# Patient Record
Sex: Female | Born: 1965 | Race: White | Hispanic: No | Marital: Married | Smoking: Current some day smoker
Health system: Southern US, Community
[De-identification: ages and names within clinical notes are randomized; demographics above are authoritative.]

## PROBLEM LIST (undated history)

## (undated) DIAGNOSIS — G43909 Migraine, unspecified, not intractable, without status migrainosus: Secondary | ICD-10-CM

## (undated) DIAGNOSIS — I499 Cardiac arrhythmia, unspecified: Secondary | ICD-10-CM

## (undated) DIAGNOSIS — K222 Esophageal obstruction: Secondary | ICD-10-CM

## (undated) DIAGNOSIS — R112 Nausea with vomiting, unspecified: Secondary | ICD-10-CM

## (undated) DIAGNOSIS — R011 Cardiac murmur, unspecified: Secondary | ICD-10-CM

## (undated) DIAGNOSIS — F419 Anxiety disorder, unspecified: Secondary | ICD-10-CM

## (undated) DIAGNOSIS — M1990 Unspecified osteoarthritis, unspecified site: Secondary | ICD-10-CM

## (undated) DIAGNOSIS — T7840XA Allergy, unspecified, initial encounter: Secondary | ICD-10-CM

## (undated) DIAGNOSIS — J189 Pneumonia, unspecified organism: Secondary | ICD-10-CM

## (undated) DIAGNOSIS — Z9889 Other specified postprocedural states: Secondary | ICD-10-CM

## (undated) DIAGNOSIS — J45909 Unspecified asthma, uncomplicated: Secondary | ICD-10-CM

## (undated) DIAGNOSIS — I341 Nonrheumatic mitral (valve) prolapse: Secondary | ICD-10-CM

## (undated) DIAGNOSIS — K589 Irritable bowel syndrome without diarrhea: Secondary | ICD-10-CM

## (undated) DIAGNOSIS — K279 Peptic ulcer, site unspecified, unspecified as acute or chronic, without hemorrhage or perforation: Secondary | ICD-10-CM

## (undated) DIAGNOSIS — D649 Anemia, unspecified: Secondary | ICD-10-CM

## (undated) DIAGNOSIS — F909 Attention-deficit hyperactivity disorder, unspecified type: Secondary | ICD-10-CM

## (undated) DIAGNOSIS — K219 Gastro-esophageal reflux disease without esophagitis: Secondary | ICD-10-CM

## (undated) DIAGNOSIS — T8859XA Other complications of anesthesia, initial encounter: Secondary | ICD-10-CM

## (undated) DIAGNOSIS — Z8719 Personal history of other diseases of the digestive system: Secondary | ICD-10-CM

## (undated) DIAGNOSIS — C4490 Unspecified malignant neoplasm of skin, unspecified: Secondary | ICD-10-CM

## (undated) DIAGNOSIS — Z923 Personal history of irradiation: Secondary | ICD-10-CM

## (undated) DIAGNOSIS — F32A Depression, unspecified: Secondary | ICD-10-CM

## (undated) DIAGNOSIS — C50919 Malignant neoplasm of unspecified site of unspecified female breast: Secondary | ICD-10-CM

## (undated) DIAGNOSIS — F329 Major depressive disorder, single episode, unspecified: Secondary | ICD-10-CM

## (undated) DIAGNOSIS — T4145XA Adverse effect of unspecified anesthetic, initial encounter: Secondary | ICD-10-CM

## (undated) HISTORY — PX: SHOULDER SURGERY: SHX246

## (undated) HISTORY — PX: CERVIX LESION DESTRUCTION: SHX591

## (undated) HISTORY — PX: CYSTECTOMY: SUR359

## (undated) HISTORY — DX: Cardiac murmur, unspecified: R01.1

## (undated) HISTORY — DX: Gastro-esophageal reflux disease without esophagitis: K21.9

## (undated) HISTORY — PX: HAND SURGERY: SHX662

## (undated) HISTORY — DX: Migraine, unspecified, not intractable, without status migrainosus: G43.909

## (undated) HISTORY — PX: TONSILLECTOMY: SUR1361

## (undated) HISTORY — PX: DILATION AND CURETTAGE OF UTERUS: SHX78

## (undated) HISTORY — PX: NOVASURE ABLATION: SHX5394

## (undated) HISTORY — PX: COLPOSCOPY: SHX161

## (undated) HISTORY — DX: Unspecified osteoarthritis, unspecified site: M19.90

## (undated) HISTORY — DX: Allergy, unspecified, initial encounter: T78.40XA

## (undated) HISTORY — PX: APPENDECTOMY: SHX54

## (undated) HISTORY — PX: BREAST LUMPECTOMY: SHX2

---

## 1998-02-23 ENCOUNTER — Other Ambulatory Visit: Admission: RE | Admit: 1998-02-23 | Discharge: 1998-02-23 | Payer: Self-pay | Admitting: Gynecology

## 1998-10-24 ENCOUNTER — Other Ambulatory Visit: Admission: RE | Admit: 1998-10-24 | Discharge: 1998-10-24 | Payer: Self-pay | Admitting: Gynecology

## 1998-11-27 ENCOUNTER — Emergency Department (HOSPITAL_COMMUNITY): Admission: EM | Admit: 1998-11-27 | Discharge: 1998-11-27 | Payer: Self-pay | Admitting: Emergency Medicine

## 1999-05-18 ENCOUNTER — Ambulatory Visit (HOSPITAL_COMMUNITY): Admission: RE | Admit: 1999-05-18 | Discharge: 1999-05-18 | Payer: Self-pay | Admitting: Obstetrics and Gynecology

## 1999-05-18 ENCOUNTER — Encounter: Payer: Self-pay | Admitting: Obstetrics and Gynecology

## 1999-05-19 ENCOUNTER — Ambulatory Visit (HOSPITAL_COMMUNITY): Admission: AD | Admit: 1999-05-19 | Discharge: 1999-05-19 | Payer: Self-pay | Admitting: Obstetrics and Gynecology

## 1999-05-19 ENCOUNTER — Encounter (INDEPENDENT_AMBULATORY_CARE_PROVIDER_SITE_OTHER): Payer: Self-pay

## 1999-05-22 ENCOUNTER — Emergency Department (HOSPITAL_COMMUNITY): Admission: EM | Admit: 1999-05-22 | Discharge: 1999-05-22 | Payer: Self-pay | Admitting: Emergency Medicine

## 1999-06-20 ENCOUNTER — Emergency Department (HOSPITAL_COMMUNITY): Admission: EM | Admit: 1999-06-20 | Discharge: 1999-06-21 | Payer: Self-pay | Admitting: *Deleted

## 1999-06-28 ENCOUNTER — Ambulatory Visit (HOSPITAL_COMMUNITY): Admission: RE | Admit: 1999-06-28 | Discharge: 1999-06-28 | Payer: Self-pay | Admitting: Internal Medicine

## 1999-10-25 ENCOUNTER — Other Ambulatory Visit: Admission: RE | Admit: 1999-10-25 | Discharge: 1999-10-26 | Payer: Self-pay | Admitting: Addiction Psychiatry

## 1999-11-06 ENCOUNTER — Other Ambulatory Visit: Admission: RE | Admit: 1999-11-06 | Discharge: 1999-11-06 | Payer: Self-pay | Admitting: Obstetrics and Gynecology

## 1999-11-21 ENCOUNTER — Emergency Department (HOSPITAL_COMMUNITY): Admission: EM | Admit: 1999-11-21 | Discharge: 1999-11-22 | Payer: Self-pay | Admitting: Emergency Medicine

## 1999-11-22 ENCOUNTER — Emergency Department (HOSPITAL_COMMUNITY): Admission: EM | Admit: 1999-11-22 | Discharge: 1999-11-22 | Payer: Self-pay | Admitting: Emergency Medicine

## 1999-11-22 ENCOUNTER — Encounter: Payer: Self-pay | Admitting: Emergency Medicine

## 1999-11-29 ENCOUNTER — Encounter: Payer: Self-pay | Admitting: Internal Medicine

## 1999-11-29 ENCOUNTER — Ambulatory Visit (HOSPITAL_COMMUNITY): Admission: RE | Admit: 1999-11-29 | Discharge: 1999-11-29 | Payer: Self-pay | Admitting: Internal Medicine

## 1999-12-06 ENCOUNTER — Encounter (INDEPENDENT_AMBULATORY_CARE_PROVIDER_SITE_OTHER): Payer: Self-pay | Admitting: Specialist

## 1999-12-06 ENCOUNTER — Other Ambulatory Visit: Admission: RE | Admit: 1999-12-06 | Discharge: 1999-12-06 | Payer: Self-pay | Admitting: Internal Medicine

## 2000-02-07 ENCOUNTER — Emergency Department (HOSPITAL_COMMUNITY): Admission: EM | Admit: 2000-02-07 | Discharge: 2000-02-07 | Payer: Self-pay | Admitting: Emergency Medicine

## 2000-02-12 ENCOUNTER — Emergency Department (HOSPITAL_COMMUNITY): Admission: EM | Admit: 2000-02-12 | Discharge: 2000-02-12 | Payer: Self-pay | Admitting: Emergency Medicine

## 2000-02-13 ENCOUNTER — Observation Stay (HOSPITAL_COMMUNITY): Admission: EM | Admit: 2000-02-13 | Discharge: 2000-02-14 | Payer: Self-pay | Admitting: Emergency Medicine

## 2000-09-04 ENCOUNTER — Encounter: Payer: Self-pay | Admitting: Obstetrics and Gynecology

## 2000-09-04 ENCOUNTER — Inpatient Hospital Stay (HOSPITAL_COMMUNITY): Admission: AD | Admit: 2000-09-04 | Discharge: 2000-09-04 | Payer: Self-pay | Admitting: Obstetrics and Gynecology

## 2000-09-09 ENCOUNTER — Inpatient Hospital Stay (HOSPITAL_COMMUNITY): Admission: AD | Admit: 2000-09-09 | Discharge: 2000-09-09 | Payer: Self-pay | Admitting: Obstetrics and Gynecology

## 2000-11-25 ENCOUNTER — Ambulatory Visit (HOSPITAL_COMMUNITY): Admission: RE | Admit: 2000-11-25 | Discharge: 2000-11-25 | Payer: Self-pay | Admitting: Obstetrics and Gynecology

## 2000-11-25 ENCOUNTER — Encounter: Payer: Self-pay | Admitting: Obstetrics and Gynecology

## 2001-01-04 ENCOUNTER — Inpatient Hospital Stay (HOSPITAL_COMMUNITY): Admission: AD | Admit: 2001-01-04 | Discharge: 2001-01-04 | Payer: Self-pay | Admitting: Obstetrics and Gynecology

## 2001-01-16 ENCOUNTER — Encounter (INDEPENDENT_AMBULATORY_CARE_PROVIDER_SITE_OTHER): Payer: Self-pay | Admitting: *Deleted

## 2001-01-16 ENCOUNTER — Ambulatory Visit (HOSPITAL_COMMUNITY): Admission: RE | Admit: 2001-01-16 | Discharge: 2001-01-16 | Payer: Self-pay | Admitting: Obstetrics and Gynecology

## 2001-01-16 ENCOUNTER — Encounter: Payer: Self-pay | Admitting: Obstetrics and Gynecology

## 2001-04-20 ENCOUNTER — Inpatient Hospital Stay (HOSPITAL_COMMUNITY): Admission: AD | Admit: 2001-04-20 | Discharge: 2001-04-23 | Payer: Self-pay | Admitting: Obstetrics and Gynecology

## 2001-04-24 ENCOUNTER — Encounter: Admission: RE | Admit: 2001-04-24 | Discharge: 2001-05-06 | Payer: Self-pay | Admitting: Obstetrics and Gynecology

## 2001-05-02 ENCOUNTER — Inpatient Hospital Stay (HOSPITAL_COMMUNITY): Admission: AD | Admit: 2001-05-02 | Discharge: 2001-05-02 | Payer: Self-pay | Admitting: Obstetrics and Gynecology

## 2001-05-18 ENCOUNTER — Other Ambulatory Visit: Admission: RE | Admit: 2001-05-18 | Discharge: 2001-05-18 | Payer: Self-pay | Admitting: Obstetrics and Gynecology

## 2001-05-25 ENCOUNTER — Encounter: Admission: RE | Admit: 2001-05-25 | Discharge: 2001-06-24 | Payer: Self-pay | Admitting: Obstetrics and Gynecology

## 2001-06-25 ENCOUNTER — Encounter: Admission: RE | Admit: 2001-06-25 | Discharge: 2001-07-25 | Payer: Self-pay | Admitting: Obstetrics and Gynecology

## 2002-04-22 ENCOUNTER — Other Ambulatory Visit: Admission: RE | Admit: 2002-04-22 | Discharge: 2002-04-22 | Payer: Self-pay | Admitting: Obstetrics and Gynecology

## 2002-05-09 ENCOUNTER — Emergency Department (HOSPITAL_COMMUNITY): Admission: EM | Admit: 2002-05-09 | Discharge: 2002-05-09 | Payer: Self-pay | Admitting: Emergency Medicine

## 2002-05-09 ENCOUNTER — Encounter: Payer: Self-pay | Admitting: Emergency Medicine

## 2002-08-04 ENCOUNTER — Emergency Department (HOSPITAL_COMMUNITY): Admission: EM | Admit: 2002-08-04 | Discharge: 2002-08-04 | Payer: Self-pay | Admitting: Emergency Medicine

## 2002-10-24 ENCOUNTER — Emergency Department (HOSPITAL_COMMUNITY): Admission: EM | Admit: 2002-10-24 | Discharge: 2002-10-24 | Payer: Self-pay | Admitting: Emergency Medicine

## 2002-12-19 ENCOUNTER — Encounter: Payer: Self-pay | Admitting: Emergency Medicine

## 2002-12-19 ENCOUNTER — Emergency Department (HOSPITAL_COMMUNITY): Admission: EM | Admit: 2002-12-19 | Discharge: 2002-12-19 | Payer: Self-pay | Admitting: Emergency Medicine

## 2002-12-22 ENCOUNTER — Emergency Department (HOSPITAL_COMMUNITY): Admission: EM | Admit: 2002-12-22 | Discharge: 2002-12-22 | Payer: Self-pay | Admitting: Emergency Medicine

## 2003-01-04 ENCOUNTER — Encounter: Payer: Self-pay | Admitting: Emergency Medicine

## 2003-01-04 ENCOUNTER — Emergency Department (HOSPITAL_COMMUNITY): Admission: EM | Admit: 2003-01-04 | Discharge: 2003-01-04 | Payer: Self-pay | Admitting: Emergency Medicine

## 2003-05-03 ENCOUNTER — Other Ambulatory Visit: Admission: RE | Admit: 2003-05-03 | Discharge: 2003-05-03 | Payer: Self-pay | Admitting: Obstetrics and Gynecology

## 2003-07-12 ENCOUNTER — Ambulatory Visit (HOSPITAL_COMMUNITY): Admission: RE | Admit: 2003-07-12 | Discharge: 2003-07-12 | Payer: Self-pay | Admitting: Internal Medicine

## 2003-11-25 ENCOUNTER — Ambulatory Visit (HOSPITAL_COMMUNITY): Admission: RE | Admit: 2003-11-25 | Discharge: 2003-11-25 | Payer: Self-pay | Admitting: Internal Medicine

## 2003-11-25 ENCOUNTER — Encounter (INDEPENDENT_AMBULATORY_CARE_PROVIDER_SITE_OTHER): Payer: Self-pay | Admitting: *Deleted

## 2003-11-30 ENCOUNTER — Ambulatory Visit (HOSPITAL_COMMUNITY): Admission: RE | Admit: 2003-11-30 | Discharge: 2003-11-30 | Payer: Self-pay | Admitting: Internal Medicine

## 2004-05-17 ENCOUNTER — Other Ambulatory Visit: Admission: RE | Admit: 2004-05-17 | Discharge: 2004-05-17 | Payer: Self-pay | Admitting: Obstetrics and Gynecology

## 2005-05-27 ENCOUNTER — Other Ambulatory Visit: Admission: RE | Admit: 2005-05-27 | Discharge: 2005-05-27 | Payer: Self-pay | Admitting: Obstetrics and Gynecology

## 2005-07-03 ENCOUNTER — Ambulatory Visit: Payer: Self-pay | Admitting: Internal Medicine

## 2005-07-19 ENCOUNTER — Ambulatory Visit: Payer: Self-pay | Admitting: Internal Medicine

## 2005-07-23 ENCOUNTER — Ambulatory Visit: Payer: Self-pay | Admitting: Internal Medicine

## 2005-07-29 ENCOUNTER — Ambulatory Visit: Payer: Self-pay | Admitting: Internal Medicine

## 2005-08-14 ENCOUNTER — Ambulatory Visit: Payer: Self-pay | Admitting: Internal Medicine

## 2005-09-18 ENCOUNTER — Ambulatory Visit: Payer: Self-pay | Admitting: Internal Medicine

## 2005-11-19 ENCOUNTER — Ambulatory Visit: Payer: Self-pay | Admitting: Internal Medicine

## 2005-12-12 ENCOUNTER — Ambulatory Visit: Payer: Self-pay | Admitting: Internal Medicine

## 2006-04-22 ENCOUNTER — Ambulatory Visit: Payer: Self-pay | Admitting: Internal Medicine

## 2006-06-27 ENCOUNTER — Other Ambulatory Visit: Payer: Self-pay

## 2006-06-27 ENCOUNTER — Emergency Department: Payer: Self-pay | Admitting: Emergency Medicine

## 2006-06-30 ENCOUNTER — Ambulatory Visit: Payer: Self-pay | Admitting: Internal Medicine

## 2006-06-30 ENCOUNTER — Ambulatory Visit: Payer: Self-pay | Admitting: Emergency Medicine

## 2006-07-07 ENCOUNTER — Ambulatory Visit: Payer: Self-pay | Admitting: Internal Medicine

## 2006-10-02 ENCOUNTER — Ambulatory Visit: Payer: Self-pay | Admitting: Internal Medicine

## 2006-12-22 ENCOUNTER — Ambulatory Visit: Payer: Self-pay | Admitting: Internal Medicine

## 2007-01-21 ENCOUNTER — Ambulatory Visit: Payer: Self-pay | Admitting: Internal Medicine

## 2007-01-30 ENCOUNTER — Ambulatory Visit: Payer: Self-pay | Admitting: Internal Medicine

## 2007-05-01 DIAGNOSIS — F411 Generalized anxiety disorder: Secondary | ICD-10-CM

## 2007-05-01 DIAGNOSIS — I4949 Other premature depolarization: Secondary | ICD-10-CM | POA: Insufficient documentation

## 2007-05-01 DIAGNOSIS — F419 Anxiety disorder, unspecified: Secondary | ICD-10-CM | POA: Insufficient documentation

## 2007-05-01 DIAGNOSIS — F32A Depression, unspecified: Secondary | ICD-10-CM | POA: Insufficient documentation

## 2007-05-01 DIAGNOSIS — F329 Major depressive disorder, single episode, unspecified: Secondary | ICD-10-CM

## 2007-05-01 DIAGNOSIS — E669 Obesity, unspecified: Secondary | ICD-10-CM | POA: Insufficient documentation

## 2007-05-01 DIAGNOSIS — F410 Panic disorder [episodic paroxysmal anxiety] without agoraphobia: Secondary | ICD-10-CM | POA: Insufficient documentation

## 2007-05-01 DIAGNOSIS — J309 Allergic rhinitis, unspecified: Secondary | ICD-10-CM

## 2007-05-01 DIAGNOSIS — Z8679 Personal history of other diseases of the circulatory system: Secondary | ICD-10-CM | POA: Insufficient documentation

## 2007-05-01 DIAGNOSIS — Z8711 Personal history of peptic ulcer disease: Secondary | ICD-10-CM

## 2007-07-02 ENCOUNTER — Encounter: Payer: Self-pay | Admitting: Internal Medicine

## 2007-07-02 ENCOUNTER — Ambulatory Visit: Payer: Self-pay | Admitting: Internal Medicine

## 2007-07-06 ENCOUNTER — Other Ambulatory Visit (HOSPITAL_COMMUNITY): Admission: RE | Admit: 2007-07-06 | Discharge: 2007-10-04 | Payer: Self-pay | Admitting: Psychiatry

## 2007-07-07 ENCOUNTER — Ambulatory Visit: Payer: Self-pay | Admitting: Psychiatry

## 2007-08-22 ENCOUNTER — Emergency Department (HOSPITAL_COMMUNITY): Admission: EM | Admit: 2007-08-22 | Discharge: 2007-08-22 | Payer: Self-pay | Admitting: Family Medicine

## 2007-08-27 ENCOUNTER — Telehealth: Payer: Self-pay | Admitting: Internal Medicine

## 2007-08-27 ENCOUNTER — Emergency Department (HOSPITAL_COMMUNITY): Admission: EM | Admit: 2007-08-27 | Discharge: 2007-08-27 | Payer: Self-pay | Admitting: Emergency Medicine

## 2007-08-27 ENCOUNTER — Encounter (INDEPENDENT_AMBULATORY_CARE_PROVIDER_SITE_OTHER): Payer: Self-pay | Admitting: *Deleted

## 2007-09-15 ENCOUNTER — Ambulatory Visit: Payer: Self-pay | Admitting: Internal Medicine

## 2007-09-15 ENCOUNTER — Encounter: Payer: Self-pay | Admitting: Internal Medicine

## 2007-09-18 ENCOUNTER — Encounter: Payer: Self-pay | Admitting: Internal Medicine

## 2007-09-21 ENCOUNTER — Ambulatory Visit: Payer: Self-pay | Admitting: Internal Medicine

## 2007-10-20 ENCOUNTER — Ambulatory Visit: Payer: Self-pay | Admitting: Internal Medicine

## 2007-10-20 ENCOUNTER — Encounter: Payer: Self-pay | Admitting: Internal Medicine

## 2007-11-05 ENCOUNTER — Ambulatory Visit: Payer: Self-pay | Admitting: Internal Medicine

## 2007-11-05 LAB — CONVERTED CEMR LAB
Bilirubin Urine: NEGATIVE
Urine Glucose: NEGATIVE mg/dL

## 2007-11-10 ENCOUNTER — Ambulatory Visit (HOSPITAL_COMMUNITY): Admission: RE | Admit: 2007-11-10 | Discharge: 2007-11-10 | Payer: Self-pay | Admitting: Internal Medicine

## 2007-12-12 ENCOUNTER — Emergency Department (HOSPITAL_COMMUNITY): Admission: EM | Admit: 2007-12-12 | Discharge: 2007-12-12 | Payer: Self-pay | Admitting: Emergency Medicine

## 2008-02-08 ENCOUNTER — Telehealth: Payer: Self-pay | Admitting: Internal Medicine

## 2008-02-09 ENCOUNTER — Ambulatory Visit: Payer: Self-pay | Admitting: Internal Medicine

## 2008-02-09 LAB — CONVERTED CEMR LAB
Hemoglobin, Urine: NEGATIVE
Ketones, ur: NEGATIVE mg/dL
Leukocytes, UA: NEGATIVE
Nitrite: NEGATIVE
Urine Glucose: NEGATIVE mg/dL

## 2008-02-10 ENCOUNTER — Telehealth: Payer: Self-pay | Admitting: Internal Medicine

## 2008-03-11 ENCOUNTER — Ambulatory Visit: Payer: Self-pay | Admitting: Internal Medicine

## 2008-03-11 DIAGNOSIS — K279 Peptic ulcer, site unspecified, unspecified as acute or chronic, without hemorrhage or perforation: Secondary | ICD-10-CM | POA: Insufficient documentation

## 2008-03-11 DIAGNOSIS — M2550 Pain in unspecified joint: Secondary | ICD-10-CM

## 2008-03-11 DIAGNOSIS — K222 Esophageal obstruction: Secondary | ICD-10-CM

## 2008-05-13 ENCOUNTER — Ambulatory Visit: Payer: Self-pay | Admitting: Internal Medicine

## 2008-05-13 ENCOUNTER — Telehealth: Payer: Self-pay | Admitting: Internal Medicine

## 2008-05-17 ENCOUNTER — Telehealth: Payer: Self-pay | Admitting: Internal Medicine

## 2008-05-18 ENCOUNTER — Ambulatory Visit: Payer: Self-pay | Admitting: Internal Medicine

## 2008-05-18 ENCOUNTER — Telehealth: Payer: Self-pay | Admitting: Internal Medicine

## 2008-05-19 ENCOUNTER — Telehealth: Payer: Self-pay | Admitting: Internal Medicine

## 2008-05-27 ENCOUNTER — Ambulatory Visit: Payer: Self-pay | Admitting: Internal Medicine

## 2008-05-27 ENCOUNTER — Telehealth: Payer: Self-pay | Admitting: Internal Medicine

## 2008-06-14 ENCOUNTER — Telehealth: Payer: Self-pay | Admitting: Internal Medicine

## 2008-06-21 ENCOUNTER — Encounter: Payer: Self-pay | Admitting: Internal Medicine

## 2008-08-19 ENCOUNTER — Ambulatory Visit: Payer: Self-pay | Admitting: Internal Medicine

## 2008-08-23 ENCOUNTER — Ambulatory Visit: Payer: Self-pay | Admitting: Internal Medicine

## 2008-08-23 DIAGNOSIS — G43909 Migraine, unspecified, not intractable, without status migrainosus: Secondary | ICD-10-CM | POA: Insufficient documentation

## 2008-08-23 LAB — CONVERTED CEMR LAB
ALT: 13 units/L (ref 0–35)
AST: 19 units/L (ref 0–37)
Bilirubin, Direct: 0.1 mg/dL (ref 0.0–0.3)
Eosinophils Relative: 1.2 % (ref 0.0–5.0)
HCT: 39.9 % (ref 36.0–46.0)
HDL: 52.7 mg/dL (ref >39.0)
Hemoglobin: 13.7 g/dL (ref 12.0–15.0)
LDL Cholesterol: 116 mg/dL — ABNORMAL HIGH (ref 0–99)
Lymphocytes Relative: 28.9 % (ref 12.0–46.0)
Monocytes Relative: 6 % (ref 3.0–12.0)
Platelets: 217 10*3/uL (ref 150–400)
RDW: 12.1 % (ref 11.5–14.6)
Total Bilirubin: 0.7 mg/dL (ref 0.3–1.2)
Total CHOL/HDL Ratio: 3.4
Total Protein: 6.9 g/dL (ref 6.0–8.3)
Triglycerides: 47 mg/dL (ref 0–149)
WBC: 5.3 10*3/uL (ref 4.5–10.5)

## 2008-08-25 ENCOUNTER — Encounter: Payer: Self-pay | Admitting: Internal Medicine

## 2008-08-25 ENCOUNTER — Ambulatory Visit: Payer: Self-pay

## 2008-09-21 ENCOUNTER — Telehealth: Payer: Self-pay | Admitting: Internal Medicine

## 2008-09-27 ENCOUNTER — Ambulatory Visit: Payer: Self-pay | Admitting: Internal Medicine

## 2008-09-27 ENCOUNTER — Telehealth: Payer: Self-pay | Admitting: Internal Medicine

## 2008-09-27 DIAGNOSIS — J069 Acute upper respiratory infection, unspecified: Secondary | ICD-10-CM | POA: Insufficient documentation

## 2008-12-21 ENCOUNTER — Telehealth (INDEPENDENT_AMBULATORY_CARE_PROVIDER_SITE_OTHER): Payer: Self-pay | Admitting: *Deleted

## 2008-12-23 ENCOUNTER — Ambulatory Visit: Payer: Self-pay | Admitting: Internal Medicine

## 2008-12-23 DIAGNOSIS — G245 Blepharospasm: Secondary | ICD-10-CM | POA: Insufficient documentation

## 2008-12-27 ENCOUNTER — Telehealth (INDEPENDENT_AMBULATORY_CARE_PROVIDER_SITE_OTHER): Payer: Self-pay | Admitting: *Deleted

## 2009-05-18 ENCOUNTER — Encounter: Payer: Self-pay | Admitting: Internal Medicine

## 2009-09-11 ENCOUNTER — Telehealth: Payer: Self-pay | Admitting: Internal Medicine

## 2009-11-02 ENCOUNTER — Ambulatory Visit: Payer: Self-pay | Admitting: Internal Medicine

## 2009-11-03 ENCOUNTER — Telehealth: Payer: Self-pay | Admitting: Internal Medicine

## 2009-11-06 ENCOUNTER — Encounter: Payer: Self-pay | Admitting: Internal Medicine

## 2009-12-19 ENCOUNTER — Ambulatory Visit: Payer: Self-pay | Admitting: Gastroenterology

## 2009-12-19 ENCOUNTER — Telehealth: Payer: Self-pay | Admitting: Internal Medicine

## 2009-12-19 ENCOUNTER — Encounter: Payer: Self-pay | Admitting: Nurse Practitioner

## 2009-12-19 DIAGNOSIS — K589 Irritable bowel syndrome without diarrhea: Secondary | ICD-10-CM | POA: Insufficient documentation

## 2009-12-19 DIAGNOSIS — K219 Gastro-esophageal reflux disease without esophagitis: Secondary | ICD-10-CM

## 2009-12-21 ENCOUNTER — Telehealth: Payer: Self-pay | Admitting: Nurse Practitioner

## 2010-01-16 ENCOUNTER — Ambulatory Visit (HOSPITAL_COMMUNITY): Admission: RE | Admit: 2010-01-16 | Discharge: 2010-01-16 | Payer: Self-pay | Admitting: Obstetrics and Gynecology

## 2010-01-17 ENCOUNTER — Encounter (INDEPENDENT_AMBULATORY_CARE_PROVIDER_SITE_OTHER): Payer: Self-pay | Admitting: *Deleted

## 2010-01-24 DIAGNOSIS — K449 Diaphragmatic hernia without obstruction or gangrene: Secondary | ICD-10-CM | POA: Insufficient documentation

## 2010-01-24 DIAGNOSIS — K644 Residual hemorrhoidal skin tags: Secondary | ICD-10-CM | POA: Insufficient documentation

## 2010-04-26 ENCOUNTER — Ambulatory Visit: Payer: Self-pay | Admitting: Internal Medicine

## 2010-04-26 DIAGNOSIS — R609 Edema, unspecified: Secondary | ICD-10-CM | POA: Insufficient documentation

## 2010-04-26 LAB — CONVERTED CEMR LAB
BUN: 13 mg/dL (ref 6–23)
Bilirubin Urine: NEGATIVE
CO2: 31 meq/L (ref 19–32)
Calcium: 9.3 mg/dL (ref 8.4–10.5)
Creatinine, Ser: 0.5 mg/dL (ref 0.4–1.2)
GFR calc non Af Amer: 147.89 mL/min (ref >60)
Glucose, Bld: 108 mg/dL — ABNORMAL HIGH (ref 70–99)
Hemoglobin, Urine: NEGATIVE
Ketones, ur: NEGATIVE mg/dL
Specific Gravity, Urine: 1.015 (ref 1.000–1.030)
Urine Glucose: NEGATIVE mg/dL
Urobilinogen, UA: 0.2 (ref 0.0–1.0)

## 2010-04-28 ENCOUNTER — Telehealth: Payer: Self-pay | Admitting: Internal Medicine

## 2010-06-07 ENCOUNTER — Ambulatory Visit: Payer: Self-pay | Admitting: Internal Medicine

## 2010-09-07 ENCOUNTER — Ambulatory Visit: Payer: Self-pay | Admitting: Endocrinology

## 2010-09-07 DIAGNOSIS — M25559 Pain in unspecified hip: Secondary | ICD-10-CM | POA: Insufficient documentation

## 2010-10-10 ENCOUNTER — Ambulatory Visit
Admission: RE | Admit: 2010-10-10 | Discharge: 2010-10-10 | Payer: Self-pay | Source: Home / Self Care | Attending: Internal Medicine | Admitting: Internal Medicine

## 2010-10-28 ENCOUNTER — Encounter: Payer: Self-pay | Admitting: Obstetrics and Gynecology

## 2010-10-28 ENCOUNTER — Encounter: Payer: Self-pay | Admitting: Gastroenterology

## 2010-11-06 NOTE — Progress Notes (Signed)
  Phone Note Outgoing Call   Reason for Call: Discuss lab or test results Summary of Call: please call patient: U/A weakly positive looking more like a poor catch (not a clean catch). Is she symptomatic?  Thanks Initial call taken by: Jacques Navy MD,  April 28, 2010 12:51 PM  Follow-up for Phone Call        pt states she is having a little bit of urinary freq. She is not having burning but states she does feel like she has the urge to go quite a bit. Follow-up by: Ami Bullins CMA,  April 30, 2010 9:33 AM  Additional Follow-up for Phone Call Additional follow up Details #1::        macrobid 100mg  two times a day x 5, #10, no refills Additional Follow-up by: Jacques Navy MD,  April 30, 2010 10:41 AM    New/Updated Medications: MACROBID 100 MG CAPS (NITROFURANTOIN MONOHYD MACRO) 1 cap two times a day x 10 days Prescriptions: MACROBID 100 MG CAPS (NITROFURANTOIN MONOHYD MACRO) 1 cap two times a day x 10 days  #10 x 0   Entered by:   Ami Bullins CMA   Authorized by:   Jacques Navy MD   Signed by:   Bill Salinas CMA on 04/30/2010   Method used:   [facility]  [address] [phone]       [address]       [place], [place]  [id number]       Ph: [phone] or [phone]       Fax: [phone]   RxID:   [id number]

## 2010-11-06 NOTE — Letter (Signed)
Summary: [facility]   Imported By: Sherian Rein 11/29/2009 15:12:33  _____________________________________________________________________  External Attachment:    Type:   Image     Comment:   External Document

## 2010-11-06 NOTE — Procedures (Signed)
Summary: COLON   Colonoscopy  Procedure date:  01/30/2007  Findings:      [facility]:   [facility]    Procedures Next Due Date:    Colonoscopy: 02/2014 Patient Name: Tamara Cross, Tamara l. MRN: [id number] Procedure Procedures: Colonoscopy CPT: [phone].  Personnel: Endoscopist: Felice Hope L. Juanda Chance, MD.  Exam Location: Exam performed in Outpatient Clinic. Outpatient  Patient Consent: Procedure, Alternatives, Risks and Benefits discussed, consent obtained, from patient. Consent was obtained by the RN.  Indications  Surveillance of: 2001.  Increased Risk Screening: For family history of colorectal neoplasia, in  grandparent 2  maternal uncles 91 maternal aunts and uncles had colon polyps Family History of Polyps.  History  Current Medications: Patient is not currently taking Coumadin.  Pre-Exam Physical: Performed Jan 30, 2007. Cardio-pulmonary exam, HEENT exam , Abdominal exam, Extremity exam, Neurological exam, Mental status exam WNL.  Comments: Pt. history reviewed/updated, physical exam performed prior to initiation of sedation? Exam Exam: Extent of exam reached: Cecum, extent intended: Cecum.  The cecum was identified by appendiceal orifice and IC valve. Colon retroflexion performed. Images taken. ASA Classification: I. Tolerance: good.  Monitoring: Pulse and BP monitoring, Oximetry used. Supplemental O2 given.  Colon Prep Used Miralax for colon prep. Prep results: good.  Sedation Meds: Patient assessed and found to be appropriate for moderate (conscious) sedation. Fentanyl 100 mcg. given IV. Versed 9 mg. given IV.  Findings - NORMAL EXAM: to Rectum.  - HEMORRHOIDS: External. Size: Grade I. ICD9: Hemorrhoids, External: 455.3.   Assessment Normal examination.  Diagnoses: 455.3: Hemorrhoids, External.   Comments: no polyps Events  Unplanned Interventions: No intervention was required.  Unplanned Events: There were no complications. Plans Medication  Plan: Analpram cream 2.5 %: prn, starting Jan 30, 2007   Patient Education: Patient given standard instructions for: Patient instructed to get routine colonoscopy every 7 years.  Disposition: After procedure patient sent to recovery. After recovery patient sent home.   This report was created from the original endoscopy report, which was reviewed and signed by the above listed endoscopist.

## 2010-11-06 NOTE — Assessment & Plan Note (Signed)
Summary: pain left hip/groin area pain,lump/men/cd   Vital Signs:  Patient profile:   46 year old female Height:      65 inches (165.10 cm) Weight:      203 pounds (92.27 kg) BMI:     33.90 O2 Sat:      96 % on Room air Temp:     98.4 degrees F (36.89 degrees C) oral Pulse rate:   77 / minute Pulse rhythm:   regular BP sitting:   104 / 68  (left arm) Cuff size:   regular  Vitals Entered By: Brenton Grills CMA Duncan Dull) (September 07, 2010 3:16 PM)  O2 Flow:  Room air CC: Left hip pain, groin pain on left side x 2 weeks, Bump in groin area/OTC meds not helping/aj Is Patient Diabetic? No Comments pt is no longer taking Marlan Palau, or Amitiza   Primary Provider:  Illene Regulus, MD  CC:  Left hip pain, groin pain on left side x 2 weeks, and Bump in groin area/OTC meds not helping/aj.  History of Present Illness: 2 weeks of intermittent moderate pain at the left anterior thigh, and 2 assoc "bumps,"  there.  no injury.  pain is exacerbated by crossing left leg, or by putting weight on the left lower extremity.  no help with tylenol, advil, aleve, or motrin.    Current Medications (verified): 1)  Toprol Xl 50 Mg  Tb24 (Metoprolol Succinate) .... Once Daily 2)  Astelin 137 Mcg/spray  Soln (Azelastine Hcl) .... Once Daily As Needed 3)  Nasonex 50 Mcg/act  Susp (Mometasone Furoate) .... 2 Sprays Once Daily As Needed 4)  Miralax   Powd (Polyethylene Glycol 3350) .... Once Daily As Needed 5)  Pristiq 50 Mg Xr24h-Tab (Desvenlafaxine Succinate) .... Take 1 Tablet By Mouth Every Morning 6)  Lamictal 25 Mg Tabs (Lamotrigine) .... Take 2 Tablet By Mouth Once A Day 7)  Seroquel 25 Mg Tabs (Quetiapine Fumarate) .... Take 1 Tab By Mouth At Bedtime 8)  Clonazepam 0.5 Mg Tabs (Clonazepam) .... Take 1 Tablet By Mouth Once A Day 9)  Zantac 300 Mg Tabs (Ranitidine Hcl) .... Take 1 Tablet By Mouth Once A Day 10)  Analpram-Hc 1-2.5 % Crea (Hydrocortisone Ace-Pramoxine) .... Apply To Rectum 2-3 Times  Per Day As Needed....if Further Refill Needed, Please Call Office! 11)  Lysteda 650 Mg Tabs (Tranexamic Acid) .... 2 Tablets By Mouth Three Times A Day 12)  Iron 90 (18 Fe) Mg Tabs (Ferrous Sulfate) .[name].. 1 Tablet By Mouth Once Daily 13)  Bentyl 10 Mg Caps (Dicyclomine Hcl) .... Take 1 Tab Twice Daily As Needed For Cramping and Spasms 14)  Amitiza 8 Mcg Caps (Lubiprostone) .... Take 1 Tab Twice Daily  Allergies (verified): 1)  ! Sulfa 2)  ! 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         denies numbness and rash  Physical Exam  General:  normal appearance.   Msk:  left thigh:  there are 2 soft-tissue masses.  1 is at the proximal thigh, and 1 distal.  both are anterior.  the distal is 4 cm, and the promimal is 1-2 cm.  both are nontender.   full rom of the left hip, without pain.     Impression & Recommendations:  Problem # 1:  HIP PAIN (ICD-719.45) and pain at adjacent thigh uncertain etiology  Other Orders: Orthopedic Surgeon Referral (Ortho Surgeon) T-Hip Comp Left Min 2-views (73510TC) Est. Patient Level III (84132)  Patient Instructions: 1)  check x-ray.  please call [phone] to hear your test results. 2)  refer dr Simonne Come.  you will be called with a day and time for an appointment 3)  here is a sample of "voltaren" gel.  apply to the painful area three times a day as needed for pain.   Orders Added: 1)  Orthopedic Surgeon Referral [Ortho Surgeon] 2)  T-Hip Comp Left  Min 2-views [73510TC] 3)  Est. Patient Level III [25366]

## 2010-11-06 NOTE — Progress Notes (Signed)
Summary: TRIAGE-SEVERE PAIN   Phone Note Call from Patient Call back at [phone]   Caller: Patient Call For: Magaret Justo Reason for Call: Talk to Nurse Summary of Call: Patient states that she has a lot of pain in her rectum, states that when she passes gas it doubles her over with pain. Patient went to obgyn and was told by them that she needed to see Dr Juanda Chance asap. Initial call taken by: Tawni Levy,  December 19, 2009 9:10 AM  Follow-up for Phone Call         She was seen by her OBGYN. yesterday for heavy vaginal bleeding. She woke up with severe abd/rectal pain this morning. Pt. has had pain since Friday, it is getting worse. Called her OBGYN, was told to see GI MD ASAP.  Pt. will see Willette Cluster NP today at 3pm. Follow-up by: Laureen Ochs LPN,  December 19, 2009 10:04 AM

## 2010-11-06 NOTE — Op Note (Signed)
Summary: D & C    Tamara Cross, Tamara Cross NO.:  [id number]      MEDICAL RECORD NO.:  [id number]          PATIENT TYPE:  AMB      LOCATION:  SDC                           FACILITY:  [facility]      PHYSICIAN:  Malva Limes, M.D.    DATE OF BIRTH:  02/05/1966      DATE OF PROCEDURE:   DATE OF DISCHARGE:  01/16/2010                                  OPERATIVE REPORT      PREOPERATIVE DIAGNOSIS:  Menorrhagia.      POSTOPERATIVE DIAGNOSIS:  Menorrhagia.      POST PROCEDURE:  Dilation and curettage.      SURGEON:  Malva Limes, M.D.      ANESTHESIA:  MAC with paracervical block.      DRAINS:  Red rubber catheter to bladder.      SPECIMENS:  Endometrial curettings sent to pathology.      ANTIBIOTIC:  Ancef 1 g.      COMPLICATIONS:  None.      ESTIMATED BLOOD LOSS:  30 mL.      PROCEDURE:  The patient was taken to the operating room, where she was   placed in dorsal supine position.  The patient was given a MAC   anesthetic.  She was then placed in dorsal lithotomy position.  She was   prepped and draped in the usual fashion for this procedure.  Her bladder   was drained with a red rubber catheter and exam under anesthesia   revealed a normal-sized uterus and normal shaped uterus.  At this point,   80 mL of 1% lidocaine was used for paracervical block.  Single-tooth   tenaculum was applied to the anterior cervical lip.  The uterus was   sounded to 8 cm.  The cervix was serially dilated to a 31-French.  Sharp   curettage was then performed with tissue being sent to pathology.   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  The patient had not been preoped for hysteroscopy.  This was   not performed.  After the curettage was performed, the patient was   awakened and taken to recovery room in stable condition.  Instrument and   lap counts were correct x1.  The patient  was discharged to home.  She   will follow up in the office in 4 weeks.                  ______________________________   Malva Limes, M.D.            MA/MEDQ  D:  01/16/2010  T:  01/17/2010  Job:  [id number]      Electronically Signed by Malva Limes M.D. on 01/20/2010 08:16:06 AM

## 2010-11-06 NOTE — Progress Notes (Signed)
Summary: CALL A NURSE  Phone Note From Other Clinic   Summary of Call: CALL A NURSE:  Pt was seen in office but rx's were not at pharm. On call Dr signed rx's thru EMR.  Initial call taken by: Lamar Sprinkles, CMA,  November 03, 2009 5:44 PM

## 2010-11-06 NOTE — Assessment & Plan Note (Signed)
Summary: BACK PAIN FEEL LIKE POPPING---STC   Vital Signs:  Patient profile:   45 year old female Height:      65 inches Weight:      197 pounds BMI:     32.90 O2 Sat:      97 % on Room air Temp:     98.7 degrees F oral Pulse rate:   67 / minute BP sitting:   104 / 62  (left arm) Cuff size:   regular  Vitals Entered By: Brenton Grills  (November 02, 2009 4:21 PM)  O2 Flow:  Room air CC: Pt complains of lower back pain while walking. Pt feels popping sensation when walking forward with left foot./aj   Primary Care Provider:  Deandrae Wajda  CC:  Pt complains of lower back pain while walking. Pt feels popping sensation when walking forward with left foot./aj.  History of Present Illness: Had the on-set of severe low back pain this AM. She has had progressive pain through the day to the point where she cannot stand up straight without pain. she has had no relief with ibuprofen 800mg . No paresthesia, no incontinence bowel or bladder, no focal weakness. No history of back surgery or major back problems. No recent history of lifting or strain.   Current Medications (verified): 1)  Toprol Xl 50 Mg  Tb24 (Metoprolol Succinate) .... Once Daily 2)  Astelin 137 Mcg/spray  Soln (Azelastine Hcl) .... Once Daily As Needed 3)  Nasonex 50 Mcg/act  Susp (Mometasone Furoate) .... 2 Sprays Once Daily As Needed 4)  Miralax   Powd (Polyethylene Glycol 3350) .... Once Daily As Needed 5)  Pristiq 50 Mg Xr24h-Tab (Desvenlafaxine Succinate) .... Take 1 Tablet By Mouth Every Morning 6)  Lamictal 25 Mg Tabs (Lamotrigine) .... Take 2 Tablet By Mouth Once A Day 7)  Seroquel 25 Mg Tabs (Quetiapine Fumarate) .... Take 1 Tab By Mouth At Bedtime 8)  Clonazepam 0.5 Mg Tabs (Clonazepam) .... Take 1 Tablet By Mouth Once A Day 9)  Zantac 300 Mg Tabs (Ranitidine Hcl) .... Take 1 Tablet By Mouth Once A Day 10)  Analpram-Hc 1-2.5 % Crea (Hydrocortisone Ace-Pramoxine) .... Apply To Rectum 2-3 Times Per Day As Needed....if  Further Refill Needed, Please Call Office!  Allergies (verified): 1)  ! Sulfa 2)  ! 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 Married '99 1 son - '02 Work - unemployed (lost job '08) Marriage in good health No history  of physical or sexual abuse.  Risk Factors: Caffeine Use: 0 (08/23/2008) Exercise: yes (08/19/2008)  Risk Factors: Smoking Status: quit (03/11/2008) Passive Smoke Exposure: no (08/19/2008) PSH reviewed for relevance, FH reviewed for relevance  Review of Systems       The patient complains of difficulty walking.  The patient denies anorexia, fever, weight loss, weight gain, decreased hearing, hoarseness, syncope,  peripheral edema, prolonged cough, headaches, abdominal pain, hematochezia, muscle weakness, and unusual weight change.    Physical Exam  General:  Well-developed,well-nourished,in no acute distress; alert,appropriate and cooperative throughout examination Lungs:  normal respiratory effort and normal breath sounds.   Heart:  normal rate and regular rhythm.   Msk:  back exam:  nl stand, flex, gait, pain with toe walk, normal heel walk, normal step up, nl DTR patellar tendon, nl SLR sitting, no CVAT tenderness   Detailed Back/Spine Exam  General:    Well-developed, well-nourished, in no acute distress; alert and oriented x 3.    Gait:    Normal heel-toe gait pattern bilaterally.    Inspection:    plantigrade foot with normal alignment of leg, ankle, hindfoot, and foot.    Impression & Recommendations:  Problem # 1:  BACK PAIN, LUMBAR (ICD-724.2) No radiculopathy on exam. Suspect that this is a low back strain with muscle spasm.  Plan cyclobenzaprine 5 mg 1-2 three times a day for muscle spasm.        Meloxicam 54m once a day.  The following medications were removed from the medication list:    Flexeril 5 Mg Tabs (Cyclobenzaprine hcl) .[name]... 1-2  by mouth three times a day as needed Her updated medication list for this problem includes:    Meloxicam 15 Mg Tabs (Meloxicam) .[name]... 1 by mouth once daily    Cyclobenzaprine Hcl 5 Mg Tabs (Cyclobenzaprine hcl) .[name]... 1 or 2 tabs three times a day for back spasm  Complete Medication List: 1)  Toprol Xl 50 Mg Tb24 (Metoprolol succinate) .... Once daily 2)  Astelin 137 Mcg/spray Soln (Azelastine hcl) .... Once daily as needed 3)  Nasonex 50 Mcg/act Susp (Mometasone furoate) .... 2 sprays once daily as needed 4)  Miralax Powd (Polyethylene glycol 3350) .... Once daily as needed 5)  Pristiq 50 Mg Xr24h-tab (Desvenlafaxine succinate) .... Take 1 tablet by mouth every morning 6)  Lamictal 25 Mg Tabs (Lamotrigine) .... Take 2 tablet by mouth once a day 7)  Seroquel 25 Mg Tabs (Quetiapine fumarate) .... Take 1 tab by mouth at bedtime 8)  Clonazepam 0.5 Mg Tabs (Clonazepam) .... Take 1 tablet by mouth once a day 9)  Zantac 300 Mg Tabs (Ranitidine hcl) .... Take 1 tablet by mouth once a day 10)  Analpram-hc 1-2.5 % Crea (Hydrocortisone ace-pramoxine) .... Apply to rectum 2-3 times per day as needed....if further refill  needed, please call office! 11)  Meloxicam 15 Mg Tabs (Meloxicam) .[name].. 1 by mouth once daily 12)  Cyclobenzaprine Hcl 5 Mg Tabs (Cyclobenzaprine hcl) .[name].. 1 or 2 tabs three times a day for back spasm Prescriptions: CYCLOBENZAPRINE HCL 5 MG TABS (CYCLOBENZAPRINE HCL) 1 or 2 tabs three times a day for back spasm  #60 x 0   Entered and Authorized by:   Jacques Navy MD   Signed by:   Kerby Nora MD on 11/02/2009   Method used:   [facility]  [address] [phone][facility]       [address]       [place], [place]  [id number]       Ph: [phone] or [phone]       Fax: [phone]   RxID:   [id number] MELOXICAM 15 MG TABS (MELOXICAM) 1 by mouth once daily  #15 x 1   Entered and Authorized by:   Rosalyn Gess  Yaphet Smethurst MD   Signed by:   Kerby Nora MD on 11/02/2009   Method used:   [facility]        [facility]  [address] [phone]* (retail)       [address]       [place], [place]  [address]       Ph: [phone] or [phone]       Fax: [phone]   RxID:   [id number]

## 2010-11-06 NOTE — Progress Notes (Signed)
Summary: Cancel CT Scan please   Phone Note Call from Patient Call back at Work Phone [phone]   Call For: Willette Cluster, NP Summary of Call: Was told that if the medication worked she can cancel CT Scan tomorrow. Is working and would like her xray cancelled please. Initial call taken by: Leanor Kail [facility],  December 21, 2009 9:01 AM  Follow-up for Phone Call        Called [place] and cancelled the CT Scan . Pt is feeling much better and the medication has helped.  I spoke to the pt and she is doing much better , the medication is helping. Follow-up by: Joselyn Glassman,  December 21, 2009 11:58 AM

## 2010-11-06 NOTE — Assessment & Plan Note (Signed)
Summary: HEADACHES/NWS   Vital Signs:  Patient profile:   45 year old female Height:      65 inches Weight:      199 pounds BMI:     33.24 O2 Sat:      99 % on Room air Temp:     98.1 degrees F oral Pulse rate:   81 / minute BP sitting:   108 / 72  (left arm) Cuff size:   regular  Vitals Entered By: Bill Salinas CMA (June 07, 2010 4:05 PM)  O2 Flow:  Room air CC: pt c/o migraine x 4 days with sensitivity to light and sound , pt also c/o nausea/ ab   Primary Care Provider:  Illene Regulus, MD  CC:  pt c/o migraine x 4 days with sensitivity to light and sound  and pt also c/o nausea/ ab.  History of Present Illness: Patient presents with a headache that has lasted 3 days. She is able to sleep but awaens with headach which continues through the day. She describes a pain in the periorbital area and across her forhead. She has mild photophobia, mild nause without emesis, no facial paresthesia. She reports this is simialr to rpevious headaches diagnosed as migraines. She has seen Dr. Clarisse Gouge in the past   Current Medications (verified): 1)  Toprol Xl 50 Mg  Tb24 (Metoprolol Succinate) .... Once Daily 2)  Astelin 137 Mcg/spray  Soln (Azelastine Hcl) .... Once Daily As Needed 3)  Nasonex 50 Mcg/act  Susp (Mometasone Furoate) .... 2 Sprays Once Daily As Needed 4)  Miralax   Powd (Polyethylene Glycol 3350) .... Once Daily As Needed 5)  Pristiq 50 Mg Xr24h-Tab (Desvenlafaxine Succinate) .... Take 1 Tablet By Mouth Every Morning 6)  Lamictal 25 Mg Tabs (Lamotrigine) .... Take 2 Tablet By Mouth Once A Day 7)  Seroquel 25 Mg Tabs (Quetiapine Fumarate) .... Take 1 Tab By Mouth At Bedtime 8)  Clonazepam 0.5 Mg Tabs (Clonazepam) .... Take 1 Tablet By Mouth Once A Day 9)  Zantac 300 Mg Tabs (Ranitidine Hcl) .... Take 1 Tablet By Mouth Once A Day 10)  Analpram-Hc 1-2.5 % Crea (Hydrocortisone Ace-Pramoxine) .... Apply To Rectum 2-3 Times Per Day As Needed....if Further Refill Needed, Please Call  Office! 11)  Lysteda 650 Mg Tabs (Tranexamic Acid) .... 2 Tablets By Mouth Three Times A Day 12)  Iron 90 (18 Fe) Mg Tabs (Ferrous Sulfate) .[name].. 1 Tablet By Mouth Once Daily 13)  Bentyl 10 Mg Caps (Dicyclomine Hcl) .... Take 1 Tab Twice Daily As Needed For Cramping and Spasms 14)  Amitiza 8 Mcg Caps (Lubiprostone) .... Take 1 Tab Twice Daily  Allergies (verified): 1)  ! Sulfa 2)  ! 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) ESOPHAGEAL STRICTURE (ICD-530.3) PEPTIC ULCER DISEASE (ICD-533.90) DEPRESSION (ICD-311) ANXIETY (ICD-300.00) ALLERGIC RHINITIS (ICD-477.9) PREMATURE VENTRICULAR CONTRACTIONS (ICD-427.69) MITRAL VALVE PROLAPSE, HX OF (ICD-V12.50) IRRITABLE BOWEL SYNDROME, HX OF (ICD-V12.79) PUD, HX OF (ICD-V12.71) CRAMP IN LIMB (ICD-729.82) OBESITY, MODERATE (ICD-278.00)  Past Surgical History: Last updated: 01/24/2010 Appendectomy Tonsillectomy s/p cervix colposcopy c-section x 1 Left breast cystectomy PSH reviewed for relevance, FH reviewed for relevance  Review of Systems       The patient complains of headaches.  The patient denies anorexia, fever, weight loss, weight gain, chest pain, syncope, dyspnea on exertion, abdominal pain, severe indigestion/heartburn, incontinence, transient blindness, and enlarged lymph nodes.    Physical Exam  General:  WNWD white  female in no acute distress. Head:  normocephalic and atraumatic.   Eyes:  vision grossly intact, pupils equal, pupils round, pupils reactive to light, and corneas and lenses clear.   Neck:  supple.   Lungs:  normal respiratory effort and normal breath sounds.   Heart:  normal rate and regular rhythm.   Abdomen:  soft and non-tender.   Pulses:  2+ radial pulse Extremities:  No clubbing, cyanosis, edema, or deformity noted with normal full range of motion of all joints.   Neurologic:  alert & oriented X3, cranial  nerves II-XII intact, strength normal in all extremities, and gait normal.   Skin:  turgor normal and color normal.   Cervical Nodes:  no anterior cervical adenopathy and no posterior cervical adenopathy.   Psych:  Oriented X3, memory intact for recent and remote, and normally interactive.     Impression & Recommendations:  Problem # 1:  MIGRAINE HEADACHE (ICD-346.90) Patinet presenting with a headache which she states is her typical migrain.  Plan - sumatriptan 100mg  admiinistered with needles system to left thigh.  Patient had marked pain with injection. She developed pressure in her chest and tachycardia, facial pressure, discomfort in the left arm and nausea. She was closely monitored for 45 minutes. She did have transient elevation in Blood pressure which returned to baseline. By 45 minutes she was feeling bettter without chest pressure. She had a stuffy feeling in her sinus. she felt washed out and was going to take to her bed when she got home. Her headache pain was resolved.  Her updated medication list for this problem includes:    Toprol Xl 50 Mg Tb24 (Metoprolol succinate) ..... Once daily  Complete Medication List: 1)  Toprol Xl 50 Mg Tb24 (Metoprolol succinate) .... Once daily 2)  Astelin 137 Mcg/spray Soln (Azelastine hcl) .... Once daily as needed 3)  Nasonex 50 Mcg/act Susp (Mometasone furoate) .... 2 sprays once daily as needed 4)  Miralax Powd (Polyethylene glycol 3350) .... Once daily as needed 5)  Pristiq 50 Mg Xr24h-tab (Desvenlafaxine succinate) .... Take 1 tablet by mouth every morning 6)  Lamictal 25 Mg Tabs (Lamotrigine) .... Take 2 tablet by mouth once a day 7)  Seroquel 25 Mg Tabs (Quetiapine fumarate) .... Take 1 tab by mouth at bedtime 8)  Clonazepam 0.5 Mg Tabs (Clonazepam) .... Take 1 tablet by mouth once a day 9)  Zantac 300 Mg Tabs (Ranitidine hcl) .... Take 1 tablet by mouth once a day 10)  Analpram-hc 1-2.5 % Crea (Hydrocortisone ace-pramoxine) .... Apply  to rectum 2-3 times per day as needed....if further refill needed, please call office! 11)  Lysteda 650 Mg Tabs (Tranexamic acid) .... 2 tablets by mouth three times a day 12)  Iron 90 (18 Fe) Mg Tabs (Ferrous sulfate) .[name].. 1 tablet by mouth once daily 13)  Bentyl 10 Mg Caps (Dicyclomine hcl) .... Take 1 tab twice daily as needed for cramping and spasms 14)  Amitiza 8 Mcg Caps (Lubiprostone) .... Take 1 tab twice daily

## 2010-11-06 NOTE — Letter (Signed)
Summary: Call A Nurse  Call A Nurse   Imported By: Sherian Rein 11/07/2009 11:14:54  _____________________________________________________________________  External Attachment:    Type:   Image     Comment:   External Document

## 2010-11-06 NOTE — Assessment & Plan Note (Signed)
Summary: swollen foot   Vital Signs:  Patient profile:   45 year old female Height:      65 inches Weight:      200 pounds BMI:     33.40 O2 Sat:      97 % on Room air Temp:     97.9 degrees F oral Pulse rate:   66 / minute BP sitting:   118 / 72  (left arm) Cuff size:   regular  Vitals Entered By: Bill Salinas CMA (April 26, 2010 8:40 AM)  O2 Flow:  Room air CC: pt here with c/o swollen right ankle. She states she notices swelling mainly in the afternoon/ ab   Primary Care Provider:  Illene Regulus, MD  CC:  pt here with c/o swollen right ankle. She states she notices swelling mainly in the afternoon/ ab.  History of Present Illness: Patient has had pedal edema on the right more than the left. She iced the ankle and had it elevated. The swelling will develope during the day. She had some discomfort and a sense of tightness in the foot.   Current Medications (verified): 1)  Toprol Xl 50 Mg  Tb24 (Metoprolol Succinate) .... Once Daily 2)  Astelin 137 Mcg/spray  Soln (Azelastine Hcl) .... Once Daily As Needed 3)  Nasonex 50 Mcg/act  Susp (Mometasone Furoate) .... 2 Sprays Once Daily As Needed 4)  Miralax   Powd (Polyethylene Glycol 3350) .... Once Daily As Needed 5)  Pristiq 50 Mg Xr24h-Tab (Desvenlafaxine Succinate) .... Take 1 Tablet By Mouth Every Morning 6)  Lamictal 25 Mg Tabs (Lamotrigine) .... Take 2 Tablet By Mouth Once A Day 7)  Seroquel 25 Mg Tabs (Quetiapine Fumarate) .... Take 1 Tab By Mouth At Bedtime 8)  Clonazepam 0.5 Mg Tabs (Clonazepam) .... Take 1 Tablet By Mouth Once A Day 9)  Zantac 300 Mg Tabs (Ranitidine Hcl) .... Take 1 Tablet By Mouth Once A Day 10)  Analpram-Hc 1-2.5 % Crea (Hydrocortisone Ace-Pramoxine) .... Apply To Rectum 2-3 Times Per Day As Needed....if Further Refill Needed, Please Call Office! 11)  Lysteda 650 Mg Tabs (Tranexamic Acid) .... 2 Tablets By Mouth Three Times A Day 12)  Iron 90 (18 Fe) Mg Tabs (Ferrous Sulfate) .[name].. 1 Tablet By Mouth  Once Daily 13)  Bentyl 10 Mg Caps (Dicyclomine Hcl) .... Take 1 Tab Twice Daily As Needed For Cramping and Spasms 14)  Amitiza 8 Mcg Caps (Lubiprostone) .... Take 1 Tab Twice Daily  Allergies (verified): 1)  ! Sulfa 2)  ! Augmentin PMH-FH-SH reviewed-no changes except otherwise noted  Review of Systems       The patient complains of incontinence.  The patient denies anorexia, fever, weight loss, weight gain, decreased hearing, chest pain, dyspnea on exertion, abdominal pain, severe indigestion/heartburn, muscle weakness, enlarged lymph nodes, and angioedema.    Physical Exam  General:  Well-developed,well-nourished,in no acute distress; alert,appropriate and cooperative throughout examination Head:  Normocephalic and atraumatic without obvious abnormalities. No apparent alopecia or balding. Lungs:  normal respiratory effort.   Heart:  normal rate and regular rhythm.   Msk:  normal withojut deformity Extremities:  no edema on exam Neurologic:  alert & oriented X3, cranial nerves II-XII intact, and gait normal.   Skin:  turgor normal, color normal, no rashes, no suspicious lesions, and no petechiae.   Psych:  Oriented X3.     Impression & Recommendations:  Problem # 1:  PEDAL EDEMA (ICD-782.3) Most likely venous insufficiency.  Plan - Metabolic  panel to r/o renal origin          elevate legs, use support stockings  Orders: TLB-BMP (Basic Metabolic Panel-BMET) (80048-METABOL)  Problem # 2:  FREQUENCY, URINARY (ICD-788.41) R/O UTI Kegel  Orders: TLB-Udip w/ Micro (81001-URINE)  Complete Medication List: 1)  Toprol Xl 50 Mg Tb24 (Metoprolol succinate) .... Once daily 2)  Astelin 137 Mcg/spray Soln (Azelastine hcl) .... Once daily as needed 3)  Nasonex 50 Mcg/act Susp (Mometasone furoate) .... 2 sprays once daily as needed 4)  Miralax Powd (Polyethylene glycol 3350) .... Once daily as needed 5)  Pristiq 50 Mg Xr24h-tab (Desvenlafaxine succinate) .... Take 1 tablet by mouth  every morning 6)  Lamictal 25 Mg Tabs (Lamotrigine) .... Take 2 tablet by mouth once a day 7)  Seroquel 25 Mg Tabs (Quetiapine fumarate) .... Take 1 tab by mouth at bedtime 8)  Clonazepam 0.5 Mg Tabs (Clonazepam) .... Take 1 tablet by mouth once a day 9)  Zantac 300 Mg Tabs (Ranitidine hcl) .... Take 1 tablet by mouth once a day 10)  Analpram-hc 1-2.5 % Crea (Hydrocortisone ace-pramoxine) .... Apply to rectum 2-3 times per day as needed....if further refill needed, please call office! 11)  Lysteda 650 Mg Tabs (Tranexamic acid) .... 2 tablets by mouth three times a day 12)  Iron 90 (18 Fe) Mg Tabs (Ferrous sulfate) .[name].. 1 tablet by mouth once daily 13)  Bentyl 10 Mg Caps (Dicyclomine hcl) .... Take 1 tab twice daily as needed for cramping and spasms 14)  Amitiza 8 Mcg Caps (Lubiprostone) .... Take 1 tab twice daily

## 2010-11-06 NOTE — Assessment & Plan Note (Signed)
Summary: SEVERE ABD/RECTALPAIN, GETTING WORSE   (DR.BRODIE PT.) Tamara Cross    History of Present Illness Visit Type: Follow-up Visit Primary GI MD: Lina Sar MD Primary Provider: Illene Regulus, MD Chief Complaint: abdominal pain and rectal pain which is getting worse.   History of Present Illness:   Folllowed by Dr. Juanda Chance for IBS, remote PUD. Here for four week history of abdominal cramps, bloating. She has never has lower abdominal cramps this bad. Cramps are not related to meals or defecation. No fevers. She can feel intestinal gas and stool moving down her intestines. Has very loud bowel sounds. Uses Miralax 2-3 a week which helps constipation. Feels she could take Miralax everyday but then it would cause loose stool.  She is able to evacuate her bowels but after defecation feels like something is stuck in her intestines. Decreased appetitie, she is very tired lately.    Her acid reflux is back. She takes Prevacid as needed and lately taking more frequent.    GI Review of Systems    Reports abdominal pain, bloating, and  heartburn.      Denies acid reflux, belching, chest pain, dysphagia with liquids, dysphagia with solids, loss of appetite, nausea, vomiting, vomiting blood, weight loss, and  weight gain.      Reports hemorrhoids and  rectal pain.     Denies anal fissure, black tarry stools, change in bowel habit, constipation, diarrhea, diverticulosis, fecal incontinence, heme positive stool, irritable bowel syndrome, jaundice, light color stool, liver problems, and  rectal bleeding.    Current Medications (verified): 1)  Toprol Xl 50 Mg  Tb24 (Metoprolol Succinate) .... Once Daily 2)  Astelin 137 Mcg/spray  Soln (Azelastine Hcl) .... Once Daily As Needed 3)  Nasonex 50 Mcg/act  Susp (Mometasone Furoate) .... 2 Sprays Once Daily As Needed 4)  Miralax   Powd (Polyethylene Glycol 3350) .... Once Daily As Needed 5)  Pristiq 50 Mg Xr24h-Tab (Desvenlafaxine Succinate) .... Take 1 Tablet  By Mouth Every Morning 6)  Lamictal 25 Mg Tabs (Lamotrigine) .... Take 2 Tablet By Mouth Once A Day 7)  Seroquel 25 Mg Tabs (Quetiapine Fumarate) .... Take 1 Tab By Mouth At Bedtime 8)  Clonazepam 0.5 Mg Tabs (Clonazepam) .... Take 1 Tablet By Mouth Once A Day 9)  Zantac 300 Mg Tabs (Ranitidine Hcl) .... Take 1 Tablet By Mouth Once A Day 10)  Analpram-Hc 1-2.5 % Crea (Hydrocortisone Ace-Pramoxine) .... Apply To Rectum 2-3 Times Per Day As Needed....if Further Refill Needed, Please Call Office! 11)  Lysteda 650 Mg Tabs (Tranexamic Acid) .... 2 Tablets By Mouth Three Times A Day 12)  Iron 90 (18 Fe) Mg Tabs (Ferrous Sulfate) .[name].. 1 Tablet By Mouth Once Daily  Allergies (verified): 1)  ! Sulfa 2)  ! Augmentin  Past History:  Past Medical History: Reviewed history from 08/23/2008 and no changes required. MIGRAINE HEADACHE (ICD-346.90) POLYARTHRALGIA (ICD-719.49) ESOPHAGEAL STRICTURE (ICD-530.3) PEPTIC ULCER DISEASE (ICD-533.90) DEPRESSION (ICD-311) ANXIETY (ICD-300.00) ALLERGIC RHINITIS (ICD-477.9) PREMATURE VENTRICULAR CONTRACTIONS (ICD-427.69) MITRAL VALVE PROLAPSE, HX OF (ICD-V12.50) IRRITABLE BOWEL SYNDROME, HX OF (ICD-V12.79) PUD, HX OF (ICD-V12.71) CRAMP IN LIMB (ICD-729.82) OBESITY, MODERATE (ICD-278.00)  Past Surgical History: Reviewed history from 03/11/2008 and no changes required. Appendectomy Tonsillectomy s/p cervix colposcopy c-section x 1  Family History: Reviewed history from 08/23/2008 and no changes required. Father - biologic father unknown mother- '48:  with subarachnoid hemorrhage '05 brother- '69 with depression, anxiety MGM - colon cancer Neg- breast cancer,  MGF - DM, CAD parents with anxiety, ETOH  Social History: Reviewed history from 08/23/2008 and no changes required. HSG, some college courses. Married '99 1 son - '02 Work - unemployed (lost job '08) Marriage in good health No history  of physical or sexual abuse.  Review of Systems        The patient complains of fatigue and menstrual pain.  The patient denies allergy/sinus, anemia, anxiety-new, arthritis/joint pain, back pain, blood in urine, breast changes/lumps, change in vision, confusion, cough, coughing up blood, depression-new, fainting, fever, headaches-new, hearing problems, heart murmur, heart rhythm changes, itching, muscle pains/cramps, night sweats, nosebleeds, pregnancy symptoms, shortness of breath, skin rash, sleeping problems, sore throat, swelling of feet/legs, swollen lymph glands, thirst - excessive , urination - excessive , urination changes/pain, urine leakage, vision changes, and voice change.    Vital Signs:  Patient profile:   45 year old female Height:      65 inches Weight:      197 pounds BMI:     32.90 Pulse rate:   68 / minute Pulse rhythm:   regular BP sitting:   102 / 62  (left arm)  Vitals Entered By: Milford Cage NCMA (December 19, 2009 3:06 PM)  Physical Exam  General:  Well developed, well nourished, no acute distress. Head:  Normocephalic and atraumatic. Eyes:  Conjunctiva pink, no icterus.  Neck:  no obvious masses  Lungs:  Clear throughout to auscultation. Heart:  Regular rate and rhythm; no murmurs, rubs,  or bruits. Abdomen:  Abdomen soft, nondistended. There is moderate tenderness to LLQ. No obvious masses or hepatomegaly.Normal bowel sounds.  Rectal:  No external or internal lesions appreciated. Extremities:  No palmar erythema, no edema.  Neurologic:  Alert and  oriented x4;  grossly normal neurologically. Skin:  Intact without significant lesions or rashes. Cervical Nodes:  No significant cervical adenopathy. Psych:  Alert and cooperative. Normal mood and affect.   Impression & Recommendations:  Problem # 1:  ABDOMINAL PAIN-LLQ (ICD-789.04) Assessment Deteriorated  Four weeks history of cramping, new for her. She saw GYN yesterday, having dysfunctional bleeding for a week but pain predates that. GYN recommended GI  appt. Her LLQ is more tender than one would expect with IBS. No history of diverticular disease but her pain has been ongoing for a month. She has IBS-C. I have recommended trial of Amitiza which may help constipation and global IBS symptoms. Will try antispasmotics as well. I think she needs CTscan to rule out diverticulitis and will get that scheduled. In the meantime patient will call if she significantly improves with antispasmotics and CTscan will be put on hold. She will follow up with Dr. Juanda Chance.   Orders: CT Abdomen/Pelvis with Contrast (CT Abd/Pelvis w/con)  Problem # 2:  IRRITABLE BOWEL SYNDROME (ICD-564.1) Assessment: Comment Only See above  Problem # 3:  GERD (ICD-530.81) Assessment: Deteriorated Start daily PPI  Problem # 4:  ANXIETY (ICD-300.00) Assessment: Comment Only History of depression as well. She is on medication  Patient Instructions: 1)  We sent perscriptions to your pharmacy. 2)  Bentyl 10 MG. 3)  Amitiza, use the samples first. 4)  We scheduled the CT at [facility] CT [address] 5)  Contrast and directions provided. 6)  Take prevacid daily before breakfast. 7)  Copy sent to : Illene Regulus, MD 8)  The medication list was reviewed and reconciled.  All changed / newly prescribed medications were explained.  A complete medication list was provided to the patient / caregiver. Prescriptions: AMITIZA 8 MCG CAPS (  LUBIPROSTONE) Take 1 tab twice daily  #60 x 0   Entered by:   Lowry Ram NCMA   Authorized by:   Willette Cluster NP   Signed by:   Lowry Ram NCMA on 12/19/2009   Method used:   Electronically to        [facility]  [address] [phone] (retail)       [address]       [place], [place]  [id number]       Ph: [phone] or [phone]       Fax: [phone]   RxID:   [id number] BENTYL 10 MG CAPS (DICYCLOMINE HCL) Take 1 tab twice daily As needed for cramping and spasms  #60 x 1   Entered by:   Lowry Ram NCMA   Authorized by:   Willette Cluster NP   Signed by:   Lowry Ram NCMA on 12/19/2009   Method used:   [facility] to        [facility]  [address] [phone]* (retail)       [address]       [place], [place]  [id number]       Ph: [phone] or [phone]       Fax: [phone]   RxID:   [id number]

## 2010-11-08 NOTE — Assessment & Plan Note (Signed)
Summary: RASH BEHIND EAR AND INTO EYE/NWS   Vital Signs:  Patient profile:   45 year old female Height:      65 inches Weight:      200 pounds BMI:     33.40 O2 Sat:      98 % on Room air Temp:     98.6 degrees F oral Pulse rate:   68 / minute BP sitting:   110 / 70  (left arm) Cuff size:   regular  Vitals Entered By: Bill Salinas CMA (October 10, 2010 10:59 AM)  O2 Flow:  Room air CC: pt here with c/o rash behind ears going onto her face pt states she noticed symptoms about oct/ ab   Primary Care Provider:  Illene Regulus, MD  CC:  pt here with c/o rash behind ears going onto her face pt states she noticed symptoms about oct/ ab.  History of Present Illness: Patient presents due to a recurrent rash behind the ears and a rash on the left upper eyelid medially. She has tried a variety of otc meds/creams,low dose cortisone without resolution. There is no pruritis, purulent drainage or pain associated with the rash. It has not been spreading. She reports that she only wears gold  or allogenic sterling silve ear jewelry. She has not changed soaps or detergents or cosmetics.   Current Medications (verified): 1)  Toprol Xl 50 Mg  Tb24 (Metoprolol Succinate) .... Once Daily 2)  Astelin 137 Mcg/spray  Soln (Azelastine Hcl) .... Once Daily As Needed 3)  Nasonex 50 Mcg/act  Susp (Mometasone Furoate) .... 2 Sprays Once Daily As Needed 4)  Miralax   Powd (Polyethylene Glycol 3350) .... Once Daily As Needed 5)  Pristiq 50 Mg Xr24h-Tab (Desvenlafaxine Succinate) .... Take 1 Tablet By Mouth Every Morning 6)  Lamictal 25 Mg Tabs (Lamotrigine) .... Take 2 Tablet By Mouth Once A Day 7)  Seroquel 25 Mg Tabs (Quetiapine Fumarate) .... Take 1 Tab By Mouth At Bedtime 8)  Clonazepam 0.5 Mg Tabs (Clonazepam) .... Take 1 Tablet By Mouth Once A Day 9)  Zantac 300 Mg Tabs (Ranitidine Hcl) .... Take 1 Tablet By Mouth Once A Day 10)  Analpram-Hc 1-2.5 % Crea (Hydrocortisone Ace-Pramoxine) .... Apply To  Rectum 2-3 Times Per Day As Needed....if Further Refill Needed, Please Call Office! 11)  Iron 90 (18 Fe) Mg Tabs (Ferrous Sulfate) .[name].. 1 Tablet By Mouth Once Daily  Allergies (verified): 1)  ! Sulfa 2)  ! 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) ESOPHAGEAL STRICTURE (ICD-530.3) PEPTIC ULCER DISEASE (ICD-533.90) DEPRESSION (ICD-311) ANXIETY (ICD-300.00) ALLERGIC RHINITIS (ICD-477.9) PREMATURE VENTRICULAR CONTRACTIONS (ICD-427.69) MITRAL VALVE PROLAPSE, HX OF (ICD-V12.50) IRRITABLE BOWEL SYNDROME, HX OF (ICD-V12.79) PUD, HX OF (ICD-V12.71) CRAMP IN LIMB (ICD-729.82) OBESITY, MODERATE (ICD-278.00)  Past Surgical History: Last updated: 01/24/2010 Appendectomy Tonsillectomy s/p cervix colposcopy c-section x 1 Left breast cystectomy FH reviewed for relevance, SH/Risk Factors reviewed for relevance  Review of Systems  The patient denies anorexia, fever, weight loss, weight gain, chest pain, headaches, abdominal pain, muscle weakness, difficulty walking, depression, abnormal bleeding, and enlarged lymph nodes.    Physical Exam  General:  Well-developed,well-nourished,in no acute distress; alert,appropriate and cooperative throughout examination Head:  normocephalic and atraumatic.   Eyes:  pupils equal and pupils round.  C&S clear Skin:  erythematous macular rash behind each ear without exoriations, drainage or significant scale. Hard to see rash on the medial left upper eyelid - macular,  erythematous   Impression & Recommendations:  Problem # 1:  SKIN RASH (ICD-782.1) Rash as described that does not look fungal in nature but more excezmatous.  Plan - betamethasone 0.05% two times a day to rash behind the ears          Triamcinolone 0.025% to eyelid two times a day for max of 5 days.          If rash doesnt' clear will refer to Derm. She has seen Dr. Londell Moh in the past.   Her  updated medication list for this problem includes:    Betamethasone Dipropionate 0.05 % Crea (Betamethasone dipropionate) .[name]... Apply light coat two times a day to rash behind ears    Triamcinolone Acetonide 0.025 % Crea (Triamcinolone acetonide) .[name]... Apply very lightly to eye lid two times a day for no more than 5 days.  Complete Medication List: 1)  Toprol Xl 50 Mg Tb24 (Metoprolol succinate) .... Once daily 2)  Astelin 137 Mcg/spray Soln (Azelastine hcl) .... Once daily as needed 3)  Nasonex 50 Mcg/act Susp (Mometasone furoate) .... 2 sprays once daily as needed 4)  Miralax Powd (Polyethylene glycol 3350) .... Once daily as needed 5)  Pristiq 50 Mg Xr24h-tab (Desvenlafaxine succinate) .... Take 1 tablet by mouth every morning 6)  Lamictal 25 Mg Tabs (Lamotrigine) .... Take 2 tablet by mouth once a day 7)  Seroquel 25 Mg Tabs (Quetiapine fumarate) .... Take 1 tab by mouth at bedtime 8)  Clonazepam 0.5 Mg Tabs (Clonazepam) .... Take 1 tablet by mouth once a day 9)  Zantac 300 Mg Tabs (Ranitidine hcl) .... Take 1 tablet by mouth once a day 10)  Analpram-hc 1-2.5 % Crea (Hydrocortisone ace-pramoxine) .... Apply to rectum 2-3 times per day as needed....if further refill needed, please call office! 11)  Iron 90 (18 Fe) Mg Tabs (Ferrous sulfate) .[name].. 1 tablet by mouth once daily 12)  Betamethasone Dipropionate 0.05 % Crea (Betamethasone dipropionate) .... Apply light coat two times a day to rash behind ears 13)  Triamcinolone Acetonide 0.025 % Crea (Triamcinolone acetonide) .... Apply very lightly to eye lid two times a day for no more than 5 days. Prescriptions: TRIAMCINOLONE ACETONIDE 0.025 % CREA (TRIAMCINOLONE ACETONIDE) apply very lightly to eye lid two times a day for no more than 5 days.  #10g x 1   Entered and Authorized by:   Jacques Navy MD   Signed by:   Jacques Navy MD on 10/10/2010   Method used:   [facility]        [facility]  [address] [phone]* (retail)       [address]       [place], [place]  [id number]       Ph: [phone] or [phone]       Fax: [phone]   RxID:   [id number] BETAMETHASONE DIPROPIONATE 0.05 % CREA (BETAMETHASONE DIPROPIONATE) apply light coat two times a day to rash behind ears  #15g x 1   Entered and Authorized by:   Jacques Navy MD   Signed by:   Jacques Navy MD on 10/10/2010   Method used:   [facility]        [facility]  [address] [phone]* (retail)       [address]       [place], [place]  [id number]  Ph: [phone] or [phone]       Fax: [phone]   RxID:   [id number]    Orders Added: 1)  Est. Patient Level III [33295]

## 2010-12-26 LAB — CBC
HCT: 39.4 % (ref 36.0–46.0)
Hemoglobin: 13.3 g/dL (ref 12.0–15.0)
MCHC: 33.7 g/dL (ref 30.0–36.0)
RBC: 4.33 MIL/uL (ref 3.87–5.11)
RDW: 12.9 % (ref 11.5–15.5)

## 2011-02-19 NOTE — Assessment & Plan Note (Signed)
[facility]                         GASTROENTEROLOGY OFFICE NOTE   BRIZA, BARK                       MRN:          [id number]  DATE:11/05/2007                            DOB:          06-06-1966    Ms. Cordy is a 45 year old white female with irritable bowel  syndrome/constipation.  She also has a family history of colon cancer,  which has been fully evaluated with at least 2 prior colonoscopies, the  last one in April 2008.  She has had a little dyspepsia, flatulence, and  bloating. Upper abdominal ultrasound in 2001, and again in November  2008, was negative, but she has a very strong family history of  gallbladder disease in several close relatives.  We have seen her in  2001 for pyloric channel ulcer.  She has had symptomatic external  hemorrhoids confirmed on colonoscopy in April 2008, which have become  rather aggravated last week when she started taking MiraLax for her  constipation and bloating.  She specifically complains today of foul  smelling gas noted by her husband, who told her to go to the doctor to  fix it.   MEDICATIONS:  1. Toprol XL 50 mg p.o. daily.  2. Wellbutrin XL 300 mg p.o. daily.  3. Xanax 1 mg 1-1/2 tablets nightly.  4. Astelin 1 daily.  5. Nasonex daily.  6. Anusol HC suppositories nightly.  7. Cymbalta 60 mg daily.  8. Protonix 40 mg daily.  9. Seroquel 300 mg daily.   PHYSICAL EXAMINATION:  Blood pressure 100/60.  Pulse 88.  Weight 194  pounds, which is a 3-pound weight gain since last visit in December  2008.  She was in no distress.  LUNGS:  Clear to auscultation.  COR:  Normal S1 and normal S2.  ABDOMEN:  Protuberant, obese, soft, tender over suprapubic area toward  the right lower quadrant.  There was an appendectomy scar in the right  lower quadrant.  There was also tenderness in the left upper quadrant  along the splenic flexure, but no palpable mass.  Liver edge was at  costal margin.   __________ area did not appear to be tender, and her  liver edge was at costal margin.  RECTAL:  Exam showed external hemorrhoidal tags.  These did not seem to  be inflamed.  Rectal tone was normal.  Stool was Hemoccult negative.[name]   IMPRESSION:  1. A 45 year old white female with external hemorrhoids.  No evidence      of thrombosis.  2. Irritable bowel syndrome/constipation.  3. Dyspepsia.  Bloating.  Rule out biliary dysfunction despite of      normal upper abdominal ultrasound.  Rule out bacteria overgrowth.   PLAN:  1. Low-fat diet with modifications to decrease the gas.  We have a      booklet on gas with patient review.  2. Robinul forte 2 mg 1/2 to 1 tablet p.r.n. abdominal pain.  3. Analpram cream 2.5% [facility].  4. HIDA scan with [facility].  5. Continue Activia yogurt.  6. Urinalysis to assess suprapubic discomfort to rule out cystitis.  Hedwig Morton. Juanda Chance, MD  Electronically Signed    DMB/MedQ  DD: 11/05/2007  DT: 11/05/2007  Job #: [id number]   cc:   Rosalyn Gess. Norins, MD

## 2011-02-19 NOTE — Assessment & Plan Note (Signed)
[facility]                         GASTROENTEROLOGY OFFICE NOTE   Tamara Cross, Tamara Cross                       MRN:          [id number]  DATE:09/21/2007                            DOB:          30-Jan-1966    Tamara Cross is a 45 year old white female who had exacerbation of  abdominal pain. Her episode started with nausea lasting about six weeks  and then developed and progressed into epigastric pain radiating into  intrascapular area. She also had some neck pain and right shoulder pain  and chest pain of the right side. Upper abdominal ultrasound by Dr.  Debby Bud showed normal gallbladder and common bile duct. She had a prior  abdominal ultrasound in the past. There is a family history of  gallbladder disease in one cousin. Tamara Cross has a history of  gastroesophageal reflux. Upper endoscopy in March 2001, showed small  pyloric ulcer. She had a repeat endoscopy in February 2005 that showed  hiatal hernia and mild esophageal stricture which was dilated with the  [facility] dilator. She has currently been having some difficulty with  solid food dysphagia on intermittent basis. After starting on Nexium 40  mg a day, given to her by Dr. Audria Nine, her symptoms have improved and  it is much better now. She has actually increased the Nexium to two a  day. She would prefer to be taking Protonix, however, because it is  generic and her insurance prefers it.   The patient also has a family history of colon cancer. Her last  colonoscopy in April 2008 was normal.   PHYSICAL EXAMINATION:  Blood pressure 98/60, pulse 78, weight 191  pounds. The patient was mildly overweight in no distress.  LUNGS: Clear to auscultation.  COR: Normal S1, normal S2.  ABDOMEN: Epigastrium was tender in subxiphoid area in the midline  without radiation to left or right upper quadrant. There was no  costovertebral angle tenderness. Bowel sounds were normoactive.   IMPRESSION:  A  45 year old white female with epigastric and chest pain  radiating to right shoulder blade with normal upper abdominal ultrasound  and history of gastroesophageal reflux and esophageal stricture. She is  having symptoms suggestive of recurrent stricture. The pain has markedly  improved on PPI.   PLAN:  1. Upper endoscopy with possible esophageal dilatation in January      2009.  2. Switch to pantoprazole 40 mg a day with p.r.n. b.i.d. use for      exacerbation of her symptoms.  3. Anti-reflux measures including weight loss.  4. Avoid nonsteroidal anti-inflammatories.   If the symptoms continue despite PPIs, we will suggest HIDA scan with  [facility].     Tamara Morton. Juanda Chance, MD  Electronically Signed    DMB/MedQ  DD: 09/21/2007  DT: 09/21/2007  Job #: [id number]   cc:   Rosalyn Gess. Norins, MD

## 2011-02-19 NOTE — Assessment & Plan Note (Signed)
[facility]                                 ON-CALL NOTE   PARTHENA, FERGESON                       MRN:          [id number]  DATE:01/16/2008                            DOB:          Jan 19, 1966    Phone number is [phone]   PRIMARY CARE PHYSICIAN:  Rosalyn Gess. Norins, MD   SUBJECTIVE:  Ms. Timko mentions that she noticed one of her ankles that  is swollen laterally.  She has not had any fall.  She did injure her  other leg several weeks back with a fracture and has been walking in a  different way since.  She denies any redness or real pain in the leg.  It is just this focal lateral swelling.   ASSESSMENT AND PLAN:  Recommended continued nonsteroidal anti-  inflammatory drugs as well as adding ice and elevation.  If she still  continues to have problems, will follow with her regular doctor.  If it  becomes severe she will be seen this weekend.     Kerby Nora, MD  Electronically Signed    AB/MedQ  DD: 01/16/2008  DT: 01/16/2008  Job #: [id number]

## 2011-02-19 NOTE — Assessment & Plan Note (Signed)
[facility]                                 ON-CALL NOTE   Tamara Cross, Tamara Cross                          MRN:          [id number]  DATE:08/22/2007                            DOB:          03/01/1956    PRIMARY CARE PHYSICIAN:  Rosalyn Gess. Norins, M.D.   Called from [phone].  She called at 7:17 p.m. on August 22, 2007,  complaining of chest pain.  She states she was having some heartburn and  gas and took some medication for that and symptoms did improve some.  She does have a history of ulcer disease but this seemed a little bit  different.  She was having some pain between the breasts and pressure.  No shortness of breath.  I did recommend that she go to urgent care to  be evaluated and the patient agreed.     Lelon Perla, DO  Electronically Signed    Shawnie Dapper  DD: 08/22/2007  DT: 08/23/2007  Job #: [id number]   cc:   Rosalyn Gess. Norins, MD

## 2011-02-22 NOTE — Letter (Signed)
December 22, 2006    Rene Kocher, M.D.  [address]  [place], [place] [address]   RE:  [name], Tamara Cross  MRN:  [id number]  /  DOB:  09/25/1966   Dear Woody Seller:   I saw Ms. [name] in the office today, the 17th.  She had had a bad  bout of viral gastroenteritis with diarrhea that awoke her from sleep.  She had explosive diarrhea, nausea, vomiting, and abdominal discomfort.  Fortunately, her symptoms have markedly improved on her own, and she is  given Phenergan for nausea today.   Patient has a history of migraine headaches in the past.  I last saw the  patient for headaches several months ago.  At that time, thought her  symptoms with frontal occipital discomfort, crushing-like in nature,  with photophobia and persistent duration, was more like muscle tension  headache.  At that time she was treated non-steroidal anti-inflammatory  medications with a modicum of results.   In the office today, the patient is again complaining of a headache with  a frontal occipital distribution, photophobia, and has been persistent  for several days, no doubt exacerbated by her viral gastroenteritis.  In  the office she is given an injection of ketorolac 30 mg IM, and after 15  minutes has marked improvement in her headache.  She was given a  prescription for ketorolac 10 mg to take t.i.d. on a p.r.n. basis with a  limit of 15 tablets.   The patient reports that she sees you on regular basis for migraine  headaches, and I thought this would be helpful information in regards to  her ongoing management.   If you have any questions, please do not hesitate to contact me.  As  always I appreciate your care for my patients with their migraine  headaches and other neurologic problems.    Sincerely,      Rosalyn Gess. Norins, MD  Electronically Signed    MEN/MedQ  DD: 12/23/2006  DT: 12/23/2006  Job #: [id number]

## 2011-02-22 NOTE — Assessment & Plan Note (Signed)
[facility]                             PRIMARY CARE OFFICE NOTE   CHERYAL, SALAS                       MRN:          [id number]  DATE:06/30/2006                            DOB:          [birth year]    Tamara Cross is a 45 year old Caucasian woman, who I follow for medical care  and problems. She was last seen in the office April 22, 2006 for neck pain  and discomfort, with decreased range of motion.   The patient reports that she was at work. She had a laughing episode where  she admits that she laughed so hard, she could not breathe. Following this,  she had a nearly syncopal episode. Because of this, the patient was  transported by EMS to [facility] Emergency Department. Based  on a review of the records available to me, she had a nonfocal neurologic  exam. She had a CT scan of the brain without contrast, that was negative for  any signs of abnormality, specifically no mass lesion, no hemorrhage. The  patient also came to have a CT angiogram of the neck vessels to rule out a  question of carotid dissection and this was a normal study, including  carotids, vertebrals, and basal artery. The patient, although already on  alprazolam, was given a prescription for lorazepam. She was scheduled for an  MRI of the brain for unspecified reasons. Additionally, she had a normal  urinalysis at the hospital. Her CBC was normal. Her basic metabolic panel  was unremarkable for ____________ 1 .2 and a glucose of 115. The patient now  presents for follow-up.   The patient reports that she has been feeling bad. She has a frontal  occipital headache which has been persistent for several days and she is  concerned that her blood pressure is higher than normal.   REVIEW OF SYSTEMS:  Negative for any constitutional, cardiovascular,  respiratory, GI, or GU complaints.   PHYSICAL EXAMINATION:  VITALS:  Temperature 97.4, blood pressure 135/79 - on  my  repeat it was 120/80 on the right and 108/70 on the left, heart rate was  75, weight 188 pounds.  GENERAL:  This is an overweight Caucasian woman in no acute distress.  HEENT EXAM:  Unremarkable, with no abnormalities noted.  CARDIOVASCULAR:  2+ radial pulse. She had no JVD, no carotid bruits. She had  a regular rate and rhythm, without murmurs, rubs, or gallops.  NEUROLOGIC EXAM:  The patient was awake, alert, and oriented to person,  place, time, and context. Cranial nerves II-XII are intact with normal  facial symmetry. Extraocular muscles movement. Cerebellar function was  normal with normal tandem gait. The patient was able to stand up to the exam  table without difficulty. DTRs were 2+ and symmetrical. Motor strength noted  as normal.   ASSESSMENT AND PLANS:  1. Near syncope. Suspect the patient had problem with hypoxemia, secondary      to laughter, leading to a near syncopal episode. Her evaluation in the      emergency department at [facility], also her physical  exam and neurologic exam in the office today were normal. CT scan of      the brain was unremarkable. CTA of the neck vessels was normal. At this      point, I see no indication for further evaluation and have recommended      against an MRI at this time. The patient was advised to watch for signs      of any focal neurologic deficits.  2. Blood pressure.  The patient's blood pressure is actually at her      baseline on my exam.  3. Anxiety.  The patient does have a history of anxiety, as well as panic      attacks in the past. At this point, she has been taking Xanax 1 mg 3-4      daily.  4. Headache.  The patient with symptoms that are very suggestive of a      muscle tension type headache.  As noted she has a nonfocal      examination.[name]   PLANS:  1. The patient is not to take lorazepam.  2. The patient is to taper down to Xanax 1 mg q.8h. for one day, then      q.12h. for 1-2 days, and then  back to q.h.s. only.  3. The patient is to use over-the-counter nonsteroidal anti-inflammatory      drug for relief.Rosalyn Gess Norins, MD  Job# [id number]  MEN/MedQ  DD:  06/30/2006  DT:  07/02/2006  Job #:  [id number]   cc:   Santa Lighter

## 2011-02-22 NOTE — Discharge Summary (Signed)
[place]  Patient:    Tamara Cross, Tamara Cross                       MRN: [id number] Adm. Date:  [id number] Disc. Date: [id number] Attending:  Alric Quan Dictator:   Leonides Cave, P.A. CC:         Charlaine Dalton. Sherene Sires, M.D. [facility]             Bruce R. Juanda Chance, M.D. [facility]                           Discharge Summary  DATE OF BIRTH:  1966/09/16  ADMITTING PHYSICIAN:  Cecil Cranker, M.D.  DISCHARGE DIAGNOSES: 1. Chest pain, unclear etiology.  Likely associated to anxiety. 2. Long history of palpitations (the patient will have an event monitor as an    outpatient). 3. Negative stress echocardiogram on Feb 14, 2000. 4. A strong history of anxiety/bipolar disorder. 5. History of peptic ulcers. 6. Left neck lymphadenopathy on azithromycin.  BRIEF HISTORY:  The patient is a 45 year old white female with history of a mild mitral valve prolapse, palpitations which have been successfully treated with Atenolol in the past, anxiety, and bipolar disorder.  The patient had exertional chest pain earlier this year with subsequent exercise treadmill test, which was apparently negative.  Early on the day admission, she was walking up her driveway when she developed "pain in my left breast."  She eventually became slightly light-headed and states "I had a run of a rapid heart rate."  She then noticed a dullness in her low back and she subsequently called the [facility] Cardiology office.  She denied shortness of breath but stated that she did have mild diaphoresis and nausea and she was told to come to the emergency department via her phone conversation with the cardiology office.  EKG in the emergency department revealed normal sinus rhythm with no acute abnormalities.  The patient was admitted to rule out for myocardial infarction and to watch on telemetry unit and set up for a stress echocardiogram.  HOSPITAL COURSE:  On Feb 14, 2000, the patient had a stress  echo with Dr. Dietrich Pates.  The patient exercised in Bruce protocol to 10 minutes and 50 seconds with a peak heart rate of 175.  EKG was without ischemic changes.  The patient experienced no chest pain but did have some back pain, which was transient.  Her baseline echocardiogram revealed normal LV/RV function, minimal prolapse of anterior mitral leaflet with trace MR.  With exercise, LV and RV became hyperdynamic without development of wall motion abnormalities. The overall test was completely negative.  Dr. Tenny Craw subsequently felt that the patient could be discharged home in stable condition.  DISCHARGE MEDICATIONS:  The patient was discharged home on the same medications she came in on, which include: 1. Buspar for anxiety. 2. Atenolol 25 mg q.d. 3. Protonix 40 mg q.d. 4. Xanax p.r.n. 5. She will continue her Z-Pak.  DISCHARGE INSTRUCTIONS:  Her activity level is as tolerated.  The patient was told that the office will call her about picking up a 30-day event monitor for her fairly common "spells" of tachy palpitations.  She will then follow up with Dr. Charlies Constable after her four weeks on the event monitor.   She will call the office next week for a four- to six-week follow-up with Dr. Juanda Chance. She was also given the recommendation  to stop smoking.  The patient also showed some concern of not having a cholesterol panel drawn in the past and this is something that we can draw as an outpatient as well.  The patient ruled out for MI with negative cardiac enzymes.  Her TSH was normal at 2.33 and her urinalysis was negative as well. DD:  02/14/00 TD:  02/14/00 Job: [id number]

## 2011-02-22 NOTE — Op Note (Signed)
[facility] [place]  Patient:    DEBARA, KAMPHUIS                       MRN: [id number] Adm. Date:  [id number] Attending:  Osborn Coho                           Operative Report  PREOPERATIVE DIAGNOSES:       1. Intrauterine pregnancy at 42 weeks 4estimated                                  gestational age.                               2. Suspected macrosomia.  POSTOPERATIVE DIAGNOSES:      1. Intrauterine pregnancy at 72 weeks 4estimated                                  gestational age.                               2. Suspected macrosomia.  PROCEDURE:                    Primary low transverse cesarean section.  SURGEON:                      Mark E. Dareen Piano, M.D.  ANESTHESIA:                   Spinal.  ANTIBIOTICS:                  Ampicillin 2 g.  Gentamicin 100 mg.  ESTIMATED BLOOD LOSS:         900 cc.  COMPLICATIONS:                None.  SPECIMENS:                    None.  FINDINGS:                     The patient had normal fallopian tubes and ovaries bilaterally.  The uterus appeared to be normal.  The placenta appeared to be normal.  Three-vessel cord.  The patient delivered on live, viable white female infant weighing 9 lb 3 oz.  Apgars were 9 at one minute and 9 at five minutes.  DESCRIPTION OF PROCEDURE:     The patient was taken to the operating room, where a spinal anesthetic was administered without complications.  She was then placed in the dorsal supine position with a left lateral tilt.  She was prepped with Hibiclens and a Foley catheter was placed.  She was draped in the usual fashion for this procedure.  A Pfannenstiel incision was made.  This wad carried down to the fascia.  The fascia was entered in the midline and extended laterally with Mayo scissors.  The rectus muscles were then dissected from the fascia with a Bovie.  The rectus muscles were divided in the midline and taken superiorly and inferiorly.  The  parietal peritoneum was entered sharply and taken superiorly and inferiorly.  A bladder flap was taken down sharply.  A low transverse uterine incision was made in the midline and extended laterally with blunt dissection.  Amniotic fluid was noted to be clear.  The infant was delivered in the vertex presentation.  The oropharynx and nostrils were bulb suctioned.  The remaining infant was then delivered. The cord was doubly clamped and cut.  The infant was handed to the awaiting NICU team.  Cord blood was then obtained.  The placenta was then manually removed.  The uterus was exteriorized.  The uterine cavity was wiped with a wet lap.  The uterine incision was closed in a single layer of 0 chromic in a running locking fashion.  A small area of bleeding in the middle was made hemostatic with 0 Monocryl suture in an interrupted figure-of-eight fashion. The bladder flap was not closed.  The uterus was placed back in the abdominal cavity.  The gutters were wiped with a wet lap.  The parietal peritoneum was not closed.  The fascia was closed using 0 Monocryl in a  running locking fashion.  The fascia was made hemostatic with the Bovie.  Stainless steel clips were used to close the skin.  The patient tolerated the procedure well. She was taken to the recovery room in stable condition.  Instrument and lap counts were correct x 2. DD:  04/20/01 TD:  04/20/01 Job: [id number]

## 2011-02-22 NOTE — Assessment & Plan Note (Signed)
[facility]                             PRIMARY CARE OFFICE NOTE   Tamara Cross, Tamara Cross                       MRN:          [id number]  DATE:06/30/2006                            DOB:          1965-12-18    ADDENDUM:  Headache.  The patient with symptoms that are very suggestive of  a muscle tension type headache.  As noted she has a nonfocal examination.   PLAN:  The patient is to use over-the-counter nonsteroidal anti-inflammatory  drug for relief.                                   Rosalyn Gess Norins, MD   MEN/MedQ  DD:  06/30/2006  DT:  07/02/2006  Job #:  [id number]

## 2011-02-22 NOTE — Assessment & Plan Note (Signed)
[facility]                         GASTROENTEROLOGY OFFICE NOTE   JASMON, MATTICE                       MRN:          [id number]  DATE:10/02/2006                            DOB:          20-Jul-1966    Ms. Fickling is a 45 year old white female with symptomatic hemorrhoids. We  had seen her for followup of hemorrhoids in 2001 and most recently in  May 2005. She saw Dr. Zachery Dakins and had them surgically tied. She now  for several weeks has had problems with rectal pain, bleeding, swelling,  some leakage from the rectum. She has chronic constipation for which she  takes MiraLax 17 g a day causing 3-4 bowel movements a day.   PHYSICAL EXAMINATION:  VITAL SIGNS:  Blood pressure 110/62, pulse 76,  weight 181 pounds.  ABDOMEN:  Soft, nontender with normoactive bowel sounds.  COR:  Normal S1, normal S2.  LUNGS:  Clear to auscultation.  RECTAL:  A rectal and anoscopic exam shows normal perianal area, several  skin tags protruding through the anal canal. Rectal tone itself was  normal. There were large second-degree hemorrhoids circumferentially in  the rectal ampulla with some bleeding. Fistula on hemorrhoid at 9  o'clock caused bleeding. There was some tenderness on the exam. There  was large amount of oliguric stool seeping through the anal canal.   IMPRESSION:  1. Second degree hemorrhoids mostly internal status post prolapse now      improved with Anusol suppositories and topical steroids.  2. Chronic constipation currently having more diarrhea from MiraLax.   PLAN:  1. We discussed surgical versus medical treatment of hemorrhoids and      decided to go ahead and continue to treat medically for the next      four weeks and then reassess. The patient may need another surgical      referral for banding.  2. Anusol HC suppositories twice a day.  3. Decrease MiraLax to 8 g daily to avoid leakage of the stool.  4. Continue topical cortisone Analpram  cream.     Hedwig Morton. Juanda Chance, MD  Electronically Signed    DMB/MedQ  DD: 10/02/2006  DT: 10/02/2006  Job #: [id number]   cc:   Malva Limes, M.D.

## 2011-02-22 NOTE — Discharge Summary (Signed)
[facility]  Patient:    Tamara Cross, Tamara Cross                       MRN: [id number] Adm. Date:  [id number] Disc. Date: [id number] Attending:  Osborn Coho Dictator:   Leilani Able, P.A.                           Discharge Summary  FINAL DIAGNOSES:              Intrauterine pregnancy at 41 weeks estimated gestational age, suspected macrosomia.  PROCEDURE:                    Primary low transverse cesarean section.  SURGEON:                      Dr. Malva Limes  COMPLICATIONS:                None.  HISTORY:                      This 45 year old G2, P0-0-1-0 presents at [redacted] weeks gestation for cesarean section secondary to suspected macrosomia. Patient had an ultrasound performed in the office last week which estimated a fetal weight of 4100 g.  Patient does not have gestational diabetes and pregnancy has been complicated by maternal tachycardia.  Patient saw a cardiologist during her prenatal course and was found to have mitral valve prolapse and sinus tachycardia and patient also has an anxiety disorder. Patient is also advanced maternal age, but declines amniocentesis at this time.  She is taken to the operating room on April 20, 2001 by Dr. Malva Limes where a primary low transverse cesarean section was performed with the delivery of a 9 pound 3 ounce female infant with Apgars of 9 and 9. Delivery went without complication.  Patients postoperative course was benign without significant fevers.  She was felt ready for discharge on postoperative day #3.  Was sent home on a regular diet.  Told to decrease activities.  Told to continue prenatal vitamins.  Was given prescriptions for Tylox #30 one to two q.4h. p.r.n. for pain and Motrin 600 mg #30 one q.6h. p.r.n. for pain. She was told to follow-up in the office in four weeks. DD:  05/14/01 TD:  05/14/01 Job: [id number]

## 2011-04-02 ENCOUNTER — Encounter (INDEPENDENT_AMBULATORY_CARE_PROVIDER_SITE_OTHER): Payer: Self-pay | Admitting: Surgery

## 2011-04-02 ENCOUNTER — Ambulatory Visit (INDEPENDENT_AMBULATORY_CARE_PROVIDER_SITE_OTHER): Payer: 59 | Admitting: Surgery

## 2011-04-02 VITALS — BP 132/84 | HR 58 | Temp 97.2°F | Ht 64.75 in | Wt 179.8 lb

## 2011-04-02 DIAGNOSIS — K648 Other hemorrhoids: Secondary | ICD-10-CM

## 2011-04-02 NOTE — Progress Notes (Signed)
Subjective:     Patient ID: Tamara Cross, female   DOB: April 25, 1966, 45 y.o.   MRN: [id number]    BP 132/84  Pulse 58  Temp 97.2 F (36.2 C)  Ht 5' 4.75" (1.645 m)  Wt 179 lb 12.8 oz (81.557 kg)  BMI 30.15 kg/m2    HPI  Reason for visit: Hemorrhoids with bleeding  Ms. Dismuke is a 45 year old obese overweight female with hemorrhoid problems for at least the past 10 years. She's had a hemorrhoidectomy by Dr. Zachery Dakins 2005. She's had one is right in the past, most recently 6 months ago in our office.  She still stuggles with hemorrhoids. She has irritable bowel with that constipation component.  She takes Miralax about every other day, but is only fairly compliant with that.  She will get bleeding almost every day. She does use wet wipes. She has tried suppositories in the past. They don't seem to help too much. The hemorrhoids bother her when she tries to sit. She's works out regularly and at the end of her workout she gets pain. She will get fecal urgency at times and some encopresis. However she has not any incontinence to flatus or stools.  Burning / scraping feeling with BMs.  No severe/sharp dyschezia.  She comes today for consideration of something more aggressive. She was of good injections but now wonders if she should have surgery.  Review of Systems  Constitutional: Negative for chills, diaphoresis and fatigue.  Respiratory: Negative for cough and chest tightness.   Cardiovascular: Negative for chest pain and leg swelling.  Gastrointestinal: Positive for constipation, anal bleeding and rectal pain. Negative for nausea, vomiting and diarrhea.  Genitourinary: Negative for dysuria, urgency, frequency, vaginal discharge, vaginal pain, menstrual problem and pelvic pain.  Musculoskeletal: Negative for back pain and arthralgias.  Skin: Negative for color change, pallor, rash and wound.  Neurological: Negative for tremors, syncope, weakness and light-headedness.  Hematological:  Negative for adenopathy. Does not bruise/bleed easily.  Psychiatric/Behavioral: Negative for behavioral problems and dysphoric mood. The patient is not nervous/anxious.   All other systems reviewed and are negative.       Objective:   Physical Exam  Constitutional: She is oriented to person, place, and time. She appears well-developed and well-nourished. No distress.  HENT:  Head: Normocephalic.  Mouth/Throat: Oropharynx is clear and moist.  Eyes: Conjunctivae and EOM are normal. Pupils are equal, round, and reactive to light.  Neck: Normal range of motion. Neck supple.  Cardiovascular: Normal rate, regular rhythm and normal heart sounds.   Pulmonary/Chest: Effort normal and breath sounds normal. No respiratory distress. She has no wheezes. She has no rales. She exhibits no tenderness.  Abdominal: Soft. Bowel sounds are normal. She exhibits no distension and no mass. There is no tenderness. There is no rebound, no guarding and no CVA tenderness. No hernia. Hernia confirmed negative in the ventral area.  Genitourinary: Uterus normal. Rectal exam shows external hemorrhoid, internal hemorrhoid and tenderness. Rectal exam shows no fissure, no mass and anal tone normal. There is no rash or lesion on the right labia. There is no rash or lesion on the left labia. Right adnexum displays no tenderness. Left adnexum displays no tenderness. No erythema or bleeding around the vagina. No vaginal discharge found.       Right posterior int hemorrhoid irritated/enlarged.  Mild prolapse.  Right ant hem irritated.  Left hem mildly inflammed.  Few posterior anal skin folds.  Hygeine good.  Perianal skin clear. No anal  fissure.  No fistula.  Musculoskeletal: Normal range of motion. She exhibits no edema and no tenderness.  Lymphadenopathy:       Right: No inguinal adenopathy present.       Left: No inguinal adenopathy present.  Neurological: She is alert and oriented to person, place, and time. No cranial nerve  deficit. Coordination normal.  Skin: Skin is warm and dry. No rash noted. She is not diaphoretic. No erythema. No pallor.  Psychiatric: She has a normal mood and affect. Her behavior is normal. Judgment and thought content normal.       Assessment:     Internal hemorrhoids with occasional prolapse, frequent bleeding and irritation in the setting of chronic constipation and irritable bowel syndrome. Fair compliance with the bowel regimen.    Plan:     I had a long discussion with the patient concerning her hemorrhoids. I stressed that a bowel regimen is essential to keeping hemorrhoids and under control and minimize recurrent inflammation. No matter what I do, the hemorrhoids will worsen or at leasst return if she does not get on her bowel regimen and stay on MiraLax. She agreed.  She notes that when she's on the MiraLax every other day her hemorrhoids are released irritating. However she still having these problems on that bowel regimen.  I had a long discussion by using wet wipes, warm soaks, bowel regimen. etc, working down once all problem today. She is pretty close to doing already.  That the inflamed hemorrhoids are too distal to tolerate banding. I am skeptical another injection is going to work for her if she's had a few in the past year.  I think she could be a good candidate for [facility] hemorrhoidal artery ligation & hemorrhoidopexy. It will decrease blood flow and control of bleeding better and correct the prolapse of the hemorrhoids as well.  I normally don't offer this first but she has been relatively compliant with her bowel regimen and is still having symptoms. She has had symptoms returned with only a few within a few months of an injection.  The other option is to stay very compliant her bowel regimen for the next month and sees things calmed down better.   She initially was leaning towards distended bowel regimen. However in talking, she wants to consider surgery now. We will work  to coordinate this at a convenient time.

## 2011-04-18 ENCOUNTER — Other Ambulatory Visit: Payer: Self-pay | Admitting: Internal Medicine

## 2011-05-30 ENCOUNTER — Ambulatory Visit (HOSPITAL_COMMUNITY): Admission: RE | Admit: 2011-05-30 | Payer: 59 | Source: Ambulatory Visit | Admitting: Surgery

## 2011-05-31 ENCOUNTER — Other Ambulatory Visit (INDEPENDENT_AMBULATORY_CARE_PROVIDER_SITE_OTHER): Payer: 59

## 2011-05-31 ENCOUNTER — Telehealth: Payer: Self-pay | Admitting: *Deleted

## 2011-05-31 DIAGNOSIS — R350 Frequency of micturition: Secondary | ICD-10-CM

## 2011-05-31 LAB — URINALYSIS, ROUTINE W REFLEX MICROSCOPIC
Ketones, ur: NEGATIVE
Specific Gravity, Urine: <=1.005 (ref 1.000–1.030)
Urine Glucose: 100
Urobilinogen, UA: 1 (ref 0.0–1.0)

## 2011-05-31 MED ORDER — CIPROFLOXACIN HCL 250 MG PO TABS: 250.0000 mg | ORAL_TABLET | Freq: Two times a day (BID) | ORAL | Status: AC

## 2011-05-31 NOTE — Telephone Encounter (Signed)
Per Epic, pt allergic to sulfa derivatives. Rx for cipro 250 mg sent to pharmacy. LMOVM for pt to check with them.

## 2011-05-31 NOTE — Telephone Encounter (Signed)
Ok for septra DS bid x 5 days. If allergic to sulfa - cipro 250mg  bid x 5 days

## 2011-05-31 NOTE — Telephone Encounter (Signed)
Patient requesting RX for uti. No open apts, put lab order in for u/a, pt coming in now. Hold note open for results.

## 2011-06-03 ENCOUNTER — Encounter: Payer: Self-pay | Admitting: Internal Medicine

## 2011-07-06 ENCOUNTER — Encounter: Payer: Self-pay | Admitting: Family Medicine

## 2011-07-06 ENCOUNTER — Ambulatory Visit (INDEPENDENT_AMBULATORY_CARE_PROVIDER_SITE_OTHER): Payer: 59 | Admitting: Family Medicine

## 2011-07-06 VITALS — BP 120/80 | HR 96 | Temp 99.7°F | Wt 179.0 lb

## 2011-07-06 DIAGNOSIS — J40 Bronchitis, not specified as acute or chronic: Secondary | ICD-10-CM

## 2011-07-06 MED ORDER — AZITHROMYCIN 250 MG PO TABS: ORAL_TABLET | ORAL | Status: AC

## 2011-07-06 MED ORDER — HYDROCOD POLST-CHLORPHEN POLST 10-8 MG/5ML PO LQCR: 5.0000 mL | Freq: Every evening | ORAL | Status: DC | PRN

## 2011-07-06 NOTE — Patient Instructions (Signed)
Bronchitis Bronchitis is the body's way of reacting to injury and/or infection (inflammation) of the bronchi. Bronchi are the air tubes that extend from the windpipe into the lungs. If the inflammation becomes severe, it may cause shortness of breath.  CAUSES Inflammation may be caused by:  A virus.   Germs (bacteria).   Dust.   Allergens.   Pollutants and many other irritants.  The cells lining the bronchial tree are covered with tiny hairs (cilia). These constantly beat upward, away from the lungs, toward the mouth. This keeps the lungs free of pollutants. When these cells become too irritated and are unable to do their job, mucus begins to develop. This causes the characteristic cough of bronchitis. The cough clears the lungs when the cilia are unable to do their job. Without either of these protective mechanisms, the mucus would settle in the lungs. Then you would develop pneumonia. Smoking is a common cause of bronchitis and can contribute to pneumonia. Stopping this habit is the single most important thing you can do to help yourself. TREATMENT  Your caregiver may prescribe an antibiotic if the cough is caused by bacteria. Also, medicines that open up your airways make it easier to breathe. Your caregiver may also recommend or prescribe an expectorant. It will loosen the mucus to be coughed up. Only take over-the-counter or prescription medicines for pain, discomfort, or fever as directed by your caregiver.   Removing whatever causes the problem (smoking, for example) is critical to preventing the problem from getting worse.   Cough suppressants may be prescribed for relief of cough symptoms.   Inhaled medicines may be prescribed to help with symptoms now and to help prevent problems from returning.   For those with recurrent (chronic) bronchitis, there may be a need for steroid medicines.  SEEK IMMEDIATE MEDICAL CARE IF:  During treatment, you develop more pus-like mucus  (purulent sputum).   You or your child has an oral temperature above 100.4, not controlled by medicine.   Your baby is older than 3 months with a rectal temperature of 102 F (38.9 C) or higher.   Your baby is 3 months old or younger with a rectal temperature of 100.4 F (38 C) or higher.   You become progressively more ill.   You have increased difficulty breathing, wheezing, or shortness of breath.  It is necessary to seek immediate medical care if you are elderly or sick from any other disease. MAKE SURE YOU:  Understand these instructions.   Will watch your condition.   Will get help right away if you are not doing well or get worse.  Document Released: 09/23/2005 Document Re-Released: 12/18/2009 [employer] Patient Information 2011 [employer]. 

## 2011-07-06 NOTE — Progress Notes (Signed)
  Subjective:     Tamara Cross is a 45 y.o. female here for evaluation of a cough. Onset of symptoms was 1 week ago. Symptoms have been gradually worsening since that time. The cough is productive of green sputum and is aggravated by reclining position. Associated symptoms include: chest pain, fever, shortness of breath, sputum production and wheezing. Patient does not have a history of asthma. Patient does have a history of environmental allergens. Patient has not traveled recently. Patient does not have a history of smoking. Patient has not had a previous chest x-ray. Patient has not had a PPD done.  The following portions of the patient's history were reviewed and updated as appropriate: allergies, current medications, past family history, past medical history, past social history, past surgical history and problem list.  Review of Systems Pertinent items are noted in HPI.    Objective:    Oxygen saturation 91% on room air BP 120/80  Pulse 96  Temp(Src) 99.7 F (37.6 C) (Oral)  Wt 179 lb (81.194 kg)  SpO2 91% General appearance: alert, cooperative, appears stated age and no distress Ears: normal TM's and external ear canals both ears Nose: Nares normal. Septum midline. Mucosa normal. No drainage or sinus tenderness. Throat: lips, mucosa, and tongue normal; teeth and gums normal Lungs: diminished breath sounds bilaterally Heart: regular rate and rhythm, S1, S2 normal, no murmur, click, rub or gallop Extremities: extremities normal, atraumatic, no cyanosis or edema Lymph nodes: Cervical, supraclavicular, and axillary nodes normal.    Assessment:    Acute Bronchitis    Plan:    Antibiotics per medication orders. Antitussives per medication orders. Avoid exposure to tobacco smoke and fumes. Call if shortness of breath worsens, blood in sputum, change in character of cough, development of fever or chills, inability to maintain nutrition and hydration. Avoid exposure to tobacco  smoke and fumes.

## 2011-07-16 LAB — DIFFERENTIAL
Basophils Absolute: 0
Basophils Relative: 0
Eosinophils Absolute: 0.1 — ABNORMAL LOW
Eosinophils Relative: 1
Monocytes Absolute: 0.4

## 2011-07-16 LAB — CBC
Platelets: 243
RBC: 4.41
WBC: 8.3

## 2011-07-16 LAB — COMPREHENSIVE METABOLIC PANEL
ALT: 14
AST: 16
Albumin: 3.7
Alkaline Phosphatase: 56
CO2: 28
Chloride: 103
GFR calc Af Amer: >60
GFR calc non Af Amer: >60
Potassium: 4
Total Bilirubin: 1

## 2011-07-16 LAB — LIPASE, BLOOD: Lipase: 20

## 2011-09-23 ENCOUNTER — Other Ambulatory Visit: Payer: Self-pay | Admitting: Internal Medicine

## 2011-11-22 ENCOUNTER — Other Ambulatory Visit: Payer: Self-pay | Admitting: Obstetrics and Gynecology

## 2011-12-12 ENCOUNTER — Ambulatory Visit: Payer: 59 | Admitting: Internal Medicine

## 2012-01-11 ENCOUNTER — Other Ambulatory Visit: Payer: Self-pay | Admitting: Internal Medicine

## 2012-01-20 ENCOUNTER — Telehealth: Payer: Self-pay

## 2012-01-20 DIAGNOSIS — R51 Headache: Secondary | ICD-10-CM

## 2012-01-20 NOTE — Telephone Encounter (Signed)
Pt called requesting referral to Neurologist for persistent headaches. Last OV 10/10/2010

## 2012-01-27 NOTE — Telephone Encounter (Signed)
Referral place to Dr. Vela Prose

## 2012-03-20 ENCOUNTER — Ambulatory Visit (INDEPENDENT_AMBULATORY_CARE_PROVIDER_SITE_OTHER): Payer: 59 | Admitting: Internal Medicine

## 2012-03-20 ENCOUNTER — Encounter: Payer: Self-pay | Admitting: Internal Medicine

## 2012-03-20 ENCOUNTER — Other Ambulatory Visit: Payer: Self-pay | Admitting: Internal Medicine

## 2012-03-20 VITALS — BP 110/72 | HR 72 | Temp 98.2°F | Ht 64.0 in | Wt 194.0 lb

## 2012-03-20 DIAGNOSIS — B351 Tinea unguium: Secondary | ICD-10-CM

## 2012-03-20 MED ORDER — TERBINAFINE HCL 250 MG PO TABS: 250.0000 mg | ORAL_TABLET | Freq: Every day | ORAL | Status: DC

## 2012-03-20 NOTE — Assessment & Plan Note (Addendum)
Clinical dx, for lamisil course with lft's at 6 wks, d/w pt efficacy at best about 75%

## 2012-03-20 NOTE — Patient Instructions (Addendum)
Take all new medications as prescribed Please return in 6 weeks for liver tests (LAB only, no office visit needed) You will be contacted by phone if any changes need to be made immediately.  Otherwise, you will receive a letter about your results with an explanation.

## 2012-03-21 ENCOUNTER — Encounter: Payer: Self-pay | Admitting: Internal Medicine

## 2012-03-21 NOTE — Progress Notes (Signed)
  Subjective:    Patient ID: Tamara Cross, female    DOB: [birth year], 46 y.o.   MRN: [id number]  HPI  Here with c/o 2-3 mo worsening right great toenail onychomycotic change, very distressing to her, very motivated to tx.  Denies worsening depressive symptoms, suicidal ideation, or panic, though has ongoing anxiety.  Past Medical History  Diagnosis Date  . Allergy     chroni rhinitis  . GERD (gastroesophageal reflux disease)   . Heart murmur   . Ulcer     previously had   Past Surgical History  Procedure Date  . Cesarean section 2002  . Appendectomy   . Tonsillectomy   . Cystectomy     left breast    reports that she has quit smoking. She does not have any smokeless tobacco history on file. She reports that she drinks alcohol. She reports that she does not use illicit drugs. family history is not on file. Allergies  Allergen Reactions  . Amoxicillin-Pot Clavulanate   . Sulfonamide Derivatives    Current Outpatient Prescriptions on File Prior to Visit  Medication Sig Dispense Refill  . clonazePAM (KLONOPIN) 0.5 MG tablet Take 0.5 mg by mouth as needed.        . desvenlafaxine (PRISTIQ) 100 MG 24 hr tablet Take 100 mg by mouth daily.        . LamoTRIgine (LAMICTAL PO) Take by mouth 2 (two) times daily.        . metoprolol succinate (TOPROL-XL) 50 MG 24 hr tablet TAKE 1 TABLET DAILY  90 tablet  0  . nystatin-triamcinolone (MYCOLOG II) cream       . QUEtiapine (SEROQUEL) 25 MG tablet Take 25 mg by mouth at bedtime.        [name] VALTREX 500 MG tablet       . chlorpheniramine-HYDROcodone (TUSSIONEX PENNKINETIC ER) 10-8 MG/5ML LQCR Take 5 mLs by mouth at bedtime as needed.  140 mL  0   Review of Systems All otherwise neg per pt    Objective:   Physical Exam BP 110/72  Pulse 72  Temp 98.2 F (36.8 C) (Oral)  Ht 5\' 4"  (1.626 m)  Wt 194 lb (87.998 kg)  BMI 33.30 kg/m2  SpO2 96% Physical Exam  VS noted Constitutional: Pt appears well-developed and well-nourished.  HENT: Head:  Normocephalic.  Right Ear: External ear normal.  Left Ear: External ear normal.  Eyes: Conjunctivae and EOM are normal. Pupils are equal, round, and reactive to light.  Neck: Normal range of motion. Neck supple.  Cardiovascular: Normal rate and regular rhythm.   Pulmonary/Chest: Effort normal and breath sounds normal.  Skin: Skin is warm. No erythema. Right great nail approx 30% typical onychomycotic appearance Psychiatric: Pt behavior is normal. Thought content normal. 1-2+ nervous    Assessment & Plan:

## 2012-04-01 ENCOUNTER — Encounter: Payer: 59 | Admitting: Internal Medicine

## 2012-04-01 DIAGNOSIS — Z0289 Encounter for other administrative examinations: Secondary | ICD-10-CM

## 2012-05-29 ENCOUNTER — Ambulatory Visit (INDEPENDENT_AMBULATORY_CARE_PROVIDER_SITE_OTHER): Payer: 59 | Admitting: Internal Medicine

## 2012-05-29 ENCOUNTER — Telehealth: Payer: Self-pay | Admitting: Internal Medicine

## 2012-05-29 ENCOUNTER — Ambulatory Visit: Payer: 59 | Admitting: Internal Medicine

## 2012-05-29 ENCOUNTER — Encounter: Payer: Self-pay | Admitting: Internal Medicine

## 2012-05-29 VITALS — BP 114/66 | HR 72 | Temp 98.1°F | Resp 16 | Wt 200.0 lb

## 2012-05-29 DIAGNOSIS — J209 Acute bronchitis, unspecified: Secondary | ICD-10-CM

## 2012-05-29 MED ORDER — MOXIFLOXACIN HCL 400 MG PO TABS: 400.0000 mg | ORAL_TABLET | Freq: Every day | ORAL | Status: AC

## 2012-05-29 MED ORDER — HYDROCODONE-HOMATROPINE 5-1.5 MG/5ML PO SYRP: 5.0000 mL | ORAL_SOLUTION | Freq: Three times a day (TID) | ORAL | Status: AC | PRN

## 2012-05-29 NOTE — Telephone Encounter (Signed)
Caller: Joann/Patient; Patient Name: Tamara Cross; PCP: Illene Regulus; Best Callback Phone Number: [phone]; Reason for call: Upper Respiratory Congestion. Afebrile.  Onset approx 2 wks ago.  Pt taking Alka Seltzer Congestion and Cough with no relief.  Pt feels like chest is full, coughing up green sputum, and ears feel congested. Increase shortness of  breath with exertion and cough.  All emergeny symptoms ruled out per Breathing Problems with exception to 'New or increasing production of yellow, green or brown sputum'  See Provider witin 24 hrs.  Appt scheduled 05/29/12 @ 1530 with Dr. Yetta Barre.

## 2012-05-29 NOTE — Progress Notes (Signed)
  Subjective:    Patient ID: Tamara Cross, female    DOB: August 10, 1966, 46 y.o.   MRN: [id number]  Cough This is a new problem. The current episode started in the past 7 days. The problem has been gradually worsening. The problem occurs every few hours. The cough is productive of purulent sputum. Associated symptoms include chills, rhinorrhea and a sore throat. Pertinent negatives include no chest pain, ear congestion, ear pain, fever, headaches, heartburn, hemoptysis, myalgias, nasal congestion, postnasal drip, rash, shortness of breath, sweats, weight loss or wheezing. Nothing aggravates the symptoms. She has tried prescription cough suppressant for the symptoms. The treatment provided mild relief.      Review of Systems  Constitutional: Positive for chills. Negative for fever, weight loss, diaphoresis, activity change, appetite change, fatigue and unexpected weight change.  HENT: Positive for sore throat and rhinorrhea. Negative for ear pain and postnasal drip.   Eyes: Negative.   Respiratory: Positive for cough. Negative for apnea, hemoptysis, choking, chest tightness, shortness of breath, wheezing and stridor.   Cardiovascular: Negative for chest pain, palpitations and leg swelling.  Gastrointestinal: Negative.  Negative for heartburn.  Genitourinary: Negative.   Musculoskeletal: Negative.  Negative for myalgias.  Skin: Negative.  Negative for color change, pallor, rash and wound.  Neurological: Negative for dizziness, tremors, seizures, syncope, facial asymmetry, speech difficulty, weakness, light-headedness, numbness and headaches.  Hematological: Negative for adenopathy. Does not bruise/bleed easily.  Psychiatric/Behavioral: Negative.        Objective:   Physical Exam  Vitals reviewed. Constitutional: She is oriented to person, place, and time. She appears well-developed and well-nourished.  Non-toxic appearance. She does not have a sickly appearance. She does not appear ill. No  distress.  HENT:  Head: Normocephalic and atraumatic.  Mouth/Throat: Oropharynx is clear and moist. No oropharyngeal exudate.  Eyes: Conjunctivae are normal. Right eye exhibits no discharge. Left eye exhibits no discharge. No scleral icterus.  Neck: Normal range of motion. Neck supple. No JVD present. No tracheal deviation present. No thyromegaly present.  Cardiovascular: Normal rate, regular rhythm, normal heart sounds and intact distal pulses.  Exam reveals no gallop and no friction rub.   No murmur heard. Pulmonary/Chest: Effort normal and breath sounds normal. No accessory muscle usage or stridor. Not tachypneic. No respiratory distress. She has no decreased breath sounds. She has no wheezes. She has no rhonchi. She has no rales. She exhibits no tenderness.  Abdominal: Soft. Bowel sounds are normal. She exhibits no distension and no mass. There is no tenderness. There is no rebound and no guarding.  Musculoskeletal: Normal range of motion. She exhibits no edema and no tenderness.  Lymphadenopathy:    She has no cervical adenopathy.  Neurological: She is oriented to person, place, and time.  Skin: Skin is warm and dry. No rash noted. She is not diaphoretic. No erythema. No pallor.  Psychiatric: She has a normal mood and affect. Her behavior is normal. Judgment and thought content normal.          Assessment & Plan:

## 2012-05-29 NOTE — Patient Instructions (Signed)

## 2012-05-29 NOTE — Assessment & Plan Note (Signed)
Start avelox for the infection and a different cough suppressant

## 2012-06-17 NOTE — Addendum Note (Signed)
Addended by: Anselm Jungling on: 06/17/2012 12:00 PM   Modules accepted: Orders

## 2012-06-18 ENCOUNTER — Other Ambulatory Visit: Payer: Self-pay | Admitting: Internal Medicine

## 2012-06-22 ENCOUNTER — Ambulatory Visit (INDEPENDENT_AMBULATORY_CARE_PROVIDER_SITE_OTHER): Payer: 59 | Admitting: Internal Medicine

## 2012-06-22 ENCOUNTER — Ambulatory Visit: Payer: 59 | Admitting: Internal Medicine

## 2012-06-22 ENCOUNTER — Other Ambulatory Visit (INDEPENDENT_AMBULATORY_CARE_PROVIDER_SITE_OTHER): Payer: 59

## 2012-06-22 ENCOUNTER — Encounter: Payer: Self-pay | Admitting: Internal Medicine

## 2012-06-22 VITALS — BP 116/68 | HR 65 | Temp 98.4°F | Resp 14 | Wt 206.4 lb

## 2012-06-22 DIAGNOSIS — S90129A Contusion of unspecified lesser toe(s) without damage to nail, initial encounter: Secondary | ICD-10-CM

## 2012-06-22 DIAGNOSIS — B351 Tinea unguium: Secondary | ICD-10-CM

## 2012-06-22 DIAGNOSIS — L608 Other nail disorders: Secondary | ICD-10-CM

## 2012-06-22 DIAGNOSIS — S90229A Contusion of unspecified lesser toe(s) with damage to nail, initial encounter: Secondary | ICD-10-CM

## 2012-06-22 LAB — HEPATIC FUNCTION PANEL
Albumin: 3.8 g/dL (ref 3.5–5.2)
Alkaline Phosphatase: 62 U/L (ref 39–117)
Total Protein: 6.4 g/dL (ref 6.0–8.3)

## 2012-06-22 NOTE — Progress Notes (Signed)
  Subjective:    Patient ID: Tamara Cross, female    DOB: 1966-03-21, 46 y.o.   MRN: [id number]  HPI  Complains of bruising discoloration under left great toenail Denies pain or swelling Denies specific injury, but notes new tennis shoes and increased tennis activity Right great toe nail fungal infections improving with Lamisil, ok for med refill?  Past Medical History  Diagnosis Date  . Allergy     chroni rhinitis  . GERD (gastroesophageal reflux disease)   . Heart murmur   . Ulcer     previously had    Review of Systems  Constitutional: Negative for fever and fatigue.  Musculoskeletal: Negative for back pain, arthralgias and gait problem.       Objective:   Physical Exam BP 116/68  Pulse 65  Temp 98.4 F (36.9 C) (Oral)  Resp 14  Wt 206 lb 6 oz (93.611 kg)  SpO2 97% General: No acute distress Skin: Left great nail with subungual hematoma, no erythema or associated swelling -nonpainful to palpation. Right great nail with fungal changes Musculoskeletal: No gross deformity, swelling or pain. Neurovascularly intact distally  Lab Results  Component Value Date   WBC 6.5 01/16/2010   HGB 13.3 01/16/2010   HCT 39.4 01/16/2010   PLT 206 01/16/2010   GLUCOSE 108* 04/26/2010   CHOL 178 08/23/2008   TRIG 47 08/23/2008   HDL 52.7 08/23/2008   LDLCALC 116* 08/23/2008   ALT 22 06/22/2012   AST 25 06/22/2012   NA 143 04/26/2010   K 4.7 04/26/2010   CL 104 04/26/2010   CREATININE 0.5 04/26/2010   BUN 13 04/26/2010   CO2 31 04/26/2010   TSH 1.28 08/23/2008         Assessment & Plan:   Left great toenail with subungual hematoma. Suspect benign trauma with new shoes and increase activity. No specific treatment recommended as there is no pain or symptoms related to hematoma. Reassurance provided.  Onychomycosis, right great nail. Continue Lamisil if liver function tests are normal. Labs as previously ordered

## 2012-06-22 NOTE — Patient Instructions (Addendum)
It was good to see you today. No treatment is needed for your toenail - give this 6-12 months to "grow out" and avoid tight fitting shoes or injury call if any pain, fever, redness or other problems develop Subungual Hematoma You have an accumulation of blood under your nail called a subungual hematoma. This is usually caused by crush injuries to the fingers and toes. Subungual hematomas are often very painful because of the pressure that builds up with bleeding under the nail. If the hematoma is large, the injured nail will often separate from its bed over the first few weeks, and a new nail will form in its place. Treatment is directed at pain control and relieving the pressure under the nail. This injury can be managed by making a small hole in the nail to drain the blood. A heated probe is often used to make the hole in the nail. The pain gets better almost at once after this procedure. Apply ice packs every 2-3 hours and elevate your injury over the next 2 days to reduce the pain. If the pressure builds up again, you can soak the finger or toe in warm water to help promote drainage.  See your doctor or call the emergency room at once if there are signs of an infected nail which include increased pain, redness, swelling, or pus drainage. Document Released: 10/31/2004 Document Revised: 06/05/2011 Document Reviewed: 09/23/2005 [facility] Patient Information 2012 [place], [place].

## 2012-06-24 ENCOUNTER — Other Ambulatory Visit: Payer: Self-pay | Admitting: Internal Medicine

## 2012-06-26 ENCOUNTER — Other Ambulatory Visit: Payer: Self-pay | Admitting: Internal Medicine

## 2012-06-26 ENCOUNTER — Other Ambulatory Visit: Payer: Self-pay | Admitting: *Deleted

## 2012-06-26 MED ORDER — METOPROLOL SUCCINATE ER 50 MG PO TB24: 50.0000 mg | ORAL_TABLET | Freq: Every day | ORAL | Status: DC

## 2012-06-26 NOTE — Telephone Encounter (Signed)
Patient says that her insurance is declining to pay for her Lamisil and we need to call for prior auth [id number], patient is out of her medication and needs this to be taken care of today

## 2012-06-26 NOTE — Telephone Encounter (Signed)
PATIENT MUST MAKE APPT. WITH DR. Debby Bud FOR FURTHER REFILLS

## 2012-06-28 ENCOUNTER — Encounter: Payer: Self-pay | Admitting: Internal Medicine

## 2012-06-29 ENCOUNTER — Other Ambulatory Visit: Payer: Self-pay | Admitting: *Deleted

## 2012-06-29 MED ORDER — METOPROLOL SUCCINATE ER 50 MG PO TB24: 50.0000 mg | ORAL_TABLET | Freq: Every day | ORAL | Status: DC

## 2012-07-01 ENCOUNTER — Other Ambulatory Visit: Payer: Self-pay | Admitting: *Deleted

## 2012-07-01 MED ORDER — METOPROLOL SUCCINATE ER 50 MG PO TB24: 50.0000 mg | ORAL_TABLET | Freq: Every day | ORAL | Status: DC

## 2012-07-02 NOTE — Telephone Encounter (Signed)
LEFT MESSAGE ON HOME# OF NEED FOR PATIENT TO SCHEDULE APPT. WITH DR, Debby Bud FOR FURTHER REFILLS AND PA FOR HER MEDICATION LAMASIL.

## 2012-08-26 ENCOUNTER — Ambulatory Visit (INDEPENDENT_AMBULATORY_CARE_PROVIDER_SITE_OTHER): Payer: 59 | Admitting: Internal Medicine

## 2012-08-26 ENCOUNTER — Encounter: Payer: Self-pay | Admitting: Internal Medicine

## 2012-08-26 VITALS — BP 116/70 | HR 81 | Temp 98.7°F | Resp 10 | Wt 205.1 lb

## 2012-08-26 DIAGNOSIS — J329 Chronic sinusitis, unspecified: Secondary | ICD-10-CM

## 2012-08-26 MED ORDER — AZITHROMYCIN 250 MG PO TABS: ORAL_TABLET | ORAL | Status: DC

## 2012-08-26 NOTE — Patient Instructions (Addendum)
Sinusitis - very tender sinuses, low grade fever. Lungs are clear, no lymph node enlargement.  Plan Z-pak  Sudafed 30 mg two or three times a day  Hydrate  Vitamin C 1500 mg daily  Ecchincacea if you believe in it  Cough syrup of choice  Nasal saline spray  Stove top vaporizer treatments for comfort.    Sinusitis Sinusitis is redness, soreness, and swelling (inflammation) of the paranasal sinuses. Paranasal sinuses are air pockets within the bones of your face (beneath the eyes, the middle of the forehead, or above the eyes). In healthy paranasal sinuses, mucus is able to drain out, and air is able to circulate through them by way of your nose. However, when your paranasal sinuses are inflamed, mucus and air can become trapped. This can allow bacteria and other germs to grow and cause infection. Sinusitis can develop quickly and last only a short time (acute) or continue over a long period (chronic). Sinusitis that lasts for more than 12 weeks is considered chronic.   CAUSES   Causes of sinusitis include:  Allergies.   Structural abnormalities, such as displacement of the cartilage that separates your nostrils (deviated septum), which can decrease the air flow through your nose and sinuses and affect sinus drainage.   Functional abnormalities, such as when the small hairs (cilia) that line your sinuses and help remove mucus do not work properly or are not present.  SYMPTOMS   Symptoms of acute and chronic sinusitis are the same. The primary symptoms are pain and pressure around the affected sinuses. Other symptoms include:  Upper toothache.   Earache.   Headache.   Bad breath.   Decreased sense of smell and taste.   A cough, which worsens when you are lying flat.   Fatigue.   Fever.   Thick drainage from your nose, which often is green and may contain pus (purulent).   Swelling and warmth over the affected sinuses.  DIAGNOSIS   Your caregiver will perform a physical exam.  During the exam, your caregiver may:  Look in your nose for signs of abnormal growths in your nostrils (nasal polyps).   Tap over the affected sinus to check for signs of infection.   View the inside of your sinuses (endoscopy) with a special imaging device with a light attached (endoscope), which is inserted into your sinuses.  If your caregiver suspects that you have chronic sinusitis, one or more of the following tests may be recommended:  Allergy tests.   Nasal culture A sample of mucus is taken from your nose and sent to a lab and screened for bacteria.   Nasal cytology A sample of mucus is taken from your nose and examined by your caregiver to determine if your sinusitis is related to an allergy.  TREATMENT   Most cases of acute sinusitis are related to a viral infection and will resolve on their own within 10 days. Sometimes medicines are prescribed to help relieve symptoms (pain medicine, decongestants, nasal steroid sprays, or saline sprays).   However, for sinusitis related to a bacterial infection, your caregiver will prescribe antibiotic medicines. These are medicines that will help kill the bacteria causing the infection.   Rarely, sinusitis is caused by a fungal infection. In theses cases, your caregiver will prescribe antifungal medicine. For some cases of chronic sinusitis, surgery is needed. Generally, these are cases in which sinusitis recurs more than 3 times per year, despite other treatments. HOME CARE INSTRUCTIONS    Drink plenty of  water. Water helps thin the mucus so your sinuses can drain more easily.   Use a humidifier.   Inhale steam 3 to 4 times a day (for example, sit in the bathroom with the shower running).   Apply a warm, moist washcloth to your face 3 to 4 times a day, or as directed by your caregiver.   Use saline nasal sprays to help moisten and clean your sinuses.   Take over-the-counter or prescription medicines for pain, discomfort, or fever only  as directed by your caregiver.  SEEK IMMEDIATE MEDICAL CARE IF:  You have increasing pain or severe headaches.   You have nausea, vomiting, or drowsiness.   You have swelling around your face.   You have vision problems.   You have a stiff neck.   You have difficulty breathing.  MAKE SURE YOU:    Understand these instructions.   Will watch your condition.   Will get help right away if you are not doing well or get worse.  Document Released: 09/23/2005 Document Revised: 12/16/2011 Document Reviewed: 10/08/2011 [facility] Patient Information 2013 [place], [place].
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
## 2012-09-14 ENCOUNTER — Other Ambulatory Visit: Payer: Self-pay | Admitting: Occupational Medicine

## 2012-09-14 ENCOUNTER — Ambulatory Visit: Payer: Self-pay

## 2012-09-14 DIAGNOSIS — R52 Pain, unspecified: Secondary | ICD-10-CM

## 2012-10-19 ENCOUNTER — Encounter: Payer: Self-pay | Admitting: Internal Medicine

## 2012-10-19 ENCOUNTER — Ambulatory Visit (INDEPENDENT_AMBULATORY_CARE_PROVIDER_SITE_OTHER): Payer: 59 | Admitting: Internal Medicine

## 2012-10-19 VITALS — BP 110/68 | HR 73 | Temp 99.1°F | Resp 10 | Wt 206.1 lb

## 2012-10-19 DIAGNOSIS — M79609 Pain in unspecified limb: Secondary | ICD-10-CM

## 2012-10-19 DIAGNOSIS — M79604 Pain in right leg: Secondary | ICD-10-CM
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
## 2012-10-19 NOTE — Patient Instructions (Addendum)
Right leg pain - there is minor swelling about the right knee but no visible lesion. Normal reflexes are not and good range of motion of the knee. You were only able to squat about 10% of normal before being limited by pain.  Impression - mechanical injury right leg Plan Consultation with Dr. Madelon Lips for orthopedics  OK to take Ibuprofen 800 mg (4 tabs) three times a day. Be alert to any abdominal pain or change in stools.

## 2012-11-02 ENCOUNTER — Other Ambulatory Visit: Payer: Self-pay | Admitting: Internal Medicine

## 2013-01-05 ENCOUNTER — Encounter (HOSPITAL_COMMUNITY): Payer: Self-pay | Admitting: *Deleted

## 2013-01-05 ENCOUNTER — Emergency Department (HOSPITAL_COMMUNITY): Payer: 59

## 2013-01-05 ENCOUNTER — Emergency Department (HOSPITAL_COMMUNITY)
Admission: EM | Admit: 2013-01-05 | Discharge: 2013-01-06 | Disposition: A | Discharge status: Home / Self Care | Payer: 59 | Attending: Emergency Medicine | Admitting: Emergency Medicine

## 2013-01-05 DIAGNOSIS — Z8711 Personal history of peptic ulcer disease: Secondary | ICD-10-CM | POA: Insufficient documentation

## 2013-01-05 DIAGNOSIS — Z8709 Personal history of other diseases of the respiratory system: Secondary | ICD-10-CM | POA: Insufficient documentation

## 2013-01-05 DIAGNOSIS — R110 Nausea: Secondary | ICD-10-CM | POA: Insufficient documentation

## 2013-01-05 DIAGNOSIS — Z8679 Personal history of other diseases of the circulatory system: Secondary | ICD-10-CM | POA: Insufficient documentation

## 2013-01-05 DIAGNOSIS — Z8719 Personal history of other diseases of the digestive system: Secondary | ICD-10-CM | POA: Insufficient documentation

## 2013-01-05 DIAGNOSIS — R51 Headache: Secondary | ICD-10-CM | POA: Insufficient documentation

## 2013-01-05 DIAGNOSIS — Z79899 Other long term (current) drug therapy: Secondary | ICD-10-CM | POA: Insufficient documentation

## 2013-01-05 DIAGNOSIS — Z87891 Personal history of nicotine dependence: Secondary | ICD-10-CM | POA: Insufficient documentation

## 2013-01-05 LAB — CSF CELL COUNT WITH DIFFERENTIAL
RBC Count, CSF: 445 /mm3 — ABNORMAL HIGH
Tube #: 1
Tube #: 3

## 2013-01-05 LAB — CBC
HCT: 40.9 % (ref 36.0–46.0)
Hemoglobin: 14.2 g/dL (ref 12.0–15.0)
MCH: 30.9 pg (ref 26.0–34.0)
MCHC: 34.7 g/dL (ref 30.0–36.0)
MCV: 89.1 fL (ref 78.0–100.0)
Platelets: 276 10*3/uL (ref 150–400)
RBC: 4.59 MIL/uL (ref 3.87–5.11)
RDW: 12.5 % (ref 11.5–15.5)
WBC: 8.8 10*3/uL (ref 4.0–10.5)

## 2013-01-05 LAB — BASIC METABOLIC PANEL
BUN: 13 mg/dL (ref 6–23)
CO2: 29 mEq/L (ref 19–32)
Calcium: 9.4 mg/dL (ref 8.4–10.5)
Chloride: 102 mEq/L (ref 96–112)
Creatinine, Ser: 0.69 mg/dL (ref 0.50–1.10)
GFR calc Af Amer: >90 mL/min (ref >90)
GFR calc non Af Amer: >90 mL/min (ref >90)
Glucose, Bld: 90 mg/dL (ref 70–99)
Potassium: 3.7 mEq/L (ref 3.5–5.1)
Sodium: 141 mEq/L (ref 135–145)

## 2013-01-05 LAB — GRAM STAIN

## 2013-01-05 LAB — TROPONIN I: Troponin I: <0.3 ng/mL (ref <0.30)

## 2013-01-05 LAB — PROTEIN AND GLUCOSE, CSF
Glucose, CSF: 63 mg/dL (ref 43–76)
Total  Protein, CSF: 32 mg/dL (ref 15–45)

## 2013-01-05 MED ORDER — LORAZEPAM 2 MG/ML IJ SOLN
1.0000 mg | Freq: Once | INTRAMUSCULAR | Status: AC
  Administered 2013-01-05: 1 mg via INTRAVENOUS
  Filled 2013-01-05: qty 1

## 2013-01-05 MED ORDER — MORPHINE SULFATE 4 MG/ML IJ SOLN
4.0000 mg | Freq: Once | INTRAMUSCULAR | Status: AC
  Administered 2013-01-05: 4 mg via INTRAMUSCULAR
  Filled 2013-01-05: qty 1

## 2013-01-05 MED ORDER — IOHEXOL 350 MG/ML SOLN
50.0000 mL | Freq: Once | INTRAVENOUS | Status: AC | PRN
  Administered 2013-01-05: 50 mL via INTRAVENOUS

## 2013-01-05 MED ORDER — LORAZEPAM 0.5 MG PO TABS
0.5000 mg | ORAL_TABLET | Freq: Once | ORAL | Status: AC
  Administered 2013-01-05: 0.5 mg via ORAL
  Filled 2013-01-05: qty 1

## 2013-01-05 MED ORDER — LORAZEPAM 2 MG/ML IJ SOLN
1.0000 mg | Freq: Once | INTRAMUSCULAR | Status: AC
  Administered 2013-01-05: 1 mg via INTRAVENOUS

## 2013-01-05 NOTE — ED Provider Notes (Signed)
I saw this patient with the mid level provider. Patient complains of intermittent sharp stabbing pain that suddenly hits her in the back of the head lasting for a few seconds to a few minutes at a time. Family history of subarachnoid hemorrhage. Nonfocal neurological exam. No meningismus.  CT head is negative. Lumbar puncture performed in radiology shows increasing red blood cells from tube 1 to tube 3. No xanthochromia. Discussed with Dr. Bonnielee Haff he states he did have trouble obtaining CSF. CT angiogram is negative for aneurysm or dissection.  Evaluated patient with Dr. Jeral Fruit.  We agree that she appears well and blood in CSF is likely traumatic from procedure. She is agreeable to discharge and followup with neurollogy. Return precauations discussed inclluding worsening headache, confusion, vomiting, or any other concerns.  Glynn Octave, MD 01/06/13 [phone]

## 2013-01-05 NOTE — ED Notes (Signed)
To ED for intermittent back of head pinpoint pain. Pt felt this pain 5-6 times today. No injury noted. No neuro deficits. Hx of migraines. No pain now

## 2013-01-05 NOTE — ED Provider Notes (Signed)
History    This chart was scribed for non-physician practitioner Junius Finner, PA-C working with Glynn Octave, MD by Gerlean Ren, ED Scribe. This patient was seen in room TR06C/TR06C and the patient's care was started at 3:43 PM.    CSN: [id number]  Arrival date & time 01/05/13  1503   First MD Initiated Contact with Patient 01/05/13 1532      Chief Complaint  Patient presents with  . Headache  . Nausea     The history is provided by the patient. No language interpreter was used.  Tamara Cross is a 47 y.o. female who presents to the Emergency Department complaining of 3 episodes of sharp, sudden onset, pinpoint pain localized to posterior head that began while sitting at desk at work.  Associated nausea began after third episode that caused brief diarrhea.  Pt has not felt nauseated for past 15 minutes.  Pt has h/o migraines, last coming last week, but pt reports this pain is different in type and is more severe.  No associated otalgia, weakness, changes in vision, dyspnea, chest pain, abdominal pain.  H/o mitral valve prolapse.   Family h/o subarachnoid hemorrhage.  PCP is Dr. Debby Bud Pt has seen Dr. Graciela Husbands for cardiology. Past Medical History  Diagnosis Date  . Allergy     chroni rhinitis  . GERD (gastroesophageal reflux disease)   . Ulcer     previously had  . Heart murmur     MVP    Past Surgical History  Procedure Laterality Date  . Cesarean section  2002  . Appendectomy    . Tonsillectomy    . Cystectomy      left breast    Family History  Problem Relation Age of Onset  . Heart disease Father   . Hypertension Father   . Cancer Maternal Grandmother     colon cancer and her kinship    History  Substance Use Topics  . Smoking status: Former Smoker    Start date: 02/23/1998  . Smokeless tobacco: Never Used  . Alcohol Use: 2.5 oz/week    5 drink(s) per week    No OB history provided.   Review of Systems  Constitutional: Negative for fever.  HENT:  Negative for ear pain.   Eyes: Negative for visual disturbance.  Respiratory: Negative for shortness of breath.   Cardiovascular: Negative for chest pain.  Gastrointestinal: Positive for nausea.  Neurological: Positive for headaches. Negative for weakness.  All other systems reviewed and are negative.    Allergies  Sulfonamide derivatives  Home Medications   Current Outpatient Rx  Name  Route  Sig  Dispense  Refill  . clonazePAM (KLONOPIN) 0.5 MG tablet   Oral   Take 0.5 mg by mouth at bedtime as needed for anxiety.          [name] desvenlafaxine (PRISTIQ) 100 MG 24 hr tablet   Oral   Take 100 mg by mouth daily.           [name] lamoTRIgine (LAMICTAL) 100 MG tablet   Oral   Take 100 mg by mouth 2 (two) times daily.         . meloxicam (MOBIC) 15 MG tablet   Oral   Take 15 mg by mouth daily.         . metaxalone (SKELAXIN) 800 MG tablet   Oral   Take 400-800 mg by mouth 3 (three) times daily as needed for pain.         [name]  metoprolol succinate (TOPROL-XL) 50 MG 24 hr tablet      Take 1 tablet by mouth  daily with or immediately  following a meal   90 tablet   3   . oxyCODONE-acetaminophen (PERCOCET/ROXICET) 5-325 MG per tablet   Oral   Take 1 tablet by mouth every 4 (four) hours as needed for pain.         [name] HYDROcodone-acetaminophen (NORCO/VICODIN) 5-325 MG per tablet   Oral   Take 2 tablets by mouth every 4 (four) hours as needed for pain.   10 tablet   0   . ondansetron (ZOFRAN) 4 MG tablet   Oral   Take 1 tablet (4 mg total) by mouth every 6 (six) hours.   12 tablet   0     BP 177/83  Pulse 111  Temp(Src) 98.5 F (36.9 C) (Oral)  Resp 16  SpO2 97%  Physical Exam  Nursing note and vitals reviewed. Constitutional: She is oriented to person, place, and time. She appears well-developed and well-nourished. No distress.  HENT:  Head: Normocephalic and atraumatic.  Eyes: EOM are normal.  Neck: Neck supple. No tracheal deviation present.  Minor  c-spine tenderness to palpation at C7, pain with neck flexion.  Cardiovascular: Normal rate, regular rhythm and normal heart sounds.   No murmur heard. Pulmonary/Chest: Effort normal and breath sounds normal. No respiratory distress. She has no wheezes.  Abdominal: Soft. She exhibits no distension. There is no tenderness. There is no rebound and no guarding.  Musculoskeletal: Normal range of motion.  Neurological: She is alert and oriented to person, place, and time.  Skin: Skin is warm and dry.  Psychiatric: She has a normal mood and affect. Her behavior is normal.    ED Course  Procedures (including critical care time) DIAGNOSTIC STUDIES: Oxygen Saturation is 97% on room air, adequate by my interpretation.    COORDINATION OF CARE: 3:52 PM- Informed pt that I need to discuss case with attending in order to determine appropriate testing.  Offered Zofran and pain relief, pt refuses.     Date: 01/06/2013  Rate: 88  Rhythm: normal sinus rhythm  QRS Axis: normal  Intervals: normal  ST/T Wave abnormalities: normal  Conduction Disutrbances:none  Narrative Interpretation:   Old EKG Reviewed: none available    1. Headache       MDM  Pt had concerning hx for [facility] due to severe pain, although it came and went, pain was sudden onset, unlike previous migraines.  Pt also has FH (mother) of SAH.  Discussed pt with Dr. Manus Gunning.  Obtained head CT, LP, and CTA.  Due to cardiac hx of tachycardia, MVP, and vague symptoms of nausea and neck pain, decided to r/o ACS with ECG and troponin.  ECG: neg for STEMI.   Troponin: <0.3  Pt was anxious about LP, gave 0.5mg  ativan.  CT: No acute intracranial abnormalities LP: RBC 445, was increased from 1st tube of 28, so obtained CTA   CTA:   Dr. Manus Gunning discussed LP with radiologist who stated he did have some difficulty getting the needle in. See note from Dr. Manus Gunning  Dx: Headache  Rx: norco and zofran for home.  Discharged home with  husband.  Will f/u with primary care as needed.  Return to ED if sudden onset of HA occurs again.   Vitals: unremarkable. Discharged in stable condition.    Discussed pt with attending during ED encounter.   I personally performed the services described in this  documentation, which was scribed in my presence. The recorded information has been reviewed and is accurate.    Junius Finner, PA-C 01/06/13 0145

## 2013-01-05 NOTE — ED Notes (Signed)
Pt remains very anxious in CT. 1 mg Ativan given per MD orders

## 2013-01-05 NOTE — Procedures (Signed)
LP - Left L2-3 Opening P 19 cm water. No comp

## 2013-01-05 NOTE — ED Notes (Signed)
Pt anxious about results and speaking to MD. MD Rancour made aware. Family at bedside. Pt denies any pain.

## 2013-01-06 MED ORDER — ONDANSETRON HCL 4 MG PO TABS: 4.0000 mg | ORAL_TABLET | Freq: Four times a day (QID) | ORAL | Status: DC

## 2013-01-06 MED ORDER — HYDROCODONE-ACETAMINOPHEN 5-325 MG PO TABS: 2.0000 | ORAL_TABLET | ORAL | Status: DC | PRN

## 2013-01-06 NOTE — Consult Note (Signed)
Tamara Cross, CHUNN NO.:  [id number]  MEDICAL RECORD NO.:  [id number]  LOCATION:  B14C                         FACILITY:  [facility]  PHYSICIAN:  Hilda Lias, M.D.   DATE OF BIRTH:  12-30-1965  DATE OF CONSULTATION:  01/05/2013 DATE OF DISCHARGE:  01/06/2013                                CONSULTATION   The patient is in the emergency room.  Mrs. Coull is a lady who came today to the emergency room complaining of sudden onset of headache with bifrontal headache and some photophobia.  This lady has a history of migraines, but she felt that the headache at this time is quite different than before.  The patient has a history of tachycardia.  In the emergency room, she had CT scan of the head, which was essentially negative, but because of the history of her mother having subarachnoid hemorrhage, an LP was done, which showed increase of the red cell from the first to the 14th, but no changes in the color.  We were called for evaluation.  We agreed with the emergency room physician to go ahead and do a CT angiogram.  Clinically, the patient is awake and oriented.  She is complaining of some headache.  She has some mild photophobia.  NEUROLOGICAL EXAMINATION:  She is oriented x3.  She has no weakness. The cranial nerves are completely normal.  Pupils are normal and reactive.  Reflex is symmetrical.  Sensation is completely normal.  By the time I saw her, she was complaining of some lower back pain probably secondary to the spinal tap.  I reviewed the CT scan of the head, which was negative.  The spinal tap showed increase of the white cell, but with normal clear CSF.  The CT scan was reviewed with Dr. Bonnielee Haff from the X-ray Department and there was no evidence of aneurysm.  After the test and prior to the test, I talked to the patient and to the husband.  When we have all the results which were negative, we agreed with the patient to go home.  Of course, she is going to  have some mild headache, some mild lower back discomfort because of the spinal tap and they said the possibility that she might develop a spinal headache.  She is going to be sent by the emergency room doctor with some pain medication and to stay with warfarin for at least 24 hours.  She in the past had been seen by Dr. Melvyn Neth, who is a headache specialist and she was encouraged to see him again.  Also, she will be calling Dr. Debby Bud in relation about her tachycardia.  The hospital has my phone number, so they can call me anytime.  I reassured that so far all the tests are negative and there is no evidence of any surgical lesion.          ______________________________ Hilda Lias, M.D.     EB/MEDQ  D:  01/06/2013  T:  01/06/2013  Job:  [id number]

## 2013-01-06 NOTE — ED Provider Notes (Signed)
Medical screening examination/treatment/procedure(s) were conducted as a shared visit with non-physician practitioner(s) and myself.  I personally evaluated the patient during the encounter  See my additional note  Glynn Octave, MD 01/06/13 [phone]

## 2013-01-08 ENCOUNTER — Ambulatory Visit (INDEPENDENT_AMBULATORY_CARE_PROVIDER_SITE_OTHER): Payer: 59 | Admitting: Internal Medicine

## 2013-01-08 ENCOUNTER — Encounter: Payer: Self-pay | Admitting: Internal Medicine

## 2013-01-08 VITALS — BP 128/82 | HR 72 | Temp 98.4°F | Resp 16 | Ht 64.0 in | Wt 200.4 lb

## 2013-01-08 DIAGNOSIS — G43901 Migraine, unspecified, not intractable, with status migrainosus: Secondary | ICD-10-CM

## 2013-01-08 MED ORDER — NAPROXEN 500 MG PO TABS: 500.0000 mg | ORAL_TABLET | Freq: Three times a day (TID) | ORAL | Status: DC

## 2013-01-08 MED ORDER — MEPERIDINE HCL 25 MG/ML IJ SOLN
100.0000 mg | Freq: Once | INTRAMUSCULAR | Status: AC
  Administered 2013-01-08: 100 mg via INTRAMUSCULAR

## 2013-01-08 MED ORDER — PROMETHAZINE HCL 25 MG/ML IJ SOLN
25.0000 mg | Freq: Once | INTRAMUSCULAR | Status: AC
  Administered 2013-01-08: 25 mg via INTRAMUSCULAR

## 2013-01-08 MED ORDER — HYDROCODONE-ACETAMINOPHEN 10-325 MG PO TABS: 1.0000 | ORAL_TABLET | Freq: Three times a day (TID) | ORAL | Status: DC | PRN

## 2013-01-08 MED ORDER — MEPERIDINE HCL 25 MG/ML IJ SOLN: 100.0000 mg | Freq: Once | INTRAMUSCULAR | Status: DC

## 2013-01-08 MED ORDER — METHYLPREDNISOLONE ACETATE 80 MG/ML IJ SUSP
80.0000 mg | Freq: Once | INTRAMUSCULAR | Status: AC
  Administered 2013-01-08: 80 mg via INTRAMUSCULAR

## 2013-01-08 NOTE — Patient Instructions (Signed)
It was good to see you today. We've reviewed your ER visits including evaluation and labs/test results  Treatment today in the office: Demerol, promethazine and Depo-Medrol for treatment of migraine symptoms At home, use Norco 10 mg every 8 hours as needed for migraine and/or Naprosyn 500 mg every 8 hours with food as needed for migraine Your prescription(s) have been submitted to your pharmacy. Please take as directed and contact our office if you believe you are having problem(s) with the medication(s). Other medications reviewed and updated, no additional changes Call if symptoms worse or unimproved in next 72 hours Work excuse note provided as requested today

## 2013-01-08 NOTE — Progress Notes (Signed)
Subjective:    Patient ID: Tamara Cross, female    DOB: 05-Jun-1966, 47 y.o.   MRN: [id number]  HPI  Here for continued headache, now reports features are typical of usual migraine: pain in center of head from crown to posterior scalp Associated with photophobia, phonophobia Transiently improved with morphine and Zofran in the emergency room, untouched by Advil, Tylenol or hydrocodone Denies seizure, fever, head trauma or falls No new medications ER visit for same reviewed  Past Medical History  Diagnosis Date  . Allergy     chroni rhinitis  . GERD (gastroesophageal reflux disease)   . Ulcer     previously had  . Heart murmur     MVP  . Migraine     Review of Systems  Constitutional: Positive for fatigue. Negative for fever and unexpected weight change.  Allergic/Immunologic: Negative for immunocompromised state.  Neurological: Positive for seizures and headaches. Negative for tremors, syncope, weakness and numbness.  Psychiatric/Behavioral: Negative for hallucinations, behavioral problems, confusion and decreased concentration.       Objective:   Physical Exam BP 128/82  Pulse 72  Temp(Src) 98.4 F (36.9 C) (Oral)  Resp 16  Ht 5\' 4"  (1.626 m)  Wt 200 lb 6.4 oz (90.901 kg)  BMI 34.38 kg/m2  SpO2 98% Wt Readings from Last 3 Encounters:  01/08/13 200 lb 6.4 oz (90.901 kg)  10/19/12 206 lb 1.3 oz (93.477 kg)  08/26/12 205 lb 1.3 oz (93.024 kg)   Constitutional: She appears well-developed and well-nourished. No distress. spouse at side HENT: Head: Normocephalic and atraumatic. Ears: B TMs ok, no erythema or effusion; Nose: Nose normal. Mouth/Throat: Oropharynx is clear and moist. No oropharyngeal exudate.  Eyes: Conjunctivae and EOM are normal. Pupils are equal, round, and reactive to light. No scleral icterus.  Neck: Normal range of motion. Neck supple. No JVD present. No thyromegaly present.  Cardiovascular: Normal rate, regular rhythm and normal heart sounds.  No  murmur heard. No BLE edema. Pulmonary/Chest: Effort normal and breath sounds normal. No respiratory distress. She has no wheezes.  Neurological: She is alert and oriented to person, place, and time. No cranial nerve deficit. Coordination, speech, balance and recall are normal.  Skin: Skin is warm and dry. No rash noted. No erythema.  Psychiatric: She has a normal mood and affect. Her behavior is normal. Judgment and thought content normal.   Lab Results  Component Value Date   WBC 8.8 01/05/2013   HGB 14.2 01/05/2013   HCT 40.9 01/05/2013   PLT 276 01/05/2013   GLUCOSE 90 01/05/2013   CHOL 178 08/23/2008   TRIG 47 08/23/2008   HDL 52.7 08/23/2008   LDLCALC 116* 08/23/2008   ALT 22 06/22/2012   AST 25 06/22/2012   NA 141 01/05/2013   K 3.7 01/05/2013   CL 102 01/05/2013   CREATININE 0.69 01/05/2013   BUN 13 01/05/2013   CO2 29 01/05/2013   TSH 1.28 08/23/2008        Assessment & Plan:   Migraine - history of same Complicated by post-epidural symptoms ( LP in the emergency room 48 hours ago reviewed, back examined and normal) Neuro exam benign  Treat with IM Medrol 80+ Demerol 100/promethazine 25 today -spouse will take patient home Refill hydrocodone and use Naprosyn 500 mg every 8 hours when necessary in place of ibuprofen Reports ineffective relief with sumatriptan 6 been prescribed by neurology Dr. Vela Prose in past Excuse note provided as requested, patient to call if symptoms unimproved or  worse in next 72 hours  ER evaluation reviewed in depth including LP, CT results and neurosurgical consultation in ER Time spent with pt/family today 30 minutes, greater than 50% time spent counseling patient on migraine syndrome and medication review. Also review of ER records

## 2013-01-08 NOTE — Progress Notes (Signed)
Hydrocodone called to [facility] on [address]

## 2013-01-09 LAB — CSF CULTURE W GRAM STAIN: Culture: NO GROWTH

## 2013-01-11 ENCOUNTER — Ambulatory Visit: Payer: Self-pay | Admitting: Internal Medicine

## 2013-01-14 ENCOUNTER — Ambulatory Visit (INDEPENDENT_AMBULATORY_CARE_PROVIDER_SITE_OTHER): Payer: 59 | Admitting: Neurology

## 2013-01-14 ENCOUNTER — Encounter: Payer: Self-pay | Admitting: Neurology

## 2013-01-14 VITALS — BP 111/73 | HR 68 | Ht 64.0 in | Wt 200.0 lb

## 2013-01-14 DIAGNOSIS — R011 Cardiac murmur, unspecified: Secondary | ICD-10-CM | POA: Insufficient documentation

## 2013-01-14 DIAGNOSIS — G43909 Migraine, unspecified, not intractable, without status migrainosus: Secondary | ICD-10-CM

## 2013-01-14 DIAGNOSIS — T7840XA Allergy, unspecified, initial encounter: Secondary | ICD-10-CM | POA: Insufficient documentation

## 2013-01-14 DIAGNOSIS — K219 Gastro-esophageal reflux disease without esophagitis: Secondary | ICD-10-CM

## 2013-01-14 MED ORDER — RIBOFLAVIN 100 MG PO CAPS: 100.0000 mg | ORAL_CAPSULE | Freq: Two times a day (BID) | ORAL | Status: DC

## 2013-01-14 MED ORDER — MAGNESIUM OXIDE 400 (241.3 MG) MG PO TABS: 400.0000 mg | ORAL_TABLET | Freq: Every day | ORAL | Status: DC

## 2013-01-14 MED ORDER — SUMATRIPTAN SUCCINATE 100 MG PO TABS: 100.0000 mg | ORAL_TABLET | ORAL | Status: DC | PRN

## 2013-01-14 NOTE — Progress Notes (Signed)
Tamara Cross, [place] is a 47 yo RH WF, referred by emergency room for evaluation of frequent headaches  She had a past medical history of depression, is on polypharmacy treatment, including pristiq, Lamictal, clonazepam,    She reports a history of migraine headache since 47 years old, it happened occasionally at the beginning, gradually worse, getting worse over the past 2 years, she been having almost constant pressure headache, she complains of left temporal, vertex, left parietal region pressure headaches, 3-4 times each week, couple times each month, her headache would be exacerbated to a much severe pounding headache, with associated light noise sensitivity, nauseous, bending over or other movement will worsen  headaches,  She has one week absence from her job due to  frequent headaches, she suffered a GI symptoms about 2 weeks ago, followed by 1 week severe headaches, presented to the emergency room in April first 2014, CT head without contrast was normal, lumbar puncture showed elevated RBC, gradually clear up on consecutive CSF collections, tube 1 450, Tube 4 24, normal total protein, glucose, angiogram of head and neck was,    Her mother had a history of subarachnoid hemorrhage.  She is currently taking, Naproxen,  hydrocodone10/325, prn 3 day , muscle relaxant Skelaxin  800mg ,  which does help her headache, but she complains of unsteady gait,  she has never tried triptan treatment in the past,  Review of Systems  Out of a complete 14 system review, the patient complains of only the following symptoms, and all other reviewed systems are negative.   Constitutional:   Weight gain, fatigue Cardiovascular:  Palpation, cardiac murmur, swelling ankles Ear/Nose/Throat:  Ringing in ears. Skin: Moles Eyes: behind eye pain Respiratory: Snoring Gastroitestinal: Constipation    Hematology/Lymphatic:  N/A Endocrine:  N/A Musculoskeletal:N/A Allergy/Immunology: Easy bruising swollen lymph  nodes Neurological: Headaches, insomnia, Psychiatric:    Anxiety decreased energy  PHYSICAL EXAMINATOINS:  Generalized: In no acute distress  Neck: Supple, no carotid bruits   Cardiac: Regular rate rhythm  Pulmonary: Clear to auscultation bilaterally  Musculoskeletal: No deformity  Neurological examination  Mentation: Alert oriented to time, place, history taking, and causual conversation  Cranial nerve II-XII: Pupils were equal round reactive to light extraocular movements were full, visual field were full on confrontational test. facial sensation and strength were normal. hearing was intact to finger rubbing bilaterally. Uvula tongue midline.  head turning and shoulder shrug and were normal and symmetric.Tongue protrusion into cheek strength was normal.  Motor: normal tone, bulk and strength.  Sensory: Intact to fine touch, pinprick, preserved vibratory sensation, and proprioception at toes.  Coordination: Normal finger to nose, heel-to-shin bilaterally there was no truncal ataxia  Gait: Rising up from seated position without assistance, normal stance, without trunk ataxia, moderate stride, good arm swing, smooth turning, able to perform tiptoe, and heel walking without difficulty.   Romberg signs: Negative  Deep tendon reflexes: Brachioradialis 2/2, biceps 2/2, triceps 2/2, patellar 2/2, Achilles 2/2, plantar responses were flexor bilaterally.  Assessment and plan 47 years old right-handed Caucasian female, with long-standing history of headaches, worsening last couple years, also comorbidity of depression, on polypharmacy treatment, normal neurological examination, extensive evaluation including lumbar puncture, CT head, CT angiogram of head and brain was normal,  1 her headache is most consistent with migraine headaches, we will try preventive medication magnesium oxide, riboflavin. 2. Imitrex as needed. 3 she is also taking Pristiq , Lamictal for depression,. 4.  hydrocodone, naproxen, if Imitrex does not work. 5, Return to clinic with  Carolyn in 2-3 monos, will try other preventive medications, including Topamax, naproxen, even BOTOX if she continues to have headaches.

## 2013-01-18 DIAGNOSIS — Z0279 Encounter for issue of other medical certificate: Secondary | ICD-10-CM

## 2013-01-20 ENCOUNTER — Telehealth: Payer: Self-pay | Admitting: Neurology

## 2013-01-20 NOTE — Telephone Encounter (Signed)
Patient called and stated that doctor prescribed Imitrex on her 01-14-13 appt.  She took med for the first time on Sunday and within 15 minutes she had an anaphylactic reaction to the med.  She has been experiencing severe head pain since yesterday, and would like something for her headaches. Please advise.  [phone]

## 2013-01-20 NOTE — Telephone Encounter (Signed)
I have Left message

## 2013-01-21 MED ORDER — DICLOFENAC POTASSIUM(MIGRAINE) 50 MG PO PACK: 50.0000 mg | PACK | Freq: Two times a day (BID) | ORAL | Status: DC | PRN

## 2013-01-21 NOTE — Telephone Encounter (Signed)
Patient called back to say she missed the doctors call.  She is calling back to see if there is different medication to take for her headache.  She has been taking hydrocodone and naproxen, but said that the doctor didn't want her taking that.  She can be reached at [phone].

## 2013-01-21 NOTE — Telephone Encounter (Addendum)
I have called her, she still complains of  Frequent headaches  imitrex does help, tried once, she complains of throat swelling sensation, body heating up, goose bumps, weird sensation, lasting 4 hours, it does help her headaches, she also complains of difficulty breathing, difficulty swallowing, she has difficulty drinking coffee  She is allergic to bees, she had epi pens, she did epi pens, it makes her go to sleep, whoozy afterwards, anxiety with that as well.   I have called in [place]

## 2013-02-16 ENCOUNTER — Other Ambulatory Visit: Payer: Self-pay | Admitting: Obstetrics and Gynecology

## 2013-02-23 ENCOUNTER — Other Ambulatory Visit: Payer: Self-pay | Admitting: Obstetrics and Gynecology

## 2013-02-23 DIAGNOSIS — R928 Other abnormal and inconclusive findings on diagnostic imaging of breast: Secondary | ICD-10-CM

## 2013-03-08 ENCOUNTER — Ambulatory Visit
Admission: RE | Admit: 2013-03-08 | Discharge: 2013-03-08 | Disposition: A | Discharge status: Home / Self Care | Payer: 59 | Source: Ambulatory Visit | Attending: Obstetrics and Gynecology | Admitting: Obstetrics and Gynecology

## 2013-03-08 DIAGNOSIS — R928 Other abnormal and inconclusive findings on diagnostic imaging of breast: Secondary | ICD-10-CM

## 2013-03-30 ENCOUNTER — Ambulatory Visit: Payer: 59 | Admitting: Nurse Practitioner

## 2013-04-02 ENCOUNTER — Telehealth: Payer: Self-pay

## 2013-04-02 NOTE — Telephone Encounter (Signed)
Phone call from patient stating her blood pressure has been elevated today. Her highest reading today has been 189/107. She has taken her blood pressure medications as she is supposed to. Per Dr Debby Bud he will be happy to squeeze her in on his schedule for Monday. She was transferred to the front desk to schedule.

## 2013-04-05 ENCOUNTER — Ambulatory Visit: Payer: Self-pay | Admitting: Internal Medicine

## 2013-04-15 ENCOUNTER — Telehealth: Payer: Self-pay | Admitting: Nurse Practitioner

## 2013-04-26 ENCOUNTER — Ambulatory Visit: Payer: Self-pay | Admitting: Internal Medicine

## 2013-05-06 ENCOUNTER — Other Ambulatory Visit: Payer: Self-pay | Admitting: *Deleted

## 2013-05-06 ENCOUNTER — Ambulatory Visit: Payer: 59 | Admitting: Nurse Practitioner

## 2013-08-13 ENCOUNTER — Other Ambulatory Visit: Payer: Self-pay | Admitting: Internal Medicine

## 2013-09-21 ENCOUNTER — Encounter: Payer: Self-pay | Admitting: Internal Medicine

## 2013-09-21 ENCOUNTER — Ambulatory Visit (INDEPENDENT_AMBULATORY_CARE_PROVIDER_SITE_OTHER): Payer: 59 | Admitting: Internal Medicine

## 2013-09-21 VITALS — BP 110/72 | HR 63 | Temp 97.5°F | Wt 177.0 lb

## 2013-09-21 DIAGNOSIS — Z01818 Encounter for other preprocedural examination: Secondary | ICD-10-CM

## 2013-09-21 NOTE — Patient Instructions (Signed)
Pleasure seeing you today Tamara Cross!   1) You are medically cleared to have your shoulder repaired. We will write a letter and fax it over to Dr. Madelon Lips telling them this.   2) You do not need any blood work at this time. You had some blood drawn in April that was normal. You had an EKG earlier this year that was normal as well.

## 2013-09-21 NOTE — Progress Notes (Signed)
Pre visit review using our clinic review tool, if applicable. No additional management support is needed unless otherwise documented below in the visit note. 
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
## 2013-11-01 ENCOUNTER — Encounter: Payer: 59 | Admitting: Internal Medicine

## 2013-11-18 ENCOUNTER — Ambulatory Visit (INDEPENDENT_AMBULATORY_CARE_PROVIDER_SITE_OTHER): Payer: 59 | Admitting: Internal Medicine

## 2013-11-18 ENCOUNTER — Encounter: Payer: Self-pay | Admitting: Internal Medicine

## 2013-11-18 VITALS — BP 152/86 | HR 72 | Ht 64.0 in | Wt 185.0 lb

## 2013-11-18 DIAGNOSIS — R002 Palpitations: Secondary | ICD-10-CM

## 2013-11-18 MED ORDER — METOPROLOL SUCCINATE ER 50 MG PO TB24: 50.0000 mg | ORAL_TABLET | Freq: Every day | ORAL | Status: DC

## 2013-11-18 NOTE — Assessment & Plan Note (Signed)
The patient had palpitations that are reminiscent of palpitations she had before when, on my interpretation of her description of what I said, is most consistent with sinus tachycardia. These are due to abrupt in onset or offset were associated with any significant symptoms. We will follow her from before. Like to come in a  year

## 2013-11-18 NOTE — Patient Instructions (Signed)
Your physician recommends that you continue on your current medications as directed. Please refer to the Current Medication list given to you today.  Your physician wants you to follow-up in: 1 year with Dr. Klein.  You will receive a reminder letter in the mail two months in advance. If you don't receive a letter, please call our office to schedule the follow-up appointment.  

## 2013-11-18 NOTE — Progress Notes (Signed)
      Patient Care Team: Neena Rhymes, MD as PCP - General (Internal Medicine)   HPI  Tamara Cross is a 48 y.o. female Seen after a hiatus of many years. She was noted years ago that palpitations and placed on her description as no notes are available it sounded like she is having sinus tachycardia.      She has been having more of this and given the fact htat she is getting older she was concerned  She denies exercise chest pain edema PND or orthopnea  She does have some DOE    No syncope   She carries a diagnosis of mitral valve prolapse although we do not have an echocardiogram in the current records Past Medical History  Diagnosis Date  . Allergy     chroni rhinitis  . GERD (gastroesophageal reflux disease)   . Ulcer     previously had  . Heart murmur     MVP  . Migraine     Past Surgical History  Procedure Laterality Date  . Cesarean section  2002  . Appendectomy    . Tonsillectomy    . Cystectomy      left breast    Current Outpatient Prescriptions  Medication Sig Dispense Refill  . clonazePAM (KLONOPIN) 0.5 MG tablet Take 0.5 mg by mouth at bedtime as needed for anxiety.       [name] desvenlafaxine (PRISTIQ) 100 MG 24 hr tablet Take 100 mg by mouth daily.        . hydrocortisone (ANUSOL-HC) 2.5 % rectal cream Place 1 application rectally as needed.       [name] HYDROmorphone (DILAUDID) 2 MG tablet 3 (three) times daily as needed.      [name] ibuprofen (ADVIL,MOTRIN) 800 MG tablet as needed.      . lamoTRIgine (LAMICTAL) 100 MG tablet Take 100 mg by mouth 2 (two) times daily.      . methocarbamol (ROBAXIN) 750 MG tablet Take 750 mg by mouth 4 (four) times daily.      . metoprolol succinate (TOPROL-XL) 50 MG 24 hr tablet Take 1 tablet by mouth  daily with or immediately  following a meal  90 tablet  3  . traZODone (DESYREL) 100 MG tablet at bedtime.       . valACYclovir (VALTREX) 500 MG tablet as needed.       [name] ZOVIRAX 5 % as needed.       No current  facility-administered medications for this visit.    Allergies  Allergen Reactions  . Sulfonamide Derivatives Shortness Of Breath  . Imitrex [Sumatriptan]     Throat swells  . Reglan [Metoclopramide]     Possible cause of SVT.    Review of Systems negative except from HPI and PMH  Physical Exam BP 152/86  Pulse 72  Ht 5\' 4"  (1.626 m)  Wt 185 lb (83.915 kg)  BMI 31.74 kg/m2 Well developed and nourished in no acute distress HENT normal Neck supple with JVP-flat Carotids brisk and full without bruits Clear Regular rate and rhythm, no murmurs or gallops Abd-soft with active BS without hepatomegaly No Clubbing cyanosis edema Skin-warm and dry A & Oriented  Grossly normal sensory and motor function eCG >>NSR with normal intervals Assessment and  Plan

## 2013-12-15 NOTE — Telephone Encounter (Signed)
Pt was a no show closing encounter

## 2013-12-18 ENCOUNTER — Encounter: Payer: Self-pay | Admitting: Internal Medicine

## 2014-01-03 ENCOUNTER — Other Ambulatory Visit: Payer: Self-pay | Admitting: Orthopedic Surgery

## 2014-01-03 ENCOUNTER — Ambulatory Visit
Admission: RE | Admit: 2014-01-03 | Discharge: 2014-01-03 | Disposition: A | Discharge status: Home / Self Care | Payer: 59 | Source: Ambulatory Visit | Attending: Orthopedic Surgery | Admitting: Orthopedic Surgery

## 2014-01-03 DIAGNOSIS — M25572 Pain in left ankle and joints of left foot: Secondary | ICD-10-CM

## 2014-02-21 ENCOUNTER — Telehealth: Payer: Self-pay | Admitting: Internal Medicine

## 2014-02-21 NOTE — Telephone Encounter (Signed)
Pt called stated that Dr. Linda Hedges told her that Dr. Camila Li will be here PCP after he retired. Please advise, pt request CPE.

## 2014-02-22 NOTE — Telephone Encounter (Signed)
I'm not aware of it. Pls sch CPX w/PCP that Ms Hafford was assigned to. Thx

## 2014-02-24 ENCOUNTER — Other Ambulatory Visit: Payer: Self-pay | Admitting: Obstetrics and Gynecology

## 2014-02-24 DIAGNOSIS — R928 Other abnormal and inconclusive findings on diagnostic imaging of breast: Secondary | ICD-10-CM

## 2014-03-01 NOTE — Telephone Encounter (Signed)
Adero is taking care of this.

## 2014-03-10 ENCOUNTER — Other Ambulatory Visit: Payer: Self-pay | Admitting: Obstetrics and Gynecology

## 2014-03-10 ENCOUNTER — Ambulatory Visit
Admission: RE | Admit: 2014-03-10 | Discharge: 2014-03-10 | Disposition: A | Discharge status: Home / Self Care | Payer: 59 | Source: Ambulatory Visit | Attending: Obstetrics and Gynecology | Admitting: Obstetrics and Gynecology

## 2014-03-10 ENCOUNTER — Encounter (INDEPENDENT_AMBULATORY_CARE_PROVIDER_SITE_OTHER): Payer: Self-pay

## 2014-03-10 ENCOUNTER — Ambulatory Visit: Payer: Self-pay

## 2014-03-10 DIAGNOSIS — R921 Mammographic calcification found on diagnostic imaging of breast: Secondary | ICD-10-CM

## 2014-03-10 DIAGNOSIS — R928 Other abnormal and inconclusive findings on diagnostic imaging of breast: Secondary | ICD-10-CM

## 2014-03-28 ENCOUNTER — Ambulatory Visit
Admission: RE | Admit: 2014-03-28 | Discharge: 2014-03-28 | Disposition: A | Discharge status: Home / Self Care | Payer: 59 | Source: Ambulatory Visit | Attending: Obstetrics and Gynecology | Admitting: Obstetrics and Gynecology

## 2014-03-28 DIAGNOSIS — R921 Mammographic calcification found on diagnostic imaging of breast: Secondary | ICD-10-CM

## 2014-03-28 DIAGNOSIS — C50919 Malignant neoplasm of unspecified site of unspecified female breast: Secondary | ICD-10-CM

## 2014-03-28 HISTORY — DX: Malignant neoplasm of unspecified site of unspecified female breast: C50.919

## 2014-03-29 ENCOUNTER — Other Ambulatory Visit: Payer: Self-pay | Admitting: Obstetrics and Gynecology

## 2014-03-29 DIAGNOSIS — C50919 Malignant neoplasm of unspecified site of unspecified female breast: Secondary | ICD-10-CM

## 2014-04-01 ENCOUNTER — Ambulatory Visit (INDEPENDENT_AMBULATORY_CARE_PROVIDER_SITE_OTHER): Payer: 59 | Admitting: Surgery

## 2014-04-01 ENCOUNTER — Encounter (INDEPENDENT_AMBULATORY_CARE_PROVIDER_SITE_OTHER): Payer: Self-pay | Admitting: Surgery

## 2014-04-01 ENCOUNTER — Other Ambulatory Visit (INDEPENDENT_AMBULATORY_CARE_PROVIDER_SITE_OTHER): Payer: Self-pay

## 2014-04-01 VITALS — BP 126/86 | HR 68 | Temp 98.0°F | Resp 14 | Ht 64.5 in | Wt 181.0 lb

## 2014-04-01 DIAGNOSIS — C50919 Malignant neoplasm of unspecified site of unspecified female breast: Secondary | ICD-10-CM

## 2014-04-01 DIAGNOSIS — D0591 Unspecified type of carcinoma in situ of right breast: Secondary | ICD-10-CM

## 2014-04-01 DIAGNOSIS — C50911 Malignant neoplasm of unspecified site of right female breast: Secondary | ICD-10-CM

## 2014-04-01 DIAGNOSIS — D0590 Unspecified type of carcinoma in situ of unspecified breast: Secondary | ICD-10-CM

## 2014-04-01 NOTE — Progress Notes (Addendum)
Re:   Tamara Cross DOB:   Nov 11, 1965 MRN:   [id number]  ASSESSMENT AND PLAN: 1.  Right breast cancer, DCIS, upper outer quadrant [drawing at end of note]  Biopsy - 03/28/2014 ([id number]) - Right breast - DCIS, Left breast - [facility]  I discussed the options for breast cancer treatment with the patient.  I discussed a multidisciplinary approach to the treatment of breast cancer, which includes medical oncology and radiation oncology.  I discussed the surgical options of lumpectomy vs. mastectomy.  If mastectomy, there is the possibility of reconstruction.   I discussed the options of lymph node biopsy.  The treatment plan depends on the pathologic staging of the tumor and the patient's personal wishes.  The risks of surgery include, but are not limited to, bleeding, infection, the need for further surgery, and nerve injury.  The patient has been given literature on the treatment of breast cancer.  Plan:  1) , 2) We will have her see the rad oncologist pre op, 3) Then localization biopsy of both breast, 4) After the MRI, will discuss with radiology about the 4 mm calcs in the left breast which have not been biopsied.  [Presented at breast cancer conf.  She will see Dr. Isidore Moos next week.  Her MRI showed - 1) Second area on right breast 8 mm anterior to biopsied cancer, 2) new area left breast to biopsy, 3) Microca++ (that they were going to watch) will now be biopsied.  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 Surgical excision of the atypical lobular hyperplasia is recommended.  Left breast at 9 o'clock a fibroadenoma was reported histologically.  DN 04/13/2014]  [Biopsy ([id number]) - 04/14/2014 - Right core biopsy - negative, microcalcs seen. I spoke to patient and husband by phone.  Will review with [facility] and schedule surgery.  DN 04/17/2014]  [I reviewed the  images with S. Turner.  She will need two wire locs on right (UOQ) and two wire locs on left (12 and 2 o'clock).  Discussed with patient.  DN 04/18/2014]  2.  Left Breast, atypical lobular hyperplasia, upper outer quadrant  This will require wider excision. 3.  Depression and anxiety 4.  History of tachycardias with shoulder surgery.  Chief Complaint  Patient presents with  . Breast Mass    new pt- eval rt breast MCIS and lt breast ALH   REFERRING PHYSICIAN: Adella Hare, MD  HISTORY OF PRESENT ILLNESS: Tamara Cross is a 48 y.o. (DOB: 1966/09/29)  white  female whose primary care physician is Adella Hare, MD and comes to me today for right breast cancer. She is accompanied by her husband. They are going to the beach and are a little impatient with my tardiness.  She had a mass removed from her left breast in her 36's (probably fibroadenoma).  She said more recently she had a small cyst of her left breast that has been watched.  It never required a biopsy.  She has not had significant breast disease.  No fam hx of breast cancer, though she has a strong fam hx of colon ca.  She is not on hormones.  She had her last menstrual period around 2013.  Mammogram on 03/10/2014 - 1. Indeterminate calcifications in the upper-outer right breast mid  to posterior depth. 2. Indeterminate calcifications in the upper-outer left breast mid to posterior depth. 3. Probably benign 4 mm group  of calcifications in the superior left breast anterior depth.  Biopsy - 03/28/[phone]) - Right breast - DCIS, Left breast - [facility]   Past Medical History  Diagnosis Date  . Allergy     chroni rhinitis  . GERD (gastroesophageal reflux disease)   . Ulcer     previously had  . Heart murmur     MVP  . Migraine   . Arthritis      Past Surgical History  Procedure Laterality Date  . Cesarean section  2002  . Appendectomy    . Tonsillectomy    . Cystectomy      left breast  . Breast lumpectomy    .  Shoulder surgery       Current Outpatient Prescriptions  Medication Sig Dispense Refill  . clonazePAM (KLONOPIN) 0.5 MG tablet Take 0.5 mg by mouth at bedtime as needed for anxiety.       [name] desvenlafaxine (PRISTIQ) 100 MG 24 hr tablet Take 100 mg by mouth daily.        [name] lamoTRIgine (LAMICTAL) 100 MG tablet Take 100 mg by mouth 2 (two) times daily.      . metoprolol succinate (TOPROL-XL) 50 MG 24 hr tablet Take 1 tablet (50 mg total) by mouth daily. Take with or immediately following a meal.  90 tablet  0  . traZODone (DESYREL) 100 MG tablet at bedtime.       . valACYclovir (VALTREX) 500 MG tablet as needed.        No current facility-administered medications for this visit.      Allergies  Allergen Reactions  . Sulfonamide Derivatives Shortness Of Breath  . Imitrex [Sumatriptan]     Throat swells  . Reglan [Metoclopramide]     Possible cause of SVT.    REVIEW OF SYSTEMS: Skin:  No history of rash.  No history of abnormal moles. Infection:  No history of hepatitis or HIV.  No history of MRSA. Neurologic:  No history of stroke.  No history of seizure.  No history of headaches. Cardiac:  MVP.  Sees Dr. Olin Pia.  Has history of tachycardia in recovery room post shoulder surgery. Pulmonary:  Smoked in her 70 and 30's.  Now smokes socially.    Endocrine:  No diabetes. No thyroid disease. Gastrointestinal:  History of PUD and HH.  Her PUD is resolved.  Nexium controls symptoms from the [facility].  No history of liver disease.  No history of gall bladder disease.  No history of pancreas disease.  History of appendectomy.  Strong family history of colon ca - gets regular colonoscopies by Dr. Olevia Perches.  Last colonoscopy about 2010. Urologic:  No history of kidney stones.  No history of bladder infections. GYN:  Sees Dr. Jerilynn Mages. Anderson. Musculoskeletal:  Left shoulder surgery for impingement by Dr. French Ana - Jan and Jhordan Kinter, 2015 Hematologic:  No bleeding disorder.  No history of anemia.  Not  anticoagulated. Psycho-social:  Depression and anxiety  SOCIAL and FAMILY HISTORY: Married. She works for [employer] with her. Has on son who is about to turn 79. Her mother in law, who was with her, had Stage 3 breast cancer about 6 years ago and is doing well.  PHYSICAL EXAM: BP 126/86  Pulse 68  Temp(Src) 98 F (36.7 C) (Temporal)  Resp 14  Ht 5' 4.5" (1.638 m)  Wt 181 lb (82.101 kg)  BMI 30.60 kg/m2  General: WN WF who is alert and generally healthy appearing.  HEENT: Normal. Pupils  equal. Neck: Supple. No mass.  No thyroid mass. Lymph Nodes:  No supraclavicular or cervical nodes. Lungs: Clear to auscultation and symmetric breath sounds. Heart:  RRR. No murmur or rub. Breast:  Right - upper outer quadrant bruise  Left - Upper outer quadrant bruise Abdomen: Soft. No mass. No tenderness. No hernia. Normal bowel sounds.  No abdominal scars. Rectal: Not done. Extremities:  Good strength and ROM  in upper and lower extremities. Neurologic:  Grossly intact to motor and sensory function. Psychiatric: Has normal mood and affect. Behavior is normal.    Drawing of findings (I can't get the image to rotate).  DATA REVIEWED: Epic notes.  Alphonsa Overall, MD,  [facility], [address]  [place], [place]    [place] Phone:  [phone] FAX:  [phone]

## 2014-04-05 ENCOUNTER — Ambulatory Visit
Admission: RE | Admit: 2014-04-05 | Discharge: 2014-04-05 | Disposition: A | Discharge status: Home / Self Care | Payer: 59 | Source: Ambulatory Visit | Attending: Obstetrics and Gynecology | Admitting: Obstetrics and Gynecology

## 2014-04-05 MED ORDER — GADOBENATE DIMEGLUMINE 529 MG/ML IV SOLN
17.0000 mL | Freq: Once | INTRAVENOUS | Status: AC | PRN
  Administered 2014-04-05: 17 mL via INTRAVENOUS

## 2014-04-06 ENCOUNTER — Other Ambulatory Visit (INDEPENDENT_AMBULATORY_CARE_PROVIDER_SITE_OTHER): Payer: Self-pay | Admitting: Surgery

## 2014-04-06 DIAGNOSIS — R921 Mammographic calcification found on diagnostic imaging of breast: Secondary | ICD-10-CM

## 2014-04-06 DIAGNOSIS — R928 Other abnormal and inconclusive findings on diagnostic imaging of breast: Secondary | ICD-10-CM

## 2014-04-11 ENCOUNTER — Encounter: Payer: Self-pay | Admitting: Radiation Oncology

## 2014-04-11 NOTE — Progress Notes (Signed)
Location of Breast Cancer:Left Breast, Upper Outer Quadrant  Histology per Pathology Report:   03/28/14 Diagnosis 1. Breast, left, needle core biopsy, UOQ - LOBULAR NEOPLASIA (ATYPICAL LOBULAR HYPERPLASIA), SEE COMMENT. - CALCIFICATIONS IDENTIFIED. 2. Breast, right, needle core biopsy, UOQ - MAMMARY CARCINOMA IN SITU, SEE COMMENT. - CALCIFICATIONS IDENTIFIED. Microscopic Comment 1. Multiple needle core biopsies demonstrate foci of atypical The in situ carcinoma demonstrates strong diffuse E-cadherin expression; supportive of a ductal origin  Receptor Status: ER(100%), PR (65%), Her2-neu ()  Presentation: She had a mass removed from her left breast in her 20's (probably fibroadenoma). She said more recently she had a small cyst of her left breast that has been watched. It never required a biopsy. She has not had significant breast disease.   Mammogram on 03/10/2014 - 1. Indeterminate calcifications in the upper-outer right breast mid  to posterior depth. 2. Indeterminate calcifications in the upper-outer left breast mid to posterior depth. 3. Probably benign 4 mm group of calcifications in the superior left breast anterior depth   Past/Anticipated interventions by surgeon, if any: Dr. Alphonsa Overall - Left Breast Needle core Biopsy - UOQ  Past/Anticipated interventions by medical oncology, if any: Chemotherapy   Lymphedema issues, if any:   Pain issues, if any:    SAFETY ISSUES:  Prior radiation? NO  Pacemaker/ICD? NO   Possible current pregnancy? NO  Is the patient on methotrexate? NO  Current Complaints / other details:  G1.P1, No hormonal use.  Last Menstraul cycle 2013. Hotflashes    Deirdre Evener, RN 04/11/2014,12:08 PM

## 2014-04-12 ENCOUNTER — Encounter (INDEPENDENT_AMBULATORY_CARE_PROVIDER_SITE_OTHER): Payer: Self-pay

## 2014-04-12 ENCOUNTER — Ambulatory Visit
Admission: RE | Admit: 2014-04-12 | Discharge: 2014-04-12 | Disposition: A | Discharge status: Home / Self Care | Payer: 59 | Source: Ambulatory Visit | Attending: Surgery | Admitting: Surgery

## 2014-04-12 ENCOUNTER — Other Ambulatory Visit (INDEPENDENT_AMBULATORY_CARE_PROVIDER_SITE_OTHER): Payer: Self-pay | Admitting: Surgery

## 2014-04-12 DIAGNOSIS — R921 Mammographic calcification found on diagnostic imaging of breast: Secondary | ICD-10-CM

## 2014-04-12 DIAGNOSIS — R928 Other abnormal and inconclusive findings on diagnostic imaging of breast: Secondary | ICD-10-CM

## 2014-04-12 NOTE — Progress Notes (Signed)
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 female who was found by screening (annaul) mammography on 03-10-14 to have indeterminate calcifications in the UOQs of both the right and left breasts. Probably benign 71mm area of calcifications in the superior left breast as well.  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    MRI 04/05/14 of breasts revealed 2 suspicious nodules in the right breast 8 mm anterior to biopsied site and a left breast UIQ 1.5cm enhancing mass.   No abnormal nodes.   I believe that Dr. Lucia Gaskins plans to obtain further biopsies of the suspicious areas in both breasts from MRI including biopsy at the 2mm region of calcifications of the left breast noted in her mammogram.  If she pursues breast conservation she will undergo eventual excision of the [facility] in the left breast, and right breast DCIS.  I don't have any reports yet, but patient reports two of these biopsies were done yesterday (left lateral and medial breast). Tomorrow she anticipates R breast MRI guided biopsy.  She is otherwise in good health.  PREVIOUS RADIATION THERAPY: No  PAST MEDICAL HISTORY:  has a past medical history of Allergy; GERD (gastroesophageal reflux disease); Ulcer; Heart murmur; Migraine; Arthritis; and Breast cancer (03/28/14).    PAST SURGICAL HISTORY: Past Surgical History  Procedure Laterality Date  . Cesarean section  2002  . Appendectomy      . Tonsillectomy    . Cystectomy      left breast  . Breast lumpectomy    . Shoulder surgery      FAMILY HISTORY: family history includes Cancer in her maternal grandmother; Heart disease in her father; Hypertension in her father.  SOCIAL HISTORY:  reports that she has been smoking.  She started smoking about 16 years ago. She has never used smokeless tobacco. She reports that she drinks about 3 ounces of alcohol per week. She reports that she does not use illicit drugs. [facility]. One son. Here with husband. Works for [employer]: Sulfonamide derivatives; Imitrex; Reglan; and Zofran  MEDICATIONS:  Current Outpatient Prescriptions  Medication Sig Dispense Refill  . clonazePAM (KLONOPIN) 0.5 MG tablet Take 0.5 mg by mouth at bedtime as needed for anxiety.       [name] desvenlafaxine (PRISTIQ) 100 MG 24 hr tablet Take 100 mg by mouth daily.        [name] lamoTRIgine (LAMICTAL) 100 MG tablet Take 100 mg by mouth 2 (two) times daily.      . metoprolol succinate (TOPROL-XL) 50 MG 24 hr tablet Take 1 tablet (50 mg total) by mouth daily. Take with or immediately following a meal.  90 tablet  0  . traZODone (DESYREL) 100 MG tablet at bedtime.       . valACYclovir (VALTREX) 500 MG tablet as needed.        No current facility-administered medications for this encounter.    REVIEW OF SYSTEMS:  Notable  for that above.   PHYSICAL EXAM:  height is 5' 4.5" (1.638 m) and weight is 186 lb 1.6 oz (84.414 kg). Her temperature is 98.6 F (37 C). Her blood pressure is 130/80 and her pulse is 80.   General: Alert and oriented, in no acute distress HEENT: Head is normocephalic.   Extraocular movements are intact. Oropharynx is clear. Neck: Neck is supple, no palpable cervical or supraclavicular lymphadenopathy. Heart: Regular in rate and rhythm with no murmurs, rubs, or gallops. Chest: Clear to auscultation bilaterally, with no rhonchi, wheezes, or rales. Abdomen: Soft, nontender, nondistended,  with no rigidity or guarding. Extremities: No cyanosis or edema. Thigh mass (see below) Lymphatics: No concerning lymphadenopathy. Skin:  thigh mass today. She says it has grown. The overlying skin has telangiectasias that blanch.  I cannot recall at time of documentation which side this was on. Musculoskeletal: symmetric strength and muscle tone throughout. See skin above. Neurologic: Cranial nerves II through XII are grossly intact. No obvious focalities. Speech is fluent. Coordination is intact. Psychiatric: Judgment and insight are intact. Affect is appropriate. Breasts: no palpable tumor or axillary adenopathy appreciated on right. Left breast - post biopsy bandaging, bloody. Did not palpate this side.   ECOG = 0  0 - Asymptomatic (Fully active, able to carry on all predisease activities without restriction)  1 - Symptomatic but completely ambulatory (Restricted in physically strenuous activity but ambulatory and able to carry out work of a light or sedentary nature. For example, light housework, office work)  2 - Symptomatic, <50% in bed during the day (Ambulatory and capable of all self care but unable to carry out any work activities. Up and about more than 50% of waking hours)  3 - Symptomatic, >50% in bed, but not bedbound (Capable of only limited self-care, confined to bed or chair 50% or more of waking hours)  4 - Bedbound (Completely disabled. Cannot carry on any self-care. Totally confined to bed or chair)  5 - Death   Eustace Pen MM, Creech RH, Tormey DC, et al. [phone]). "Toxicity and response criteria of the [facility]". [place] Oncol. 5 (6): 649-55   LABORATORY DATA:  Lab Results  Component Value Date   WBC 8.8 01/05/2013   HGB 14.2 01/05/2013   HCT 40.9 01/05/2013   MCV 89.1 01/05/2013   PLT 276 01/05/2013   CMP     Component Value Date/Time   NA 141 01/05/2013 1615   K 3.7 01/05/2013 1615   CL 102 01/05/2013 1615   CO2 29 01/05/2013 1615   GLUCOSE 90  01/05/2013 1615   BUN 13 01/05/2013 1615   CREATININE 0.69 01/05/2013 1615   CALCIUM 9.4 01/05/2013 1615   PROT 6.4 06/22/2012 1709   ALBUMIN 3.8 06/22/2012 1709   AST 25 06/22/2012 1709   ALT 22 06/22/2012 1709   ALKPHOS 62 06/22/2012 1709   BILITOT 0.4 06/22/2012 1709   GFRNONAA >90 01/05/2013 1615   GFRAA >90 01/05/2013 1615         RADIOGRAPHY: Mr Breast Bilateral W Wo Contrast  04/05/2014   CLINICAL DATA:  Recently diagnosed right breast mammary carcinoma in situ on stereotactic biopsy and left breast atypical lobular hyperplasia noted on stereotactic biopsy. Pre-surgical evaluation.  EXAM: BILATERAL BREAST MRI WITH AND WITHOUT CONTRAST  TECHNIQUE: Multiplanar, multisequence MR images of both breasts were obtained prior to and following the intravenous administration of 4ml of MultiHance  THREE-DIMENSIONAL MR IMAGE RENDERING ON INDEPENDENT WORKSTATION:  Three-dimensional MR images  were rendered by post-processing of the original MR data on an independent workstation. The three-dimensional MR images were interpreted, and findings are reported in the following complete MRI report for this study. Three dimensional images were evaluated at the independent DynaCad workstation  COMPARISON:  Mammograms dated 03/28/2014, 03/10/2014, 02/21/2014, 02/16/2013.  FINDINGS: Breast composition: c:  Heterogeneous fibroglandular tissue  Background parenchymal enhancement: Moderate to marked  Right breast: Within the right upper outer quadrant of the right breast is post biopsy change with central clip artifact related to the recent right breast stereotactic core biopsy. Located 8 mm anterior to this clip are two adjacent irregular enhancing nodules measuring 9 x 8 x 6 mm in size and 6 x 8 x 5 mm in size. These are suspicious nodules. There are no additional worrisome enhancing foci within the right breast. If breast conservation is planned, recommend MR guided biopsy of the larger of the 2 adjacent nodules. There are multiple  cysts present.  Left breast: There is post biopsy change within the upper-outer quadrant left breast with central clip artifact corresponding to the left breast stereotactic core biopsy. Located within the upper inner quadrant of the medial left breast (anterior 1/3) is a 1.5 x 1.1 x 1.0 cm oval, circumscribed enhancing mass with central septations. This may represent a fibroadenoma. However, this is not well visualized on the previous mammogram dated 02/16/2013 and I recommend second-look ultrasound for further evaluation of this lesion. There are multiple cysts present. There are no additional findings.  Lymph nodes: No abnormal appearing lymph nodes.  Ancillary findings:  None  IMPRESSION: 1. Post biopsy change within the upper outer quadrant of the right breast with central clip artifact related to been patient's known mammary carcinoma in situ. Located 8 mm anterior to the clip are 2 adjacent enhancing irregular masses measuring 9 and 8 mm in size. If breast conservation is planned, recommend MR guided biopsy of the larger of these similar adjacent nodules. 2. 1.5 cm circumscribed enhancing mass with in the medial left breast (upper inner quadrant). Recommend second-look ultrasound of this mass as discussed above. 3. In the area of the patient's bilateral stereotactic biopsies there is post biopsy change but no worrisome enhancement.  RECOMMENDATION: Left breast second-look ultrasound. Right breast MR guided biopsy if breast conservation is planned.  BI-RADS CATEGORY  4: Suspicious.   Electronically Signed   By: Luberta Robertson M.D.   On: 04/05/2014 12:21   Mm Lt Breast Bx W Loc Dev 1st Lesion Image Bx Spec Stereo Guide  03/29/2014   ADDENDUM REPORT: 03/29/2014 13:43  ADDENDUM: Pathologic results indicate atypical lobular hyperplasia on the left and in situ carcinoma on the right. Surgical excisional biopsy on the left is recommended with appropriate surgical therapy of the right breast. The patient has been  scheduled for an appointment with Dr. Lucia Gaskins at [facility] surgery on 04/01/2014. As we discussed, she will be contacted to schedule MRI as well. She indicated mild tenderness at the biopsy sites with no complications.   Electronically Signed   By: Skipper Cliche M.D.   On: 03/29/2014 13:43   03/29/2014   CLINICAL DATA:  Calcifications upper-outer quadrant left breast  EXAM: LEFT BREAST STEREOTACTIC CORE NEEDLE BIOPSY  COMPARISON:  Previous exams.  FINDINGS: The patient and I discussed the procedure of stereotactic-guided biopsy including benefits and alternatives. We discussed the high likelihood of a successful procedure. We discussed the risks of the procedure including infection, bleeding, tissue injury, clip migration, and inadequate sampling.  Informed written consent was given. The usual time out protocol was performed immediately prior to the procedure.  Using sterile technique and 2% Lidocaine as local anesthetic, under stereotactic guidance, a 9 gauge vacuum assisted device was used to perform core needle biopsy of calcifications in the upper-outer quadrant of the left breast using a craniocaudal approach. Specimen radiograph was performed showing numerous calcifications. Specimens with calcifications are identified for pathology.  At the conclusion of the procedure, a X shaped tissue marker clip was deployed into the biopsy cavity. Follow-up 2-view mammogram confirmed clip to be in correct position.  IMPRESSION: Stereotactic-guided biopsy of left breast calcifications. No apparent complications.  Electronically Signed: By: Skipper Cliche M.D. On: 03/28/2014 09:45   Mm Rt Breast Bx W Loc Dev 1st Lesion Image Bx Spec Stereo Guide  03/29/2014   ADDENDUM REPORT: 03/29/2014 13:44  ADDENDUM: Pathologic results indicate atypical lobular hyperplasia on the left and in situ carcinoma on the right. Surgical excisional biopsy on the left is recommended with appropriate surgical therapy of the right  breast. The patient has been scheduled for an appointment with Dr. Lucia Gaskins at [facility] surgery on 04/01/2014. As we discussed, she will be contacted to schedule MRI as well. She indicated mild tenderness at the biopsy sites with no complications.   Electronically Signed   By: Skipper Cliche M.D.   On: 03/29/2014 13:44   03/29/2014   CLINICAL DATA:  Calcifications right upper outer quadrant  EXAM: RIGHT BREAST STEREOTACTIC CORE NEEDLE BIOPSY  COMPARISON:  Previous exams.  FINDINGS: The patient and I discussed the procedure of stereotactic-guided biopsy including benefits and alternatives. We discussed the high likelihood of a successful procedure. We discussed the risks of the procedure including infection, bleeding, tissue injury, clip migration, and inadequate sampling. Informed written consent was given. The usual time out protocol was performed immediately prior to the procedure.  Using sterile technique and 2% Lidocaine as local anesthetic, under stereotactic guidance, a 9 gauge vacuum assisted device was used to perform core needle biopsy of calcifications in the upper outer quadrant using a craniocaudal approach. Specimen radiograph was performed showing numerous calcifications. Specimens with calcifications are identified for pathology.  At the conclusion of the procedure, a X shaped tissue marker clip was deployed into the biopsy cavity. Follow-up 2-view mammogram confirmed clip in correct position.  IMPRESSION: Stereotactic-guided biopsy of right breast calcifications. No apparent complications.  Electronically Signed: By: Skipper Cliche M.D. On: 03/28/2014 09:45      IMPRESSION/PLAN: She has been discussed at our multidisciplinary tumor board.  The consensus is that she may be a good candidate for breast conservation. But, her imaging is complex and she has required further biopsies first to address suspicious areas as described above. Final path is pending. I talked to her about the option  of a mastectomy but she is enthusiastic about breast conservation if possible.  It was a pleasure meeting the patient today. We discussed the risks, benefits, and side effects of radiotherapy in the setting of breast conservation.  If no in situ or invasive disease is found in the left breast, RT will not be needed on that side. But, RT would be indicated adjuvantly for DCIS on the right side. We discussed that radiation would take approximately  6 weeks to complete and that I would give the patient a few weeks to heal following surgery before starting treatment planning. We spoke about acute effects including skin irritation and fatigue as well as much less common late  effects including lung and heart irritation. We spoke about the latest technology that is used to minimize the risk of late effects for breast cancer patients undergoing radiotherapy. No guarantees of treatment were given. The patient is enthusiastic about proceeding with treatment. I look forward to participating in the patient's care.  Of note, she may need post operative mammogram(s) to rule out residual calcifications after lumpectomies. This can be determined at her next f/u with me.  She may eventually need a med/onc referral as well.  She showed me a thigh mass today. She says it has grown. The overlying skin has telangiectasias that blanch.  I cannot recall at time of documentation which side this was on. I recommended she discuss with Dr. Lucia Gaskins so he or one of his partners who is familiar with assessing soft tissue masses may determine the appropriate work up, if any.  For her DCIS, I will not schedule any further f/u until I hear back from Dr. Lucia Gaskins. __________________________________________   Eppie Gibson, MD

## 2014-04-13 ENCOUNTER — Ambulatory Visit
Admission: RE | Admit: 2014-04-13 | Discharge: 2014-04-13 | Disposition: A | Discharge status: Home / Self Care | Payer: 59 | Source: Ambulatory Visit | Attending: Radiation Oncology | Admitting: Radiation Oncology

## 2014-04-13 ENCOUNTER — Encounter: Payer: Self-pay | Admitting: Radiation Oncology

## 2014-04-13 VITALS — BP 130/80 | HR 80 | Temp 98.6°F | Ht 64.5 in | Wt 186.1 lb

## 2014-04-13 DIAGNOSIS — C50412 Malignant neoplasm of upper-outer quadrant of left female breast: Secondary | ICD-10-CM

## 2014-04-13 DIAGNOSIS — Z170 Estrogen receptor positive status [ER+]: Secondary | ICD-10-CM | POA: Insufficient documentation

## 2014-04-13 DIAGNOSIS — D0590 Unspecified type of carcinoma in situ of unspecified breast: Secondary | ICD-10-CM | POA: Insufficient documentation

## 2014-04-13 DIAGNOSIS — Z9089 Acquired absence of other organs: Secondary | ICD-10-CM | POA: Diagnosis not present

## 2014-04-13 DIAGNOSIS — N6089 Other benign mammary dysplasias of unspecified breast: Secondary | ICD-10-CM | POA: Diagnosis not present

## 2014-04-13 DIAGNOSIS — F17200 Nicotine dependence, unspecified, uncomplicated: Secondary | ICD-10-CM | POA: Diagnosis not present

## 2014-04-13 DIAGNOSIS — C50411 Malignant neoplasm of upper-outer quadrant of right female breast: Secondary | ICD-10-CM | POA: Insufficient documentation

## 2014-04-13 DIAGNOSIS — Z510 Encounter for antineoplastic radiation therapy: Secondary | ICD-10-CM | POA: Insufficient documentation

## 2014-04-13 HISTORY — DX: Malignant neoplasm of unspecified site of unspecified female breast: C50.919

## 2014-04-13 NOTE — Addendum Note (Signed)
Encounter addended by: Deirdre Evener, RN on: 04/13/2014  6:41 PM<BR>     Documentation filed: Arn Medal VN

## 2014-04-13 NOTE — Progress Notes (Signed)
Left breast dressing steri-strip and band-aid saturated with bright red blood.Area cleaned with sterile saline and re-bandaged with band-aids and covered with telfa pad.Gave patient supplies in event area bleeds again.No visible bruising or swelling and not able to even see biopsy site.

## 2014-04-14 ENCOUNTER — Encounter (INDEPENDENT_AMBULATORY_CARE_PROVIDER_SITE_OTHER): Payer: Self-pay

## 2014-04-14 ENCOUNTER — Ambulatory Visit
Admission: RE | Admit: 2014-04-14 | Discharge: 2014-04-14 | Disposition: A | Discharge status: Home / Self Care | Payer: 59 | Source: Ambulatory Visit | Attending: Surgery | Admitting: Surgery

## 2014-04-14 DIAGNOSIS — R928 Other abnormal and inconclusive findings on diagnostic imaging of breast: Secondary | ICD-10-CM

## 2014-04-14 MED ORDER — GADOBENATE DIMEGLUMINE 529 MG/ML IV SOLN
17.0000 mL | Freq: Once | INTRAVENOUS | Status: AC | PRN
Start: 2014-04-14 — End: 2014-04-14
  Administered 2014-04-14: 17 mL via INTRAVENOUS

## 2014-04-18 ENCOUNTER — Other Ambulatory Visit: Payer: Self-pay

## 2014-04-18 ENCOUNTER — Other Ambulatory Visit (INDEPENDENT_AMBULATORY_CARE_PROVIDER_SITE_OTHER): Payer: Self-pay | Admitting: Surgery

## 2014-04-18 DIAGNOSIS — D0591 Unspecified type of carcinoma in situ of right breast: Secondary | ICD-10-CM

## 2014-04-18 NOTE — Addendum Note (Signed)
Addended by: Shann Medal on: 04/18/2014 09:50 AM   Modules accepted: Orders

## 2014-04-25 ENCOUNTER — Encounter (HOSPITAL_COMMUNITY): Payer: Self-pay | Admitting: Pharmacy Technician

## 2014-04-26 ENCOUNTER — Other Ambulatory Visit (INDEPENDENT_AMBULATORY_CARE_PROVIDER_SITE_OTHER): Payer: 59

## 2014-04-26 ENCOUNTER — Ambulatory Visit (INDEPENDENT_AMBULATORY_CARE_PROVIDER_SITE_OTHER): Payer: 59 | Admitting: Internal Medicine

## 2014-04-26 ENCOUNTER — Encounter (HOSPITAL_COMMUNITY): Payer: Self-pay

## 2014-04-26 ENCOUNTER — Encounter (HOSPITAL_COMMUNITY)
Admission: RE | Admit: 2014-04-26 | Discharge: 2014-04-26 | Disposition: A | Discharge status: Home / Self Care | Payer: 59 | Source: Ambulatory Visit | Attending: Surgery | Admitting: Surgery

## 2014-04-26 ENCOUNTER — Encounter: Payer: Self-pay | Admitting: Internal Medicine

## 2014-04-26 ENCOUNTER — Encounter (HOSPITAL_COMMUNITY)
Admission: RE | Admit: 2014-04-26 | Discharge: 2014-04-26 | Disposition: A | Discharge status: Home / Self Care | Payer: 59 | Source: Ambulatory Visit | Attending: Anesthesiology | Admitting: Anesthesiology

## 2014-04-26 VITALS — BP 130/78 | HR 78 | Temp 98.6°F | Ht 64.0 in | Wt 190.8 lb

## 2014-04-26 DIAGNOSIS — F329 Major depressive disorder, single episode, unspecified: Secondary | ICD-10-CM

## 2014-04-26 DIAGNOSIS — M545 Low back pain, unspecified: Secondary | ICD-10-CM

## 2014-04-26 DIAGNOSIS — D0590 Unspecified type of carcinoma in situ of unspecified breast: Secondary | ICD-10-CM

## 2014-04-26 DIAGNOSIS — D0591 Unspecified type of carcinoma in situ of right breast: Secondary | ICD-10-CM

## 2014-04-26 DIAGNOSIS — G43909 Migraine, unspecified, not intractable, without status migrainosus: Secondary | ICD-10-CM

## 2014-04-26 DIAGNOSIS — Z01812 Encounter for preprocedural laboratory examination: Secondary | ICD-10-CM | POA: Insufficient documentation

## 2014-04-26 DIAGNOSIS — Z01818 Encounter for other preprocedural examination: Secondary | ICD-10-CM | POA: Insufficient documentation

## 2014-04-26 DIAGNOSIS — R21 Rash and other nonspecific skin eruption: Secondary | ICD-10-CM

## 2014-04-26 DIAGNOSIS — F3289 Other specified depressive episodes: Secondary | ICD-10-CM

## 2014-04-26 DIAGNOSIS — Z8719 Personal history of other diseases of the digestive system: Secondary | ICD-10-CM

## 2014-04-26 HISTORY — DX: Personal history of other diseases of the digestive system: Z87.19

## 2014-04-26 HISTORY — DX: Pneumonia, unspecified organism: J18.9

## 2014-04-26 HISTORY — DX: Other complications of anesthesia, initial encounter: T88.59XA

## 2014-04-26 HISTORY — DX: Cardiac arrhythmia, unspecified: I49.9

## 2014-04-26 HISTORY — DX: Nonrheumatic mitral (valve) prolapse: I34.1

## 2014-04-26 HISTORY — DX: Depression, unspecified: F32.A

## 2014-04-26 HISTORY — DX: Anxiety disorder, unspecified: F41.9

## 2014-04-26 HISTORY — DX: Major depressive disorder, single episode, unspecified: F32.9

## 2014-04-26 HISTORY — DX: Adverse effect of unspecified anesthetic, initial encounter: T41.45XA

## 2014-04-26 LAB — HEPATIC FUNCTION PANEL
ALT: 15 U/L (ref 0–35)
AST: 19 U/L (ref 0–37)
Albumin: 3.9 g/dL (ref 3.5–5.2)
Alkaline Phosphatase: 71 U/L (ref 39–117)
BILIRUBIN DIRECT: 0.1 mg/dL (ref 0.0–0.3)
TOTAL PROTEIN: 6.6 g/dL (ref 6.0–8.3)
Total Bilirubin: 0.5 mg/dL (ref 0.2–1.2)

## 2014-04-26 LAB — CBC
HEMATOCRIT: 41.3 % (ref 36.0–46.0)
HEMOGLOBIN: 13.4 g/dL (ref 12.0–15.0)
MCH: 30.6 pg (ref 26.0–34.0)
MCHC: 32.4 g/dL (ref 30.0–36.0)
MCV: 94.3 fL (ref 78.0–100.0)
Platelets: 219 10*3/uL (ref 150–400)
RBC: 4.38 MIL/uL (ref 3.87–5.11)
RDW: 13.8 % (ref 11.5–15.5)
WBC: 7.4 10*3/uL (ref 4.0–10.5)

## 2014-04-26 LAB — URINALYSIS
Bilirubin Urine: NEGATIVE
HGB URINE DIPSTICK: NEGATIVE
Ketones, ur: NEGATIVE
LEUKOCYTES UA: NEGATIVE
Nitrite: NEGATIVE
Total Protein, Urine: NEGATIVE
Urine Glucose: NEGATIVE
Urobilinogen, UA: 0.2 (ref 0.0–1.0)
pH: 7 (ref 5.0–8.0)

## 2014-04-26 LAB — HCG, SERUM, QUALITATIVE: PREG SERUM: NEGATIVE

## 2014-04-26 LAB — BASIC METABOLIC PANEL
ANION GAP: 10 (ref 5–15)
BUN: 11 mg/dL (ref 6–23)
CHLORIDE: 104 meq/L (ref 96–112)
CO2: 28 meq/L (ref 19–32)
Calcium: 8.7 mg/dL (ref 8.4–10.5)
Creatinine, Ser: 0.59 mg/dL (ref 0.50–1.10)
GFR calc Af Amer: >90 mL/min (ref >90)
GFR calc non Af Amer: >90 mL/min (ref >90)
Glucose, Bld: 96 mg/dL (ref 70–99)
Potassium: 4.6 mEq/L (ref 3.7–5.3)
SODIUM: 142 meq/L (ref 137–147)

## 2014-04-26 MED ORDER — VITAMIN D 1000 UNITS PO TABS
1000.0000 [IU] | ORAL_TABLET | Freq: Every day | ORAL | Status: DC
Start: 2014-04-26 — End: 2014-05-26

## 2014-04-26 MED ORDER — TRAMADOL HCL 50 MG PO TABS: 50.0000 mg | ORAL_TABLET | Freq: Two times a day (BID) | ORAL | Status: DC | PRN

## 2014-04-26 NOTE — Assessment & Plan Note (Addendum)
  Imitrex intolerant Tramadol, Advil prn - rare

## 2014-04-26 NOTE — Assessment & Plan Note (Signed)
Continue with current prescription therapy as reflected on the Med list.  

## 2014-04-26 NOTE — Assessment & Plan Note (Signed)
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 OK to use gluten-free bread and gluten-free pasta.

## 2014-04-26 NOTE — Assessment & Plan Note (Addendum)
Surgery+XRT Discussed Vit D Labs

## 2014-04-26 NOTE — Progress Notes (Signed)
Pre visit review using our clinic review tool, if applicable. No additional management support is needed unless otherwise documented below in the visit note. 

## 2014-04-26 NOTE — Patient Instructions (Signed)
Gluten free trial (no wheat products) for 4-6 weeks. OK to use gluten-free bread and gluten-free pasta.     

## 2014-04-26 NOTE — Assessment & Plan Note (Signed)
IBS-c Gluten free trial (no wheat products) for 4-6 weeks. OK to use gluten-free bread and gluten-free pasta.

## 2014-04-26 NOTE — Assessment & Plan Note (Addendum)
MSK 2015 Stretch Yoga Tramadol low dose prn Vit D  Potential benefits of a long term opioids use as well as potential risks (i.e. addiction risk, apnea etc) and complications (i.e. Somnolence, constipation and others) were explained to the patient and were aknowledged.

## 2014-04-26 NOTE — Pre-Procedure Instructions (Addendum)
Tamara Cross  04/26/2014   Your procedure is scheduled on:  05/02/14  Report to [facility] cone short stay admitting at come to hosp when finish at breast center.  Call this number if you have problems the morning of surgery: [phone]   Remember:   Do not eat food or drink liquids after midnight.   Take these medicines the morning of surgery with A SIP OF WATER: clonazepam, pristiq,lamictal,methocarbamol,metoprolol, valtrex if needed    Take all meds as ordered until day of surgery except as instructed below or per dr    Bridgette Habermann all herbel meds, nsaids (aleve,naproxen,advil,ibuprofen) 5 days prior to surgery   Do not wear jewelry, make-up or nail polish.  Do not wear lotions, powders, or perfumes. You may wear deodorant.  Do not shave 48 hours prior to surgery. Men may shave face and neck.  Do not bring valuables to the hospital.  [facility] is not responsible                  for any belongings or valuables.               Contacts, dentures or bridgework may not be worn into surgery.  Leave suitcase in the car. After surgery it may be brought to your room.  For patients admitted to the hospital, discharge time is determined by your                treatment team.               Patients discharged the day of surgery will not be allowed to drive  home.  Name and phone number of your driver:   Special Instructions:  Special Instructions: [place] - Preparing for Surgery  Before surgery, you can play an important role.  Because skin is not sterile, your skin needs to be as free of germs as possible.  You can reduce the number of germs on you skin by washing with CHG (chlorahexidine gluconate) soap before surgery.  CHG is an antiseptic cleaner which kills germs and bonds with the skin to continue killing germs even after washing.  Please DO NOT use if you have an allergy to CHG or antibacterial soaps.  If your skin becomes reddened/irritated stop using the CHG and inform your nurse when  you arrive at Short Stay.  Do not shave (including legs and underarms) for at least 48 hours prior to the first CHG shower.  You may shave your face.  Please follow these instructions carefully:   1.  Shower with CHG Soap the night before surgery and the morning of Surgery.  2.  If you choose to wash your hair, wash your hair first as usual with your normal shampoo.  3.  After you shampoo, rinse your hair and body thoroughly to remove the Shampoo.  4.  Use CHG as you would any other liquid soap.  You can apply chg directly  to the skin and wash gently with scrungie or a clean washcloth.  5.  Apply the CHG Soap to your body ONLY FROM THE NECK DOWN.  Do not use on open wounds or open sores.  Avoid contact with your eyes ears, mouth and genitals (private parts).  Wash genitals (private parts)       with your normal soap.  6.  Wash thoroughly, paying special attention to the area where your surgery will be performed.  7.  Thoroughly rinse your body with warm water from the  neck down.  8.  DO NOT shower/wash with your normal soap after using and rinsing off the CHG Soap.  9.  Pat yourself dry with a clean towel.            10.  Wear clean pajamas.            11.  Place clean sheets on your bed the night of your first shower and do not sleep with pets.  Day of Surgery  Do not apply any lotions/deodorants the morning of surgery.  Please wear clean clothes to the hospital/surgery center.   Please read over the following fact sheets that you were given: Pain Booklet, Coughing and Deep Breathing and Surgical Site Infection Prevention

## 2014-04-26 NOTE — Progress Notes (Signed)
Subjective:     HPI  Former Dr Linda Hedges pt   The patient is here to follow up on chronic anxiety, headaches and chronic IBS-c symptoms, recent dx of breast cancer (R). C/o LBP, cervical pain  Wt Readings from Last 3 Encounters:  04/26/14 190 lb 12.8 oz (86.546 kg)  04/26/14 190 lb 8 oz (86.41 kg)  04/13/14 186 lb 1.6 oz (84.414 kg)   BP Readings from Last 3 Encounters:  04/26/14 130/78  04/26/14 138/84  04/13/14 130/80      Review of Systems  Constitutional: Negative for chills, activity change, appetite change, fatigue and unexpected weight change.  HENT: Negative for congestion, ear pain, mouth sores, sinus pressure, sore throat and tinnitus.   Eyes: Negative for visual disturbance.  Respiratory: Negative for cough and chest tightness.   Cardiovascular: Negative for leg swelling.  Gastrointestinal: Positive for constipation. Negative for nausea, abdominal pain and diarrhea.  Genitourinary: Negative for urgency, frequency, flank pain, vaginal discharge, difficulty urinating and vaginal pain.  Musculoskeletal: Positive for back pain and neck pain. Negative for gait problem.  Skin: Negative for pallor, rash and wound.  Neurological: Positive for headaches. Negative for dizziness, tremors, syncope, weakness and numbness.  Hematological: Negative for adenopathy. Does not bruise/bleed easily.  Psychiatric/Behavioral: Negative for suicidal ideas, confusion, sleep disturbance, dysphoric mood and agitation. The patient is nervous/anxious.     Wt Readings from Last 3 Encounters:  04/26/14 190 lb 12.8 oz (86.546 kg)  04/26/14 190 lb 8 oz (86.41 kg)  04/13/14 186 lb 1.6 oz (84.414 kg)   BP Readings from Last 3 Encounters:  04/26/14 130/78  04/26/14 138/84  04/13/14 130/80        Objective:   Physical Exam  Constitutional: She appears well-developed. No distress.  Obese  HENT:  Head: Normocephalic.  Right Ear: External ear normal.  Left Ear: External ear normal.    Nose: Nose normal.  Mouth/Throat: Oropharynx is clear and moist.  Eyes: Conjunctivae are normal. Pupils are equal, round, and reactive to light. Right eye exhibits no discharge. Left eye exhibits no discharge.  Neck: Normal range of motion. Neck supple. No JVD present. No tracheal deviation present. No thyromegaly present.  Cardiovascular: Normal rate, regular rhythm and normal heart sounds.   Pulmonary/Chest: No stridor. No respiratory distress. She has no wheezes.  Abdominal: Soft. Bowel sounds are normal. She exhibits no distension and no mass. There is no tenderness. There is no rebound and no guarding.  Musculoskeletal: She exhibits tenderness. She exhibits no edema.  LS is sensitive w/ROM  Lymphadenopathy:    She has no cervical adenopathy.  Neurological: She displays normal reflexes. No cranial nerve deficit. She exhibits normal muscle tone. Coordination normal.  Skin: No rash noted. No erythema.  Psychiatric: She has a normal mood and affect. Her behavior is normal. Judgment and thought content normal.    Lab Results  Component Value Date   WBC 7.4 04/26/2014   HGB 13.4 04/26/2014   HCT 41.3 04/26/2014   PLT 219 04/26/2014   GLUCOSE 96 04/26/2014   CHOL 178 08/23/2008   TRIG 47 08/23/2008   HDL 52.7 08/23/2008   LDLCALC 116* 08/23/2008   ALT 22 06/22/2012   AST 25 06/22/2012   NA 142 04/26/2014   K 4.6 04/26/2014   CL 104 04/26/2014   CREATININE 0.59 04/26/2014   BUN 11 04/26/2014   CO2 28 04/26/2014   TSH 1.28 08/23/2008         Assessment & Plan:

## 2014-04-27 ENCOUNTER — Encounter (HOSPITAL_COMMUNITY): Payer: Self-pay

## 2014-04-27 LAB — TSH: TSH: 2.57 u[IU]/mL (ref 0.35–4.50)

## 2014-04-27 LAB — VITAMIN B12: VITAMIN B 12: 587 pg/mL (ref 211–911)

## 2014-04-27 LAB — VITAMIN D 25 HYDROXY (VIT D DEFICIENCY, FRACTURES): VITD: 34.73 ng/mL

## 2014-04-27 NOTE — Progress Notes (Signed)
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 Alphonsa Overall.  History includes smoking, breast cancer, MVP, tachy palpitations, anxiety, depression, GERD, hiatal hernia, migraine headaches, arthritis, appendectomy, tonsillectomy. She reported tachycardia with her last two surgeries. BMI is consistent with obesity. PCP Is Dr. Alain Marion, last visit 04/26/14.  Cardiologist is Dr. Caryl Comes, last visit 11/18/13 with one year follow-up recommended. He felt her description was consistent with sinus tachycardia. She is on b-blocker therapy which she was instructed to take on the morning of surgery.  EKG on 05/02/14 showed NSR.  Preoperative CXR and labs noted.   If no acute changes then I would anticipate that she could proceed as planned.  George Hugh [facility] Stay [facility]/Anesthesiology Phone [phone] 04/27/2014 10:19 AM

## 2014-05-01 MED ORDER — CEFAZOLIN SODIUM-DEXTROSE 2-3 GM-% IV SOLR
2.0000 g | INTRAVENOUS | Status: AC
  Administered 2014-05-02: 2 g via INTRAVENOUS
  Filled 2014-05-01: qty 50

## 2014-05-02 ENCOUNTER — Ambulatory Visit
Admission: RE | Admit: 2014-05-02 | Discharge: 2014-05-02 | Disposition: A | Discharge status: Home / Self Care | Payer: 59 | Source: Ambulatory Visit | Attending: Surgery | Admitting: Surgery

## 2014-05-02 ENCOUNTER — Ambulatory Visit (HOSPITAL_COMMUNITY)
Admission: RE | Admit: 2014-05-02 | Discharge: 2014-05-02 | Disposition: A | Discharge status: Home / Self Care | Payer: 59 | Source: Ambulatory Visit | Attending: Surgery | Admitting: Surgery

## 2014-05-02 ENCOUNTER — Other Ambulatory Visit (INDEPENDENT_AMBULATORY_CARE_PROVIDER_SITE_OTHER): Payer: Self-pay | Admitting: Surgery

## 2014-05-02 ENCOUNTER — Ambulatory Visit (HOSPITAL_COMMUNITY): Payer: 59 | Admitting: Anesthesiology

## 2014-05-02 ENCOUNTER — Encounter (HOSPITAL_COMMUNITY): Payer: Self-pay | Admitting: *Deleted

## 2014-05-02 ENCOUNTER — Encounter (HOSPITAL_COMMUNITY): Payer: 59 | Admitting: Vascular Surgery

## 2014-05-02 ENCOUNTER — Encounter (HOSPITAL_COMMUNITY)
Admission: RE | Disposition: A | Discharge status: Home / Self Care | Payer: Self-pay | Source: Ambulatory Visit | Attending: Surgery

## 2014-05-02 DIAGNOSIS — D4860 Neoplasm of uncertain behavior of unspecified breast: Secondary | ICD-10-CM

## 2014-05-02 DIAGNOSIS — Z79899 Other long term (current) drug therapy: Secondary | ICD-10-CM | POA: Diagnosis not present

## 2014-05-02 DIAGNOSIS — D0591 Unspecified type of carcinoma in situ of right breast: Secondary | ICD-10-CM

## 2014-05-02 DIAGNOSIS — J309 Allergic rhinitis, unspecified: Secondary | ICD-10-CM | POA: Insufficient documentation

## 2014-05-02 DIAGNOSIS — D0502 Lobular carcinoma in situ of left breast: Secondary | ICD-10-CM

## 2014-05-02 DIAGNOSIS — K449 Diaphragmatic hernia without obstruction or gangrene: Secondary | ICD-10-CM | POA: Insufficient documentation

## 2014-05-02 DIAGNOSIS — F17200 Nicotine dependence, unspecified, uncomplicated: Secondary | ICD-10-CM | POA: Diagnosis not present

## 2014-05-02 DIAGNOSIS — F329 Major depressive disorder, single episode, unspecified: Secondary | ICD-10-CM | POA: Insufficient documentation

## 2014-05-02 DIAGNOSIS — K219 Gastro-esophageal reflux disease without esophagitis: Secondary | ICD-10-CM | POA: Diagnosis not present

## 2014-05-02 DIAGNOSIS — R920 Mammographic microcalcification found on diagnostic imaging of breast: Secondary | ICD-10-CM

## 2014-05-02 DIAGNOSIS — I059 Rheumatic mitral valve disease, unspecified: Secondary | ICD-10-CM | POA: Insufficient documentation

## 2014-05-02 DIAGNOSIS — F3289 Other specified depressive episodes: Secondary | ICD-10-CM | POA: Insufficient documentation

## 2014-05-02 DIAGNOSIS — N6019 Diffuse cystic mastopathy of unspecified breast: Secondary | ICD-10-CM

## 2014-05-02 DIAGNOSIS — F411 Generalized anxiety disorder: Secondary | ICD-10-CM | POA: Diagnosis not present

## 2014-05-02 DIAGNOSIS — D0590 Unspecified type of carcinoma in situ of unspecified breast: Secondary | ICD-10-CM | POA: Diagnosis present

## 2014-05-02 DIAGNOSIS — C50411 Malignant neoplasm of upper-outer quadrant of right female breast: Secondary | ICD-10-CM

## 2014-05-02 HISTORY — PX: BREAST LUMPECTOMY WITH NEEDLE LOCALIZATION: SHX5759

## 2014-05-02 SURGERY — BREAST LUMPECTOMY WITH NEEDLE LOCALIZATION
Anesthesia: General | Site: Chest | Laterality: Bilateral

## 2014-05-02 MED ORDER — CHLORHEXIDINE GLUCONATE 4 % EX LIQD
1.0000 "application " | Freq: Once | CUTANEOUS | Status: DC
  Filled 2014-05-02: qty 15

## 2014-05-02 MED ORDER — FENTANYL CITRATE 0.05 MG/ML IJ SOLN
INTRAMUSCULAR | Status: DC | PRN
  Administered 2014-05-02 (×5): 50 ug via INTRAVENOUS

## 2014-05-02 MED ORDER — ONDANSETRON HCL 4 MG/2ML IJ SOLN
INTRAMUSCULAR | Status: DC | PRN
  Administered 2014-05-02: 4 mg via INTRAVENOUS

## 2014-05-02 MED ORDER — FENTANYL CITRATE 0.05 MG/ML IJ SOLN
INTRAMUSCULAR | Status: AC
  Filled 2014-05-02: qty 5

## 2014-05-02 MED ORDER — PROMETHAZINE HCL 25 MG/ML IJ SOLN
25.0000 mg | Freq: Once | INTRAMUSCULAR | Status: AC
  Administered 2014-05-02: 6.25 mg via INTRAVENOUS

## 2014-05-02 MED ORDER — FAMOTIDINE 20 MG PO TABS
20.0000 mg | ORAL_TABLET | Freq: Once | ORAL | Status: AC
  Administered 2014-05-02: 20 mg via ORAL
  Filled 2014-05-02: qty 1

## 2014-05-02 MED ORDER — LACTATED RINGERS IV SOLN
INTRAVENOUS | Status: DC
  Administered 2014-05-02 (×2): via INTRAVENOUS

## 2014-05-02 MED ORDER — PROMETHAZINE HCL 25 MG/ML IJ SOLN
INTRAMUSCULAR | Status: AC
  Filled 2014-05-02: qty 1

## 2014-05-02 MED ORDER — 0.9 % SODIUM CHLORIDE (POUR BTL) OPTIME
TOPICAL | Status: DC | PRN
  Administered 2014-05-02: 1000 mL

## 2014-05-02 MED ORDER — HYDROMORPHONE HCL PF 1 MG/ML IJ SOLN
0.2500 mg | INTRAMUSCULAR | Status: DC | PRN
  Administered 2014-05-02 (×2): 0.5 mg via INTRAVENOUS

## 2014-05-02 MED ORDER — LIDOCAINE HCL (CARDIAC) 10 MG/ML IV SOLN
INTRAVENOUS | Status: DC | PRN
  Administered 2014-05-02: 60 mg via INTRAVENOUS

## 2014-05-02 MED ORDER — PROPOFOL 10 MG/ML IV BOLUS
INTRAVENOUS | Status: DC | PRN
  Administered 2014-05-02: 200 mg via INTRAVENOUS

## 2014-05-02 MED ORDER — MIDAZOLAM HCL 5 MG/5ML IJ SOLN
INTRAMUSCULAR | Status: DC | PRN
  Administered 2014-05-02: 2 mg via INTRAVENOUS

## 2014-05-02 MED ORDER — LIDOCAINE HCL (CARDIAC) 20 MG/ML IV SOLN
INTRAVENOUS | Status: AC
  Filled 2014-05-02: qty 5

## 2014-05-02 MED ORDER — ONDANSETRON HCL 4 MG/2ML IJ SOLN: 4.0000 mg | Freq: Once | INTRAMUSCULAR | Status: DC | PRN

## 2014-05-02 MED ORDER — HYDROCODONE-ACETAMINOPHEN 5-325 MG PO TABS: 1.0000 | ORAL_TABLET | Freq: Four times a day (QID) | ORAL | Status: DC | PRN

## 2014-05-02 MED ORDER — HYDROMORPHONE HCL PF 1 MG/ML IJ SOLN
INTRAMUSCULAR | Status: AC
  Filled 2014-05-02: qty 1

## 2014-05-02 MED ORDER — BUPIVACAINE HCL (PF) 0.25 % IJ SOLN
INTRAMUSCULAR | Status: AC
  Filled 2014-05-02: qty 30

## 2014-05-02 MED ORDER — BUPIVACAINE HCL 0.25 % IJ SOLN
INTRAMUSCULAR | Status: DC | PRN
  Administered 2014-05-02: 30 mL

## 2014-05-02 MED ORDER — DEXAMETHASONE SODIUM PHOSPHATE 4 MG/ML IJ SOLN
INTRAMUSCULAR | Status: DC | PRN
  Administered 2014-05-02: 8 mg via INTRAVENOUS

## 2014-05-02 MED ORDER — MIDAZOLAM HCL 2 MG/2ML IJ SOLN
INTRAMUSCULAR | Status: AC
  Filled 2014-05-02: qty 2

## 2014-05-02 MED ORDER — ONDANSETRON HCL 4 MG/2ML IJ SOLN
INTRAMUSCULAR | Status: AC
  Filled 2014-05-02: qty 2

## 2014-05-02 SURGICAL SUPPLY — 62 items
ADH SKN CLS APL DERMABOND .7 (GAUZE/BANDAGES/DRESSINGS) ×2
APL SKNCLS STERI-STRIP NONHPOA (GAUZE/BANDAGES/DRESSINGS)
BENZOIN TINCTURE PRP APPL 2/3 (GAUZE/BANDAGES/DRESSINGS) IMPLANT
BINDER BREAST LRG (GAUZE/BANDAGES/DRESSINGS) IMPLANT
BINDER BREAST XLRG (GAUZE/BANDAGES/DRESSINGS) ×3 IMPLANT
BLADE SURG 10 STRL SS (BLADE) ×6 IMPLANT
CANISTER SUCTION 2500CC (MISCELLANEOUS) ×3 IMPLANT
CHLORAPREP W/TINT 26ML (MISCELLANEOUS) ×3 IMPLANT
CLIP TI WIDE RED SMALL 6 (CLIP) ×3 IMPLANT
CLOSURE WOUND 1/4X4 (GAUZE/BANDAGES/DRESSINGS)
COVER SURGICAL LIGHT HANDLE (MISCELLANEOUS) ×3 IMPLANT
DECANTER SPIKE VIAL GLASS SM (MISCELLANEOUS) IMPLANT
DERMABOND ADVANCED (GAUZE/BANDAGES/DRESSINGS) ×4
DERMABOND ADVANCED .7 DNX12 (GAUZE/BANDAGES/DRESSINGS) ×2 IMPLANT
DEVICE DUBIN SPECIMEN MAMMOGRA (MISCELLANEOUS) ×3 IMPLANT
DRAPE CHEST BREAST 15X10 FENES (DRAPES) ×3 IMPLANT
DRAPE UTILITY 15X26 W/TAPE STR (DRAPE) ×6 IMPLANT
DRSG PAD ABDOMINAL 8X10 ST (GAUZE/BANDAGES/DRESSINGS) ×6 IMPLANT
ELECT COATED BLADE 2.86 ST (ELECTRODE) ×3 IMPLANT
ELECT REM PT RETURN 9FT ADLT (ELECTROSURGICAL) ×3
ELECTRODE REM PT RTRN 9FT ADLT (ELECTROSURGICAL) ×1 IMPLANT
GLOVE BIO SURGEON STRL SZ7.5 (GLOVE) ×6 IMPLANT
GLOVE BIOGEL PI IND STRL 7.0 (GLOVE) ×1 IMPLANT
GLOVE BIOGEL PI IND STRL 7.5 (GLOVE) ×1 IMPLANT
GLOVE BIOGEL PI IND STRL 8 (GLOVE) ×1 IMPLANT
GLOVE BIOGEL PI INDICATOR 7.0 (GLOVE) ×2
GLOVE BIOGEL PI INDICATOR 7.5 (GLOVE) ×2
GLOVE BIOGEL PI INDICATOR 8 (GLOVE) ×2
GLOVE SURG SIGNA 7.5 PF LTX (GLOVE) ×9 IMPLANT
GLOVE SURG SS PI 6.5 STRL IVOR (GLOVE) ×3 IMPLANT
GLOVE SURG SS PI 7.0 STRL IVOR (GLOVE) ×3 IMPLANT
GLOVE SURG SS PI 7.5 STRL IVOR (GLOVE) ×3 IMPLANT
GOWN STRL REUS W/ TWL LRG LVL3 (GOWN DISPOSABLE) ×2 IMPLANT
GOWN STRL REUS W/ TWL XL LVL3 (GOWN DISPOSABLE) ×1 IMPLANT
GOWN STRL REUS W/TWL LRG LVL3 (GOWN DISPOSABLE) ×6
GOWN STRL REUS W/TWL XL LVL3 (GOWN DISPOSABLE) ×3
KIT BASIN OR (CUSTOM PROCEDURE TRAY) ×3 IMPLANT
KIT MARKER MARGIN INK (KITS) ×6 IMPLANT
KIT ROOM TURNOVER OR (KITS) ×3 IMPLANT
MARKER SKIN DUAL TIP RULER LAB (MISCELLANEOUS) ×3 IMPLANT
NEEDLE HYPO 25GX1X1/2 BEV (NEEDLE) ×3 IMPLANT
NS IRRIG 1000ML POUR BTL (IV SOLUTION) ×3 IMPLANT
PACK SURGICAL SETUP 50X90 (CUSTOM PROCEDURE TRAY) ×3 IMPLANT
PAD ARMBOARD 7.5X6 YLW CONV (MISCELLANEOUS) ×3 IMPLANT
PENCIL BUTTON HOLSTER BLD 10FT (ELECTRODE) ×3 IMPLANT
SPONGE GAUZE 4X4 12PLY STER LF (GAUZE/BANDAGES/DRESSINGS) ×6 IMPLANT
SPONGE LAP 18X18 X RAY DECT (DISPOSABLE) ×3 IMPLANT
SPONGE LAP 4X18 X RAY DECT (DISPOSABLE) ×3 IMPLANT
STAPLER VISISTAT 35W (STAPLE) ×3 IMPLANT
STRIP CLOSURE SKIN 1/4X4 (GAUZE/BANDAGES/DRESSINGS) IMPLANT
SUT MON AB 5-0 PS2 18 (SUTURE) ×6 IMPLANT
SUT VIC AB 3-0 SH 18 (SUTURE) ×3 IMPLANT
SUT VIC AB 3-0 SH 8-18 (SUTURE) ×3 IMPLANT
SYR BULB 3OZ (MISCELLANEOUS) ×3 IMPLANT
SYR CONTROL 10ML LL (SYRINGE) ×3 IMPLANT
TAPE CLOTH SOFT 2X10 (GAUZE/BANDAGES/DRESSINGS) ×6 IMPLANT
TAPE CLOTH SURG 4X10 WHT LF (GAUZE/BANDAGES/DRESSINGS) ×3 IMPLANT
TOWEL OR 17X24 6PK STRL BLUE (TOWEL DISPOSABLE) ×3 IMPLANT
TOWEL OR 17X26 10 PK STRL BLUE (TOWEL DISPOSABLE) ×3 IMPLANT
TUBE CONNECTING 12'X1/4 (SUCTIONS) ×2
TUBE CONNECTING 12X1/4 (SUCTIONS) ×4 IMPLANT
YANKAUER SUCT BULB TIP NO VENT (SUCTIONS) ×3 IMPLANT

## 2014-05-02 NOTE — Anesthesia Postprocedure Evaluation (Signed)
  Anesthesia Post-op Note  Patient: Tamara Cross  Procedure(s) Performed: Procedure(s): BILATERIAL BREAST LUMPECTOMY WITH NEEDLE LOCALIZATION (Bilateral)  Patient Location: PACU  Anesthesia Type:General  Level of Consciousness: awake, alert , oriented and patient cooperative  Airway and Oxygen Therapy: Patient Spontanous Breathing  Post-op Pain: mild  Post-op Assessment: Post-op Vital signs reviewed, Patient's Cardiovascular Status Stable, Respiratory Function Stable, Patent Airway, NAUSEA AND VOMITING PRESENT and Pain level controlled  Post-op Vital Signs: stable  Last Vitals:  Filed Vitals:   05/02/14 1812  BP:   Pulse: 101  Temp:   Resp: 16    Complications: No apparent anesthesia complications

## 2014-05-02 NOTE — H&P (Signed)
Re: Tamara Cross  DOB: [birth year]  MRN: [id number]   ASSESSMENT AND PLAN:  1. Right breast cancer, DCIS, upper outer quadrant [drawing at end of note]   Biopsy - 03/28/2014 ([id number]) - Right breast - DCIS, Left breast - ALH   I discussed the options for breast cancer treatment with the patient. I discussed a multidisciplinary approach to the treatment of breast cancer, which includes medical oncology and radiation oncology. I discussed the surgical options of lumpectomy vs. mastectomy. If mastectomy, there is the possibility of reconstruction. I discussed the options of lymph node biopsy.  The treatment plan depends on the pathologic staging of the tumor and the patient's personal wishes.   The risks of surgery include, but are not limited to, bleeding, infection, the need for further surgery, and nerve injury.   The patient has been given literature on the treatment of breast cancer.  Plan: 1) , 2) We will have her see the rad oncologist pre op, 3)  Then localization biopsy of both breast, 4) After the MRI, will discuss with radiology about the 4 mm calcs in the left breast which have not been biopsied.  [Presented at breast cancer conf. She will see Dr. Isidore Moos next week. Her MRI showed - 1) Second area on right breast 8 mm anterior to biopsied cancer, 2) new area left breast to biopsy, 3) Microca++ (that they were going to watch) will now be biopsied. 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 Surgical excision of the atypical lobular hyperplasia is recommended.  Left breast at 9 o'clock a fibroadenoma was reported histologically. DN 04/13/2014]  [Biopsy ([id number]) - 04/14/2014 - Right core biopsy - negative, microcalcs seen. I spoke to patient and husband by phone. Will review with [facility] and schedule surgery. DN 04/17/2014]  [I reviewed the images with S. Turner.  She will need two wire locs on right (UOQ) and two wire locs on left (12 and 2 o'clock). Discussed with patient. DN 04/18/2014]  Husband and mother in law at bedside.  She has 4 wires.  2. Left Breast, atypical lobular hyperplasia, upper outer quadrant   This will require wider excision.  3. Depression and anxiety  4. History of tachycardias with shoulder surgery.   Chief Complaint   Patient presents with   .  Breast Mass     new pt- eval rt breast MCIS and lt breast ALH   REFERRING PHYSICIAN: Adella Hare, MD   HISTORY OF PRESENT ILLNESS:  Tamara Cross is a 48 y.o. (DOB: 07/30/1966) white female whose primary care physician is Adella Hare, MD and comes to me today for right breast cancer.   She is accompanied by her husband.   They are going to the beach and are a little impatient with my tardiness.   She had a mass removed from her left breast in her 62's (probably fibroadenoma). She said more recently she had a small cyst of her left breast that has been watched. It never required a biopsy. She has not had significant breast disease. No fam hx of breast cancer, though she has a strong fam hx of colon ca. She is not on hormones. She had her last menstrual period around 2013.   Mammogram on 03/10/2014 - 1. Indeterminate calcifications in the upper-outer right breast mid  to posterior depth. 2. Indeterminate calcifications in the upper-outer left breast mid to posterior depth. 3. Probably  benign 4 mm group of calcifications in the superior left breast anterior depth.  Biopsy - 03/28/[id number]) - Right breast - DCIS, Left breast - [facility]  Past Medical History   Diagnosis  Date   .  Allergy      chroni rhinitis   .  GERD (gastroesophageal reflux disease)    .  Ulcer      previously had   .  Heart murmur      MVP   .  Migraine    .  Arthritis     Past Surgical History   Procedure  Laterality  Date   .  Cesarean section   2002   .  Appendectomy     .  Tonsillectomy     .   Cystectomy       left breast   .  Breast lumpectomy     .  Shoulder surgery      Current Outpatient Prescriptions   Medication  Sig  Dispense  Refill   .  clonazePAM (KLONOPIN) 0.5 MG tablet  Take 0.5 mg by mouth at bedtime as needed for anxiety.     [name]  desvenlafaxine (PRISTIQ) 100 MG 24 hr tablet  Take 100 mg by mouth daily.     [name]  lamoTRIgine (LAMICTAL) 100 MG tablet  Take 100 mg by mouth 2 (two) times daily.     .  metoprolol succinate (TOPROL-XL) 50 MG 24 hr tablet  Take 1 tablet (50 mg total) by mouth daily. Take with or immediately following a meal.  90 tablet  0   .  traZODone (DESYREL) 100 MG tablet  at bedtime.     .  valACYclovir (VALTREX) 500 MG tablet  as needed.      No current facility-administered medications for this visit.    Allergies   Allergen  Reactions   .  Sulfonamide Derivatives  Shortness Of Breath   .  Imitrex [Sumatriptan]      Throat swells   .  Reglan [Metoclopramide]      Possible cause of SVT.    REVIEW OF SYSTEMS:  Skin: No history of rash. No history of abnormal moles.  Infection: No history of hepatitis or HIV. No history of MRSA.  Neurologic: No history of stroke. No history of seizure. No history of headaches.  Cardiac: MVP. Sees Dr. Olin Pia. Has history of tachycardia in recovery room post shoulder surgery.  Pulmonary: Smoked in her 66 and 30's. Now smokes socially.  Endocrine: No diabetes. No thyroid disease.  Gastrointestinal: History of PUD and HH. Her PUD is resolved. Nexium controls symptoms from the [facility]. No history of liver disease. No history of gall bladder disease. No history of pancreas disease. History of appendectomy. Strong family history of colon ca - gets regular colonoscopies by Dr. Olevia Perches. Last colonoscopy about 2010.  Urologic: No history of kidney stones. No history of bladder infections.  GYN: Sees Dr. Jerilynn Mages. [place].  Musculoskeletal: Left shoulder surgery for impingement by Dr. French Ana - Jan and April, 2015  Hematologic: No  bleeding disorder. No history of anemia. Not anticoagulated.  Psycho-social: Depression and anxiety   SOCIAL and FAMILY HISTORY:  Married.  She works for [employer] with her.  Has on son who is about to turn 47.  Her mother in law, who was with her, had Stage 3 breast cancer about 6 years ago and is doing well.   PHYSICAL EXAM:  BP 126/86  Pulse 68  Temp(Src)  98 F (36.7 C) (Temporal)  Resp 14  Ht 5' 4.5" (1.638 m)  Wt 181 lb (82.101 kg)  BMI 30.60 kg/m2  General: WN WF who is alert and generally healthy appearing.  HEENT: Normal. Pupils equal.  Neck: Supple. No mass. No thyroid mass.  Lymph Nodes: No supraclavicular or cervical nodes.  Lungs: Clear to auscultation and symmetric breath sounds.  Heart: RRR. No murmur or rub.  Breast: Right - upper outer quadrant bruise   Left - Upper outer quadrant bruise   Abdomen: Soft. No mass. No tenderness. No hernia. Normal bowel sounds. No abdominal scars.  Rectal: Not done.  Extremities: Good strength and ROM in upper and lower extremities.  Neurologic: Grossly intact to motor and sensory function.  Psychiatric: Has normal mood and affect. Behavior is normal.    Drawing of findings (I can't get the image to rotate).   DATA REVIEWED:  Epic notes.  Alphonsa Overall, MD, [facility], [address] [place], [place]  Phone: [phone] FAX: [phone]

## 2014-05-02 NOTE — Discharge Instructions (Signed)
[facility] - DISCHARGE INSTRUCTIONS TO PATIENT  Activity:  Driving - 2 or 3 days, if doing well   Lifting - No lifting more than 15 pounds for 5 days, then no limit  Wound Care:   Leave dressings on breasts until Wednesday, then remove and shower.  Diet:  As tolerated  Follow up appointment:  Call Dr. Pollie Friar office [facility]) at [phone] for an appointment in 2 to 3 weeks.  Medications and dosages:  Resume your home medications.  You have a prescription for:  Vicodin  Call Dr. Lucia Gaskins or his office  [phone]) if you have:  Temperature greater than 100.4,  Persistent nausea and vomiting,  Severe uncontrolled pain,  Redness, tenderness, or signs of infection (pain, swelling, redness, odor or green/yellow discharge around the site),  Difficulty breathing, headache or visual disturbances,  Any other questions or concerns you may have after discharge.  In an emergency, call 911 or go to an Emergency Department at a nearby hospital.   What to eat:  For your first meals, you should eat lightly; only small meals initially.  If you do not have nausea, you may eat larger meals.  Avoid spicy, greasy and heavy food.    General Anesthesia, Adult, Care After  Refer to this sheet in the next few weeks. These instructions provide you with information on caring for yourself after your procedure. Your health care provider may also give you more specific instructions. Your treatment has been planned according to current medical practices, but problems sometimes occur. Call your health care provider if you have any problems or questions after your procedure.  WHAT TO EXPECT AFTER THE PROCEDURE  After the procedure, it is typical to experience:  Sleepiness.  Nausea and vomiting. HOME CARE INSTRUCTIONS  For the first 24 hours after general anesthesia:  Have a responsible person with you.  Do not drive a car. If you are alone, do not take public transportation.    Do not drink alcohol.  Do not take medicine that has not been prescribed by your health care provider.  Do not sign important papers or make important decisions.  You may resume a normal diet and activities as directed by your health care provider.  Change bandages (dressings) as directed.  If you have questions or problems that seem related to general anesthesia, call the hospital and ask for the anesthetist or anesthesiologist on call. SEEK MEDICAL CARE IF:  You have nausea and vomiting that continue the day after anesthesia.  You develop a rash. SEEK IMMEDIATE MEDICAL CARE IF:  You have difficulty breathing.  You have chest pain.  You have any allergic problems. Document Released: 12/30/2000 Document Revised: 05/26/2013 Document Reviewed: 04/08/2013  [facility] Patient Information 2014 [place], [place].

## 2014-05-02 NOTE — Anesthesia Preprocedure Evaluation (Signed)
Anesthesia Evaluation  Patient identified by MRN, date of birth, ID band Patient awake    Reviewed: Allergy & Precautions, H&P , NPO status , Patient's Chart, lab work & pertinent test results  History of Anesthesia Complications (+) PONV  Airway       Dental   Pulmonary Current Smoker,          Cardiovascular + dysrhythmias + Valvular Problems/Murmurs MVP     Neuro/Psych  Headaches,    GI/Hepatic hiatal hernia, GERD-  ,  Endo/Other    Renal/GU      Musculoskeletal   Abdominal   Peds  Hematology   Anesthesia Other Findings   Reproductive/Obstetrics                           Anesthesia Physical Anesthesia Plan  ASA: I  Anesthesia Plan: General   Post-op Pain Management:    Induction: Intravenous  Airway Management Planned: LMA  Additional Equipment:   Intra-op Plan:   Post-operative Plan: Extubation in OR  Informed Consent: I have reviewed the patients History and Physical, chart, labs and discussed the procedure including the risks, benefits and alternatives for the proposed anesthesia with the patient or authorized representative who has indicated his/her understanding and acceptance.     Plan Discussed with: CRNA, Anesthesiologist and Surgeon  Anesthesia Plan Comments:         Anesthesia Quick Evaluation

## 2014-05-02 NOTE — Transfer of Care (Signed)
Immediate Anesthesia Transfer of Care Note  Patient: Tamara Cross  Procedure(s) Performed: Procedure(s): BILATERIAL BREAST LUMPECTOMY WITH NEEDLE LOCALIZATION (Bilateral)  Patient Location: PACU  Anesthesia Type:General  Level of Consciousness: awake, alert , oriented and patient cooperative  Airway & Oxygen Therapy: Patient Spontanous Breathing  Post-op Assessment: Report given to PACU RN and Post -op Vital signs reviewed and stable  Post vital signs: Reviewed and stable  Complications: No apparent anesthesia complications

## 2014-05-02 NOTE — Op Note (Signed)
05/02/2014  4:22 PM  PATIENT:  Tamara Cross DOB: [birth year] MRN: [id number]  PREOP DIAGNOSIS:  DCIS right breast, lobular neoplasia of left breast in 2 areas  POSTOP DIAGNOSIS:   DCIS of right breast, 12 o'clock position (Tis, N0), and lobular neoplasia of left breast and microcalcifications - 2 areas at 12 and 2 o'clock   PROCEDURE:   Procedure(s):  BILATERIAL BREAST LUMPECTOMY WITH NEEDLE LOCALIZATION,  2 lumpectomies on left (12 o'clock and 2 o'clock), one lumpectomy on right (using 2 wires) [total of 4 wires for both breasts]  SURGEON:   Tamara Cross, M.D.  ANESTHESIA:   general  Anesthesiologist: Tamara Sable, MD CRNA: Tamara Loffler, CRNA; Tamara Less, CRNA; Tamara Alstrom, CRNA  General  EBL:  minimal  ml  DRAINS: none   LOCAL MEDICATIONS USED:   30 cc 1/4% marcaine  SPECIMEN:   Left breast at 12 o'clock, left breast biopsy at 2 o'clock, lateral/inferior margin of left breast biopsy at 2 o'clock, right breast lumpectomy (two wires)  COUNTS CORRECT:  YES  INDICATIONS FOR PROCEDURE:  Tamara Cross is a 48 y.o. (DOB: April 17, 1966) white  female whose primary care physician is No primary provider on file. and comes for bilateral breast lumpectomies.  She actually has 4 areas in her breast, 2 on the right (at 12 o'clock on right there is DCIS) and 2 areas on the left (both are lobular hyperplasia).  Options for breast cancer treatment were discussed with the patient. She elected to proceed with lumpectomy.  She will not need a lymph node biopsy.  She has seen Dr. Pearlie Cross from rad oncology, but has not seen medical oncology.    The indications and potential complications of surgery were explained to the patient. Potential complications include, but are not limited to, bleeding, infection, the need for further surgery, and nerve injury.     The patient had 4 needle localization wires placed: 2 at the 12 o'clock position of the right breast and one at 12 o'clock of  the left breast and one at 2 o'clock of the left breast at [facility] by Dr. Rosana Cross.   OPERATIVE NOTE:   The patient was taken to room # 8 at [facility] where she underwent a general anesthesia  supervised by Anesthesiologist: Tamara Sable, MD CRNA: Tamara Loffler, CRNA; Tamara Less, CRNA; Tamara Alstrom, CRNA. Both her breast and axilla were prepped with  ChloraPrep and sterilely draped.    A time-out and the surgical check list was reviewed.    I first started on the benign side, the left side.  I excised a block of tissue around the 12 o'clock wire approximated 2 x 5 cm.  Specimen mammogram confirmed the wire and the clip.  This was marked with a 6 color paint kit and sent as "12 o'clock left breast biopsy".   I then did the second biopsy on the left side at 2 o'clock.  I excised a block of tissue around the wire approximately 2 x 5 cm.  The wire was noted to be in dense breast tissue.  A specimen mammogram confirmed the intact wire, but did not see the clip.  This was marked with a 6 color paint kit and sent as "2 o'clock left breast biopsy".  I then excised a wider area in the dense breast tissue (lateral/inferior margin of left 2 o'clock breast biopsy) and shot a specimen mammogram of this additional tissue and the clip was  in the specimen.   I turned attention to the cancer which was about at the 12 o'clock position of the right breast. She had a second wire which came out of the skin about 2 to 3 cm below the wire which localized the cancer.  I took both wires and their localized areas in one specimen.  I cut down around the wires and tried to take an ellipse of breast tissue around the tumor by at least 1 cm.   I excised this block of breast tissue approximately 5 cm by 6 cm  in diameter.  I painted the lumpectomy specimen with the 6 color paint kit and did a specimen mammogram which confirmed the 2 clips and the 2 wires were all in the right position.    I then irrigated all  three wounds with saline. I infiltrated approximately 30 mL of 1% local between the  Incisions.  I placed 6 clips to mark biopsy cavity of the cancer (right breast), at 12, 3, 6, and 9 o'clock. Two clips were placed posteriorly in the biopsy.   I then closed all the wounds in layers using 3-0 Vicryl sutures for the deep layer. At the skin, I closed the incisions with a 5-0 Monocryl suture. The incisions were then painted with Dermabond.  She had gauze place over the wounds and placed in a breast binder.   The patient tolerated the procedure well, was transported to the recovery room in good condition. Sponge and needle count were correct at the end of the case.   Final pathology is pending.   Tamara Overall, MD, [facility] Pager: [phone] Office phone:  [phone]

## 2014-05-03 ENCOUNTER — Encounter (HOSPITAL_COMMUNITY): Payer: Self-pay | Admitting: Surgery

## 2014-05-04 ENCOUNTER — Encounter (INDEPENDENT_AMBULATORY_CARE_PROVIDER_SITE_OTHER): Payer: Self-pay | Admitting: Surgery

## 2014-05-04 ENCOUNTER — Other Ambulatory Visit (INDEPENDENT_AMBULATORY_CARE_PROVIDER_SITE_OTHER): Payer: Self-pay

## 2014-05-04 DIAGNOSIS — D0592 Unspecified type of carcinoma in situ of left breast: Secondary | ICD-10-CM

## 2014-05-06 ENCOUNTER — Telehealth: Payer: Self-pay | Admitting: *Deleted

## 2014-05-06 NOTE — Telephone Encounter (Signed)
Pt returned my call and I confirmed 05/26/14 appt w/ her.  Mailed before appt letter, welcoming packet & intake form to pt.  Emailed Holley Raring and [place] at [facility] to make them aware.

## 2014-05-06 NOTE — Telephone Encounter (Signed)
Left message for pt to return my call so I can schedule a Med Onc appt w/ her.

## 2014-05-09 ENCOUNTER — Other Ambulatory Visit: Payer: Self-pay | Admitting: Internal Medicine

## 2014-05-10 ENCOUNTER — Other Ambulatory Visit: Payer: Self-pay | Admitting: *Deleted

## 2014-05-10 ENCOUNTER — Other Ambulatory Visit: Payer: Self-pay

## 2014-05-10 MED ORDER — METOPROLOL SUCCINATE ER 50 MG PO TB24: ORAL_TABLET | ORAL | Status: DC

## 2014-05-17 ENCOUNTER — Ambulatory Visit (INDEPENDENT_AMBULATORY_CARE_PROVIDER_SITE_OTHER): Payer: 59 | Admitting: Surgery

## 2014-05-17 ENCOUNTER — Encounter (INDEPENDENT_AMBULATORY_CARE_PROVIDER_SITE_OTHER): Payer: Self-pay | Admitting: Surgery

## 2014-05-17 VITALS — BP 126/78 | HR 80 | Temp 97.9°F | Ht 64.0 in | Wt 194.8 lb

## 2014-05-17 DIAGNOSIS — D0591 Unspecified type of carcinoma in situ of right breast: Secondary | ICD-10-CM

## 2014-05-17 DIAGNOSIS — D0590 Unspecified type of carcinoma in situ of unspecified breast: Secondary | ICD-10-CM

## 2014-05-17 NOTE — Progress Notes (Signed)
URGENT Office [name] 48 y.o.  Body mass index is 33.42 kg/(m^2).  Patient Active Problem List   Diagnosis Date Noted  . LBP (low back pain) 04/26/2014  . Rash and nonspecific skin eruption 04/26/2014  . Malignant neoplasm of upper-outer quadrant of female breast 04/13/2014  . Breast cancer in situ, right breast, UOQ 04/01/2014  . Palpitations 11/18/2013  . Allergy   . Heart murmur   . HIP PAIN 09/07/2010  . PEDAL EDEMA 04/26/2010  . EXTERNAL HEMORRHOIDS 01/24/2010  . HIATAL HERNIA 01/24/2010  . Reflux esophagitis 12/19/2009  . GERD 12/19/2009  . Irritable bowel syndrome 12/19/2009  . 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  . MIGRAINE HEADACHE 08/23/2008  . ESOPHAGEAL STRICTURE 03/11/2008  . POLYARTHRALGIA 03/11/2008  . OBESITY, MODERATE 05/01/2007  . ANXIETY 05/01/2007  . DEPRESSION 05/01/2007  . PREMATURE VENTRICULAR CONTRACTIONS 05/01/2007  . ALLERGIC RHINITIS 05/01/2007  . MITRAL VALVE PROLAPSE, HX OF 05/01/2007  . PUD, HX OF 05/01/2007  . IRRITABLE BOWEL SYNDROME, HX OF 05/01/2007    Allergies  Allergen Reactions  . Imitrex [Sumatriptan] Anaphylaxis  . Sulfonamide Derivatives Shortness Of Breath  . Reglan [Metoclopramide] Other (See Comments)    REACTION: Possible cause of SVT.  [name] Zofran [Ondansetron Hcl] Other (See Comments)    REACTION: Possible cause of SVT  . Ondansetron Palpitations    Past Surgical History  Procedure Laterality Date  . Cesarean section  2002  . Appendectomy    . Tonsillectomy    . Cystectomy      left breast,in 20's  . Breast lumpectomy    . Shoulder surgery Left     debridement ,bone spurs  . Dilation and curettage of uterus      x2  . Vag ablation  13  . Breast lumpectomy with needle localization Bilateral 05/02/2014    Procedure: BILATERIAL BREAST LUMPECTOMY WITH NEEDLE LOCALIZATION;  Surgeon: Shann Medal, MD;  Location: [facility];  Service: General;  Laterality: Bilateral;   Walker Kehr, MD No diagnosis found.  This is a postop  visit in the urgent office since our office has been unable to make her an appointment with Dr. Lucia Gaskins and it has been one month since her surgery. This doesn't appear to be Dr. Pollie Friar availability but communications at the front desk level.  I saw her and examined her 3 incisions and they all appear to be healing properly. There is no redness. There is no warmth. She has a usual degree of discomfort with motion. I gave her a copy of her path report which Dr. Lucia Gaskins had discussed with her. I'll try to get her in to see him for a postoperative visit next week. Matt B. Hassell Done, MD, [facility] [phone] beeper [phone]  05/17/2014 4:31 PM

## 2014-05-20 ENCOUNTER — Encounter: Payer: Self-pay | Admitting: *Deleted

## 2014-05-20 NOTE — Progress Notes (Signed)
Completed chart, added to spreadsheet and placed in MD's box.

## 2014-05-23 ENCOUNTER — Ambulatory Visit (INDEPENDENT_AMBULATORY_CARE_PROVIDER_SITE_OTHER): Payer: 59 | Admitting: Surgery

## 2014-05-23 VITALS — BP 132/82 | HR 82 | Temp 98.7°F | Ht 64.0 in | Wt 194.0 lb

## 2014-05-23 DIAGNOSIS — D0591 Unspecified type of carcinoma in situ of right breast: Secondary | ICD-10-CM

## 2014-05-23 DIAGNOSIS — D0590 Unspecified type of carcinoma in situ of unspecified breast: Secondary | ICD-10-CM

## 2014-05-23 NOTE — Progress Notes (Signed)
Re:   Tamara Cross DOB:   1966/06/15 MRN:   [id number]  ASSESSMENT AND PLAN: 1.  Right breast cancer, DCIS, upper outer quadrant, Tis  Biopsy - 03/28/2014 ([id number]) - Right breast - DCIS, Left breast - LCIS/ALH  Lumpectomy - 05/02/2014 ([id number]) - DCIS, 1 cm.  Margins are clear  Oncology - [facility]  Plan - 1) sees Dr. Lindi Adie this week for discussion of chemoprevention, 2) she will call Dr. Isidore Moos for follow up, 3) I'll see her back in 6 months, 4) she will need MRI's in the mix of her follow up breast imaging   1A. Second area of right breast seen on MRI  Biopsy - 04/14/2014 ([id number]) - Right core biopsy - negative, microcalcs seen.  I excised this in the same incision as the DCIS (see above)  2.  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 Surgical excision of the atypical lobular hyperplasia is recommended.   Left breast at 9 o'clock a fibroadenoma was reported histologically.  Lumpectomy x 2 - 05/02/2014 ([id number]) - LOBULAR NEOPLASIA (Lobular carcinoma insitu and Atypical lobular hyperplasia), Margins less the 0.1 mm 3.  Depression and anxiety 4.  History of tachycardias with shoulder surgery. 5.  C7-T1 cyst of spine - saw Joya Salm  She will check with their recommendations 6.  2 cm mass (probable lipoma) left anterior thigh  She may get this taken out as an outpatient.  I do not need to see her back to schedule this.  Chief Complaint  Patient presents with  . Breast Cancer Long Term Follow Up   REFERRING PHYSICIAN: Walker Kehr, MD  HISTORY OF PRESENT ILLNESS: Tamara Cross is a 48 y.o. (DOB: 04/30/1966)  white  female whose primary care physician is Walker Kehr, MD and comes to me today for follow up of  bilateral breast lumpectomies.  Only the right breast lumpectomy was malignant and this was DCIS. She is accompanied by her husband. I reviewed path report with patient, went over findings, talked about chemo prevention, talked about genetics, and talked about follow up.  History of breast changes: She had a mass removed from her left breast in her 20's (probably fibroadenoma).  She said more recently she had a small cyst of her left breast that has been watched.  It never required a biopsy.  She has not had significant breast disease.  No fam hx of breast cancer, though she has a strong fam hx of colon ca.  She is not on hormones.  She had her last menstrual period around 2013.  Mammogram on 03/10/2014 - 1. Indeterminate calcifications in the upper-outer right breast mid  to posterior depth. 2. Indeterminate calcifications in the upper-outer left breast mid to posterior depth. 3. Probably benign 4 mm group of calcifications in the superior left breast anterior depth.  Biopsy - 03/28/2014 [id number]) - Right breast - DCIS, Left breast - [facility]   Past Medical History  Diagnosis Date  . Allergy     chroni rhinitis  . GERD (gastroesophageal reflux disease)   . Ulcer     previously had  . Heart murmur     MVP  . Migraine   . Arthritis   . 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  Complication of anesthesia     last 2 surgeries had some tachycardia  . Dysrhythmia     tachy occ  . Pneumonia     hx  . Depression   . Anxiety   . MVP (mitral valve prolapse)   . H/O hiatal hernia      Past Surgical History  Procedure Laterality Date  . Cesarean section  2002  . Appendectomy    . Tonsillectomy    . Cystectomy      left breast,in 20's  . Breast lumpectomy    . Shoulder surgery Left     debridement ,bone spurs  . Dilation and curettage of uterus      x2  . Vag ablation  13  . Breast lumpectomy with needle localization Bilateral 05/02/2014     Procedure: BILATERIAL BREAST LUMPECTOMY WITH NEEDLE LOCALIZATION;  Surgeon: Shann Medal, MD;  Location: [facility];  Service: General;  Laterality: Bilateral;     Current Outpatient Prescriptions  Medication Sig Dispense Refill  . acetaminophen (TYLENOL) 500 MG tablet Take 1,000 mg by mouth daily as needed for mild pain.      . cholecalciferol (VITAMIN D) 1000 UNITS tablet Take 1 tablet (1,000 Units total) by mouth daily.  100 tablet  3  . clonazePAM (KLONOPIN) 0.5 MG tablet Take 0.5 mg by mouth at bedtime as needed for anxiety.       [name] desvenlafaxine (PRISTIQ) 100 MG 24 hr tablet Take 100 mg by mouth daily.        [name] HYDROcodone-acetaminophen (NORCO/VICODIN) 5-325 MG per tablet Take 1-2 tablets by mouth every 6 (six) hours as needed.  30 tablet  0  . lamoTRIgine (LAMICTAL) 100 MG tablet Take 100-200 mg by mouth 2 (two) times daily. Takes 200mg  every morning and 100mg  every night at bedtime.      . methocarbamol (ROBAXIN) 500 MG tablet Take 500 mg by mouth daily as needed for muscle spasms.       . metoprolol succinate (TOPROL-XL) 50 MG 24 hr tablet Take 1 tablet (50 mg total) by mouth daily with or  immediately following a  meal.  14 tablet  0  . traMADol (ULTRAM) 50 MG tablet Take 1-2 tablets (50-100 mg total) by mouth 2 (two) times daily as needed.  60 tablet  1  . traZODone (DESYREL) 100 MG tablet Take 100 mg by mouth at bedtime.       . valACYclovir (VALTREX) 500 MG tablet Take 500 mg by mouth daily as needed (for breakouts).        No current facility-administered medications for this visit.      Allergies  Allergen Reactions  . Imitrex [Sumatriptan] Anaphylaxis  . Sulfonamide Derivatives Shortness Of Breath  . Reglan [Metoclopramide] Other (See Comments)    REACTION: Possible cause of SVT.  [name] Zofran [Ondansetron Hcl] Other (See Comments)    REACTION: Possible cause of SVT  . Ondansetron Palpitations    REVIEW OF SYSTEMS: Cardiac:  MVP.  Sees Dr. Olin Pia.  Has history of  tachycardia in recovery room post shoulder surgery. Pulmonary:  Smoked in her 17 and 30's.  Now smokes socially.   Gastrointestinal:  History of PUD and HH.  Her PUD is resolved.  Nexium controls symptoms from the [facility].  No history of liver disease.  No history of gall bladder disease.  No history of pancreas disease.  History of appendectomy.  Strong family history of colon ca - gets regular colonoscopies by Dr. Olevia Perches.  Last colonoscopy about 2010. Urologic:  No history of kidney stones.  No history of bladder infections. GYN:  Sees Dr. Jerilynn Mages. [place]. Musculoskeletal:  Left shoulder surgery for impingement by Dr. French Ana - Jan and April, 2015 Hematologic:  No bleeding disorder.  No history of anemia.  Not anticoagulated. Psycho-social:  Depression and anxiety  SOCIAL and FAMILY HISTORY: Married. She works for [employer] with her. Has on son who is about to turn 57. Her mother in law, who was with her, had Stage 3 breast cancer about 6 years ago and is doing well.  PHYSICAL EXAM: BP 132/82  Pulse 82  Temp(Src) 98.7 F (37.1 C)  Ht 5\' 4"  (1.626 m)  Wt 194 lb (87.998 kg)  BMI 33.28 kg/m2  General: WN WF who is alert and generally healthy appearing.  HEENT: Normal. Pupils equal. Neck: Supple. No mass.  No thyroid mass. Lymph Nodes:  No supraclavicular or cervical nodes. Lungs: Clear to auscultation and symmetric breath sounds. Heart:  RRR. No murmur or rub. Breast:  Right - upper outer quadrant incision looks good  Left - Upper outer quadrant incision x 2 looks good  DATA REVIEWED: Epic notes.  Alphonsa Overall, MD,  [facility], [address]  [place], [place]    [place] Phone:  [phone] FAX:  [phone]

## 2014-05-26 ENCOUNTER — Ambulatory Visit (HOSPITAL_BASED_OUTPATIENT_CLINIC_OR_DEPARTMENT_OTHER): Payer: 59 | Admitting: Hematology and Oncology

## 2014-05-26 ENCOUNTER — Telehealth: Payer: Self-pay | Admitting: Hematology and Oncology

## 2014-05-26 ENCOUNTER — Encounter: Payer: Self-pay | Admitting: Hematology and Oncology

## 2014-05-26 ENCOUNTER — Other Ambulatory Visit: Payer: 59

## 2014-05-26 ENCOUNTER — Ambulatory Visit: Payer: 59

## 2014-05-26 VITALS — BP 128/67 | HR 80 | Temp 98.0°F | Resp 20 | Ht 64.0 in | Wt 198.8 lb

## 2014-05-26 DIAGNOSIS — C50411 Malignant neoplasm of upper-outer quadrant of right female breast: Secondary | ICD-10-CM

## 2014-05-26 DIAGNOSIS — Z170 Estrogen receptor positive status [ER+]: Secondary | ICD-10-CM

## 2014-05-26 DIAGNOSIS — D0590 Unspecified type of carcinoma in situ of unspecified breast: Secondary | ICD-10-CM

## 2014-05-26 NOTE — Progress Notes (Signed)
Checked in new pt with no financial concerns. °

## 2014-05-26 NOTE — Assessment & Plan Note (Signed)
1. 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 I discussed with the patient results of pathology and the significance of estrogen and progesterone receptors. Patient is still perimenopausal and I recommended antiestrogen therapy with tamoxifen but we'll start that once radiation is complete.  2. LCIS and atypical lobular hyperplasia in the lumpectomy specimen from the left breast: Patient will benefit from tamoxifen therapy.  3. Survivorship: I encouraged her to do more physical activity and lose weight. Aerobic exercises and weightbearing exercises were recommended 6 days a week. I encouraged her to increase her fruits and vegetables and decrease red meat intake.
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
## 2014-05-26 NOTE — Telephone Encounter (Signed)
, °

## 2014-05-26 NOTE — Progress Notes (Signed)
MD Notes from office date 05/26/14 - copy to patient.  Original to scan.   FU appt with Dr Birdie Hopes made for Mon am at 730.

## 2014-05-27 NOTE — Progress Notes (Signed)
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 - MARGINS NOT INVOLVED. - CLOSEST MARGIN INFERIOR AT 0.1 CM. - FIBROCYSTIC CHANGES WITH CALCIFICATIONS. 2. Breast, lumpectomy, left - LOBULAR NEOPLASIA (ATYPICAL LOBULAR HYPERPLASIA). - MARGINS NOT INVOLVED. - CLOSEST MARGIN MEDIAL AT 0.1 CM. - FIBROCYSTIC CHANGES WITH CALCIFICATIONS. 3. 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 - MARGINS NOT INVOLVED. - LOBULAR NEOPLASIA IS 0.1 CM FROM THE MARGIN. 4. Breast, lumpectomy, right - DUCTAL CARCINOMA IN SITU, 1 CM. - MARGINS NOT INVOLVED. - LOBULAR NEOPLASIA (ATYPICAL LOBULAR HYPERPLASIA). - FIBROCYSTIC CHANGES WITH CALCIFICATIONS.  04/14/14 Diagnosis Breast, right, needle core biopsy, upper central - BENIGN BREAST TISSUE, SEE COMMENT. - NEGATIVE FOR ATYPIA OR MALIGNANCY. - MICROCALCIFICATIONS IDENTIFIED.  04/12/14 Diagnosis 1. Breast, left, needle core biopsy, 12 o'clock - LOBULAR NEOPLASIA (ATYPICAL HYPERPLASIA AND IN SITU CARCINOMA). SEE COMMENT. - CALCIFICATIONS PRESENT. 2. Breast, left, needle core biopsy - BIPHASIC EPITHELIAL AND STROMAL LESION, SEE COMMENT. - CALCIFICATIONS PRESENT.  03/28/14  Diagnosis  1. Breast, left, needle core biopsy, UOQ  - LOBULAR NEOPLASIA (ATYPICAL LOBULAR HYPERPLASIA), SEE COMMENT.  - CALCIFICATIONS IDENTIFIED.  2. Breast, right, needle core biopsy, UOQ  - MAMMARY CARCINOMA IN SITU, SEE COMMENT.  - CALCIFICATIONS IDENTIFIED.  Microscopic Comment  1. Multiple needle core biopsies demonstrate foci of atypical  The in situ carcinoma demonstrates strong diffuse E-cadherin expression; supportive of a ductal origin   Receptor Status: ER(100%), PR (65%), Her2-neu ()   Presentation: Ms  Hagarty had a mass removed from her left breast in her 20's (probably fibroadenoma). She said more recently she had a small cyst of her left breast that has been watched. It never required a biopsy. She has not had significant breast disease.  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 She had routine mammogram that identified abnormalities in the breast she subsequently underwent right breast core needle biopsy on 04/14/2014 as well as a left breast biopsy.  Initially right breast biopsy was negative and left breast biopsy showed lobular neoplasia atypical lobular hyperplasia. She underwent lumpectomy in the right breast that revealed DCIS. She had 3 lumpectomies in the left breast that revealed LCIS and atypical lobular hyperplasia.  She had a mass removed from her left breast in her 50's (probably fibroadenoma). She said more recently she had a small cyst of her left breast that has been watched. It never required a biopsy. She has not had significant breast disease .  Mammogram on 03/10/2014 - 1. Indeterminate calcifications in the upper-outer right breast mid  to posterior depth. 2. Indeterminate calcifications in the upper-outer left breast mid to posterior depth. 3. Probably benign 4 mm group of calcifications in the superior left breast anterior depth     Past/Anticipated interventions by surgeon, if any: Dr. Alphonsa Overall - Left Breast Needle core Biopsy - UOQ and Right Breast Biospy   Past/Anticipated interventions by medical oncology, if any: Dr. Rulon Eisenmenger: Antiestrogen Therapy after Radiation Therapy   Lymphedema issues, if any:  Pain issues, if RCB:ULAGTXMIWO  Breasts  to touch where incisions are right and left breast, well healed   SAFETY ISSUES:  Prior radiation? NO  Pacemaker/ICD? NO  Possible current pregnancy? NO  Is the patient on methotrexate? NO  Current Complaints / other details: G1.P1, No hormonal use. Last cycle 2013. Hotflashes  menses age 1,

## 2014-05-30 ENCOUNTER — Encounter: Payer: Self-pay | Admitting: Radiation Oncology

## 2014-05-30 ENCOUNTER — Telehealth: Payer: Self-pay | Admitting: *Deleted

## 2014-05-30 ENCOUNTER — Ambulatory Visit
Admission: RE | Admit: 2014-05-30 | Discharge: 2014-05-30 | Disposition: A | Discharge status: Home / Self Care | Payer: 59 | Source: Ambulatory Visit | Attending: Radiation Oncology | Admitting: Radiation Oncology

## 2014-05-30 VITALS — BP 124/66 | HR 74 | Temp 98.1°F | Resp 20 | Ht 64.25 in | Wt 194.3 lb

## 2014-05-30 DIAGNOSIS — C50411 Malignant neoplasm of upper-outer quadrant of right female breast: Secondary | ICD-10-CM

## 2014-05-30 DIAGNOSIS — D0592 Unspecified type of carcinoma in situ of left breast: Secondary | ICD-10-CM

## 2014-05-30 DIAGNOSIS — C50312 Malignant neoplasm of lower-inner quadrant of left female breast: Secondary | ICD-10-CM

## 2014-05-30 DIAGNOSIS — Z510 Encounter for antineoplastic radiation therapy: Secondary | ICD-10-CM | POA: Diagnosis not present

## 2014-05-30 NOTE — Telephone Encounter (Signed)
Received results of spine MRI from [facility]. Sent to be scanned.

## 2014-05-30 NOTE — Progress Notes (Signed)
Please see the Nurse Progress Note in the MD Initial Consult Encounter for this patient. 

## 2014-05-30 NOTE — Progress Notes (Signed)
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     She underwent surgery on 7-27 in the form of  1) left lumpectomy: lobular neoplasia (LCIS and ALH) with negative margins 2) left lumpectomy: lobular neoplasia [facility]) with negative margins 3) left lateral inferior lumpectomy: lobular neoplasia (LCIS and ALH) with negative margins 4) right lumpectomy: int grade DCIS, ER/PR +, 1cm span, negative margins by 0.8cm. [place] in specimen as well.  Postoperatively, she is doing relatively well.  She complains of some postoperative tenderness.  Plans to return to clinic w/ Dr Lindi Adie in 2 months after completing breast radiation to initiate tamoxifen therapy.                 ALLERGIES:  is allergic to imitrex; sulfonamide derivatives; reglan; zofran; and ondansetron.  Meds: Current Outpatient Prescriptions  Medication Sig Dispense Refill  . acetaminophen (TYLENOL) 500 MG tablet Take 1,000 mg by mouth daily as needed for mild pain.      . clonazePAM (KLONOPIN) 0.5 MG tablet Take 0.5 mg by mouth at bedtime as needed for anxiety.       [name] desvenlafaxine (PRISTIQ) 100 MG 24 hr tablet Take 100 mg by mouth daily.        [name] lamoTRIgine (LAMICTAL) 100 MG tablet Take 100-200 mg by mouth 2 (two) times daily. Takes 228m every morning and 1018mevery night at bedtime.      . methocarbamol (ROBAXIN) 500 MG tablet Take 500 mg by mouth daily as needed for muscle spasms.       . metoprolol succinate (TOPROL-XL) 50 MG 24 hr tablet Take 1 tablet (50 mg total) by mouth daily with or  immediately following a  meal.  14 tablet  0  . traMADol (ULTRAM) 50 MG tablet Take 1-2 tablets (50-100 mg total) by  mouth 2 (two) times daily as needed.  60 tablet  1  . traZODone (DESYREL) 100 MG tablet Take 100 mg by mouth at bedtime.       . valACYclovir (VALTREX) 500 MG tablet Take 500 mg by mouth daily as needed (for breakouts).        No current facility-administered medications for this encounter.    Physical Findings: The patient is in no acute distress. Patient is alert and oriented.  height is 5' 4.25" (1.632 m) and weight is 194 lb 4.8 oz (88.134 kg). Her oral temperature is 98.1 F (36.7 C). Her blood pressure is 124/66 and her pulse is 74. Her respiration is 20. .   Bilateral breast lumpectomy scars healing well.  Seroma notable in right breast lumpectomy cavity.  No sign of infection.  Lab Findings: Lab Results  Component Value Date   WBC 7.4 04/26/2014   HGB 13.4 04/26/2014   HCT 41.3 04/26/2014   MCV 94.3 04/26/2014   PLT 219 04/26/2014       Radiographic Findings: Mm Breast Surgical Specimen  05/02/2014   CLINICAL DATA:  Patient status post surgical excision right breast for ductal carcinoma in situ and adjacent discordant MRI guided core needle biopsy.  EXAM: SPECIMEN RADIOGRAPH OF THE RIGHT BREAST  COMPARISON:  Previous exam(s)  FINDINGS: Status  post excision of the right breast. The wire tips and biopsy marking clips are present and are marked for pathology.  IMPRESSION: Specimen radiograph of the right breast.   Electronically Signed   By: Lovey Newcomer M.D.   On: 05/02/2014 16:42   Mm Breast Surgical Specimen  05/02/2014   CLINICAL DATA:  Patient status post surgical excision right breast for ductal carcinoma in situ and adjacent discordant MRI guided core needle biopsy.  EXAM: SPECIMEN RADIOGRAPH OF THE RIGHT BREAST  COMPARISON:  Previous exam(s)  FINDINGS: Status post excision of the right breast. The wire tips and biopsy marking clips are present and are marked for pathology.  IMPRESSION: Specimen radiograph of the right breast.   Electronically Signed   By: Lovey Newcomer M.D.   On:  05/02/2014 16:42   Mm Breast Surgical Specimen  05/02/2014   CLINICAL DATA:  Patient status post surgical excision for left breast lobular neoplasia.  EXAM: SPECIMEN RADIOGRAPH OF THE LEFT BREAST  COMPARISON:  Previous exam(s)  FINDINGS: Status post excision of the left breast. The biopsy marker clip is present and marked for pathology. Note the distal aspect of the Kopan's wire is located on a separate specimen.  IMPRESSION: Specimen radiograph of the left breast.   Electronically Signed   By: Lovey Newcomer M.D.   On: 05/02/2014 16:40   Mm Breast Surgical Specimen  05/02/2014   CLINICAL DATA:  Patient status post surgical excision left breast for lobular neoplasia.  EXAM: SPECIMEN RADIOGRAPH OF THE LEFT BREAST  COMPARISON:  Previous exam(s)  FINDINGS: Status post excision of the left breast. Cylinder-shaped biopsy marking clip and distal aspect of the Kopan's wire is present within the specimen. The clip is marked for pathology.  IMPRESSION: Specimen radiograph of the left breast.   Electronically Signed   By: Lovey Newcomer M.D.   On: 05/02/2014 16:39   Mm Lt Plc Breast Loc Dev   1st Lesion  Inc Mammo Guide  05/02/2014   CLINICAL DATA:  Patient with recent diagnosis lobular neoplasia in the left breast at the X shaped and coil shaped marking clips. The ribbon shaped marking clip within the medial left breast, 9 o'clock, demonstrated benign pathology results and is not localized for excision.  EXAM: NEEDLE LOCALIZATION OF THE LEFT BREAST WITH MAMMO GUIDANCE  COMPARISON:  Previous exams.  FINDINGS: Patient presents for needle localization prior to left breast surgical excision. I met with the patient and we discussed the procedure of needle localization including benefits and alternatives. We discussed the high likelihood of a successful procedure. We discussed the risks of the procedure, including infection, bleeding, tissue injury, and further surgery. Informed, written consent was given. The usual time-out  protocol was performed immediately prior to the procedure.  Using mammographic guidance, sterile technique, 2% lidocaine and a 7 cm modified Kopans needle, the X shaped biopsy marking clip within the lateral left breast was localized using cranial approach.  Using mammographic guidance, sterile technique, 2% lidocaine and a 5 cm modified Kopans needle, the cylinder-shaped marking clip within the central left breast was localized using cranial approach.  The films were marked for Dr. Lucia Gaskins.  IMPRESSION: Needle localization of the X shaped and cylinder-shaped biopsy marking clips within the left breast. No apparent complications.   Electronically Signed   By: Lovey Newcomer M.D.   On: 05/02/2014 14:31   Mm Lt Plc Breast Loc Dev   Ea Add Lesion  Inc Mammo Guide  05/02/2014   CLINICAL DATA:  Patient with recent diagnosis lobular neoplasia in the left breast at the X shaped and coil shaped marking clips. The ribbon shaped marking clip within the medial left breast, 9 o'clock, demonstrated benign pathology results and is not localized for excision.  EXAM: NEEDLE LOCALIZATION OF THE LEFT BREAST WITH MAMMO GUIDANCE  COMPARISON:  Previous exams.  FINDINGS: Patient presents for needle localization prior to left breast surgical excision. I met with the patient and we discussed the procedure of needle localization including benefits and alternatives. We discussed the high likelihood of a successful procedure. We discussed the risks of the procedure, including infection, bleeding, tissue injury, and further surgery. Informed, written consent was given. The usual time-out protocol was performed immediately prior to the procedure.  Using mammographic guidance, sterile technique, 2% lidocaine and a 7 cm modified Kopans needle, the X shaped biopsy marking clip within the lateral left breast was localized using cranial approach.  Using mammographic guidance, sterile technique, 2% lidocaine and a 5 cm modified Kopans needle, the  cylinder-shaped marking clip within the central left breast was localized using cranial approach.  The films were marked for Dr. Lucia Gaskins.  IMPRESSION: Needle localization of the X shaped and cylinder-shaped biopsy marking clips within the left breast. No apparent complications.   Electronically Signed   By: Lovey Newcomer M.D.   On: 05/02/2014 14:31   Mm Rt Plc Breast Loc Dev   1st Lesion  Inc Mammo Guide  05/02/2014   CLINICAL DATA:  Patient with recent diagnosis right breast ductal carcinoma in situ. Additionally the patient is for localization of discordant MRI guided core needle biopsy within the right breast.  EXAM: NEEDLE LOCALIZATION OF THE RIGHT BREAST WITH MAMMO GUIDANCE  COMPARISON:  Previous exams.  FINDINGS: Patient presents for needle localization prior to right breast lumpectomy. I met with the patient and we discussed the procedure of needle localization including benefits and alternatives. We discussed the high likelihood of a successful procedure. We discussed the risks of the procedure, including infection, bleeding, tissue injury, and further surgery. Informed, written consent was given. The usual time-out protocol was performed immediately prior to the procedure.  Using mammographic guidance, sterile technique, 2% lidocaine and a 7 cm modified Kopans needle, the X shaped biopsy marking clip within the lateral right breast was localized using a cranial approach.  Using mammographic guidance, sterile technique, 2% lidocaine and a 7 cm modified Kopans needle, the cylinder-shaped clip within the lateral right breast was localized using a cranial approach.  The films were marked for Dr. Lucia Gaskins.  IMPRESSION: Needle localization X shaped and cylinder-shaped biopsy marking clips within the right breast. No apparent complications.   Electronically Signed   By: Lovey Newcomer M.D.   On: 05/02/2014 14:31   Mm Rt Plc Breast Loc Dev   Ea Add Lesion  Inc Mammo Guide  05/02/2014   CLINICAL DATA:  Patient with  recent diagnosis right breast ductal carcinoma in situ. Additionally the patient is for localization of discordant MRI guided core needle biopsy within the right breast.  EXAM: NEEDLE LOCALIZATION OF THE RIGHT BREAST WITH MAMMO GUIDANCE  COMPARISON:  Previous exams.  FINDINGS: Patient presents for needle localization prior to right breast lumpectomy. I met with the patient and we discussed the procedure of needle localization including benefits and alternatives. We discussed the high likelihood of a successful procedure. We discussed the risks of the procedure, including infection, bleeding, tissue injury, and further surgery. Informed, written consent was given. The usual time-out protocol  was performed immediately prior to the procedure.  Using mammographic guidance, sterile technique, 2% lidocaine and a 7 cm modified Kopans needle, the X shaped biopsy marking clip within the lateral right breast was localized using a cranial approach.  Using mammographic guidance, sterile technique, 2% lidocaine and a 7 cm modified Kopans needle, the cylinder-shaped clip within the lateral right breast was localized using a cranial approach.  The films were marked for Dr. Lucia Gaskins.  IMPRESSION: Needle localization X shaped and cylinder-shaped biopsy marking clips within the right breast. No apparent complications.   Electronically Signed   By: Lovey Newcomer M.D.   On: 05/02/2014 14:31    Impression/Plan:  DCIS, right breast. LCIS/ALH, left breast. She will eventually undergo Tamoxifen to prevent recurrences in both breasts.  We discussed adjuvant radiotherapy today.  I recommend 4-6 weeks of right breast radiotherapy in order to decrease risk of recurrence in that breast by half.  The risks, benefits and side effects of this treatment were discussed in detail.  She understands that radiotherapy is associated with skin irritation and fatigue in the acute setting. Late effects can include cosmetic changes and rare injury to  internal organs.   She is enthusiastic about proceeding with treatment. A consent form has been signed today. Simulation to be in a few weeks to allow further healing/ seroma resolution.  Of note, she will not need post operative mammogram to rule out residual calcifications after lumpectomy. I discussed this with radiology today.  I spent 25 minutes face to face with the patient and more than 50% of that time was spent in counseling and/or coordination of care. _____________________________________   Eppie Gibson, MD

## 2014-06-20 ENCOUNTER — Ambulatory Visit
Admission: RE | Admit: 2014-06-20 | Discharge: 2014-06-20 | Disposition: A | Discharge status: Home / Self Care | Payer: 59 | Source: Ambulatory Visit | Attending: Radiation Oncology | Admitting: Radiation Oncology

## 2014-06-20 DIAGNOSIS — Z510 Encounter for antineoplastic radiation therapy: Secondary | ICD-10-CM | POA: Diagnosis not present

## 2014-06-20 DIAGNOSIS — C50411 Malignant neoplasm of upper-outer quadrant of right female breast: Secondary | ICD-10-CM

## 2014-06-20 NOTE — Progress Notes (Signed)
  Radiation Oncology         (336) [phone] ________________________________  Name: Tamara Cross MRN: [id number]  Date: 06/20/2014  DOB: 12-17-1965  SIMULATION AND TREATMENT PLANNING NOTE  Outpatient  DIAGNOSIS:     ICD-9-CM ICD-10-CM  1. Malignant neoplasm of upper-outer quadrant of female breast, right 174.4 C50.411     NARRATIVE:  The patient was brought to the [facility].  Identity was confirmed.  All relevant records and images related to the planned course of therapy were reviewed.  The patient freely provided informed written consent to proceed with treatment after reviewing the details related to the planned course of therapy. The consent form was witnessed and verified by the simulation staff.    Then, the patient was set-up in a stable reproducible  supine position for radiation therapy.  CT images were obtained.  Surface markings were placed.  The CT images were loaded into the planning software.    TREATMENT PLANNING NOTE: Treatment planning then occurred.  The radiation prescription was entered and confirmed.    A total of 3 medically necessary complex treatment devices were fabricated and supervised by me- 2 tangential fields with MLCs to block heart, lung; and, a vaclock to secure head and arm. I have requested : 3D Simulation  I have requested a DVH of the following structures: lungs, heart, lumpectomy cavity.   The patient will receive 50 Gy in 25 fractions to the right breast. This will be followed by a boost of 10 Gy in 5 fractions  Optical Surface Tracking Plan:  Since intensity modulated radiotherapy (IMRT) and 3D conformal radiation treatment methods are predicated on accurate and precise positioning for treatment, intrafraction motion monitoring is medically necessary to ensure accurate and safe treatment delivery. The ability to quantify intrafraction motion without excessive ionizing radiation dose can only be performed with optical surface  tracking. Accordingly, surface imaging offers the opportunity to obtain 3D measurements of patient position throughout IMRT and 3D treatments without excessive radiation exposure. I am ordering optical surface tracking for this patient's upcoming course of radiotherapy.  ________________________________   Reference:  Ursula Alert, J, et al. Surface imaging-based analysis of intrafraction motion for breast radiotherapy patients.Journal of [place], n. 6, nov. 2014. ISSN [id number].  Available at: <http://www.jacmp.org/index.php/jacmp/article/view/4957>.  -----------------------------------  Eppie Gibson, MD

## 2014-06-22 ENCOUNTER — Encounter: Payer: Self-pay | Admitting: Internal Medicine

## 2014-06-22 DIAGNOSIS — Z510 Encounter for antineoplastic radiation therapy: Secondary | ICD-10-CM | POA: Diagnosis not present

## 2014-06-27 ENCOUNTER — Ambulatory Visit
Admission: RE | Admit: 2014-06-27 | Discharge: 2014-06-27 | Disposition: A | Discharge status: Home / Self Care | Payer: 59 | Source: Ambulatory Visit | Attending: Radiation Oncology | Admitting: Radiation Oncology

## 2014-06-27 DIAGNOSIS — C50411 Malignant neoplasm of upper-outer quadrant of right female breast: Secondary | ICD-10-CM

## 2014-06-27 DIAGNOSIS — Z510 Encounter for antineoplastic radiation therapy: Secondary | ICD-10-CM | POA: Diagnosis not present

## 2014-06-27 NOTE — Progress Notes (Signed)
Simulation Verification Note   ICD-9-CM ICD-10-CM  1. Malignant neoplasm of upper-outer quadrant of female breast, right 174.4 C50.411    The patient was brought to the treatment unit and placed in the planned treatment position. The clinical setup was verified. Then port films were obtained and uploaded to the radiation oncology medical record software.  The treatment beams were carefully compared against the planned radiation fields. The position location and shape of the radiation fields was reviewed. They targeted volume of tissue appears to be appropriately covered by the radiation beams. Organs at risk appear to be excluded as planned.  Based on my personal review, I approved the simulation verification. The patient's treatment will proceed as planned.  -----------------------------------  Eppie Gibson, MD

## 2014-06-28 ENCOUNTER — Ambulatory Visit
Admission: RE | Admit: 2014-06-28 | Discharge: 2014-06-28 | Disposition: A | Discharge status: Home / Self Care | Payer: 59 | Source: Ambulatory Visit | Attending: Radiation Oncology | Admitting: Radiation Oncology

## 2014-06-28 DIAGNOSIS — Z510 Encounter for antineoplastic radiation therapy: Secondary | ICD-10-CM | POA: Diagnosis not present

## 2014-06-29 ENCOUNTER — Ambulatory Visit
Admission: RE | Admit: 2014-06-29 | Discharge: 2014-06-29 | Disposition: A | Discharge status: Home / Self Care | Payer: 59 | Source: Ambulatory Visit | Attending: Radiation Oncology | Admitting: Radiation Oncology

## 2014-06-29 DIAGNOSIS — Z510 Encounter for antineoplastic radiation therapy: Secondary | ICD-10-CM | POA: Diagnosis not present

## 2014-06-30 ENCOUNTER — Ambulatory Visit
Admission: RE | Admit: 2014-06-30 | Discharge: 2014-06-30 | Disposition: A | Discharge status: Home / Self Care | Payer: 59 | Source: Ambulatory Visit | Attending: Radiation Oncology | Admitting: Radiation Oncology

## 2014-06-30 DIAGNOSIS — Z510 Encounter for antineoplastic radiation therapy: Secondary | ICD-10-CM | POA: Diagnosis not present

## 2014-06-30 NOTE — Progress Notes (Signed)
Education today with Tamara Cross related her treatment for radiation to her right breast.  Information regarding pain, fatigue and skin changes in the treatment field such as redness, tanning, and dry/moist peeling of skin.  Reviewed the Radiation Therapy and You booklet with appropriate pages ear marked.  Given Radiaplex Gel with instructions to apply at least BID with the caveat that, if applied prior to treatment, ensure that this is done 4 hour before each treatment to prevent the potential of any increased redness/irritation of her breast, and she stated understanding.  Also instructed to perform a test patch x 4 days with the Alra deodorant to ensure she does not develop a rash, and again, she stated understanding.  Encouraged to relay any concerns any any given tinme during her treatment phase.  Te[employer].

## 2014-07-01 ENCOUNTER — Ambulatory Visit
Admission: RE | Admit: 2014-07-01 | Discharge: 2014-07-01 | Disposition: A | Discharge status: Home / Self Care | Payer: 59 | Source: Ambulatory Visit | Attending: Radiation Oncology | Admitting: Radiation Oncology

## 2014-07-01 DIAGNOSIS — Z510 Encounter for antineoplastic radiation therapy: Secondary | ICD-10-CM | POA: Diagnosis not present

## 2014-07-04 ENCOUNTER — Ambulatory Visit
Admission: RE | Admit: 2014-07-04 | Discharge: 2014-07-04 | Disposition: A | Discharge status: Home / Self Care | Payer: 59 | Source: Ambulatory Visit | Attending: Radiation Oncology | Admitting: Radiation Oncology

## 2014-07-04 VITALS — BP 124/75 | HR 78 | Temp 97.9°F | Resp 16 | Wt 196.4 lb

## 2014-07-04 DIAGNOSIS — C50411 Malignant neoplasm of upper-outer quadrant of right female breast: Secondary | ICD-10-CM

## 2014-07-04 DIAGNOSIS — Z510 Encounter for antineoplastic radiation therapy: Secondary | ICD-10-CM | POA: Diagnosis not present

## 2014-07-04 NOTE — Progress Notes (Signed)
She rates her pain as mild. Pt complains of skin sensitiveness and itching over the treatment field.   Pt right breast noted mild erythema under breast. Pt reports she continues to apply the Radiaplex bid.  Pt reports she is still continuing to wear an under wire bra.  Suggested discontinuing use of it and wear a soft cup bra instead.  The patient eats a regular, healthy diet.[name]

## 2014-07-04 NOTE — Progress Notes (Signed)
   Weekly Management Note:  Outpatient    ICD-9-CM ICD-10-CM  1. Malignant neoplasm of upper-outer quadrant of female breast, right 174.4 C50.411    Current Dose:  10 Gy  Projected Dose: 60 Gy   Narrative:  The patient presents for routine under treatment assessment.  CBCT/MVCT images/Port film x-rays were reviewed.  The chart was checked. Doing well.  Physical Findings:  weight is 196 lb 6.4 oz (89.086 kg). Her oral temperature is 97.9 F (36.6 C). Her blood pressure is 124/75 and her pulse is 78. Her respiration is 16 and oxygen saturation is 100%.  mild erythema of right breast  Impression:  The patient is tolerating radiotherapy.  Plan:  Continue radiotherapy as planned. Advised to avoid underwire bras  ________________________________   Eppie Gibson, M.D.

## 2014-07-05 ENCOUNTER — Ambulatory Visit
Admission: RE | Admit: 2014-07-05 | Discharge: 2014-07-05 | Disposition: A | Discharge status: Home / Self Care | Payer: 59 | Source: Ambulatory Visit | Attending: Radiation Oncology | Admitting: Radiation Oncology

## 2014-07-05 DIAGNOSIS — Z510 Encounter for antineoplastic radiation therapy: Secondary | ICD-10-CM | POA: Diagnosis not present

## 2014-07-06 ENCOUNTER — Ambulatory Visit
Admission: RE | Admit: 2014-07-06 | Discharge: 2014-07-06 | Disposition: A | Discharge status: Home / Self Care | Payer: 59 | Source: Ambulatory Visit | Attending: Radiation Oncology | Admitting: Radiation Oncology

## 2014-07-06 DIAGNOSIS — Z510 Encounter for antineoplastic radiation therapy: Secondary | ICD-10-CM | POA: Diagnosis not present

## 2014-07-07 ENCOUNTER — Ambulatory Visit
Admission: RE | Admit: 2014-07-07 | Discharge: 2014-07-07 | Disposition: A | Discharge status: Home / Self Care | Payer: 59 | Source: Ambulatory Visit | Attending: Radiation Oncology | Admitting: Radiation Oncology

## 2014-07-07 DIAGNOSIS — M549 Dorsalgia, unspecified: Secondary | ICD-10-CM | POA: Diagnosis not present

## 2014-07-07 DIAGNOSIS — N644 Mastodynia: Secondary | ICD-10-CM | POA: Insufficient documentation

## 2014-07-07 DIAGNOSIS — Z510 Encounter for antineoplastic radiation therapy: Secondary | ICD-10-CM | POA: Insufficient documentation

## 2014-07-07 DIAGNOSIS — C50411 Malignant neoplasm of upper-outer quadrant of right female breast: Secondary | ICD-10-CM | POA: Insufficient documentation

## 2014-07-08 ENCOUNTER — Ambulatory Visit
Admission: RE | Admit: 2014-07-08 | Discharge: 2014-07-08 | Disposition: A | Discharge status: Home / Self Care | Payer: 59 | Source: Ambulatory Visit | Attending: Radiation Oncology | Admitting: Radiation Oncology

## 2014-07-08 DIAGNOSIS — Z510 Encounter for antineoplastic radiation therapy: Secondary | ICD-10-CM | POA: Diagnosis not present

## 2014-07-11 ENCOUNTER — Ambulatory Visit
Admission: RE | Admit: 2014-07-11 | Discharge: 2014-07-11 | Disposition: A | Discharge status: Home / Self Care | Payer: 59 | Source: Ambulatory Visit | Attending: Radiation Oncology | Admitting: Radiation Oncology

## 2014-07-11 VITALS — BP 116/71 | HR 78 | Temp 98.4°F | Resp 12 | Wt 194.8 lb

## 2014-07-11 DIAGNOSIS — Z510 Encounter for antineoplastic radiation therapy: Secondary | ICD-10-CM | POA: Diagnosis not present

## 2014-07-11 DIAGNOSIS — C50411 Malignant neoplasm of upper-outer quadrant of right female breast: Secondary | ICD-10-CM

## 2014-07-11 DIAGNOSIS — D0591 Unspecified type of carcinoma in situ of right breast: Secondary | ICD-10-CM

## 2014-07-11 MED ORDER — BIAFINE EX EMUL
Freq: Two times a day (BID) | CUTANEOUS | Status: DC
  Administered 2014-07-11: 17:00:00 via TOPICAL

## 2014-07-11 NOTE — Addendum Note (Signed)
Encounter addended by: Jenene Slicker, RN on: 07/11/2014  5:11 PM<BR>     Documentation filed: Visit Diagnoses, Orders, Inpatient [facility]

## 2014-07-11 NOTE — Progress Notes (Signed)
   Weekly Management Note:  outpatient    ICD-9-CM ICD-10-CM  1. Malignant neoplasm of upper-outer quadrant of female breast, right 174.4 C50.411    Current Dose:20 Gy  Projected Dose: 60 Gy   Narrative:  The patient presents for routine under treatment assessment.  CBCT/MVCT images/Port film x-rays were reviewed.  The chart was checked. Constant aching pain in breast, esp nipple. No fevers/chill.  Some swelling of breast  Physical Findings:  weight is 194 lb 12.8 oz (88.361 kg). Her oral temperature is 98.4 F (36.9 C). Her blood pressure is 116/71 and her pulse is 78. Her respiration is 12 and oxygen saturation is 100%.  no excessive warmth, no fluctuance of R breast. Modest swelling of breast. Modest erythema.   Impression:  The patient is tolerating radiotherapy.  Plan:  Continue radiotherapy as planned. Biafine for skin. Hydrocortisone1% for itching.  Continue to follow.  Declines narcotics for pain. Try Tylenol.  Discuss Ibuprofen w/ GI doctor  (ulcer history).  ________________________________   Eppie Gibson, M.D.

## 2014-07-11 NOTE — Progress Notes (Signed)
She rates her pain as a 7 on a scale of 0-10, Pt reports it feels like "pliers pulling at nipple" pain is deep right breast not on the skin surface.  Constant pain that has developed and worsened over the last few days.  Reports that, "if she would open her nipple like a door the the DEEP tissue underneath is being pulled out through the nipple." The area of pain is coming from the right side of the right breast. "Nipple feels like its going to fall off." Pt complains of skin sensitiveness and itching, Chills, Fatigue, and Pain Occurs-Constantly and varies in intensity.  Pt right breast positive for breast tenderness, pruritus and rash under her right breast skin fold. Reports having headaches daily, over the bilateral temples.

## 2014-07-12 ENCOUNTER — Ambulatory Visit
Admission: RE | Admit: 2014-07-12 | Discharge: 2014-07-12 | Disposition: A | Discharge status: Home / Self Care | Payer: 59 | Source: Ambulatory Visit | Attending: Radiation Oncology | Admitting: Radiation Oncology

## 2014-07-12 ENCOUNTER — Encounter: Payer: Self-pay | Admitting: Radiation Oncology

## 2014-07-12 DIAGNOSIS — Z510 Encounter for antineoplastic radiation therapy: Secondary | ICD-10-CM | POA: Diagnosis not present

## 2014-07-13 ENCOUNTER — Ambulatory Visit
Admission: RE | Admit: 2014-07-13 | Discharge: 2014-07-13 | Disposition: A | Discharge status: Home / Self Care | Payer: 59 | Source: Ambulatory Visit | Attending: Radiation Oncology | Admitting: Radiation Oncology

## 2014-07-13 ENCOUNTER — Encounter: Payer: Self-pay | Admitting: *Deleted

## 2014-07-13 DIAGNOSIS — Z510 Encounter for antineoplastic radiation therapy: Secondary | ICD-10-CM | POA: Diagnosis not present

## 2014-07-13 DIAGNOSIS — C50411 Malignant neoplasm of upper-outer quadrant of right female breast: Secondary | ICD-10-CM

## 2014-07-13 MED ORDER — RADIAPLEXRX EX GEL
Freq: Once | CUTANEOUS | Status: AC
  Administered 2014-07-13: 17:00:00 via TOPICAL

## 2014-07-13 NOTE — Progress Notes (Addendum)
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  Note red- rash like appearance to the right, lower outer rib region below her inframmary fold. Also note thick application of Biafine lotion in the inframmary fold.  She staes that she does not feel Biafine is helping due to burning and itching, therefore, after examination by Dr. Isidore Moos, her skin care product was changed back to radiaplex gel. Since she admits to sweating "a lot" in the inframmary fold, she was instructed to use Hydrogel pads in this area, but if not using them, substitute gel pads with hydrophilic foam pads which will wick away moisture to decrease irritation and possibly peeling of skin.  She stated understanding.  Will assess her on tomorrow.

## 2014-07-14 ENCOUNTER — Ambulatory Visit
Admission: RE | Admit: 2014-07-14 | Discharge: 2014-07-14 | Disposition: A | Discharge status: Home / Self Care | Payer: 59 | Source: Ambulatory Visit | Attending: Radiation Oncology | Admitting: Radiation Oncology

## 2014-07-14 ENCOUNTER — Encounter: Payer: Self-pay | Admitting: Internal Medicine

## 2014-07-14 DIAGNOSIS — Z510 Encounter for antineoplastic radiation therapy: Secondary | ICD-10-CM | POA: Diagnosis not present

## 2014-07-15 ENCOUNTER — Ambulatory Visit
Admission: RE | Admit: 2014-07-15 | Discharge: 2014-07-15 | Disposition: A | Discharge status: Home / Self Care | Payer: 59 | Source: Ambulatory Visit | Attending: Radiation Oncology | Admitting: Radiation Oncology

## 2014-07-15 ENCOUNTER — Encounter: Payer: Self-pay | Admitting: *Deleted

## 2014-07-15 ENCOUNTER — Inpatient Hospital Stay
Admission: RE | Admit: 2014-07-15 | Discharge: 2014-07-15 | Disposition: A | Discharge status: Home / Self Care | Payer: Self-pay | Source: Ambulatory Visit | Attending: Radiation Oncology | Admitting: Radiation Oncology

## 2014-07-15 VITALS — BP 124/66 | HR 84 | Temp 98.6°F

## 2014-07-15 DIAGNOSIS — Z510 Encounter for antineoplastic radiation therapy: Secondary | ICD-10-CM | POA: Diagnosis not present

## 2014-07-15 DIAGNOSIS — R112 Nausea with vomiting, unspecified: Secondary | ICD-10-CM

## 2014-07-15 HISTORY — DX: Peptic ulcer, site unspecified, unspecified as acute or chronic, without hemorrhage or perforation: K27.9

## 2014-07-15 HISTORY — DX: Irritable bowel syndrome, unspecified: K58.9

## 2014-07-15 HISTORY — DX: Esophageal obstruction: K22.2

## 2014-07-15 MED ORDER — PROMETHAZINE HCL 25 MG PO TABS: 25.0000 mg | ORAL_TABLET | Freq: Four times a day (QID) | ORAL | Status: DC | PRN

## 2014-07-15 NOTE — Progress Notes (Addendum)
   Weekly Management Note:  outpatient  DCIS, R Breast   ICD-9-CM  ICD-10-CM   1.  Malignant neoplasm of upper-outer quadrant of female breast, right  174.4  C50.411     Current Dose:  28 Gy  Projected Dose: 60 Gy   Narrative:  The patient presents for routine under treatment assessment.  CBCT/MVCT images/Port film x-rays were reviewed.  The chart was checked. She is nauseous, with intermittent vomiting, and HA's similar to past migraines, with light/smell sensitivity.  Physical Findings:  temperature is 98.6 F (37 C). Her blood pressure is 124/66 and her pulse is 84.  NAD, non toxic appearing, ambulatory  Impression:  The patient is tolerating radiotherapy.sx c/w migraines - she has h/o these.  Plan:  Continue radiotherapy as planned. Phenergan Rx'd.  F/u with PCP.  ________________________________   Eppie Gibson, M.D.

## 2014-07-15 NOTE — Progress Notes (Signed)
Tamara Cross here today with c/o progressive nausea this week with one episode of vomiting today at work and had to leave.   She notes her sense of smell has changed.   She also reports a frontal headache.  Note pain in her right leg 2 days ago, but none today.  She does admit to a history of migraines, but does not have any medication as she has used in the past.  She denies any blurred vision nor ataxia at this time. She is allergic to Imitrex and her PC is dr. Alain Marion.  She is requesting an antiemetic today.

## 2014-07-18 ENCOUNTER — Encounter: Payer: Self-pay | Admitting: Radiation Oncology

## 2014-07-18 ENCOUNTER — Ambulatory Visit
Admission: RE | Admit: 2014-07-18 | Discharge: 2014-07-18 | Disposition: A | Discharge status: Home / Self Care | Payer: 59 | Source: Ambulatory Visit | Attending: Radiation Oncology | Admitting: Radiation Oncology

## 2014-07-18 ENCOUNTER — Inpatient Hospital Stay
Admission: RE | Admit: 2014-07-18 | Discharge: 2014-07-18 | Disposition: A | Discharge status: Home / Self Care | Payer: Self-pay | Source: Ambulatory Visit | Attending: Radiation Oncology | Admitting: Radiation Oncology

## 2014-07-18 VITALS — BP 114/75 | HR 100 | Temp 98.1°F | Ht 64.25 in | Wt 194.2 lb

## 2014-07-18 DIAGNOSIS — Z510 Encounter for antineoplastic radiation therapy: Secondary | ICD-10-CM | POA: Diagnosis not present

## 2014-07-18 DIAGNOSIS — C50411 Malignant neoplasm of upper-outer quadrant of right female breast: Secondary | ICD-10-CM

## 2014-07-18 NOTE — Progress Notes (Signed)
Weekly Management Note:  Site: Right breast Current Dose:  3000  cGy Projected Dose: 5000  cGy  Narrative: The patient is seen today for routine under treatment assessment. CBCT/MVCT images/port films were reviewed. The chart was reviewed.   She does have pruritus along her breast for which she uses hydrocortisone cream. She, her husband, and  her son had a brief viral gastroenteritis. She does have moderate fatigue. She missed work this past Thursday and Friday and a letter will be given to her to excuse her from missing work.  Physical Examination:  Filed Vitals:   07/18/14 1636  BP: 114/75  Pulse: 100  Temp: 98.1 F (36.7 C)  .  Weight: 194 lb 3.2 oz (88.089 kg). There is mild to moderate erythema along the right breast with no areas of desquamation. She has a papular radiation dermatitis along the upper inner quadrant/sun exposed skin.  Impression: Tolerating radiation therapy well. She may continue with hydrocortisone cream to areas of pruritus and Radioplex gel elsewhere. She'll be given a letter to excuse her from work October 8 and 9.  Plan: Continue radiation therapy as planned.

## 2014-07-18 NOTE — Progress Notes (Addendum)
Tamara Cross has received 15 fractions to her right breast.  Her main concern today is that she has experienced nausea and vomiting last pm along with her husband and is not sure what they were exposed to.  She is nauseated today as well and is obtaining relief with Phernergan.  She has redness and rash in the upper inner portion of her right breat and in the inframmary fold.  Minimal relief of itching with Hydrocortisone creme as ordered by Dr. Isidore Moos.

## 2014-07-19 ENCOUNTER — Ambulatory Visit
Admission: RE | Admit: 2014-07-19 | Discharge: 2014-07-19 | Disposition: A | Discharge status: Home / Self Care | Payer: 59 | Source: Ambulatory Visit | Attending: Radiation Oncology | Admitting: Radiation Oncology

## 2014-07-19 DIAGNOSIS — Z510 Encounter for antineoplastic radiation therapy: Secondary | ICD-10-CM | POA: Diagnosis not present

## 2014-07-20 ENCOUNTER — Ambulatory Visit
Admission: RE | Admit: 2014-07-20 | Discharge: 2014-07-20 | Disposition: A | Discharge status: Home / Self Care | Payer: 59 | Source: Ambulatory Visit | Attending: Radiation Oncology | Admitting: Radiation Oncology

## 2014-07-20 DIAGNOSIS — Z510 Encounter for antineoplastic radiation therapy: Secondary | ICD-10-CM | POA: Diagnosis not present

## 2014-07-21 ENCOUNTER — Ambulatory Visit
Admission: RE | Admit: 2014-07-21 | Discharge: 2014-07-21 | Disposition: A | Discharge status: Home / Self Care | Payer: 59 | Source: Ambulatory Visit | Attending: Radiation Oncology | Admitting: Radiation Oncology

## 2014-07-21 DIAGNOSIS — Z510 Encounter for antineoplastic radiation therapy: Secondary | ICD-10-CM | POA: Diagnosis not present

## 2014-07-22 ENCOUNTER — Ambulatory Visit
Admission: RE | Admit: 2014-07-22 | Discharge: 2014-07-22 | Disposition: A | Discharge status: Home / Self Care | Payer: 59 | Source: Ambulatory Visit | Attending: Radiation Oncology | Admitting: Radiation Oncology

## 2014-07-22 DIAGNOSIS — Z510 Encounter for antineoplastic radiation therapy: Secondary | ICD-10-CM | POA: Diagnosis not present

## 2014-07-22 NOTE — Progress Notes (Signed)
After being seen by me initially and seen by Dr. Isidore Moos.  She then approached me asking if it normal to feel so "overwhelmed," not being able to "handle all of this."  I asked her if she would like to see our social work team.  She said she would but not today.  Request for social work submitted by Biochemist, clinical.

## 2014-07-25 ENCOUNTER — Telehealth: Payer: Self-pay | Admitting: *Deleted

## 2014-07-25 ENCOUNTER — Ambulatory Visit
Admission: RE | Admit: 2014-07-25 | Discharge: 2014-07-25 | Disposition: A | Discharge status: Home / Self Care | Payer: 59 | Source: Ambulatory Visit | Attending: Radiation Oncology | Admitting: Radiation Oncology

## 2014-07-25 ENCOUNTER — Encounter: Payer: Self-pay | Admitting: Radiation Oncology

## 2014-07-25 VITALS — BP 128/77 | HR 68 | Temp 98.6°F | Ht 64.25 in | Wt 195.7 lb

## 2014-07-25 DIAGNOSIS — C50411 Malignant neoplasm of upper-outer quadrant of right female breast: Secondary | ICD-10-CM | POA: Diagnosis not present

## 2014-07-25 DIAGNOSIS — Z510 Encounter for antineoplastic radiation therapy: Secondary | ICD-10-CM | POA: Diagnosis not present

## 2014-07-25 DIAGNOSIS — D0592 Unspecified type of carcinoma in situ of left breast: Secondary | ICD-10-CM | POA: Diagnosis present

## 2014-07-25 MED ORDER — RADIAPLEXRX EX GEL
Freq: Once | CUTANEOUS | Status: AC
  Administered 2014-07-25: 17:00:00 via TOPICAL

## 2014-07-25 MED ORDER — BIAFINE EX EMUL
Freq: Two times a day (BID) | CUTANEOUS | Status: DC
  Administered 2014-07-25: 17:00:00 via TOPICAL

## 2014-07-25 NOTE — Progress Notes (Signed)
   Weekly Management Note:  outpatient  Right Breast 174.4 / C50.411  Current Dose:  40 Gy  Projected Dose: 60 Gy   Narrative:  The patient presents for routine under treatment assessment.  CBCT/MVCT images/Port film x-rays were reviewed.  The chart was checked. Itching of right breast. Tired, still working.  Declines excuse letter from work.    Physical Findings:  height is 5' 4.25" (1.632 m) and weight is 195 lb 11.2 oz (88.769 kg). Her temperature is 98.6 F (37 C). Her blood pressure is 128/77 and her pulse is 68.  NAD, bright erythema right breast, skin intact.  Impression:  The patient is tolerating radiotherapy.  Plan:  Continue radiotherapy as planned.  Use hydrocortisone 1% cream PRN itching   ________________________________   Eppie Gibson, M.D.

## 2014-07-25 NOTE — Telephone Encounter (Signed)
CALLED PATIENT TO ASK ABOUT COMING IN EARLIER TODAY PER DR. SQUIRE REQUEST, LVM FOR A RETURN CALL

## 2014-07-25 NOTE — Progress Notes (Addendum)
Tamara Cross has received 20 fractions to her right breast.  Note bright erythema with rash-like appearance in the upper, inner quadrant and in the inframmary fold.  Reports itching with temporary relief with Biafine, but she reports that Radiaplex sooths the skin on her breast.  Referred again to Social Work for counseling since she is expressing feeling overwhelmed regarding treatment and diagnosis.

## 2014-07-26 ENCOUNTER — Ambulatory Visit
Admission: RE | Admit: 2014-07-26 | Discharge: 2014-07-26 | Disposition: A | Discharge status: Home / Self Care | Payer: 59 | Source: Ambulatory Visit | Attending: Radiation Oncology | Admitting: Radiation Oncology

## 2014-07-26 DIAGNOSIS — Z510 Encounter for antineoplastic radiation therapy: Secondary | ICD-10-CM | POA: Diagnosis not present

## 2014-07-26 NOTE — Progress Notes (Signed)
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  She was given robaxin and a steroidal cream and was wondering if they were OK to take with radiation.  Advised her that they are OK to use as long as the steroid cream was not applied to the treatment area (right breast).  She is also requesting a note to excuse her from work today as she was not feeling well.

## 2014-07-27 ENCOUNTER — Ambulatory Visit
Admission: RE | Admit: 2014-07-27 | Discharge: 2014-07-27 | Disposition: A | Discharge status: Home / Self Care | Payer: 59 | Source: Ambulatory Visit | Attending: Radiation Oncology | Admitting: Radiation Oncology

## 2014-07-27 DIAGNOSIS — Z510 Encounter for antineoplastic radiation therapy: Secondary | ICD-10-CM | POA: Diagnosis not present

## 2014-07-28 ENCOUNTER — Ambulatory Visit
Admission: RE | Admit: 2014-07-28 | Discharge: 2014-07-28 | Disposition: A | Discharge status: Home / Self Care | Payer: 59 | Source: Ambulatory Visit | Attending: Radiation Oncology | Admitting: Radiation Oncology

## 2014-07-28 ENCOUNTER — Ambulatory Visit (HOSPITAL_BASED_OUTPATIENT_CLINIC_OR_DEPARTMENT_OTHER): Payer: 59 | Admitting: Hematology and Oncology

## 2014-07-28 ENCOUNTER — Other Ambulatory Visit: Payer: Self-pay

## 2014-07-28 VITALS — BP 133/76 | HR 69 | Temp 99.0°F | Resp 18 | Wt 194.0 lb

## 2014-07-28 DIAGNOSIS — Z510 Encounter for antineoplastic radiation therapy: Secondary | ICD-10-CM | POA: Diagnosis not present

## 2014-07-28 DIAGNOSIS — R350 Frequency of micturition: Secondary | ICD-10-CM

## 2014-07-28 DIAGNOSIS — D0502 Lobular carcinoma in situ of left breast: Secondary | ICD-10-CM

## 2014-07-28 DIAGNOSIS — D0590 Unspecified type of carcinoma in situ of unspecified breast: Secondary | ICD-10-CM

## 2014-07-28 MED ORDER — TAMOXIFEN CITRATE 20 MG PO TABS: 20.0000 mg | ORAL_TABLET | Freq: Every day | ORAL | Status: DC

## 2014-07-28 NOTE — Progress Notes (Signed)
Pt requested  [employer] for tamoxifen.  Order placed - receipt confirmed.

## 2014-07-28 NOTE — Progress Notes (Signed)
Patient Care Team: Cassandria Anger, MD as PCP - General (Internal Medicine) Olga Millers, MD as Consulting Physician (Obstetrics and Gynecology) Lafayette Dragon, MD as Consulting Physician (Gastroenterology) Alphonsa Overall, MD as Consulting Physician (General Surgery) Eppie Gibson, MD as Consulting Physician (Radiation Oncology) Rulon Eisenmenger, MD as Consulting Physician (Hematology and Oncology)  DIAGNOSIS: No matching staging information was found for the patient.  SUMMARY OF ONCOLOGIC HISTORY:   Malignant neoplasm of upper-outer quadrant of right female breast   03/28/2014 Initial Diagnosis Malignant neoplasm of upper-outer quadrant of female breast: Right breast DCIS, left breast LCIS/ALH   04/14/2014 Initial Biopsy Right breast core biopsy negative microcalcifications seen. Left breast 12:00 lobular neoplasia atypical lobular hyperplasia and LCIS: Left breast 9:00 fibroadenoma   05/02/2014 Surgery Lumpectomy right breast DCIS 1 cm margins clear: ER 100% PR 65% intermediate grade plus ALH; 3 lumpectomies in the left breast showing LCIS+ ALH    CHIEF COMPLIANT: Followup on radiation therapy to document adjuvant therapy options  INTERVAL HISTORY: Tamara Cross is a 48 year old Caucasian with above-mentioned history of right-sided DCIS and left sided LCIS/ALH. She is currently undergoing adjuvant radiation therapy and is expected to finish radiation of the end of this month. She is here today to discuss the risks and benefits of adjuvant tamoxifen.  REVIEW OF SYSTEMS:   Constitutional: Denies fevers, chills or abnormal weight loss Eyes: Denies blurriness of vision Ears, nose, mouth, throat, and face: Denies mucositis or sore throat Respiratory: Denies cough, dyspnea or wheezes Cardiovascular: Denies palpitation, chest discomfort or lower extremity swelling Gastrointestinal:  Denies nausea, heartburn or change in bowel habits Skin: Denies abnormal skin rashes Lymphatics: Denies new  lymphadenopathy or easy bruising Neurological:Denies numbness, tingling or new weaknesses Behavioral/Psych: Mood is stable, no new changes  Breast: Slight discomfort from the effects of radiation All other systems were reviewed with the patient and are negative.  I have reviewed the past medical history, past surgical history, social history and family history with the patient and they are unchanged from previous note.  ALLERGIES:  is allergic to imitrex; sulfonamide derivatives; reglan; zofran; and ondansetron.  MEDICATIONS:  Current Outpatient Prescriptions  Medication Sig Dispense Refill  . acetaminophen (TYLENOL) 500 MG tablet Take 1,000 mg by mouth daily as needed for mild pain.      . clonazePAM (KLONOPIN) 0.5 MG tablet Take 0.5 mg by mouth at bedtime as needed for anxiety.       [name] desvenlafaxine (PRISTIQ) 100 MG 24 hr tablet Take 100 mg by mouth daily.        [name] lamoTRIgine (LAMICTAL) 100 MG tablet Take 100-200 mg by mouth 2 (two) times daily. Takes 200mg  every morning and 100mg  every night at bedtime.      . metoprolol succinate (TOPROL-XL) 50 MG 24 hr tablet Take 1 tablet (50 mg total) by mouth daily with or  immediately following a  meal.  14 tablet  0  . promethazine (PHENERGAN) 25 MG tablet Take 1 tablet (25 mg total) by mouth every 6 (six) hours as needed for nausea or vomiting.  30 tablet  0  . tamoxifen (NOLVADEX) 20 MG tablet Take 1 tablet (20 mg total) by mouth daily.  90 tablet  3  . traZODone (DESYREL) 100 MG tablet Take 100 mg by mouth at bedtime.       . valACYclovir (VALTREX) 500 MG tablet Take 500 mg by mouth daily as needed (for breakouts).        No current facility-administered  medications for this visit.    PHYSICAL EXAMINATION: ECOG PERFORMANCE STATUS: 1 - Symptomatic but completely ambulatory  There were no vitals filed for this visit. There were no vitals filed for this visit.  GENERAL:alert, no distress and comfortable SKIN: skin color, texture, turgor  are normal, no rashes or significant lesions EYES: normal, Conjunctiva are pink and non-injected, sclera clear OROPHARYNX:no exudate, no erythema and lips, buccal mucosa, and tongue normal  NECK: supple, thyroid normal size, non-tender, without nodularity LYMPH:  no palpable lymphadenopathy in the cervical, axillary or inguinal LUNGS: clear to auscultation and percussion with normal breathing effort HEART: regular rate & rhythm and no murmurs and no lower extremity edema ABDOMEN:abdomen soft, non-tender and normal bowel sounds Musculoskeletal:no cyanosis of digits and no clubbing  NEURO: alert & oriented x 3 with fluent speech, no focal motor/sensory deficits  LABORATORY DATA:  I have reviewed the data as listed   Chemistry      Component Value Date/Time   NA 142 04/26/2014 1337   K 4.6 04/26/2014 1337   CL 104 04/26/2014 1337   CO2 28 04/26/2014 1337   BUN 11 04/26/2014 1337   CREATININE 0.59 04/26/2014 1337      Component Value Date/Time   CALCIUM 8.7 04/26/2014 1337   ALKPHOS 71 04/26/2014 1618   AST 19 04/26/2014 1618   ALT 15 04/26/2014 1618   BILITOT 0.5 04/26/2014 1618       Lab Results  Component Value Date   WBC 7.4 04/26/2014   HGB 13.4 04/26/2014   HCT 41.3 04/26/2014   MCV 94.3 04/26/2014   PLT 219 04/26/2014   NEUTROABS 3.4 08/23/2008     RADIOGRAPHIC STUDIES: I have personally reviewed the radiology reports and agreed with their findings. No results found.   ASSESSMENT & PLAN:  Malignant neoplasm of upper-outer quadrant of right female breast 1. 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  2. LCIS and atypical lobular hyperplasia in the lumpectomy specimen from the left breast Patient is currently undergoing radiation therapy and will be finishing treatment end of this month. Recommended he start adjuvant antiestrogen therapy with tamoxifen 20 mg daily for 5 years. I discussed this and benefits of tamoxifen including the risk of hot flashes,  myalgias, risk of endometrial hyperplasia/polyps, uterine cancer, risk of blood clots.  The patient works at [employer] and I encouraged her that she needs to get up and move every hour to prevent any risk of clotting in the legs. I wrote a prescription to that effect that she can show to her employer.  Return to clinic in January to followup on how she is tolerating tamoxifen.  increased frequency of urination: We will get a urinalysis culture and sensitivity.  No orders of the defined types were placed in this encounter.   The patient has a good understanding of the overall plan. she agrees with it. She will call with any problems that may develop before her next visit here.  I spent 20 minutes counseling the patient face to face. The total time spent in the appointment was 25 minutes and more than 50% was on counseling and review of test results    Rulon Eisenmenger, MD 07/28/2014 5:21 PM

## 2014-07-28 NOTE — Assessment & Plan Note (Signed)
1. 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  2. LCIS and atypical lobular hyperplasia in the lumpectomy specimen from the left breast Patient is currently undergoing radiation therapy and will be finishing treatment end of this month. Recommended he start adjuvant antiestrogen therapy with tamoxifen 20 mg daily for 5 years. I discussed this and benefits of tamoxifen including the risk of hot flashes, myalgias, risk of endometrial hyperplasia/polyps, uterine cancer, risk of blood clots.  The patient works at [employer] and I encouraged her that she needs to get up and move every hour to prevent any risk of clotting in the legs. I wrote a prescription to that effect that she can show to her employer.  Return to clinic in January to followup on how she is tolerating tamoxifen.

## 2014-07-28 NOTE — Addendum Note (Signed)
Addended by: Prentiss Bells on: 07/28/2014 06:07 PM   Modules accepted: Orders, Medications

## 2014-07-29 ENCOUNTER — Ambulatory Visit (INDEPENDENT_AMBULATORY_CARE_PROVIDER_SITE_OTHER): Payer: 59 | Admitting: Internal Medicine

## 2014-07-29 ENCOUNTER — Encounter: Payer: Self-pay | Admitting: Radiation Oncology

## 2014-07-29 ENCOUNTER — Ambulatory Visit
Admission: RE | Admit: 2014-07-29 | Discharge: 2014-07-29 | Disposition: A | Discharge status: Home / Self Care | Payer: 59 | Source: Ambulatory Visit | Attending: Radiation Oncology | Admitting: Radiation Oncology

## 2014-07-29 ENCOUNTER — Encounter: Payer: Self-pay | Admitting: Internal Medicine

## 2014-07-29 ENCOUNTER — Encounter: Payer: Self-pay | Admitting: *Deleted

## 2014-07-29 ENCOUNTER — Telehealth: Payer: Self-pay | Admitting: Hematology and Oncology

## 2014-07-29 ENCOUNTER — Ambulatory Visit (HOSPITAL_BASED_OUTPATIENT_CLINIC_OR_DEPARTMENT_OTHER): Payer: 59

## 2014-07-29 VITALS — BP 134/92 | HR 72 | Temp 98.0°F | Wt 194.4 lb

## 2014-07-29 DIAGNOSIS — M5442 Lumbago with sciatica, left side: Secondary | ICD-10-CM

## 2014-07-29 DIAGNOSIS — C50419 Malignant neoplasm of upper-outer quadrant of unspecified female breast: Secondary | ICD-10-CM

## 2014-07-29 DIAGNOSIS — Z510 Encounter for antineoplastic radiation therapy: Secondary | ICD-10-CM | POA: Diagnosis not present

## 2014-07-29 LAB — URINALYSIS, MICROSCOPIC - CHCC
Bilirubin (Urine): NEGATIVE
Blood: NEGATIVE
GLUCOSE UR CHCC: NEGATIVE mg/dL
KETONES: NEGATIVE mg/dL
LEUKOCYTE ESTERASE: NEGATIVE
Nitrite: NEGATIVE
Protein: NEGATIVE mg/dL
RBC / HPF: NEGATIVE (ref 0–2)
SPECIFIC GRAVITY, URINE: 1.015 (ref 1.003–1.035)
Urobilinogen, UR: 0.2 mg/dL (ref 0.2–1)
pH: 6.5 (ref 4.6–8.0)

## 2014-07-29 MED ORDER — OXYCODONE HCL 5 MG PO TABS: 5.0000 mg | ORAL_TABLET | Freq: Four times a day (QID) | ORAL | Status: DC | PRN

## 2014-07-29 MED ORDER — OXYCODONE HCL 5 MG PO TABA: ORAL_TABLET | ORAL | Status: DC

## 2014-07-29 NOTE — Telephone Encounter (Signed)
, °

## 2014-07-29 NOTE — Progress Notes (Signed)
[employer] Disability claim form put in orange folder and sent to RN for processing

## 2014-07-29 NOTE — Assessment & Plan Note (Signed)
With left sciatica, to cont all meds including predpac, also for oxycodone prn infreq and limited use, f/u Dr French Ana as planned in 1 - 2 wks

## 2014-07-29 NOTE — Progress Notes (Signed)
[facility] Psychosocial Distress Screening Clinical Social Work  Clinical Social Work was referred by distress screening protocol.  The patient scored a 8 on the Psychosocial Distress Thermometer which indicates severe distress. Clinical Social Worker met with pt after radiation to assess for distress and other psychosocial needs. Pt was limited in her time and ability to talk as she had her son with her and was headed to another appointment. CSW reviewed roles of the Patient and [facility] along with all of our programs. Pt is very interested in counseling and additional supports. She requests an Network engineer and CSW will make referral today. CSW encouraged pt to attend Breast Cancer Support Group as well. Pt plans to check her calendar and reach out to CSW for additional counseling and support.   ONCBCN DISTRESS SCREENING 07/28/2014  Screening Type   [place]ta [place] the number that describes how much distress you have been experiencing in the past week 8  Practical problem type   Family Problem type Partner;Children  Emotional problem type Depression;Nervousness/Anxiety;Adjusting to illness;Isolation/feeling alone;Adjusting to appearance changes  Physical Problem type Pain;Nausea/vomiting;Constipation/diarrhea;Changes in urination;Skin dry/itchy  Physician notified of physical symptoms   Other paranoia, family, money, work [phone]    Clinical Social Worker follow up needed: Yes.    If yes, follow up plan: See above Loren Racer, [place] Worker [place]  [facility] Phone: [phone] Fax: [phone]

## 2014-07-29 NOTE — Patient Instructions (Signed)
Please take all new medication as prescribed - the pain medication  Please continue all other medications as before  Please have the pharmacy call with any other refills you may need.  Please keep your appointments with your specialists as you may have planned - radiation therapy, adn Dr French Ana as planned in 2 wks

## 2014-07-29 NOTE — Progress Notes (Signed)
Subjective:    Patient ID: Tamara Cross, female    DOB: [birth year], 48 y.o.   MRN: [id number]  HPI  Here with c/o left sided pain - lower back, had recent dx left sciatia per Dr French Ana. Had pelvic and spine xrays 2 days ago per Dr French Ana, no worries about the spine or pelvis. Did tx with prednisone for ? Nerve pain, robaxin no help, only on 3rd day with prednisone but was expecting better by now, frustrated with pain. Moderate to severe, constant, with radiation to the lateral leg to above the knee. Worse with standing, but some improved with walking actually. Nothing else seems to make better or worse.  No bowel or bladder change, fever, wt loss,  gait change or falls. Also Has hx of breast ca, had UA done per medical oncology yest - negative.  Has XRT for 6 wks, appt later today, 8 days left.   Past Medical History  Diagnosis Date  . Allergy     chroni rhinitis  . GERD (gastroesophageal reflux disease)   . Peptic ulcer disease   . Heart murmur     MVP  . Migraine   . Arthritis   . 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 Complication of anesthesia     last 2 surgeries had some tachycardia  . Dysrhythmia     tachy occ  . Pneumonia     hx  . Depression   . Anxiety   . MVP (mitral valve prolapse)   . H/O hiatal hernia   . IBS (irritable bowel syndrome)   . Esophageal stricture    Past Surgical History  Procedure Laterality Date  . Cesarean section  2002  . Appendectomy    . Tonsillectomy    . Cystectomy      left breast,in 20's  . Breast lumpectomy    . Shoulder surgery Left     debridement ,bone spurs  . Dilation and curettage of uterus      x2  . Vag ablation  11  . Breast lumpectomy with needle localization Bilateral 05/02/2014    Procedure: BILATERIAL BREAST LUMPECTOMY WITH NEEDLE LOCALIZATION;  Surgeon: Shann Medal, MD;  Location: [facility];  Service: General;  Laterality: Bilateral;  . Colposcopy      reports that she  has been smoking Cigarettes.  She started smoking about 16 years ago. She has a 2.5 pack-year smoking history. She has never used smokeless tobacco. She reports that she drinks about 3 ounces of alcohol per week. She reports that she does not use illicit drugs. family history includes Colon cancer in her maternal grandmother and maternal uncle; Colon polyps in an other family member; Depression in her brother; Diabetes in her maternal grandfather; Heart disease in her father and maternal grandfather; Hypertension in her father. There is no history of Breast cancer. Allergies  Allergen Reactions  . Imitrex [Sumatriptan] Anaphylaxis  . Sulfonamide Derivatives Shortness Of Breath  . Reglan [Metoclopramide] Other (See Comments)    REACTION: Possible cause of SVT.  [name] Zofran [Ondansetron Hcl] Other (See Comments)    REACTION: Possible cause of SVT  . Ondansetron Palpitations   Current Outpatient Prescriptions on File Prior to Visit  Medication Sig Dispense Refill  . acetaminophen (TYLENOL) 500 MG tablet Take 1,000 mg by mouth daily as needed for mild pain.      . clonazePAM (KLONOPIN) 0.5 MG tablet Take 0.5 mg by mouth at  bedtime as needed for anxiety.       [name] desvenlafaxine (PRISTIQ) 100 MG 24 hr tablet Take 100 mg by mouth daily.        [name] lamoTRIgine (LAMICTAL) 100 MG tablet Take 100-200 mg by mouth 2 (two) times daily. Takes 200mg  every morning and 100mg  every night at bedtime.      . methocarbamol (ROBAXIN) 750 MG tablet       . metoprolol succinate (TOPROL-XL) 50 MG 24 hr tablet Take 1 tablet (50 mg total) by mouth daily with or  immediately following a  meal.  14 tablet  0  . predniSONE (STERAPRED UNI-PAK) 5 MG TABS tablet       . promethazine (PHENERGAN) 25 MG tablet Take 1 tablet (25 mg total) by mouth every 6 (six) hours as needed for nausea or vomiting.  30 tablet  0  . tamoxifen (NOLVADEX) 20 MG tablet Take 1 tablet (20 mg total) by mouth daily.  90 tablet  3  . traZODone (DESYREL) 100 MG  tablet Take 100 mg by mouth at bedtime.       . valACYclovir (VALTREX) 500 MG tablet Take 500 mg by mouth daily as needed (for breakouts).        No current facility-administered medications on file prior to visit.    Review of Systems All otherwise neg per pt     Objective:   Physical Exam BP 134/92  Pulse 72  Temp(Src) 98 F (36.7 C) (Oral)  Wt 194 lb 6 oz (88.168 kg)  SpO2 97% VS noted,  Constitutional: Pt appears well-developed, well-nourished.  HENT: Head: NCAT.  Right Ear: External ear normal.  Left Ear: External ear normal.  Eyes: . Pupils are equal, round, and reactive to light. Conjunctivae and EOM are normal Neck: Normal range of motion. Neck supple.  Cardiovascular: Normal rate and regular rhythm.   Pulmonary/Chest: Effort normal and breath sounds normal.  Abd:  Soft, NT, ND, + BS Spine mild tender lower l4-5-s1 area and left upper butticl area Neurological: Pt is alert. Not confused , motorsens/dtr intact, gait stable Skin: Skin is warm. No rash Psychiatric: Pt behavior is normal. No agitation.     Assessment & Plan:

## 2014-07-29 NOTE — Progress Notes (Signed)
Pre visit review using our clinic review tool, if applicable. No additional management support is needed unless otherwise documented below in the visit note. 

## 2014-07-30 LAB — URINE CULTURE

## 2014-08-01 ENCOUNTER — Ambulatory Visit: Admission: RE | Admit: 2014-08-01 | Payer: 59 | Source: Ambulatory Visit | Admitting: Radiation Oncology

## 2014-08-01 ENCOUNTER — Ambulatory Visit
Admission: RE | Admit: 2014-08-01 | Discharge: 2014-08-01 | Disposition: A | Discharge status: Home / Self Care | Payer: 59 | Source: Ambulatory Visit | Attending: Radiation Oncology | Admitting: Radiation Oncology

## 2014-08-01 VITALS — BP 120/73 | HR 71 | Temp 98.6°F | Resp 14 | Wt 193.1 lb

## 2014-08-01 DIAGNOSIS — C50411 Malignant neoplasm of upper-outer quadrant of right female breast: Secondary | ICD-10-CM | POA: Insufficient documentation

## 2014-08-01 DIAGNOSIS — Z510 Encounter for antineoplastic radiation therapy: Secondary | ICD-10-CM | POA: Diagnosis not present

## 2014-08-01 MED ORDER — SILVER SULFADIAZINE 1 % EX CREA
TOPICAL_CREAM | Freq: Two times a day (BID) | CUTANEOUS | Status: DC
  Administered 2014-08-01: 18:00:00 via TOPICAL

## 2014-08-01 MED ORDER — BIAFINE EX EMUL
Freq: Two times a day (BID) | CUTANEOUS | Status: DC
  Administered 2014-08-01: 18:00:00 via TOPICAL

## 2014-08-01 MED ORDER — OXYCODONE HCL 5 MG PO TABS: 5.0000 mg | ORAL_TABLET | Freq: Two times a day (BID) | ORAL | Status: DC | PRN

## 2014-08-01 MED ORDER — RADIAPLEXRX EX GEL
Freq: Once | CUTANEOUS | Status: AC
  Administered 2014-08-01: 18:00:00 via TOPICAL

## 2014-08-01 NOTE — Progress Notes (Signed)
She rates her pain as a 9 on a scale of 0-10. Pt complains of skin sensitiveness and itching, Fever, Chills, Loss of Sleep, Fatigue, Generalized Weakness, Pain  Stabbing, Throbbing and deep tissue pain and Pain Occurs  Intermittently.  Pt left breast positive for breast tenderness, pruritus and rash, no edema. Noted moist desquamation under left breast fold.  Pt reports applying biafine and radiaplex as directed.  The patient eats a regular, healthy diet.

## 2014-08-01 NOTE — Addendum Note (Signed)
Encounter addended by: Deirdre Evener, RN on: 08/01/2014  7:02 PM<BR>     Documentation filed: Inpatient MAR

## 2014-08-01 NOTE — Progress Notes (Signed)
   Weekly Management Note:  Outpatient    ICD-9-CM ICD-10-CM   1. Malignant neoplasm of upper-outer quadrant of right female breast 174.4 C50.411 hyaluronate sodium (RADIAPLEXRX) gel     topical emolient (BIAFINE) emulsion     silver sulfADIAZINE (SILVADENE) 1 % cream     oxyCODONE (OXY IR/ROXICODONE) 5 MG immediate release tablet  2. Malignant neoplasm of upper-outer quadrant of right female breastActive 174.4 C50.411 hyaluronate sodium (RADIAPLEXRX) gel     topical emolient (BIAFINE) emulsion     silver sulfADIAZINE (SILVADENE) 1 % cream     oxyCODONE (OXY IR/ROXICODONE) 5 MG immediate release tablet    Current Dose:  50 Gy  Projected Dose: 60 Gy   Narrative:  The patient presents for routine under treatment assessment.  CBCT/MVCT images/Port film x-rays were reviewed.  The chart was checked. Skin more irritated.  Very painful  Physical Findings:  weight is 193 lb 1.6 oz (87.59 kg). Her oral temperature is 98.6 F (37 C). Her blood pressure is 120/73 and her pulse is 71. Her respiration is 14 and oxygen saturation is 100%.  erythema and early moist desquamation at inframammary fold (right)  Impression:  The patient is tolerating radiotherapy.  Plan:  Continue radiotherapy as planned. She wants to try silvadene.  Jar provided to apply to inframammary fold.  Biafine, radiaplex elsewhere.  Additional oxycodone Rx'd for pain so she can supplement current Rx to take q 4hrs.    ________________________________   Eppie Gibson, M.D.

## 2014-08-01 NOTE — Addendum Note (Signed)
Encounter addended by: Jenene Slicker, RN on: 08/01/2014  5:39 PM<BR>     Documentation filed: Inpatient MAR

## 2014-08-02 ENCOUNTER — Ambulatory Visit
Admission: RE | Admit: 2014-08-02 | Discharge: 2014-08-02 | Disposition: A | Discharge status: Home / Self Care | Payer: 59 | Source: Ambulatory Visit | Attending: Radiation Oncology | Admitting: Radiation Oncology

## 2014-08-02 ENCOUNTER — Encounter: Payer: Self-pay | Admitting: Radiation Oncology

## 2014-08-02 ENCOUNTER — Other Ambulatory Visit: Payer: Self-pay | Admitting: Internal Medicine

## 2014-08-02 DIAGNOSIS — Z510 Encounter for antineoplastic radiation therapy: Secondary | ICD-10-CM | POA: Diagnosis not present

## 2014-08-02 NOTE — Progress Notes (Signed)
I noticed this morning that Tamara Cross has a sulfonamide allergy.  She reports that she had forgotten to alert nursing to this yesterday when they gave her a jar of Silvadene cream and asked about her allergies. I told the patient to stop using this cream (even though she has used it twice with no adverse effects.) She said she will stop using this.  -----------------------------------  Eppie Gibson, MD

## 2014-08-03 ENCOUNTER — Ambulatory Visit
Admission: RE | Admit: 2014-08-03 | Discharge: 2014-08-03 | Disposition: A | Discharge status: Home / Self Care | Payer: 59 | Source: Ambulatory Visit | Attending: Radiation Oncology | Admitting: Radiation Oncology

## 2014-08-03 ENCOUNTER — Other Ambulatory Visit: Payer: Self-pay | Admitting: *Deleted

## 2014-08-03 DIAGNOSIS — Z510 Encounter for antineoplastic radiation therapy: Secondary | ICD-10-CM | POA: Diagnosis not present

## 2014-08-03 NOTE — Progress Notes (Signed)
Simulation Verification Note Outpatient Breast boost 08-02-14 The patient was brought to the treatment unit and placed in the planned treatment position. The clinical setup was verified. Then port films were obtained and uploaded to the radiation oncology medical record software.  The treatment beams were carefully compared against the planned radiation fields. The position location and shape of the radiation fields was reviewed. They targeted volume of tissue appears to be appropriately covered by the radiation beams. Organs at risk appear to be excluded as planned.  Based on my personal review, I approved the simulation verification. The patient's treatment will proceed as planned.  -----------------------------------  Eppie Gibson, MD

## 2014-08-04 ENCOUNTER — Ambulatory Visit
Admission: RE | Admit: 2014-08-04 | Discharge: 2014-08-04 | Disposition: A | Discharge status: Home / Self Care | Payer: 59 | Source: Ambulatory Visit | Attending: Radiation Oncology | Admitting: Radiation Oncology

## 2014-08-04 DIAGNOSIS — Z510 Encounter for antineoplastic radiation therapy: Secondary | ICD-10-CM | POA: Diagnosis not present

## 2014-08-05 ENCOUNTER — Ambulatory Visit
Admission: RE | Admit: 2014-08-05 | Discharge: 2014-08-05 | Disposition: A | Discharge status: Home / Self Care | Payer: 59 | Source: Ambulatory Visit | Attending: Radiation Oncology | Admitting: Radiation Oncology

## 2014-08-05 DIAGNOSIS — Z510 Encounter for antineoplastic radiation therapy: Secondary | ICD-10-CM | POA: Diagnosis not present

## 2014-08-08 ENCOUNTER — Encounter: Payer: Self-pay | Admitting: Radiation Oncology

## 2014-08-08 ENCOUNTER — Inpatient Hospital Stay
Admission: RE | Admit: 2014-08-08 | Discharge: 2014-08-08 | Disposition: A | Discharge status: Home / Self Care | Payer: Self-pay | Source: Ambulatory Visit | Attending: Radiation Oncology | Admitting: Radiation Oncology

## 2014-08-08 ENCOUNTER — Ambulatory Visit
Admission: RE | Admit: 2014-08-08 | Discharge: 2014-08-08 | Disposition: A | Discharge status: Home / Self Care | Payer: 59 | Source: Ambulatory Visit | Attending: Radiation Oncology | Admitting: Radiation Oncology

## 2014-08-08 ENCOUNTER — Encounter: Payer: Self-pay | Admitting: Internal Medicine

## 2014-08-08 VITALS — BP 114/71 | HR 77 | Temp 98.4°F | Resp 24 | Wt 197.2 lb

## 2014-08-08 DIAGNOSIS — Z510 Encounter for antineoplastic radiation therapy: Secondary | ICD-10-CM | POA: Diagnosis not present

## 2014-08-08 DIAGNOSIS — R21 Rash and other nonspecific skin eruption: Secondary | ICD-10-CM

## 2014-08-08 DIAGNOSIS — C50411 Malignant neoplasm of upper-outer quadrant of right female breast: Secondary | ICD-10-CM

## 2014-08-08 NOTE — Progress Notes (Signed)
   Weekly Management Note  Outpatient  Right breast DCIS 174.4 / C50.411   Completed Radiotherapy. Total Dose: 60 Gy   Narrative:  The patient presents for routine under treatment assessment on last day of radiotherapy.  CBCT/MVCT images/Port film x-rays were reviewed.  The chart was checked. Taking oxycodone at times for sciatic and breast pain. New soft tissue mass appreciated days ago over right upper abdominal wall.  Still has one over thigh (left).  Physical Findings:  weight is 197 lb 3.2 oz (89.449 kg). Her oral temperature is 98.4 F (36.9 C). Her blood pressure is 114/71 and her pulse is 77. Her respiration is 24 and oxygen saturation is 98%.   Right breast - dry, erythematous skin. Soft tissue masses in left thigh, right upper abdomen (suspect lipomas).  Impression:  The patient has tolerated radiotherapy.  Plan:  Routine follow-up in one month.We discussed that any future narcotic refills need to be from MD managing her sciatica.  She will address soft tissue masses with Dr Lucia Gaskins, her surgeon (suspect lipomas).  ________________________________   Eppie Gibson, M.D.

## 2014-08-08 NOTE — Progress Notes (Signed)
She is currently in no pain. Pt complains of skin sensitiveness and itching, Fatigue, Generalized Weakness, Pain  "lightening bolts" and Pain Occurs  Intermittently- reports oxycodone is alleviating the pain significantly. Pt right breast  positive for dryness, pruritus and rash, mild erythema.  Noted dry desquamation under right breast fold and right axilla, nipple.  Pt reports continued use of Biafine, Radiaplex and neosporin. Pt reports she noted swelling over her right arm , mostly upper arm over the weekend. Pt reports she discovered a mass approximately size is slightly larger than a 1/2 dollar.  Location is approximately 8-9 inches distal to right axilla.  Pt reports she discovered the mass this weekend.  The patient eats a regular, healthy diet.

## 2014-08-11 ENCOUNTER — Encounter: Payer: Self-pay | Admitting: Radiation Oncology

## 2014-08-11 NOTE — Progress Notes (Signed)
11.5.15:  Faxed disability form to [employer] - fax [phone]

## 2014-08-11 NOTE — Progress Notes (Signed)
  Radiation Oncology         ([phone]) [phone] ________________________________  Name: Tamara Cross MRN: [id number]  Date: 08/08/2014  DOB: [birth year]  End of Treatment Note  Diagnosis:   DCIS, right breast, ER/PR +, intermediate grade (patient, of note, also has LCIS and ALH left breast)  Indication for treatment:  curative       Radiation treatment dates:   06/28/2014-08/08/2014  Site/dose:   1) Right Breast / 50 Gy in 25 fractions 2) Right Breast Boost / 10 Gy in 5 fractions  Beams/energy:  1) tangents, 3D conformal  / 10 MV 2) 2 field photons / 10 MV  Narrative: The patient tolerated radiation treatment relatively well with some breast pain.  We discussed that any future narcotic refills need to be from the MD that is managing her sciatica. She will address soft tissue masses over her abdomen and thigh with Dr Lucia Gaskins, her surgeon (suspect lipomas).  Plan: The patient has completed radiation treatment. The patient will return to radiation oncology clinic for routine followup in one month. I advised them to call or return sooner if they have any questions or concerns related to their recovery or treatment.  -----------------------------------  Eppie Gibson, MD

## 2014-08-22 ENCOUNTER — Telehealth: Payer: Self-pay

## 2014-08-22 NOTE — Telephone Encounter (Signed)
Returned pt call - confirmed radiation complete.  Let pt know she can start tamoxifen immediately.  Confirmed d/t follow up appt with Dr Lindi Adie.  Pt voiced understanding.

## 2014-08-29 ENCOUNTER — Telehealth: Payer: Self-pay | Admitting: *Deleted

## 2014-08-29 ENCOUNTER — Telehealth: Payer: Self-pay

## 2014-08-29 ENCOUNTER — Encounter: Payer: Self-pay | Admitting: Nurse Practitioner

## 2014-08-29 ENCOUNTER — Ambulatory Visit (HOSPITAL_BASED_OUTPATIENT_CLINIC_OR_DEPARTMENT_OTHER): Payer: 59

## 2014-08-29 ENCOUNTER — Ambulatory Visit (HOSPITAL_COMMUNITY)
Admission: RE | Admit: 2014-08-29 | Discharge: 2014-08-29 | Disposition: A | Discharge status: Home / Self Care | Payer: 59 | Source: Ambulatory Visit | Attending: Hematology and Oncology | Admitting: Hematology and Oncology

## 2014-08-29 ENCOUNTER — Ambulatory Visit (HOSPITAL_BASED_OUTPATIENT_CLINIC_OR_DEPARTMENT_OTHER): Payer: 59 | Admitting: Nurse Practitioner

## 2014-08-29 VITALS — BP 110/62 | HR 69 | Temp 98.2°F | Resp 18 | Ht 64.25 in | Wt 199.0 lb

## 2014-08-29 DIAGNOSIS — C50411 Malignant neoplasm of upper-outer quadrant of right female breast: Secondary | ICD-10-CM

## 2014-08-29 DIAGNOSIS — T451X5A Adverse effect of antineoplastic and immunosuppressive drugs, initial encounter: Secondary | ICD-10-CM

## 2014-08-29 DIAGNOSIS — M79606 Pain in leg, unspecified: Secondary | ICD-10-CM | POA: Insufficient documentation

## 2014-08-29 DIAGNOSIS — M79604 Pain in right leg: Secondary | ICD-10-CM

## 2014-08-29 DIAGNOSIS — M79609 Pain in unspecified limb: Secondary | ICD-10-CM

## 2014-08-29 DIAGNOSIS — N951 Menopausal and female climacteric states: Secondary | ICD-10-CM

## 2014-08-29 DIAGNOSIS — R609 Edema, unspecified: Secondary | ICD-10-CM

## 2014-08-29 DIAGNOSIS — R232 Flushing: Secondary | ICD-10-CM | POA: Insufficient documentation

## 2014-08-29 LAB — COMPREHENSIVE METABOLIC PANEL (CC13)
ALT: 15 U/L (ref 0–55)
ANION GAP: 10 meq/L (ref 3–11)
AST: 18 U/L (ref 5–34)
Albumin: 4 g/dL (ref 3.5–5.0)
Alkaline Phosphatase: 74 U/L (ref 40–150)
BILIRUBIN TOTAL: 0.48 mg/dL (ref 0.20–1.20)
BUN: 12.4 mg/dL (ref 7.0–26.0)
CHLORIDE: 106 meq/L (ref 98–109)
CO2: 28 meq/L (ref 22–29)
CREATININE: 0.7 mg/dL (ref 0.6–1.1)
Calcium: 9 mg/dL (ref 8.4–10.4)
Glucose: 119 mg/dl (ref 70–140)
Potassium: 4.5 mEq/L (ref 3.5–5.1)
Sodium: 144 mEq/L (ref 136–145)
Total Protein: 6.5 g/dL (ref 6.4–8.3)

## 2014-08-29 LAB — CBC WITH DIFFERENTIAL/PLATELET
BASO%: 1 % (ref 0.0–2.0)
BASOS ABS: 0.1 10*3/uL (ref 0.0–0.1)
EOS%: 1.9 % (ref 0.0–7.0)
Eosinophils Absolute: 0.1 10*3/uL (ref 0.0–0.5)
HEMATOCRIT: 41.3 % (ref 34.8–46.6)
HEMOGLOBIN: 13.4 g/dL (ref 11.6–15.9)
LYMPH#: 1.2 10*3/uL (ref 0.9–3.3)
LYMPH%: 21.3 % (ref 14.0–49.7)
MCH: 30.4 pg (ref 25.1–34.0)
MCHC: 32.5 g/dL (ref 31.5–36.0)
MCV: 93.5 fL (ref 79.5–101.0)
MONO#: 0.4 10*3/uL (ref 0.1–0.9)
MONO%: 7.2 % (ref 0.0–14.0)
NEUT#: 3.8 10*3/uL (ref 1.5–6.5)
NEUT%: 68.6 % (ref 38.4–76.8)
PLATELETS: 243 10*3/uL (ref 145–400)
RBC: 4.42 10*6/uL (ref 3.70–5.45)
RDW: 13.6 % (ref 11.2–14.5)
WBC: 5.6 10*3/uL (ref 3.9–10.3)

## 2014-08-29 NOTE — Assessment & Plan Note (Signed)
Right lower extremity Doppler US was negative for DVT today.  There is no evidence of erythema, warmth, red streaks, tenderness with palpation.  There is some trace edema to right calf area. All pulses are palpable and all extremities warm. Patient noted to have full ROM and ambulates with no difficulty.  Patient denies any chest pain, chest pressure, SOB, or pain with inspiration. No evidence of infection, trauma, or recent injury. Differential dx includes muscle strain and intermittent claudication.  Advised patient that would call her cardiologist Dr. Virl Axe to assist in arranging follow appt for as soon as possible.  Also, advised pt to return or go directly to Emergency Dept for any worsening symptoms whatsoever.

## 2014-08-29 NOTE — Assessment & Plan Note (Signed)
Patient initiated Tamoxifen therapy approximately 2 weeks ago; and has been experiencing some hot flashes daily. Patient states that they are tolerable at present. Hopefully, hot flashes will improve within 3 - 6 months.

## 2014-08-29 NOTE — Progress Notes (Signed)
Doppler negative.  Per Dr. Lindi Adie, bring pt in for labs and to see NP.  Per Sabino Gasser, ok to put pt on Cindy Bacon's schedule.  POF entered, labs entered.

## 2014-08-29 NOTE — Progress Notes (Signed)
will   SYMPTOM MANAGEMENT CLINIC   HPI: Tamara Cross 48 y.o. female diagnosed with breast cancer.  Patient is status post lumpectomy and radiation therapy.  Currently undergoing Tamoxifen therapy.   Patient called the cancer center today requesting an urgent care visit. She is c/o right calf pain for the past few days. She denies any known injury or trauma.  States that the right leg pain increases with walking.  She has also noted some right calf edema; but denies any specific tnederness with palpation. She denies any chest pain, chest pressure, SOB, or pain with inspiration. She denies any fever or chills.   HPI .  ROS  Past Medical History  Diagnosis Date  . Allergy     chroni rhinitis  . GERD (gastroesophageal reflux disease)   . Peptic ulcer disease   . Heart murmur     MVP  . Migraine   . Arthritis   . 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 Complication of anesthesia     last 2 surgeries had some tachycardia  . Dysrhythmia     tachy occ  . Pneumonia     hx  . Depression   . Anxiety   . MVP (mitral valve prolapse)   . H/O hiatal hernia   . IBS (irritable bowel syndrome)   . Esophageal stricture     Past Surgical History  Procedure Laterality Date  . Cesarean section  2002  . Appendectomy    . Tonsillectomy    . Cystectomy      left breast,in 20's  . Breast lumpectomy    . Shoulder surgery Left     debridement ,bone spurs  . Dilation and curettage of uterus      x2  . Vag ablation  11  . Breast lumpectomy with needle localization Bilateral 05/02/2014    Procedure: BILATERIAL BREAST LUMPECTOMY WITH NEEDLE LOCALIZATION;  Surgeon: Shann Medal, MD;  Location: [facility];  Service: General;  Laterality: Bilateral;  . Colposcopy      has OBESITY, MODERATE; ANXIETY; DEPRESSION; Blepharospasm; MIGRAINE HEADACHE; PREMATURE VENTRICULAR CONTRACTIONS; EXTERNAL HEMORRHOIDS; ALLERGIC RHINITIS; Reflux esophagitis; ESOPHAGEAL  STRICTURE; GERD; HIATAL HERNIA; Irritable bowel syndrome; HIP PAIN; POLYARTHRALGIA; PEDAL EDEMA; MITRAL VALVE PROLAPSE, HX OF; PUD, HX OF; IRRITABLE BOWEL SYNDROME, HX OF; Allergy; Heart murmur; Palpitations; Malignant neoplasm of upper-outer quadrant of right female breast; LBP (low back pain); Rash and nonspecific skin eruption; Leg pain; and Hot flashes due to tamoxifen on her problem list.     is allergic to imitrex; sulfonamide derivatives; reglan; zofran; and ondansetron.    Medication List       This list is accurate as of: 08/29/14  9:13 PM.  Always use your most recent med list.               acetaminophen 500 MG tablet  Commonly known as:  TYLENOL  Take 1,000 mg by mouth daily as needed for mild pain.     clonazePAM 0.5 MG tablet  Commonly known as:  KLONOPIN  Take 0.5 mg by mouth at bedtime as needed for anxiety.     desvenlafaxine 100 MG 24 hr tablet  Commonly known as:  PRISTIQ  Take 100 mg by mouth daily.     lamoTRIgine 100 MG tablet  Commonly known as:  LAMICTAL  Take 100-200 mg by mouth 2 (two) times daily. Takes 200mg  every morning and 100mg  every night at bedtime.  methocarbamol 750 MG tablet  Commonly known as:  ROBAXIN     metoprolol succinate 50 MG 24 hr tablet  Commonly known as:  TOPROL-XL  Take 1 tablet (50 mg total) by mouth daily with or  immediately following a  meal.     oxyCODONE 5 MG immediate release tablet  Commonly known as:  Oxy IR/ROXICODONE  Take 1 tablet (5 mg total) by mouth every 6 (six) hours as needed for severe pain.     oxyCODONE 5 MG immediate release tablet  Commonly known as:  Oxy IR/ROXICODONE  Take 1 tablet (5 mg total) by mouth 2 (two) times daily as needed for severe pain.     predniSONE 5 MG Tabs tablet  Commonly known as:  STERAPRED UNI-PAK     promethazine 25 MG tablet  Commonly known as:  PHENERGAN  Take 1 tablet (25 mg total) by mouth every 6 (six) hours as needed for nausea or vomiting.     tamoxifen 20 MG  tablet  Commonly known as:  NOLVADEX  Take 1 tablet (20 mg total) by mouth daily.     traZODone 100 MG tablet  Commonly known as:  DESYREL  Take 100 mg by mouth at bedtime.     valACYclovir 500 MG tablet  Commonly known as:  VALTREX  Take 500 mg by mouth daily as needed (for breakouts).         PHYSICAL EXAMINATION  Blood pressure 110/62, pulse 69, temperature 98.2 F (36.8 C), temperature source Oral, resp. rate 18, height 5' 4.25" (1.632 m), weight 199 lb (90.266 kg).  Physical Exam  Constitutional: She is oriented to person, place, and time and well-developed, well-nourished, and in no distress.  HENT:  Head: Normocephalic and atraumatic.  Eyes: Conjunctivae and EOM are normal. Pupils are equal, round, and reactive to light. No scleral icterus.  Neck: Normal range of motion.  Cardiovascular: Normal rate, regular rhythm, normal heart sounds and intact distal pulses.   Pulmonary/Chest: Effort normal and breath sounds normal. No respiratory distress. She has no wheezes. She has no rales.  Musculoskeletal: Normal range of motion. She exhibits edema. She exhibits no tenderness.  Right calf with trace edema; but no tenderness or evidence of infection. j  Neurological: She is alert and oriented to person, place, and time. Gait normal.  Skin: Skin is warm and dry. No rash noted. No erythema.  Psychiatric: Affect normal.  Nursing note and vitals reviewed.   LABORATORY DATA:. Appointment on 08/29/2014  Component Date Value Ref Range Status  . WBC 08/29/2014 5.6  3.9 - 10.3 10e3/uL Final  . NEUT# 08/29/2014 3.8  1.5 - 6.5 10e3/uL Final  . HGB 08/29/2014 13.4  11.6 - 15.9 g/dL Final  . HCT 08/29/2014 41.3  34.8 - 46.6 % Final  . Platelets 08/29/2014 243  145 - 400 10e3/uL Final  . MCV 08/29/2014 93.5  79.5 - 101.0 fL Final  . MCH 08/29/2014 30.4  25.1 - 34.0 pg Final  . MCHC 08/29/2014 32.5  31.5 - 36.0 g/dL Final  . RBC 08/29/2014 4.42  3.70 - 5.45 10e6/uL Final  . RDW  08/29/2014 13.6  11.2 - 14.5 % Final  . lymph# 08/29/2014 1.2  0.9 - 3.3 10e3/uL Final  . MONO# 08/29/2014 0.4  0.1 - 0.9 10e3/uL Final  . Eosinophils Absolute 08/29/2014 0.1  0.0 - 0.5 10e3/uL Final  . Basophils Absolute 08/29/2014 0.1  0.0 - 0.1 10e3/uL Final  . NEUT% 08/29/2014 68.6  38.4 - 76.8 %  Final  . LYMPH% 08/29/2014 21.3  14.0 - 49.7 % Final  . MONO% 08/29/2014 7.2  0.0 - 14.0 % Final  . EOS% 08/29/2014 1.9  0.0 - 7.0 % Final  . BASO% 08/29/2014 1.0  0.0 - 2.0 % Final  . Sodium 08/29/2014 144  136 - 145 mEq/L Final  . Potassium 08/29/2014 4.5  3.5 - 5.1 mEq/L Final  . Chloride 08/29/2014 106  98 - 109 mEq/L Final  . CO2 08/29/2014 28  22 - 29 mEq/L Final  . Glucose 08/29/2014 119  70 - 140 mg/dl Final  . BUN 08/29/2014 12.4  7.0 - 26.0 mg/dL Final  . Creatinine 08/29/2014 0.7  0.6 - 1.1 mg/dL Final  . Total Bilirubin 08/29/2014 0.48  0.20 - 1.20 mg/dL Final  . Alkaline Phosphatase 08/29/2014 74  40 - 150 U/L Final  . AST 08/29/2014 18  5 - 34 U/L Final  . ALT 08/29/2014 15  0 - 55 U/L Final  . Total Protein 08/29/2014 6.5  6.4 - 8.3 g/dL Final  . Albumin 08/29/2014 4.0  3.5 - 5.0 g/dL Final  . Calcium 08/29/2014 9.0  8.4 - 10.4 mg/dL Final  . Anion Gap 08/29/2014 10  3 - 11 mEq/L Final   Doppler US:          *[facility]*            [address], [place] [address]              [phone]  ------------------------------------------------------------------- Noninvasive Vascular Lab  Right Lower Extremity Venous Duplex Evaluation  Patient:  Estephanie, Hubbs MR #:    [id number] Study Date: 08/29/2014 Gender:   F Age:    63 Height: Weight: BSA: Pt. Status: Room:  SONOGRAPHER Sharion Dove, RVS REFERRING  Plotnikov, Alex ATTENDING  [place], Vinay K ORDERING   [place], Vinay K REFERRING  [place], [place]  K  Reports also to:  ------------------------------------------------------------------- History and indications:  Indications  729.5 Pain in limb. History  Diagnostic evaluation.  ------------------------------------------------------------------- Study information:  Study status: Routine. Procedure: A vascular evaluation was performed with the patient in the supine position. The right common femoral, right femoral, right greater saphenous, right lesser saphenous, right profunda femoral, right popliteal, right peroneal, right posterior tibial, and left common femoral veins were studied. Image quality was adequate.  Right lower extremity venous duplex evaluation.   Doppler flow study including B-mode compression maneuvers of all visualized segments, color flow Doppler and selected views of pulsed wave Doppler. Birthdate: Patient birthdate: 06/14/1966. Age: Patient is 48 yr old. Sex: Gender: female. Study date: Study date: 08/29/2014. Study time: 02:09 PM. Location: Vascular laboratory. Patient status: Outpatient.  Venous flow:  +--------------------------+-------+------------------------------+ Location         OverallFlow properties         +--------------------------+-------+------------------------------+ Right common femoral   Patent Phasic; spontaneous;                       compressible          +--------------------------+-------+------------------------------+ Right femoral       Patent Compressible          +--------------------------+-------+------------------------------+ Right profunda femoral  Patent Compressible          +--------------------------+-------+------------------------------+ Right popliteal      Patent Phasic; spontaneous;  compressible           +--------------------------+-------+------------------------------+ Right posterior tibial  Patent Compressible          +--------------------------+-------+------------------------------+ Right peroneal      Patent Compressible          +--------------------------+-------+------------------------------+ Right saphenofemoral   Patent Compressible          junction                             +--------------------------+-------+------------------------------+ Right greater saphenous  Patent Compressible          +--------------------------+-------+------------------------------+ Right lesser saphenous  Patent Compressible          +--------------------------+-------+------------------------------+ Left common femoral    Patent Phasic; spontaneous;                       compressible          +--------------------------+-------+------------------------------+  ------------------------------------------------------------------- Summary:  - No evidence of deep vein or superficial thrombosis involving the right lower extremity and left common femoral vein. - No evidence of Baker&'s cyst on the right.  Other specific details can be found in the table(s) above. Prepared and Electronically Authenticated by  Jessy Oto. Fields MD 2015-11-23T17:07:34    Specimen Collected: 08/29/14 2:09 PM Last Resulted: 08/29/14 5:07 PM                  Reviewed by List      RADIOGRAPHIC STUDIES: No results found.  ASSESSMENT/PLAN:    Hot flashes due to tamoxifen Patient initiated Tamoxifen therapy approximately 2 weeks ago; and has been experiencing some hot flashes daily. Patient states that they are tolerable at present. Hopefully, hot flashes will improve within 3 - 6 months.  Leg pain Right lower extremity Doppler US was negative for  DVT today.  There is no evidence of erythema, warmth, red streaks, tenderness with palpation.  There is some trace edema to right calf area. All pulses are palpable and all extremities warm. Patient noted to have full ROM and ambulates with no difficulty.  Patient denies any chest pain, chest pressure, SOB, or pain with inspiration. No evidence of infection, trauma, or recent injury. Differential dx includes muscle strain and intermittent claudication.  Advised patient that would call her cardiologist Dr. Virl Axe to assist in arranging follow appt for as soon as possible.  Also, advised pt to return or go directly to Emergency Dept for any worsening symptoms whatsoever.   Malignant neoplasm of upper-outer quadrant of right female breast Patient is status post right lumpectomy and radiation therapy. She initiated Tamoxifen therapy approx 2 weeks ago. She will continue with Tamoxifen for 5 years.   Patient will return 10/17/13 for a followup visit.   Patient stated understanding of all instructions; and was in agreement with this plan of care. The patient knows to call the clinic with any problems, questions or concerns.   Review/collaboration with Dr. Felix Pacini all aspects of patient's visit today.   Total time spent with patient was 25 minutes;  with greater than 80 percent of that time spent in face to face counseling regarding her symptoms, review of [place], and coordination of care and follow up.  Disclaimer: This note was dictated with voice recognition software. Similar sounding words can inadvertently be transcribed and may not be corrected upon review.   Drue Second, NP 08/29/2014

## 2014-08-29 NOTE — Progress Notes (Signed)
VASCULAR LAB PRELIMINARY  PRELIMINARY  PRELIMINARY  PRELIMINARY  Right lower extremity venous Doppler completed.    Preliminary report:  There is no DVT or SVT noted in the right lower extremity.   Sherah Lund, RVT 08/29/2014, 2:49 PM

## 2014-08-29 NOTE — Assessment & Plan Note (Signed)
Patient is status post right lumpectomy and radiation therapy. She initiated Tamoxifen therapy approx 2 weeks ago. She will continue with Tamoxifen for 5 years.   Patient will return 10/17/13 for a followup visit.

## 2014-08-29 NOTE — Telephone Encounter (Signed)
Pt left MyChart msg.  Called patient - reports pain woke her in the middle of the night Friday night - bad cramps.  Saturday morning woke with bad pain.  Sunday with pain.  Monday - still in pain - 7/10, sharp, constant. Swelling, denies change in color or temp.  Per Dr Lindi Adie, RLE doppler ordered.  Appt made for today 2 pm at [facility].  Pt notified and voiced understanding.

## 2014-08-29 NOTE — Telephone Encounter (Signed)
   Provider input needed: Right calf pain   Reason for call: right calf pain  Reports aching pain in right calf since Saturday   ALLERGIES:  is allergic to imitrex; sulfonamide derivatives; reglan; zofran; and ondansetron.  Patient last received chemotherapy/ treatment on Started Tamoxifen 2 weeks ago  Patient was last seen in the office on 07/28/14  Next appt is 10/17/2014  Is patient having fevers greater than 100.5?  no   Is patient having uncontrolled pain, or new pain? yes, aching right calf-increased with ambulation. Calf feels tight-no dramatic swelling or redness. Denies any cough, chest pain or dyspnea.   Is patient having new back pain that changes with position (worsens or eases when laying down?)  yes, increases with ambulation   Is patient able to eat and drink? yes    Is patient able to pass stool without difficulty?   N/a      Is patient having uncontrolled nausea?  no    Summary Based on the above information advised patient to  Await return call   [name]  08/29/2014, 8:58 AM   Background Info  Tamara Cross   DOB: Jun 30, 1966   MR#: [id number]   CSN#   [id number] 08/29/2014

## 2014-08-29 NOTE — Progress Notes (Signed)
Photon Boost Complex Emergency planning/management officer Note  Diagnosis: Breast Cancer  The patient's CT images from her free-breathing simulation were reviewed to plan her boost treatment to her righ breast  lumpectomy cavity.  The boost to the lumpectomy cavity will be delivered with 2 photon fields using MLCs for custom blocks again heart and lungs, with 10 MV photon energy.  This constitutes 2 complex treatment devices. Isodose plan was reviewed and approved. 10 Gy in 5 fractions prescribed.  -----------------------------------  Eppie Gibson, MD

## 2014-08-30 ENCOUNTER — Telehealth: Payer: Self-pay | Admitting: *Deleted

## 2014-08-30 NOTE — Telephone Encounter (Signed)
Selena Lesser NP notified of F/U call with patient.  Verbal instructions received and read back that a call will be made to Dr. Caryl Comes again tomorrow, we will provide a note for her being out of work and does she need pain medicines.   Called Ms. Hearn with this information and we will call her in reference to an appointment with Dr. Caryl Comes.

## 2014-08-30 NOTE — Telephone Encounter (Signed)
Called Tamara Cross for F/U.  Reports she is still having pain to her leg, the leg is still swollen, having headaches and was out of work today due to the pain and swelling.  Using oxycodone IR for pain and can't work.  Asked if Dr. Caryl Comes was reached today for her to have a stress test this week.  Also asked for a note to be out of work today and tomorrow.  Using short term and a note is required.  Will notify providers and she can be reached this evening at [phone].  If anything needs to be faxed to her it can be faxed to [phone].

## 2014-08-31 ENCOUNTER — Telehealth: Payer: Self-pay | Admitting: Internal Medicine

## 2014-08-31 ENCOUNTER — Encounter: Payer: Self-pay | Admitting: *Deleted

## 2014-08-31 NOTE — Progress Notes (Signed)
Placed pof and order for referral.  LMOVM - let pt know ltr faxed and order placed for referral to Dr Caryl Comes.  Requested pt call clinic if she has not heard on referral in next 3-4 days.

## 2014-08-31 NOTE — Telephone Encounter (Signed)
error 

## 2014-08-31 NOTE — Addendum Note (Signed)
Addended by: Prentiss Bells on: 08/31/2014 05:02 PM   Modules accepted: Orders

## 2014-08-31 NOTE — Progress Notes (Signed)
Spoke with patient. appt with dr Virl Axe is dec 1st @ 1:30. Faxed signed letter to excuse from work 11/24 and 11/25 to [phone]

## 2014-09-02 NOTE — Progress Notes (Signed)
Called pt and let her know fax failed 2x Wed.  Pt will pick letter up.  Ltr placed at front desk.  Pt voiced understanding.

## 2014-09-06 ENCOUNTER — Ambulatory Visit: Payer: 59 | Admitting: Internal Medicine

## 2014-09-06 ENCOUNTER — Other Ambulatory Visit: Payer: Self-pay | Admitting: *Deleted

## 2014-09-06 DIAGNOSIS — R600 Localized edema: Secondary | ICD-10-CM

## 2014-09-08 ENCOUNTER — Encounter: Payer: Self-pay | Admitting: Radiation Oncology

## 2014-09-08 ENCOUNTER — Ambulatory Visit: Payer: 59 | Admitting: Family

## 2014-09-09 ENCOUNTER — Encounter: Payer: Self-pay | Admitting: Radiation Oncology

## 2014-09-09 ENCOUNTER — Ambulatory Visit
Admission: RE | Admit: 2014-09-09 | Discharge: 2014-09-09 | Disposition: A | Discharge status: Home / Self Care | Payer: 59 | Source: Ambulatory Visit | Attending: Radiation Oncology | Admitting: Radiation Oncology

## 2014-09-09 VITALS — BP 126/73 | HR 76 | Temp 99.1°F | Resp 16 | Ht 64.25 in | Wt 196.6 lb

## 2014-09-09 DIAGNOSIS — C50411 Malignant neoplasm of upper-outer quadrant of right female breast: Secondary | ICD-10-CM

## 2014-09-09 HISTORY — DX: Personal history of irradiation: Z92.3

## 2014-09-09 NOTE — Progress Notes (Signed)
Radiation Oncology         [phone] ________________________________  Name: Tamara Cross MRN: [id number]  Date: 09/09/2014  DOB: Nov 28, 1965  Follow-Up Visit Note  Outpatient  CC: Walker Kehr, MD  Rulon Eisenmenger, MD  Diagnosis and Prior Radiotherapy:    ICD-9-CM ICD-10-CM   1. Malignant neoplasm of upper-outer quadrant of right female breast 174.4 C50.411     DCIS, right breast, ER/PR +, intermediate grade (patient, of note, also has LCIS and ALH left breast)  Indication for treatment: curative   Radiation treatment dates: 06/28/2014-08/08/2014  Site/dose: 1) Right Breast / 50 Gy in 25 fractions 2) Right Breast Boost / 10 Gy in 5 fractions  Narrative:  The patient returns today for routine follow-up. Doing well on Tamoxifen. Skin healed over right breast.  ALLERGIES:  is allergic to imitrex; sulfonamide derivatives; reglan; zofran; and ondansetron.  Meds: Current Outpatient Prescriptions  Medication Sig Dispense Refill  . acetaminophen (TYLENOL) 500 MG tablet Take 1,000 mg by mouth daily as needed for mild pain.    . clonazePAM (KLONOPIN) 0.5 MG tablet Take 0.5 mg by mouth at bedtime as needed for anxiety.     [name] desvenlafaxine (PRISTIQ) 100 MG 24 hr tablet Take 100 mg by mouth daily.      [name] lamoTRIgine (LAMICTAL) 100 MG tablet Take 100-200 mg by mouth 2 (two) times daily. Takes 200mg  every morning and 100mg  every night at bedtime.    . methocarbamol (ROBAXIN) 750 MG tablet     . metoprolol succinate (TOPROL-XL) 50 MG 24 hr tablet Take 1 tablet (50 mg total) by mouth daily with or  immediately following a  meal. 90 tablet 2  . oxyCODONE (OXY IR/ROXICODONE) 5 MG immediate release tablet Take 1 tablet (5 mg total) by mouth every 6 (six) hours as needed for severe pain. 60 tablet 0  . oxyCODONE (OXY IR/ROXICODONE) 5 MG immediate release tablet Take 1 tablet (5 mg total) by mouth 2 (two) times daily as needed for severe pain. 42 tablet 0  . tamoxifen (NOLVADEX)  20 MG tablet Take 1 tablet (20 mg total) by mouth daily. 90 tablet 3  . traZODone (DESYREL) 100 MG tablet Take 100 mg by mouth at bedtime.     . valACYclovir (VALTREX) 500 MG tablet Take 500 mg by mouth daily as needed (for breakouts).     [name] VYVANSE 40 MG capsule 40 mg daily.    . predniSONE (STERAPRED UNI-PAK) 5 MG TABS tablet     . promethazine (PHENERGAN) 25 MG tablet Take 1 tablet (25 mg total) by mouth every 6 (six) hours as needed for nausea or vomiting. 30 tablet 0   No current facility-administered medications for this encounter.    Physical Findings: The patient is in no acute distress. Patient is alert and oriented.  height is 5' 4.25" (1.632 m) and weight is 196 lb 9.6 oz (89.177 kg). Her oral temperature is 99.1 F (37.3 C). Her blood pressure is 126/73 and her pulse is 76. Her respiration is 16. .    Skin has healed nicely over right breast.  Lab Findings: Lab Results  Component Value Date   WBC 5.6 08/29/2014   HGB 13.4 08/29/2014   HCT 41.3 08/29/2014   MCV 93.5 08/29/2014   PLT 243 08/29/2014    Radiographic Findings: No results found.  Impression/Plan: doing well.  Apply vit E lotion over breast for maximal healing x 2 or more months.  I encouraged her to continue with  yearly mammography and followup with medical oncology. I will see her back on an as-needed basis. I have encouraged her to call if she has any issues or concerns in the future. I wished her the very best.   _____________________________________   Eppie Gibson, MD

## 2014-09-09 NOTE — Progress Notes (Signed)
Tamara Cross here for follow up after treatment to her right breast.  She denies pain in her breast except for occasional sharp twinges.  She reports a good appetite.  She is taking tamoxifen.  She reports that she has more energy in the mornings now but is fatigued in the afternoons.  The skin on her right breast is intact.

## 2014-09-12 ENCOUNTER — Ambulatory Visit: Payer: Self-pay

## 2014-09-12 ENCOUNTER — Encounter: Payer: Self-pay | Admitting: Internal Medicine

## 2014-09-12 ENCOUNTER — Ambulatory Visit (INDEPENDENT_AMBULATORY_CARE_PROVIDER_SITE_OTHER): Payer: 59 | Admitting: Internal Medicine

## 2014-09-12 ENCOUNTER — Other Ambulatory Visit: Payer: Self-pay | Admitting: Occupational Medicine

## 2014-09-12 VITALS — BP 132/80 | HR 68 | Temp 98.1°F | Resp 20 | Ht 64.25 in | Wt 193.8 lb

## 2014-09-12 DIAGNOSIS — F32A Depression, unspecified: Secondary | ICD-10-CM

## 2014-09-12 DIAGNOSIS — R52 Pain, unspecified: Secondary | ICD-10-CM

## 2014-09-12 DIAGNOSIS — F329 Major depressive disorder, single episode, unspecified: Secondary | ICD-10-CM

## 2014-09-12 DIAGNOSIS — E669 Obesity, unspecified: Secondary | ICD-10-CM

## 2014-09-12 DIAGNOSIS — D171 Benign lipomatous neoplasm of skin and subcutaneous tissue of trunk: Secondary | ICD-10-CM

## 2014-09-12 DIAGNOSIS — K589 Irritable bowel syndrome without diarrhea: Secondary | ICD-10-CM

## 2014-09-12 DIAGNOSIS — M2550 Pain in unspecified joint: Secondary | ICD-10-CM

## 2014-09-12 DIAGNOSIS — G43109 Migraine with aura, not intractable, without status migrainosus: Secondary | ICD-10-CM

## 2014-09-12 DIAGNOSIS — M79606 Pain in leg, unspecified: Secondary | ICD-10-CM

## 2014-09-12 DIAGNOSIS — M5440 Lumbago with sciatica, unspecified side: Secondary | ICD-10-CM

## 2014-09-12 MED ORDER — TRAMADOL HCL 50 MG PO TABS: 50.0000 mg | ORAL_TABLET | Freq: Two times a day (BID) | ORAL | Status: DC | PRN

## 2014-09-12 MED ORDER — VITAMIN D 1000 UNITS PO TABS: 1000.0000 [IU] | ORAL_TABLET | Freq: Every day | ORAL | Status: DC

## 2014-09-12 NOTE — Assessment & Plan Note (Signed)
12/15 R lower lat chest, grape size She will show it to Dr Lucia Gaskins

## 2014-09-12 NOTE — Assessment & Plan Note (Signed)
Discussed.

## 2014-09-12 NOTE — Assessment & Plan Note (Signed)
Discussed Stool softnet

## 2014-09-12 NOTE — Assessment & Plan Note (Signed)
Wt Readings from Last 3 Encounters:  09/12/14 193 lb 12 oz (87.884 kg)  09/09/14 196 lb 9.6 oz (89.177 kg)  08/29/14 199 lb (90.266 kg)

## 2014-09-12 NOTE — Assessment & Plan Note (Signed)
Will try Tramadol. D/c Oxycodone  Potential benefits of a long term opioids use as well as potential risks (i.e. addiction risk, apnea etc) and complications (i.e. Somnolence, constipation and others) were explained to the patient and were aknowledged.

## 2014-09-12 NOTE — Progress Notes (Signed)
Pre visit review using our clinic review tool, if applicable. No additional management support is needed unless otherwise documented below in the visit note. 

## 2014-09-12 NOTE — Assessment & Plan Note (Signed)
2015 ?Tamoxifen related Hold Tamoxifen x 1 week Take Vit D

## 2014-09-12 NOTE — Assessment & Plan Note (Signed)
Started 15 years ago Dr Georgiann Cocker Anxious depression

## 2014-09-12 NOTE — Progress Notes (Signed)
   Subjective:   C/o lump on the R chest C/o LBP and arthralgias - Oxycodone helps; stiffness   Back Pain This is a chronic problem. The problem has been waxing and waning since onset. The pain is present in the lumbar spine. The pain is moderate. Associated symptoms include headaches. Pertinent negatives include no abdominal pain, numbness or weakness. Risk factors include history of cancer. She has tried analgesics for the symptoms. The treatment provided mild relief.    Former Dr Linda Hedges pt   The patient is here to follow up on chronic anxiety, headaches and chronic IBS-c symptoms, recent dx of breast cancer (R) 5/15.   Wt Readings from Last 3 Encounters:  09/12/14 193 lb 12 oz (87.884 kg)  09/09/14 196 lb 9.6 oz (89.177 kg)  08/29/14 199 lb (90.266 kg)   BP Readings from Last 3 Encounters:  09/12/14 132/80  09/09/14 126/73  08/29/14 110/62      Review of Systems  Constitutional: Negative for chills, activity change, appetite change, fatigue and unexpected weight change.  HENT: Negative for congestion, ear pain, mouth sores, sinus pressure, sore throat and tinnitus.   Eyes: Negative for visual disturbance.  Respiratory: Negative for cough and chest tightness.   Cardiovascular: Negative for leg swelling.  Gastrointestinal: Positive for constipation. Negative for nausea, abdominal pain and diarrhea.  Genitourinary: Negative for urgency, frequency, flank pain, vaginal discharge, difficulty urinating and vaginal pain.  Musculoskeletal: Positive for back pain and neck pain. Negative for gait problem.  Skin: Negative for pallor, rash and wound.  Neurological: Positive for headaches. Negative for dizziness, tremors, syncope, weakness and numbness.  Hematological: Negative for adenopathy. Does not bruise/bleed easily.  Psychiatric/Behavioral: Negative for suicidal ideas, confusion, sleep disturbance, dysphoric mood and agitation. The patient is nervous/anxious.   A grape size  lipoma R lat chest - lower ribs R shoulder is tender w/ROM B hips are tender w/pressure over troch major  Wt Readings from Last 3 Encounters:  09/12/14 193 lb 12 oz (87.884 kg)  09/09/14 196 lb 9.6 oz (89.177 kg)  08/29/14 199 lb (90.266 kg)   BP Readings from Last 3 Encounters:  09/12/14 132/80  09/09/14 126/73  08/29/14 110/62        Objective:   Physical Exam  Lab Results  Component Value Date   WBC 5.6 08/29/2014   HGB 13.4 08/29/2014   HCT 41.3 08/29/2014   PLT 243 08/29/2014   GLUCOSE 119 08/29/2014   CHOL 178 08/23/2008   TRIG 47 08/23/2008   HDL 52.7 08/23/2008   LDLCALC 116* 08/23/2008   ALT 15 08/29/2014   AST 18 08/29/2014   NA 144 08/29/2014   K 4.5 08/29/2014   CL 104 04/26/2014   CREATININE 0.7 08/29/2014   BUN 12.4 08/29/2014   CO2 28 08/29/2014   TSH 2.57 04/26/2014         Assessment & Plan:

## 2014-09-14 ENCOUNTER — Other Ambulatory Visit (HOSPITAL_COMMUNITY): Payer: Self-pay | Admitting: Cardiology

## 2014-09-14 DIAGNOSIS — I739 Peripheral vascular disease, unspecified: Secondary | ICD-10-CM

## 2014-09-15 ENCOUNTER — Ambulatory Visit (HOSPITAL_COMMUNITY): Payer: 59 | Attending: Internal Medicine | Admitting: Cardiology

## 2014-09-15 DIAGNOSIS — I739 Peripheral vascular disease, unspecified: Secondary | ICD-10-CM | POA: Insufficient documentation

## 2014-09-15 NOTE — Progress Notes (Signed)
Lower arterial doppler performed. 

## 2014-09-16 ENCOUNTER — Encounter: Payer: Self-pay | Admitting: Internal Medicine

## 2014-09-16 ENCOUNTER — Ambulatory Visit (INDEPENDENT_AMBULATORY_CARE_PROVIDER_SITE_OTHER): Payer: 59 | Admitting: Internal Medicine

## 2014-09-16 VITALS — BP 116/72 | HR 72 | Ht 64.0 in | Wt 193.4 lb

## 2014-09-16 DIAGNOSIS — R1310 Dysphagia, unspecified: Secondary | ICD-10-CM

## 2014-09-16 DIAGNOSIS — Z1211 Encounter for screening for malignant neoplasm of colon: Secondary | ICD-10-CM

## 2014-09-16 MED ORDER — MOVIPREP 100 G PO SOLR: 1.0000 | Freq: Once | ORAL | Status: DC

## 2014-09-16 MED ORDER — PANTOPRAZOLE SODIUM 40 MG PO TBEC: 40.0000 mg | DELAYED_RELEASE_TABLET | Freq: Every day | ORAL | Status: DC

## 2014-09-16 NOTE — Patient Instructions (Signed)
You have been scheduled for an endoscopy and colonoscopy. Please follow the written instructions given to you at your visit today. Please pick up your prep at the pharmacy within the next 1-3 days. If you use inhalers (even only as needed), please bring them with you on the day of your procedure. Your physician has requested that you go to www.startemmi.com and enter the access code given to you at your visit today. This web site gives a general overview about your procedure. However, you should still follow specific instructions given to you by our office regarding your preparation for the procedure.  We have sent the following medications to your pharmacy for you to pick up at your convenience: Protonix 40 mg daily  CC:Dr Plotnikov

## 2014-09-16 NOTE — Progress Notes (Signed)
Tamara Cross 12/05/1965 [id number]  Note: This dictation was prepared with Dragon digital system. Any transcriptional errors that result from this procedure are unintentional.   History of Present Illness:  This is a 48-year-old white female with a recent diagnosis of DTIC breast carcinoma and lobular  Carcinoma. She is status post lumpectomy and radiation which was completed about a month ago. We saw her in the past for dysphagia and for colorectal screening. Her last upper endoscopy in January 2009 showed no evidence of stricture. She was dilated with [facility] dilator 52 French and 54 French. Her last colonoscopy was in April 2008 because of family history of colon cancer in maternal grandmother and her brother as well as in 2 maternal uncles and maternal aunts. She had no polyps on her last colonoscopy. Her last hemoglobin was 13.4. She takes stool softeners.    Past Medical History  Diagnosis Date  . Allergy     chroni rhinitis  . GERD (gastroesophageal reflux disease)   . Peptic ulcer disease   . Heart murmur     MVP  . Migraine   . Arthritis   . 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 Complication of anesthesia     last 2 surgeries had some tachycardia  . Dysrhythmia     tachy occ  . Pneumonia     hx  . Depression   . Anxiety   . MVP (mitral valve prolapse)   . H/O hiatal hernia   . IBS (irritable bowel syndrome)   . Esophageal stricture   . S/P radiation therapy  06/28/2014-08/08/2014     1) Right Breast / 50 Gy in 25 fractions/ 2) Right Breast Boost / 10 Gy in 5 fractions    Past Surgical History  Procedure Laterality Date  . Cesarean section  2002  . Appendectomy    . Tonsillectomy    . Cystectomy      left breast,in 20's  . Breast lumpectomy Bilateral   . Shoulder surgery Left     debridement ,bone spurs  . Dilation and curettage of uterus      x2  . Breast lumpectomy with needle localization Bilateral 05/02/2014    Procedure: BILATERIAL BREAST LUMPECTOMY WITH NEEDLE LOCALIZATION;  Surgeon: David H Newman, MD;  Location: [facility] OR;  Service: General;  Laterality: Bilateral;  . Colposcopy    . Novasure ablation    . Cervix lesion destruction      Allergies  Allergen Reactions  . Imitrex [Sumatriptan] Anaphylaxis  . Sulfonamide Derivatives Shortness Of Breath  . Reglan [Metoclopramide] Other (See Comments)    REACTION: Possible cause of SVT.  . Zofran [Ondansetron Hcl] Other (See Comments)    REACTION: Possible cause of SVT  . Ondansetron Palpitations    Family history and social history have been reviewed.  Review of Systems: Recurrent dysphagia.  The remainder of the 10 point ROS is negative except as outlined in the H&P  Physical Exam: General Appearance Well developed, in no distress overweight Eyes  Non icteric  HEENT  Non traumatic, normocephalic  Mouth No lesion, tongue papillated, no cheilosis Neck Supple without adenopathy, thyroid not enlarged, no carotid bruits, no JVD Lungs Clear to auscultation bilaterally COR Normal S1, normal S2, regular rhythm, no murmur, quiet precordium Abdomen epigastric tenderness. Mildly obese. Normoactive bowel sounds. Liver edge at costal margin. Mild diffuse tenderness across lower abdomen Rectal not done Extremities  No pedal edema Skin No lesions   Neurological Alert and oriented x 3 Psychological Normal mood and affect  Assessment and Plan:   Problem #1 48-year-old white female with history of gastroesophageal reflux with breakthrough symptoms and recurrent dysphagia. We will re-start Protonix 40 mg daily which seemed to helped in the past. We will also schedule her for an upper endoscopy and possible dilation which seemed to help last time.  Problem #2 Colorectal screening. Patient has received a recall letter. She has a strong family history of multiple indirect relatives. She never had a colon polyp. We will complete a colonoscopy at the same time  as upper endoscopy.    Tamara Cross 09/16/2014      

## 2014-10-05 NOTE — Interval H&P Note (Signed)
History and Physical Interval Note:  10/05/2014 10:22 PM  Tamara Cross  has presented today for surgery, with the diagnosis of dysphagia, screening colon  The various methods of treatment have been discussed with the patient and family. After consideration of risks, benefits and other options for treatment, the patient has consented to  Procedure(s): ESOPHAGOGASTRODUODENOSCOPY (EGD) (N/A) COLONOSCOPY (N/A) as a surgical intervention .  The patient's history has been reviewed, patient examined, no change in status, stable for surgery.  I have reviewed the patient's chart and labs.  Questions were answered to the patient's satisfaction.     Delfin Edis

## 2014-10-05 NOTE — H&P (View-Only) (Signed)
Tamara Cross 1966-09-12 [id number]  Note: This dictation was prepared with Dragon digital system. Any transcriptional errors that result from this procedure are unintentional.   History of Present Illness:  This is a 48 year old white female with a recent diagnosis of DTIC breast carcinoma and lobular  Carcinoma. She is status post lumpectomy and radiation which was completed about a month ago. We saw her in the past for dysphagia and for colorectal screening. Her last upper endoscopy in January 2009 showed no evidence of stricture. She was dilated with [facility] dilator 29 [place] and 9 [place]. Her last colonoscopy was in April 2008 because of family history of colon cancer in maternal grandmother and her brother as well as in 2 maternal uncles and maternal aunts. She had no polyps on her last colonoscopy. Her last hemoglobin was 13.4. She takes stool softeners.    Past Medical History  Diagnosis Date  . Allergy     chroni rhinitis  . GERD (gastroesophageal reflux disease)   . Peptic ulcer disease   . Heart murmur     MVP  . Migraine   . Arthritis   . 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 Complication of anesthesia     last 2 surgeries had some tachycardia  . Dysrhythmia     tachy occ  . Pneumonia     hx  . Depression   . Anxiety   . MVP (mitral valve prolapse)   . H/O hiatal hernia   . IBS (irritable bowel syndrome)   . Esophageal stricture   . S/P radiation therapy  06/28/2014-08/08/2014     1) Right Breast / 50 Gy in 25 fractions/ 2) Right Breast Boost / 10 Gy in 5 fractions    Past Surgical History  Procedure Laterality Date  . Cesarean section  2002  . Appendectomy    . Tonsillectomy    . Cystectomy      left breast,in 20's  . Breast lumpectomy Bilateral   . Shoulder surgery Left     debridement ,bone spurs  . Dilation and curettage of uterus      x2  . Breast lumpectomy with needle localization Bilateral 05/02/2014    Procedure: BILATERIAL BREAST LUMPECTOMY WITH NEEDLE LOCALIZATION;  Surgeon: Shann Medal, MD;  Location: [facility];  Service: General;  Laterality: Bilateral;  . Colposcopy    . Novasure ablation    . Cervix lesion destruction      Allergies  Allergen Reactions  . Imitrex [Sumatriptan] Anaphylaxis  . Sulfonamide Derivatives Shortness Of Breath  . Reglan [Metoclopramide] Other (See Comments)    REACTION: Possible cause of SVT.  [name] Zofran [Ondansetron Hcl] Other (See Comments)    REACTION: Possible cause of SVT  . Ondansetron Palpitations    Family history and social history have been reviewed.  Review of Systems: Recurrent dysphagia.  The remainder of the 10 point ROS is negative except as outlined in the H&P  Physical Exam: General Appearance Well developed, in no distress overweight Eyes  Non icteric  HEENT  Non traumatic, normocephalic  Mouth No lesion, tongue papillated, no cheilosis Neck Supple without adenopathy, thyroid not enlarged, no carotid bruits, no JVD Lungs Clear to auscultation bilaterally COR Normal S1, normal S2, regular rhythm, no murmur, quiet precordium Abdomen epigastric tenderness. Mildly obese. Normoactive bowel sounds. Liver edge at costal margin. Mild diffuse tenderness across lower abdomen Rectal not done Extremities  No pedal edema Skin No lesions  Neurological Alert and oriented x 3 Psychological Normal mood and affect  Assessment and Plan:   Problem #69 48 year old white female with history of gastroesophageal reflux with breakthrough symptoms and recurrent dysphagia. We will re-start Protonix 40 mg daily which seemed to helped in the past. We will also schedule her for an upper endoscopy and possible dilation which seemed to help last time.  Problem #2 Colorectal screening. Patient has received a recall letter. She has a strong family history of multiple indirect relatives. She never had a colon polyp. We will complete a colonoscopy at the same time  as upper endoscopy.    Delfin Edis 09/16/2014

## 2014-10-06 ENCOUNTER — Ambulatory Visit (HOSPITAL_COMMUNITY)
Admission: RE | Admit: 2014-10-06 | Discharge: 2014-10-06 | Disposition: A | Discharge status: Home / Self Care | Payer: 59 | Source: Ambulatory Visit | Attending: Internal Medicine | Admitting: Internal Medicine

## 2014-10-06 ENCOUNTER — Encounter (HOSPITAL_COMMUNITY): Payer: Self-pay

## 2014-10-06 ENCOUNTER — Encounter (HOSPITAL_COMMUNITY)
Admission: RE | Disposition: A | Discharge status: Home / Self Care | Payer: Self-pay | Source: Ambulatory Visit | Attending: Internal Medicine

## 2014-10-06 DIAGNOSIS — R1310 Dysphagia, unspecified: Secondary | ICD-10-CM | POA: Insufficient documentation

## 2014-10-06 DIAGNOSIS — Z1211 Encounter for screening for malignant neoplasm of colon: Secondary | ICD-10-CM | POA: Insufficient documentation

## 2014-10-06 DIAGNOSIS — K219 Gastro-esophageal reflux disease without esophagitis: Secondary | ICD-10-CM | POA: Diagnosis not present

## 2014-10-06 DIAGNOSIS — Z85038 Personal history of other malignant neoplasm of large intestine: Secondary | ICD-10-CM | POA: Insufficient documentation

## 2014-10-06 DIAGNOSIS — F418 Other specified anxiety disorders: Secondary | ICD-10-CM | POA: Insufficient documentation

## 2014-10-06 DIAGNOSIS — Z800 Family history of malignant neoplasm of digestive organs: Secondary | ICD-10-CM

## 2014-10-06 DIAGNOSIS — R1314 Dysphagia, pharyngoesophageal phase: Secondary | ICD-10-CM

## 2014-10-06 DIAGNOSIS — G43909 Migraine, unspecified, not intractable, without status migrainosus: Secondary | ICD-10-CM | POA: Insufficient documentation

## 2014-10-06 HISTORY — PX: ESOPHAGOGASTRODUODENOSCOPY: SHX5428

## 2014-10-06 HISTORY — PX: COLONOSCOPY: SHX5424

## 2014-10-06 SURGERY — EGD (ESOPHAGOGASTRODUODENOSCOPY)
Anesthesia: Moderate Sedation

## 2014-10-06 MED ORDER — DIPHENHYDRAMINE HCL 50 MG/ML IJ SOLN
INTRAMUSCULAR | Status: AC
  Filled 2014-10-06: qty 1

## 2014-10-06 MED ORDER — FENTANYL CITRATE 0.05 MG/ML IJ SOLN
INTRAMUSCULAR | Status: DC | PRN
  Administered 2014-10-06 (×6): 25 ug via INTRAVENOUS

## 2014-10-06 MED ORDER — MIDAZOLAM HCL 10 MG/2ML IJ SOLN
INTRAMUSCULAR | Status: AC
  Filled 2014-10-06: qty 2

## 2014-10-06 MED ORDER — FENTANYL CITRATE 0.05 MG/ML IJ SOLN
INTRAMUSCULAR | Status: AC
  Filled 2014-10-06: qty 2

## 2014-10-06 MED ORDER — BUTAMBEN-TETRACAINE-BENZOCAINE 2-2-14 % EX AERO
INHALATION_SPRAY | CUTANEOUS | Status: DC | PRN
  Administered 2014-10-06: 2 via TOPICAL

## 2014-10-06 MED ORDER — SODIUM CHLORIDE 0.9 % IV SOLN
INTRAVENOUS | Status: DC
  Administered 2014-10-06: 500 mL via INTRAVENOUS

## 2014-10-06 MED ORDER — MIDAZOLAM HCL 5 MG/5ML IJ SOLN
INTRAMUSCULAR | Status: DC | PRN
  Administered 2014-10-06: 2 mg via INTRAVENOUS
  Administered 2014-10-06 (×2): 1 mg via INTRAVENOUS
  Administered 2014-10-06: 2 mg via INTRAVENOUS
  Administered 2014-10-06: 1 mg via INTRAVENOUS
  Administered 2014-10-06: 2 mg via INTRAVENOUS
  Administered 2014-10-06: 1 mg via INTRAVENOUS

## 2014-10-06 NOTE — Op Note (Signed)
[facility] [place], [address]   COLONOSCOPY PROCEDURE REPORT  PATIENT: Tamara, Cross  MR#: [id number] BIRTHDATE: [birth year] , 48  yrs. old GENDER: female ENDOSCOPIST: Lafayette Dragon, MD REFERRED RD:EYCX Avel Sensor, M.D. PROCEDURE DATE:  10/06/2014 PROCEDURE:   Colonoscopy, screening First Screening Colonoscopy - Avg.  risk and is 50 yrs.  old or older - No.  Prior Negative Screening - Now for repeat screening. 10 or more years since last screening  History of Adenoma - Now for follow-up colonoscopy & has been > or = to 3 yrs.  N/A  Polyps Removed Today? No.  Polyps Removed Today? No.  Recommend repeat exam, <10 yrs? Polyps Removed Today? No.  Recommend repeat exam, <10 yrs? Yes.  Polyps Removed Today? No.  Recommend repeat exam, <10 yrs? Yes.  High risk (family or personal hx). ASA CLASS:   Class I INDICATIONS:history of colon cancer in multiple direct and indirect relatives specifically maternal grandmother, brother 2 maternal uncles and aunts.  Last colonoscopy April 2008 was normal. MEDICATIONS: Versed 2 mg IV and Fentanyl 75 mcg IV  DESCRIPTION OF PROCEDURE:   After the risks benefits and alternatives of the procedure were thoroughly explained, informed consent was obtained.  The digital rectal exam revealed no abnormalities of the rectum.   The Pentax Ped Colon [id number] endoscope was introduced through the anus and advanced to the cecum, which was identified by both the appendix and ileocecal valve. No adverse events experienced.   The quality of the prep was excellent, using MoviPrep  The instrument was then slowly withdrawn as the colon was fully examined.      COLON FINDINGS: A normal appearing cecum, ileocecal valve, and appendiceal orifice were identified.  The ascending, transverse, descending, sigmoid colon, and rectum appeared unremarkable. Retroflexed views revealed no abnormalities. The time to cecum=9 minutes 20 seconds.   Withdrawal time=6 minutes 15 seconds.  The scope was withdrawn and the procedure completed. COMPLICATIONS: There were no immediate complications.  ENDOSCOPIC IMPRESSION: Normal colonoscopy  RECOMMENDATIONS: High-fiber diet Recall colonoscopy in 5 years  eSigned:  Lafayette Dragon, MD 10/06/2014 9:11 AM   cc:   PATIENT NAME:  Tamara, Cross MR#: [id number]

## 2014-10-06 NOTE — Discharge Instructions (Signed)
Colonoscopy, Care After °These instructions give you information on caring for yourself after your procedure. Your doctor may also give you more specific instructions. Call your doctor if you have any problems or questions after your procedure. °HOME CARE °· Do not drive for 24 hours. °· Do not sign important papers or use machinery for 24 hours. °· You may shower. °· You may go back to your usual activities, but go slower for the first 24 hours. °· Take rest breaks often during the first 24 hours. °· Walk around or use warm packs on your belly (abdomen) if you have belly cramping or gas. °· Drink enough fluids to keep your pee (urine) clear or pale yellow. °· Resume your normal diet. Avoid heavy or fried foods. °· Avoid drinking alcohol for 24 hours or as told by your doctor. °· Only take medicines as told by your doctor. °If a tissue sample (biopsy) was taken during the procedure:  °· Do not take aspirin or blood thinners for 7 days, or as told by your doctor. °· Do not drink alcohol for 7 days, or as told by your doctor. °· Eat soft foods for the first 24 hours. °GET HELP IF: °You still have a small amount of blood in your poop (stool) 2-3 days after the procedure. °GET HELP RIGHT AWAY IF: °· You have more than a small amount of blood in your poop. °· You see clumps of tissue (blood clots) in your poop. °· Your belly is puffy (swollen). °· You feel sick to your stomach (nauseous) or throw up (vomit). °· You have a fever. °· You have belly pain that gets worse and medicine does not help. °MAKE SURE YOU: °· Understand these instructions. °· Will watch your condition. °· Will get help right away if you are not doing well or get worse. °Document Released: 10/26/2010 Document Revised: 09/28/2013 Document Reviewed: 05/31/2013 °ExitCare® Patient Information ©2015 [employer]. This information is not intended to replace advice given to you by your health care provider. Make sure you discuss any questions you have with  your health care provider. °Esophagogastroduodenoscopy °Care After °Refer to this sheet in the next few weeks. These instructions provide you with information on caring for yourself after your procedure. Your caregiver may also give you more specific instructions. Your treatment has been planned according to current medical practices, but problems sometimes occur. Call your caregiver if you have any problems or questions after your procedure.  °HOME CARE INSTRUCTIONS °· Do not eat or drink anything until the numbing medicine (local anesthetic) has worn off and your gag reflex has returned. You will know that the local anesthetic has worn off when you can swallow comfortably. °· Do not drive for 12 hours after the procedure or as directed by your caregiver. °· Only take medicines as directed by your caregiver. °SEEK MEDICAL CARE IF:  °· You cannot stop coughing. °· You are not urinating at all or less than usual. °SEEK IMMEDIATE MEDICAL CARE IF: °· You have difficulty swallowing. °· You cannot eat or drink. °· You have worsening throat or chest pain. °· You have dizziness, lightheadedness, or you faint. °· You have nausea or vomiting. °· You have chills. °· You have a fever. °· You have severe abdominal pain. °· You have black, tarry, or bloody stools. °Document Released: 09/09/2012 Document Reviewed: 09/09/2012 °ExitCare® Patient Information ©2015 [employer]. This information is not intended to replace advice given to you by your health care provider. Make sure you   discuss any questions you have with your health care provider. ° °

## 2014-10-06 NOTE — Op Note (Signed)
[facility] [place], [address]   ENDOSCOPY PROCEDURE REPORT  PATIENT: Tamara Cross, Tamara Cross  MR#: [id number] BIRTHDATE: May 11, 1966 , 48  yrs. old GENDER: female ENDOSCOPIST: Lafayette Dragon, MD REFERRED BY:  Creig Hines, M.D. PROCEDURE DATE:  10/06/2014 PROCEDURE:  EGD, diagnostic, Savary dilation of esophagus , and Maloney dilation of esophagus ASA CLASS:     Class II INDICATIONS:  dysphagia and prior upper endoscopy in January 2009, no evidence of stricture.  Patient dilated with 52 and 54 [place] Maloney dilators with complete relief of dysphagia. MEDICATIONS: Versed 7 mg IV and Fentanyl 75 mcg IV TOPICAL ANESTHETIC: Cetacaine Spray  DESCRIPTION OF PROCEDURE: After the risks benefits and alternatives of the procedure were thoroughly explained, informed consent was obtained.  The Pentax Gastroscope [id number] endoscope was introduced through the mouth and advanced to the second portion of the duodenum , Without limitations.  The instrument was slowly withdrawn as the mucosa was fully examined.    Esophagus: proximal, mid and distal esophageal mucosa appeared normal. There was no evidence of stricture. There was mild spasm in the distal esophagus. Endoscope traversed without resistance. Z line was irregular Stomach: gastric folds and gastric antrum were unremarkable. Retroflexion of the endoscope revealed normal fundus and cardia Duodenum: duodenal bulb and descending duodenum was normal Guidewire was placed into the stomach and Savary grade dilator 16 mm passed with some difficulty over a guidewire which appeared to be irregular. The guidewire was removed and the [facility] dilators were used 36 [place] and 63 [place] which easily passed through the esophagus,there  was small amount of blood on the dilator[ The scope was then withdrawn from the patient and the procedure completed.  COMPLICATIONS: There were no immediate complications.  ENDOSCOPIC  IMPRESSION: essentially normal upper endoscopy of esophagus stomach and duodenum No evidence of esophageal stricture. Status post passage of  52 F, 50 and 52 dilators  RECOMMENDATIONS: continue Protonix 40 mg daily Antireflux measures Repeated dilation when necessary    eSigned:  Lafayette Dragon, MD 10/06/2014 9:06 AM    CC:  PATIENT NAME:  Tamara Cross, Tamara Cross MR#: [id number]

## 2014-10-06 NOTE — Interval H&P Note (Signed)
History and Physical Interval Note:  10/06/2014 7:58 AM  Tamara Cross  has presented today for surgery, with the diagnosis of dysphagia, screening colon  The various methods of treatment have been discussed with the patient and family. After consideration of risks, benefits and other options for treatment, the patient has consented to  Procedure(s): ESOPHAGOGASTRODUODENOSCOPY (EGD) (N/A) COLONOSCOPY (N/A) as a surgical intervention .  The patient's history has been reviewed, patient examined, no change in status, stable for surgery.  I have reviewed the patient's chart and labs.  Questions were answered to the patient's satisfaction.     Delfin Edis

## 2014-10-07 ENCOUNTER — Encounter (HOSPITAL_COMMUNITY): Payer: Self-pay | Admitting: Internal Medicine

## 2014-10-17 ENCOUNTER — Telehealth: Payer: Self-pay | Admitting: Hematology and Oncology

## 2014-10-17 ENCOUNTER — Ambulatory Visit (HOSPITAL_BASED_OUTPATIENT_CLINIC_OR_DEPARTMENT_OTHER): Payer: 59 | Admitting: Hematology and Oncology

## 2014-10-17 VITALS — BP 137/71 | HR 72 | Temp 98.4°F | Resp 18 | Ht 64.0 in | Wt 189.4 lb

## 2014-10-17 DIAGNOSIS — D0512 Intraductal carcinoma in situ of left breast: Secondary | ICD-10-CM

## 2014-10-17 DIAGNOSIS — D0502 Lobular carcinoma in situ of left breast: Secondary | ICD-10-CM

## 2014-10-17 DIAGNOSIS — C50411 Malignant neoplasm of upper-outer quadrant of right female breast: Secondary | ICD-10-CM

## 2014-10-17 NOTE — Progress Notes (Signed)
Patient Care Team: Cassandria Anger, MD as PCP - General (Internal Medicine) Olga Millers, MD as Consulting Physician (Obstetrics and Gynecology) Lafayette Dragon, MD as Consulting Physician (Gastroenterology) Alphonsa Overall, MD as Consulting Physician (General Surgery) Eppie Gibson, MD as Consulting Physician (Radiation Oncology) Rulon Eisenmenger, MD as Consulting Physician (Hematology and Oncology)  DIAGNOSIS: No matching staging information was found for the patient.  SUMMARY OF ONCOLOGIC HISTORY:   Malignant neoplasm of upper-outer quadrant of right female breast   03/28/2014 Initial Diagnosis Malignant neoplasm of upper-outer quadrant of female breast: Right breast DCIS, left breast LCIS/ALH   04/14/2014 Initial Biopsy Right breast core biopsy negative microcalcifications seen. Left breast 12:00 lobular neoplasia atypical lobular hyperplasia and LCIS: Left breast 9:00 fibroadenoma   05/02/2014 Surgery Lumpectomy right breast DCIS 1 cm margins clear: ER 100% PR 65% intermediate grade plus ALH; 3 lumpectomies in the left breast showing LCIS+ [facility]   06/20/2014 - 08/04/2014 Radiation Therapy Adjuvant radiation therapy   09/16/2014 -  Anti-estrogen oral therapy Tamoxifen 20 mg daily plans for 5 years    CHIEF COMPLIANT: Follow-up on tamoxifen therapy  INTERVAL HISTORY: Tamara Cross is a 49 year old lady with above-mentioned history of right-sided DCIS treated with lumpectomy and is now on oral antiestrogen therapy after completion of radiation therapy. She has had multiple problems with tamoxifen including hot flashes and muscle aches and pains. She does not want to take any medication to help with the hot flashes. Other than that she is tolerating tamoxifen fairly well.  REVIEW OF SYSTEMS:   Constitutional: Denies fevers, chills or abnormal weight loss Eyes: Denies blurriness of vision Ears, nose, mouth, throat, and face: Denies mucositis or sore throat Respiratory: Denies cough, dyspnea or  wheezes Cardiovascular: Denies palpitation, chest discomfort or lower extremity swelling Gastrointestinal:  Denies nausea, heartburn or change in bowel habits Skin: Denies abnormal skin rashes Lymphatics: Denies new lymphadenopathy or easy bruising Neurological:Denies numbness, tingling or new weaknesses Behavioral/Psych: Mood is stable, no new changes  Breast:  denies any pain or lumps or nodules in either breasts All other systems were reviewed with the patient and are negative.  I have reviewed the past medical history, past surgical history, social history and family history with the patient and they are unchanged from previous note.  ALLERGIES:  is allergic to imitrex; sulfonamide derivatives; reglan; zofran; and ondansetron.  MEDICATIONS:  Current Outpatient Prescriptions  Medication Sig Dispense Refill  . acetaminophen (TYLENOL) 500 MG tablet Take 1,000 mg by mouth daily as needed for mild pain.    . clonazePAM (KLONOPIN) 0.5 MG tablet Take 0.5 mg by mouth at bedtime as needed for anxiety.     [name] desvenlafaxine (PRISTIQ) 100 MG 24 hr tablet Take 100 mg by mouth daily.      [name] lamoTRIgine (LAMICTAL) 100 MG tablet Take 100-200 mg by mouth 2 (two) times daily. Takes 200mg  every morning and 100mg  every night at bedtime.    . methocarbamol (ROBAXIN) 750 MG tablet     . metoprolol succinate (TOPROL-XL) 50 MG 24 hr tablet Take 1 tablet (50 mg total) by mouth daily with or  immediately following a  meal. 90 tablet 2  . oxycodone (OXY-IR) 5 MG capsule Take 5 mg by mouth 1 day or 1 dose.    . pantoprazole (PROTONIX) 40 MG tablet Take 1 tablet (40 mg total) by mouth daily. 30 tablet 1  . promethazine (PHENERGAN) 25 MG tablet Take 1 tablet (25 mg total) by mouth every 6 (  six) hours as needed for nausea or vomiting. 30 tablet 0  . tamoxifen (NOLVADEX) 20 MG tablet Take 1 tablet (20 mg total) by mouth daily. 90 tablet 3  . traMADol (ULTRAM) 50 MG tablet Take 1-2 tablets (50-100 mg total) by mouth 2  (two) times daily as needed. 100 tablet 3  . traZODone (DESYREL) 100 MG tablet Take 100 mg by mouth at bedtime.     . valACYclovir (VALTREX) 500 MG tablet Take 500 mg by mouth daily as needed (for breakouts).     [name] VYVANSE 40 MG capsule 40 mg daily.    [name] VYVANSE 50 MG capsule   0   No current facility-administered medications for this visit.    PHYSICAL EXAMINATION: ECOG PERFORMANCE STATUS: 1 - Symptomatic but completely ambulatory  Filed Vitals:   10/17/14 1526  BP: 137/71  Pulse: 72  Temp: 98.4 F (36.9 C)  Resp: 18   Filed Weights   10/17/14 1526  Weight: 189 lb 6.4 oz (85.911 kg)    GENERAL:alert, no distress and comfortable SKIN: skin color, texture, turgor are normal, no rashes or significant lesions EYES: normal, Conjunctiva are pink and non-injected, sclera clear OROPHARYNX:no exudate, no erythema and lips, buccal mucosa, and tongue normal  NECK: supple, thyroid normal size, non-tender, without nodularity LYMPH:  no palpable lymphadenopathy in the cervical, axillary or inguinal LUNGS: clear to auscultation and percussion with normal breathing effort HEART: regular rate & rhythm and no murmurs and no lower extremity edema ABDOMEN:abdomen soft, non-tender and normal bowel sounds Musculoskeletal:no cyanosis of digits and no clubbing  NEURO: alert & oriented x 3 with fluent speech, no focal motor/sensory deficits  LABORATORY DATA:  I have reviewed the data as listed   Chemistry      Component Value Date/Time   NA 144 08/29/2014 1557   NA 142 04/26/2014 1337   K 4.5 08/29/2014 1557   K 4.6 04/26/2014 1337   CL 104 04/26/2014 1337   CO2 28 08/29/2014 1557   CO2 28 04/26/2014 1337   BUN 12.4 08/29/2014 1557   BUN 11 04/26/2014 1337   CREATININE 0.7 08/29/2014 1557   CREATININE 0.59 04/26/2014 1337      Component Value Date/Time   CALCIUM 9.0 08/29/2014 1557   CALCIUM 8.7 04/26/2014 1337   ALKPHOS 74 08/29/2014 1557   ALKPHOS 71 04/26/2014 1618   AST 18  08/29/2014 1557   AST 19 04/26/2014 1618   ALT 15 08/29/2014 1557   ALT 15 04/26/2014 1618   BILITOT 0.48 08/29/2014 1557   BILITOT 0.5 04/26/2014 1618       Lab Results  Component Value Date   WBC 5.6 08/29/2014   HGB 13.4 08/29/2014   HCT 41.3 08/29/2014   MCV 93.5 08/29/2014   PLT 243 08/29/2014   NEUTROABS 3.8 08/29/2014   ASSESSMENT & PLAN:  Malignant neoplasm of upper-outer quadrant of right female breast 1. 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  2. LCIS and atypical lobular hyperplasia in the lumpectomy specimen from the left breast.  Tamoxifen toxicities: 1. Severe hot flashes: Patient wants to see if these get better over time 2. Muscle aches and pains and cramps  Breast cancer surveillance: Annual breast exams and mammograms. Return to clinic in 6 months for follow-up.   No orders of the defined types were placed in this encounter.   The patient has a good understanding of the overall plan. she agrees with it. She will call with any  problems that may develop before her next visit here.   Rulon Eisenmenger, MD 10/17/2014 3:45 PM

## 2014-10-17 NOTE — Assessment & Plan Note (Signed)
1. 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  2. LCIS and atypical lobular hyperplasia in the lumpectomy specimen from the left breast.  Tamoxifen toxicities: 1. Severe hot flashes: Patient wants to see if these get better over time 2. Muscle aches and pains and cramps  Breast cancer surveillance: Annual breast exams and mammograms. Return to clinic in 6 months for follow-up.

## 2014-10-17 NOTE — Telephone Encounter (Signed)
per pof to sch pt appt-gave pt copy of sch °

## 2014-11-04 ENCOUNTER — Telehealth: Payer: Self-pay | Admitting: *Deleted

## 2014-11-04 NOTE — Telephone Encounter (Signed)
BLEEDING STARTED TODAY. IT LESS THAN HER NORMAL MENSTRUAL CYCLE. SHE IS ON TAMOXIFEN. SPOKE TO [place], Lonell Grandchild. PT. TO CALL HER GYN FOR AN APPOINTMENT. NOTIFIED PT. SHE VOICES UNDERSTANDING.

## 2014-11-14 ENCOUNTER — Telehealth: Payer: Self-pay | Admitting: Internal Medicine

## 2014-11-15 ENCOUNTER — Encounter: Payer: Self-pay | Admitting: Physician Assistant

## 2014-11-15 ENCOUNTER — Other Ambulatory Visit (INDEPENDENT_AMBULATORY_CARE_PROVIDER_SITE_OTHER): Payer: 59

## 2014-11-15 ENCOUNTER — Ambulatory Visit (INDEPENDENT_AMBULATORY_CARE_PROVIDER_SITE_OTHER): Payer: 59 | Admitting: Physician Assistant

## 2014-11-15 VITALS — BP 100/78 | HR 68 | Temp 98.5°F | Ht 64.0 in | Wt 187.6 lb

## 2014-11-15 DIAGNOSIS — K529 Noninfective gastroenteritis and colitis, unspecified: Secondary | ICD-10-CM

## 2014-11-15 DIAGNOSIS — B349 Viral infection, unspecified: Secondary | ICD-10-CM

## 2014-11-15 DIAGNOSIS — R112 Nausea with vomiting, unspecified: Secondary | ICD-10-CM

## 2014-11-15 LAB — HEPATIC FUNCTION PANEL
ALT: 11 U/L (ref 0–35)
AST: 14 U/L (ref 0–37)
Albumin: 3.6 g/dL (ref 3.5–5.2)
Alkaline Phosphatase: 45 U/L (ref 39–117)
Bilirubin, Direct: 0.1 mg/dL (ref 0.0–0.3)
Total Bilirubin: 0.3 mg/dL (ref 0.2–1.2)
Total Protein: 5.9 g/dL — ABNORMAL LOW (ref 6.0–8.3)

## 2014-11-15 LAB — CBC WITH DIFFERENTIAL/PLATELET
BASOS PCT: 0.1 % (ref 0.0–3.0)
Basophils Absolute: 0 10*3/uL (ref 0.0–0.1)
EOS ABS: 0.1 10*3/uL (ref 0.0–0.7)
EOS PCT: 1.4 % (ref 0.0–5.0)
HEMATOCRIT: 41.2 % (ref 36.0–46.0)
Hemoglobin: 14.4 g/dL (ref 12.0–15.0)
Lymphocytes Relative: 16.3 % (ref 12.0–46.0)
Lymphs Abs: 0.8 10*3/uL (ref 0.7–4.0)
MCHC: 34.8 g/dL (ref 30.0–36.0)
MCV: 87.9 fl (ref 78.0–100.0)
MONO ABS: 0.4 10*3/uL (ref 0.1–1.0)
Monocytes Relative: 8.7 % (ref 3.0–12.0)
NEUTROS ABS: 3.6 10*3/uL (ref 1.4–7.7)
Neutrophils Relative %: 73.5 % (ref 43.0–77.0)
PLATELETS: 198 10*3/uL (ref 150.0–400.0)
RBC: 4.69 Mil/uL (ref 3.87–5.11)
RDW: 12.3 % (ref 11.5–15.5)
WBC: 4.8 10*3/uL (ref 4.0–10.5)

## 2014-11-15 LAB — BASIC METABOLIC PANEL
BUN: 11 mg/dL (ref 6–23)
CHLORIDE: 105 meq/L (ref 96–112)
CO2: 30 meq/L (ref 19–32)
CREATININE: 0.64 mg/dL (ref 0.40–1.20)
Calcium: 8.5 mg/dL (ref 8.4–10.5)
GFR: 104.1 mL/min (ref >60.00)
GLUCOSE: 119 mg/dL — AB (ref 70–99)
Potassium: 4.4 mEq/L (ref 3.5–5.1)
Sodium: 141 mEq/L (ref 135–145)

## 2014-11-15 LAB — AMYLASE: Amylase: 21 U/L — ABNORMAL LOW (ref 27–131)

## 2014-11-15 LAB — LIPASE: Lipase: 21 U/L (ref 11.0–59.0)

## 2014-11-15 MED ORDER — PROMETHAZINE HCL 25 MG PO TABS
25.0000 mg | ORAL_TABLET | Freq: Three times a day (TID) | ORAL | Status: DC | PRN
Start: 2014-11-15 — End: 2015-04-20

## 2014-11-15 NOTE — Progress Notes (Signed)
Patient ID: [name], female   DOB: [birth year], 49 y.o.   MRN: [id number]     History of Present Illness:      Tamara Cross is a 49 year old female who was known to Dr. Olevia Perches with irritable bowel syndrome and a remote history of peptic ulcer disease. She recently on 10/06/2014 had a colonoscopy that was normal and an EGD that was normal. At the time of the EGD she had an empiric savory dilation. She has been feeling well since that time until this past Sunday night when she began to experience crampy abdominal pain with nausea and diarrhea. She has had no vomiting. She states her diarrhea lasted for 12-14 hours and was explosive and watery with no blood. She has not had a bowel movement since yesterday but she has a lot of abdominal gurgling and gas and nausea. She has not been able to hold any solid foods down. She has been able to keep ginger ale down. She has no fever but has chills. She feels achy in her shoulders and back. She feels somewhat lightheaded but has no chest pain, palpitations, shortness of breath, or dyspnea. She has not had a flu shot.   Past Medical History  Diagnosis Date  . Allergy     chroni rhinitis  . GERD (gastroesophageal reflux disease)   . Peptic ulcer disease   . Heart murmur     MVP  . Migraine   . Arthritis   . 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 Complication of anesthesia     last 2 surgeries had some tachycardia  . Dysrhythmia     tachy occ  . Pneumonia     hx  . Depression   . Anxiety   . MVP (mitral valve prolapse)   . H/O hiatal hernia   . IBS (irritable bowel syndrome)   . Esophageal stricture   . S/P radiation therapy  06/28/2014-08/08/2014     1) Right Breast / 50 Gy in 25 fractions/ 2) Right Breast Boost / 10 Gy in 5 fractions    Past Surgical History  Procedure Laterality Date  . Cesarean section  2002  . Appendectomy    . Tonsillectomy    . Cystectomy      left breast,in 20's  . Breast  lumpectomy Bilateral   . Shoulder surgery Left     debridement ,bone spurs  . Dilation and curettage of uterus      x2  . Breast lumpectomy with needle localization Bilateral 05/02/2014    Procedure: BILATERIAL BREAST LUMPECTOMY WITH NEEDLE LOCALIZATION;  Surgeon: Shann Medal, MD;  Location: [facility];  Service: General;  Laterality: Bilateral;  . Colposcopy    . Novasure ablation    . Cervix lesion destruction    . Esophagogastroduodenoscopy N/A 10/06/2014    Procedure: ESOPHAGOGASTRODUODENOSCOPY (EGD);  Surgeon: Lafayette Dragon, MD;  Location: [facility];  Service: Endoscopy;  Laterality: N/A;  . Colonoscopy N/A 10/06/2014    Procedure: COLONOSCOPY;  Surgeon: Lafayette Dragon, MD;  Location: [facility];  Service: Endoscopy;  Laterality: N/A;   Family History  Problem Relation Age of Onset  . Heart disease Father   . Hypertension Father   . Colon cancer Maternal Grandmother   . Depression Brother   . Breast cancer Neg Hx   . Diabetes Maternal Grandfather   . Heart disease Maternal Grandfather   . Colon cancer Maternal Uncle  x 2  . Colon polyps      x 9 maternal uncles/aunts   History  Substance Use Topics  . Smoking status: Former Smoker -- 0.25 packs/day for 10 years    Types: Cigarettes    Start date: 02/23/1998  . Smokeless tobacco: Never Used  . Alcohol Use: 3.0 oz/week    5 Glasses of wine per week     Comment: socially   Current Outpatient Prescriptions  Medication Sig Dispense Refill  . acetaminophen (TYLENOL) 500 MG tablet Take 1,000 mg by mouth daily as needed for mild pain.    . clonazePAM (KLONOPIN) 0.5 MG tablet Take 0.5 mg by mouth at bedtime as needed for anxiety.     [name] desvenlafaxine (PRISTIQ) 100 MG 24 hr tablet Take 100 mg by mouth daily.      [name] lamoTRIgine (LAMICTAL) 100 MG tablet Take 100-200 mg by mouth 2 (two) times daily. Takes 200mg  every morning and 100mg  every night at bedtime.    . metoprolol succinate (TOPROL-XL) 50 MG 24 hr tablet Take 1  tablet (50 mg total) by mouth daily with or  immediately following a  meal. 90 tablet 2  . pantoprazole (PROTONIX) 40 MG tablet Take 1 tablet (40 mg total) by mouth daily. 30 tablet 1  . promethazine (PHENERGAN) 25 MG tablet Take 1 tablet (25 mg total) by mouth every 8 (eight) hours as needed for nausea or vomiting. 15 tablet 0  . tamoxifen (NOLVADEX) 20 MG tablet Take 1 tablet (20 mg total) by mouth daily. 90 tablet 3  . traZODone (DESYREL) 100 MG tablet Take 100 mg by mouth at bedtime.     . valACYclovir (VALTREX) 500 MG tablet Take 500 mg by mouth daily as needed (for breakouts).     [name] VYVANSE 40 MG capsule 40 mg daily.     No current facility-administered medications for this visit.   Allergies  Allergen Reactions  . Imitrex [Sumatriptan] Anaphylaxis  . Sulfonamide Derivatives Shortness Of Breath  . Reglan [Metoclopramide] Other (See Comments)    REACTION: Possible cause of SVT.  [name] Zofran [Ondansetron Hcl] Other (See Comments)    REACTION: Possible cause of SVT  . Ondansetron Palpitations      Review of Systems: Gen: Denies any fever, chills, sweats, anorexia, fatigue, weakness, malaise, weight loss, and sleep disorder CV: Denies chest pain, angina, palpitations, syncope, orthopnea, PND, peripheral edema, and claudication. Resp: Denies dyspnea at rest, dyspnea with exercise, cough, sputum, wheezing, coughing up blood, and pleurisy. GI: Denies vomiting blood, jaundice, and fecal incontinence.   Denies dysphagia or odynophagia. GU : Denies urinary burning, blood in urine, urinary frequency, urinary hesitancy, nocturnal urination, and urinary incontinence. MS: Denies joint pain, limitation of movement, and swelling, stiffness, low back pain, extremity pain. Denies muscle weakness, cramps, atrophy.  Derm: Denies rash, itching, dry skin, hives, moles, warts, or unhealing ulcers.  Psych: Denies depression, anxiety, memory loss, suicidal ideation, hallucinations, paranoia, and  confusion. Heme: Denies bruising, bleeding, and enlarged lymph nodes. Neuro:  Denies any headaches, dizziness, paresthesia Endo:  Denies any problems with DM, thyroid, adrenal    Physical Exam: General: Pleasant, well developed , female in no acute distress Head: Normocephalic and atraumatic Eyes:  sclerae anicteric, conjunctiva pink , PERLA, EOMINo nystagmus Ears: Normal auditory acuity TMs clear, no fluid Lungs: Clear throughout to auscultation Heart: Regular rate and rhythm Abdomen: Soft, non distended, mild diffuse tenderness with no rebound or guarding,no CVAT. No masses, no hepatomegaly. Normal bowel sounds Musculoskeletal: Symmetrical  with no gross deformities  Extremities: No edema  Neurological: Alert oriented x 4, grossly nonfocal Psychological:  Alert and cooperative. Normal mood and affect  Assessment and Recommendations:  49 year old female with a 2 day history of nausea, cramping, and diarrhea here for evaluation. Her diarrhea has subsided but she continues to have some cramping and nausea. She is passing gas. Her symptoms are suggestive of a viral gastroenteritis. A urine dip will be obtained to assess for specific gravity and if she is dry she is willing to try pushing by mouth fluids along with by mouth use of promethazine. A prescription for promethazine 25 mg every 8 hours when necessary nausea will be sent as the patient states she is all out of promethazine. A CBC B Matte hepatic function panel amylase and lipase will be obtained as well. Patient has been advised to adhere to a clear liquid diet for 24 hours, then a brat diet for 48 hours, then a low residue low-fat diet for 3 days. She has been instructed to call us if she is not starting to feel better in a few days or if her symptoms get worse.     Tamara Cross, Vita Barley PA-C 11/15/2014,

## 2014-11-15 NOTE — Telephone Encounter (Signed)
Left a message for patient to call back. 

## 2014-11-15 NOTE — Patient Instructions (Addendum)
Your physician has requested that you go to the basement for the following lab work before leaving today: CBC, BMET, Amylase, Lipase,Hepatic panel, U/A  Please follow a clear liquid diet x 24 hours, then advance to a "BRAT" diet (bananas, rice, applesauce, toast) x 48 hours. Then, stay on a low residue, low fat diet x 4 days.  Make certain to drink plenty of fluids!!!  We have sent the following medications to your pharmacy for you to pick up at your convenience: Promethazine every 8 hours as needed for nausea/vomiting  CC:Dr Plotnikov  Clear Liquid Diet A clear liquid diet is a short-term diet that is prescribed to provide the necessary fluid and basic energy you need when you can have nothing else. The clear liquid diet consists of liquids or solids that will become liquid at room temperature. You should be able to see through the liquid. There are many reasons that you may be restricted to clear liquids, such as:  When you have a sudden-onset (acute) condition that occurs before or after surgery.  To help your body slowly get adjusted to food again after a long period when you were unable to have food.  Replacement of fluids when you have a diarrheal disease.  When you are going to have certain exams, such as a colonoscopy, in which instruments are inserted inside your body to look at parts of your digestive system. WHAT CAN I HAVE? A clear liquid diet does not provide all the nutrients you need. It is important to choose a variety of the following items to get as many nutrients as possible:  Vegetable juices that do not have pulp.  Fruit juices and fruit drinks that do not have pulp.  Coffee (regular or decaffeinated), tea, or soda at the discretion of your health care provider.  Clear bouillon, broth, or strained broth-based soups.  High-protein and flavored gelatins.  Sugar or honey.  Ices or frozen ice pops that do not contain milk. If you are not sure whether you can  have certain items, you should ask your health care provider. You may also ask your health care provider if there are any other clear liquid options. Document Released: 09/23/2005 Document Revised: 09/28/2013 Document Reviewed: 08/20/2013 [facility] Patient Information 2015 [place], [place]. This information is not intended to replace advice given to you by your health care provider. Make sure you discuss any questions you have with your health care provider.  Food Choices to Help Relieve Diarrhea When you have diarrhea, the foods you eat and your eating habits are very important. Choosing the right foods and drinks can help relieve diarrhea. Also, because diarrhea can last up to 7 days, you need to replace lost fluids and electrolytes (such as sodium, potassium, and chloride) in order to help prevent dehydration.  WHAT GENERAL GUIDELINES DO I NEED TO FOLLOW?  Slowly drink 1 cup (8 oz) of fluid for each episode of diarrhea. If you are getting enough fluid, your urine will be clear or pale yellow.  Eat starchy foods. Some good choices include white rice, white toast, pasta, low-fiber cereal, baked potatoes (without the skin), saltine crackers, and bagels.  Avoid large servings of any cooked vegetables.  Limit fruit to two servings per day. A serving is  cup or 1 small piece.  Choose foods with less than 2 g of fiber per serving.  Limit fats to less than 8 tsp (38 g) per day.  Avoid fried foods.  Eat foods that have probiotics in them.  Probiotics can be found in certain dairy products.  Avoid foods and beverages that may increase the speed at which food moves through the stomach and intestines (gastrointestinal tract). Things to avoid include:  High-fiber foods, such as dried fruit, raw fruits and vegetables, nuts, seeds, and whole grain foods.  Spicy foods and high-fat foods.  Foods and beverages sweetened with high-fructose corn syrup, honey, or sugar alcohols such as xylitol, sorbitol,  and mannitol. WHAT FOODS ARE RECOMMENDED? Grains White rice. White, [place], or pita breads (fresh or toasted), including plain rolls, buns, or bagels. White pasta. Saltine, soda, or graham crackers. Pretzels. Low-fiber cereal. Cooked cereals made with water (such as cornmeal, farina, or cream cereals). Plain muffins. Matzo. Melba toast. Zwieback.  Vegetables Potatoes (without the skin). Strained tomato and vegetable juices. Most well-cooked and canned vegetables without seeds. Tender lettuce. Fruits Cooked or canned applesauce, apricots, cherries, fruit cocktail, grapefruit, peaches, pears, or plums. Fresh bananas, apples without skin, cherries, grapes, cantaloupe, grapefruit, peaches, oranges, or plums.  Meat and Other Protein Products Baked or boiled chicken. Eggs. Tofu. Fish. Seafood. Smooth peanut butter. Ground or well-cooked tender beef, ham, veal, lamb, pork, or poultry.  Dairy Plain yogurt, kefir, and unsweetened liquid yogurt. Lactose-free milk, buttermilk, or soy milk. Plain hard cheese. Beverages Sport drinks. Clear broths. Diluted fruit juices (except prune). Regular, caffeine-free sodas such as ginger ale. Water. Decaffeinated teas. Oral rehydration solutions. Sugar-free beverages not sweetened with sugar alcohols. Other Bouillon, broth, or soups made from recommended foods.  The items listed above may not be a complete list of recommended foods or beverages. Contact your dietitian for more options. WHAT FOODS ARE NOT RECOMMENDED? Grains Whole grain, whole wheat, bran, or rye breads, rolls, pastas, crackers, and cereals. Wild or brown rice. Cereals that contain more than 2 g of fiber per serving. Corn tortillas or taco shells. Cooked or dry oatmeal. Granola. Popcorn. Vegetables Raw vegetables. Cabbage, broccoli, Brussels sprouts, artichokes, baked beans, beet greens, corn, kale, legumes, peas, sweet potatoes, and yams. Potato skins. Cooked spinach and cabbage. Fruits Dried fruit,  including raisins and dates. Raw fruits. Stewed or dried prunes. Fresh apples with skin, apricots, mangoes, pears, raspberries, and strawberries.  Meat and Other Protein Products Chunky peanut butter. Nuts and seeds. Beans and lentils. Berniece Salines.  Dairy High-fat cheeses. Milk, chocolate milk, and beverages made with milk, such as milk shakes. Cream. Ice cream. Sweets and Desserts Sweet rolls, doughnuts, and sweet breads. Pancakes and waffles. Fats and Oils Butter. Cream sauces. Margarine. Salad oils. Plain salad dressings. Olives. Avocados.  Beverages Caffeinated beverages (such as coffee, tea, soda, or energy drinks). Alcoholic beverages. Fruit juices with pulp. Prune juice. Soft drinks sweetened with high-fructose corn syrup or sugar alcohols. Other Coconut. Hot sauce. Chili powder. Mayonnaise. Gravy. Cream-based or milk-based soups.  The items listed above may not be a complete list of foods and beverages to avoid. Contact your dietitian for more information. WHAT SHOULD I DO IF I BECOME DEHYDRATED? Diarrhea can sometimes lead to dehydration. Signs of dehydration include dark urine and dry mouth and skin. If you think you are dehydrated, you should rehydrate with an oral rehydration solution. These solutions can be purchased at pharmacies, retail stores, or online.  Drink -1 cup (120-240 mL) of oral rehydration solution each time you have an episode of diarrhea. If drinking this amount makes your diarrhea worse, try drinking smaller amounts more often. For example, drink 1-3 tsp (5-15 mL) every 5-10 minutes.  A general rule for staying hydrated is  to drink 1-2 L of fluid per day. Talk to your health care provider about the specific amount you should be drinking each day. Drink enough fluids to keep your urine clear or pale yellow. Document Released: 12/14/2003 Document Revised: 09/28/2013 Document Reviewed: 08/16/2013 [facility] Patient Information 2015 [place], [place]. This information is not  intended to replace advice given to you by your health care provider. Make sure you discuss any questions you have with your health care provider.  Low-Fiber Diet Fiber is found in fruits, vegetables, and whole grains. A low-fiber diet restricts fibrous foods that are not digested in the small intestine. A diet containing about 10-15 grams of fiber per day is considered low fiber. Low-fiber diets may be used to:  Promote healing and rest the bowel during intestinal flare-ups.  Prevent blockage of a partially obstructed or narrowed gastrointestinal tract.  Reduce fecal weight and volume.  Slow the movement of feces. You may be on a low-fiber diet as a transitional diet following surgery, after an injury (trauma), or because of a short (acute) or lifelong (chronic) illness. Your health care provider will determine the length of time you need to stay on this diet.  WHAT DO I NEED TO KNOW ABOUT A LOW-FIBER DIET? Always check the fiber content on the packaging's Nutrition Facts label, especially on foods from the grains list. Ask your dietitian if you have questions about specific foods that are related to your condition, especially if the food is not listed below. In general, a low-fiber food will have less than 2 g of fiber. WHAT FOODS CAN I EAT? Grains All breads and crackers made with white flour. Sweet rolls, doughnuts, waffles, pancakes, [place] toast, bagels. Pretzels, Melba toast, zwieback. Well-cooked cereals, such as cornmeal, farina, or cream cereals. Dry cereals that do not contain whole grains, fruit, or nuts, such as refined corn, wheat, rice, and oat cereals. Potatoes prepared any way without skins, plain pastas and noodles, refined white rice. Use white flour for baking and making sauces. Use allowed list of grains for casseroles, dumplings, and puddings.  Vegetables Strained tomato and vegetable juices. Fresh lettuce, cucumber, spinach. Well-cooked (no skin or pulp) or canned  vegetables, such as asparagus, bean sprouts, beets, carrots, green beans, mushrooms, potatoes, pumpkin, spinach, yellow squash, tomato sauce/puree, turnips, yams, and zucchini. Keep servings limited to  cup.  Fruits All fruit juices except prune juice. Cooked or canned fruits without skin and seeds, such as applesauce, apricots, cherries, fruit cocktail, grapefruit, grapes, mandarin oranges, melons, peaches, pears, pineapple, and plums. Fresh fruits without skin, such as apricots, avocados, bananas, melons, pineapple, nectarines, and peaches. Keep servings limited to  cup or 1 piece.  Meat and Other Protein Sources Ground or well-cooked tender beef, ham, veal, lamb, pork, or poultry. Eggs, plain cheese. Fish, oysters, shrimp, lobster, and other seafood. Liver, organ meats. Smooth nut butters. Dairy All milk products and alternative dairy substitutes, such as soy, rice, almond, and coconut, not containing added whole nuts, seeds, or added fruit. Beverages Decaf coffee, fruit, and vegetable juices or smoothies (small amounts, with no pulp or skins, and with fruits from allowed list), sports drinks, herbal tea. Condiments Ketchup, mustard, vinegar, cream sauce, cheese sauce, cocoa powder. Spices in moderation, such as allspice, basil, bay leaves, celery powder or leaves, cinnamon, cumin powder, curry powder, ginger, mace, marjoram, onion or garlic powder, oregano, paprika, parsley flakes, ground pepper, rosemary, sage, savory, tarragon, thyme, and turmeric. Sweets and Desserts Plain cakes and cookies, pie made with allowed  fruit, pudding, custard, cream pie. Gelatin, fruit, ice, sherbet, frozen ice pops. Ice cream, ice milk without nuts. Plain hard candy, honey, jelly, molasses, syrup, sugar, chocolate syrup, gumdrops, marshmallows. Limit overall sugar intake.  Fats and Oil Margarine, butter, cream, mayonnaise, salad oils, plain salad dressings made from allowed foods. Choose healthy fats such as olive  oil, canola oil, and omega-3 fatty acids (such as found in salmon or tuna) when possible.  Other Bouillon, broth, or cream soups made from allowed foods. Any strained soup. Casseroles or mixed dishes made with allowed foods. The items listed above may not be a complete list of recommended foods or beverages. Contact your dietitian for more options.  WHAT FOODS ARE NOT RECOMMENDED? Grains All whole wheat and whole grain breads and crackers. Multigrains, rye, bran seeds, nuts, or coconut. Cereals containing whole grains, multigrains, bran, coconut, nuts, raisins. Cooked or dry oatmeal, steel-cut oats. Coarse wheat cereals, granola. Cereals advertised as high fiber. Potato skins. Whole grain pasta, wild or brown rice. Popcorn. Coconut flour. Bran, buckwheat, corn bread, multigrains, rye, wheat germ.  Vegetables Fresh, cooked or canned vegetables, such as artichokes, asparagus, beet greens, broccoli, Brussels sprouts, cabbage, celery, cauliflower, corn, eggplant, kale, legumes or beans, okra, peas, and tomatoes. Avoid large servings of any vegetables, especially raw vegetables.  Fruits Fresh fruits, such as apples with or without skin, berries, cherries, figs, grapes, grapefruit, guavas, kiwis, mangoes, oranges, papayas, pears, persimmons, pineapple, and pomegranate. Prune juice and juices with pulp, stewed or dried prunes. Dried fruits, dates, raisins. Fruit seeds or skins. Avoid large servings of all fresh fruits. Meats and Other Protein Sources Tough, fibrous meats with gristle. Chunky nut butter. Cheese made with seeds, nuts, or other foods not recommended. Nuts, seeds, legumes (beans, including baked beans), dried peas, beans, lentils.  Dairy Yogurt or cheese that contains nuts, seeds, or added fruit.  Beverages Fruit juices with high pulp, prune juice. Caffeinated coffee and teas.  Condiments Coconut, maple syrup, pickles, olives. Sweets and Desserts Desserts, cookies, or candies that contain  nuts or coconut, chunky peanut butter, dried fruits. Jams, preserves with seeds, marmalade. Large amounts of sugar and sweets. Any other dessert made with fruits from the not recommended list.  Other Soups made from vegetables that are not recommended or that contain other foods not recommended.  The items listed above may not be a complete list of foods and beverages to avoid. Contact your dietitian for more information. Document Released: 03/15/2002 Document Revised: 09/28/2013 Document Reviewed: 08/16/2013 [facility] Patient Information 2015 [place], [place]. This information is not intended to replace advice given to you by your health care provider. Make sure you discuss any questions you have with your health care provider.

## 2014-11-15 NOTE — Progress Notes (Signed)
Reviewed and agree. Suggest OTC probiotic to normalize bacterial flora just in case she is having bacterial overgrowth.

## 2014-11-15 NOTE — Telephone Encounter (Signed)
Patient states over the weekend, she started having cramping, diarrhea and burning in upper abdominal area. She reports she is only taking in clear liquids. When she eats, it feels like her intestines are "squeezing." She reports chills but no fever. Scheduled with Cecille Rubin Hvozdovic, PA-C today at 1:15 PM.

## 2014-11-16 ENCOUNTER — Telehealth: Payer: Self-pay | Admitting: Physician Assistant

## 2014-11-16 NOTE — Progress Notes (Signed)
Tamara Cross, can you see how pt is feeling?She can consider a probiotic.

## 2014-11-16 NOTE — Telephone Encounter (Signed)
See results note. 

## 2014-11-25 ENCOUNTER — Ambulatory Visit: Payer: 59 | Admitting: Internal Medicine

## 2014-11-28 ENCOUNTER — Inpatient Hospital Stay (HOSPITAL_COMMUNITY)
Admission: EM | Admit: 2014-11-28 | Discharge: 2014-11-30 | DRG: 411 | Disposition: A | Discharge status: Home / Self Care | Payer: 59 | Attending: General Surgery | Admitting: General Surgery

## 2014-11-28 ENCOUNTER — Encounter (HOSPITAL_COMMUNITY): Payer: Self-pay | Admitting: Emergency Medicine

## 2014-11-28 ENCOUNTER — Emergency Department (HOSPITAL_COMMUNITY): Payer: 59

## 2014-11-28 DIAGNOSIS — K851 Biliary acute pancreatitis without necrosis or infection: Secondary | ICD-10-CM | POA: Diagnosis present

## 2014-11-28 DIAGNOSIS — Z853 Personal history of malignant neoplasm of breast: Secondary | ICD-10-CM

## 2014-11-28 DIAGNOSIS — K219 Gastro-esophageal reflux disease without esophagitis: Secondary | ICD-10-CM | POA: Diagnosis present

## 2014-11-28 DIAGNOSIS — Z833 Family history of diabetes mellitus: Secondary | ICD-10-CM | POA: Diagnosis not present

## 2014-11-28 DIAGNOSIS — Z79810 Long term (current) use of selective estrogen receptor modulators (SERMs): Secondary | ICD-10-CM | POA: Diagnosis not present

## 2014-11-28 DIAGNOSIS — R002 Palpitations: Secondary | ICD-10-CM | POA: Diagnosis present

## 2014-11-28 DIAGNOSIS — Z923 Personal history of irradiation: Secondary | ICD-10-CM | POA: Diagnosis not present

## 2014-11-28 DIAGNOSIS — Z8249 Family history of ischemic heart disease and other diseases of the circulatory system: Secondary | ICD-10-CM | POA: Diagnosis not present

## 2014-11-28 DIAGNOSIS — K769 Liver disease, unspecified: Secondary | ICD-10-CM | POA: Diagnosis present

## 2014-11-28 DIAGNOSIS — K279 Peptic ulcer, site unspecified, unspecified as acute or chronic, without hemorrhage or perforation: Secondary | ICD-10-CM | POA: Diagnosis present

## 2014-11-28 DIAGNOSIS — Z882 Allergy status to sulfonamides status: Secondary | ICD-10-CM

## 2014-11-28 DIAGNOSIS — Z87891 Personal history of nicotine dependence: Secondary | ICD-10-CM | POA: Diagnosis not present

## 2014-11-28 DIAGNOSIS — R1011 Right upper quadrant pain: Secondary | ICD-10-CM

## 2014-11-28 DIAGNOSIS — G43909 Migraine, unspecified, not intractable, without status migrainosus: Secondary | ICD-10-CM | POA: Diagnosis present

## 2014-11-28 DIAGNOSIS — I341 Nonrheumatic mitral (valve) prolapse: Secondary | ICD-10-CM | POA: Diagnosis present

## 2014-11-28 DIAGNOSIS — F418 Other specified anxiety disorders: Secondary | ICD-10-CM | POA: Diagnosis present

## 2014-11-28 DIAGNOSIS — K8020 Calculus of gallbladder without cholecystitis without obstruction: Secondary | ICD-10-CM

## 2014-11-28 DIAGNOSIS — Z800 Family history of malignant neoplasm of digestive organs: Secondary | ICD-10-CM

## 2014-11-28 DIAGNOSIS — K8000 Calculus of gallbladder with acute cholecystitis without obstruction: Secondary | ICD-10-CM | POA: Diagnosis present

## 2014-11-28 DIAGNOSIS — G8929 Other chronic pain: Secondary | ICD-10-CM | POA: Diagnosis present

## 2014-11-28 DIAGNOSIS — Z888 Allergy status to other drugs, medicaments and biological substances status: Secondary | ICD-10-CM

## 2014-11-28 DIAGNOSIS — K819 Cholecystitis, unspecified: Secondary | ICD-10-CM

## 2014-11-28 LAB — URINALYSIS, ROUTINE W REFLEX MICROSCOPIC
Bilirubin Urine: NEGATIVE
GLUCOSE, UA: NEGATIVE mg/dL
HGB URINE DIPSTICK: NEGATIVE
Ketones, ur: NEGATIVE mg/dL
LEUKOCYTES UA: NEGATIVE
NITRITE: NEGATIVE
Protein, ur: NEGATIVE mg/dL
Specific Gravity, Urine: 1.017 (ref 1.005–1.030)
Urobilinogen, UA: 1 mg/dL (ref 0.0–1.0)
pH: 6.5 (ref 5.0–8.0)

## 2014-11-28 LAB — CBC WITH DIFFERENTIAL/PLATELET
Basophils Absolute: 0 10*3/uL (ref 0.0–0.1)
Basophils Relative: 1 % (ref 0–1)
EOS PCT: 1 % (ref 0–5)
Eosinophils Absolute: 0.1 10*3/uL (ref 0.0–0.7)
HEMATOCRIT: 39.9 % (ref 36.0–46.0)
HEMOGLOBIN: 13.2 g/dL (ref 12.0–15.0)
Lymphocytes Relative: 15 % (ref 12–46)
Lymphs Abs: 0.9 10*3/uL (ref 0.7–4.0)
MCH: 29.9 pg (ref 26.0–34.0)
MCHC: 33.1 g/dL (ref 30.0–36.0)
MCV: 90.3 fL (ref 78.0–100.0)
MONOS PCT: 6 % (ref 3–12)
Monocytes Absolute: 0.4 10*3/uL (ref 0.1–1.0)
NEUTROS ABS: 5 10*3/uL (ref 1.7–7.7)
Neutrophils Relative %: 77 % (ref 43–77)
PLATELETS: 210 10*3/uL (ref 150–400)
RBC: 4.42 MIL/uL (ref 3.87–5.11)
RDW: 12 % (ref 11.5–15.5)
WBC: 6.5 10*3/uL (ref 4.0–10.5)

## 2014-11-28 LAB — COMPREHENSIVE METABOLIC PANEL
ALT: 41 U/L — ABNORMAL HIGH (ref 0–35)
ANION GAP: 3 — AB (ref 5–15)
AST: 73 U/L — ABNORMAL HIGH (ref 0–37)
Albumin: 3.2 g/dL — ABNORMAL LOW (ref 3.5–5.2)
Alkaline Phosphatase: 50 U/L (ref 39–117)
BUN: 14 mg/dL (ref 6–23)
CO2: 30 mmol/L (ref 19–32)
Calcium: 8.2 mg/dL — ABNORMAL LOW (ref 8.4–10.5)
Chloride: 107 mmol/L (ref 96–112)
Creatinine, Ser: 0.7 mg/dL (ref 0.50–1.10)
GFR calc Af Amer: >90 mL/min (ref >90)
GFR calc non Af Amer: >90 mL/min (ref >90)
GLUCOSE: 118 mg/dL — AB (ref 70–99)
POTASSIUM: 3.9 mmol/L (ref 3.5–5.1)
SODIUM: 140 mmol/L (ref 135–145)
Total Bilirubin: 0.4 mg/dL (ref 0.3–1.2)
Total Protein: 5.6 g/dL — ABNORMAL LOW (ref 6.0–8.3)

## 2014-11-28 LAB — LIPASE, BLOOD: Lipase: 146 U/L — ABNORMAL HIGH (ref 11–59)

## 2014-11-28 MED ORDER — TAMOXIFEN CITRATE 10 MG PO TABS
20.0000 mg | ORAL_TABLET | Freq: Every day | ORAL | Status: DC
  Administered 2014-11-30: 20 mg via ORAL
  Filled 2014-11-28 (×4): qty 2

## 2014-11-28 MED ORDER — ACETAMINOPHEN 325 MG PO TABS: 650.0000 mg | ORAL_TABLET | Freq: Four times a day (QID) | ORAL | Status: DC | PRN

## 2014-11-28 MED ORDER — PROMETHAZINE HCL 25 MG/ML IJ SOLN
12.5000 mg | Freq: Four times a day (QID) | INTRAMUSCULAR | Status: DC | PRN
  Administered 2014-11-28: 12.5 mg via INTRAVENOUS
  Administered 2014-11-28 – 2014-11-30 (×2): 25 mg via INTRAVENOUS
  Filled 2014-11-28 (×3): qty 1

## 2014-11-28 MED ORDER — PROMETHAZINE HCL 25 MG/ML IJ SOLN
12.5000 mg | Freq: Once | INTRAMUSCULAR | Status: DC
Start: 2014-11-28 — End: 2014-11-29
  Filled 2014-11-28: qty 1

## 2014-11-28 MED ORDER — PANTOPRAZOLE SODIUM 40 MG PO TBEC
40.0000 mg | DELAYED_RELEASE_TABLET | Freq: Every day | ORAL | Status: DC
  Administered 2014-11-30: 40 mg via ORAL
  Filled 2014-11-28 (×2): qty 1

## 2014-11-28 MED ORDER — LAMOTRIGINE 100 MG PO TABS
100.0000 mg | ORAL_TABLET | Freq: Every day | ORAL | Status: DC
Start: 2014-11-28 — End: 2014-11-30
  Administered 2014-11-29: 100 mg via ORAL
  Filled 2014-11-28 (×3): qty 1

## 2014-11-28 MED ORDER — SODIUM CHLORIDE 0.9 % IV BOLUS (SEPSIS)
1000.0000 mL | Freq: Once | INTRAVENOUS | Status: AC
  Administered 2014-11-28: 1000 mL via INTRAVENOUS

## 2014-11-28 MED ORDER — HYDROMORPHONE HCL 1 MG/ML IJ SOLN
1.0000 mg | INTRAMUSCULAR | Status: DC | PRN
  Administered 2014-11-29 – 2014-11-30 (×5): 1 mg via INTRAVENOUS
  Filled 2014-11-28 (×5): qty 1

## 2014-11-28 MED ORDER — LAMOTRIGINE 100 MG PO TABS
100.0000 mg | ORAL_TABLET | Freq: Two times a day (BID) | ORAL | Status: DC
  Filled 2014-11-28: qty 2

## 2014-11-28 MED ORDER — HYDROCODONE-ACETAMINOPHEN 5-325 MG PO TABS
1.0000 | ORAL_TABLET | ORAL | Status: DC | PRN
  Administered 2014-11-28 – 2014-11-30 (×3): 2 via ORAL
  Filled 2014-11-28 (×3): qty 2

## 2014-11-28 MED ORDER — ENOXAPARIN SODIUM 40 MG/0.4ML ~~LOC~~ SOLN
40.0000 mg | SUBCUTANEOUS | Status: DC
  Administered 2014-11-28: 40 mg via SUBCUTANEOUS
  Filled 2014-11-28 (×2): qty 0.4

## 2014-11-28 MED ORDER — METOPROLOL SUCCINATE ER 50 MG PO TB24
50.0000 mg | ORAL_TABLET | Freq: Every day | ORAL | Status: DC
  Administered 2014-11-29 – 2014-11-30 (×2): 50 mg via ORAL
  Filled 2014-11-28 (×2): qty 1

## 2014-11-28 MED ORDER — DEXTROSE 5 % IV SOLN
2.0000 g | INTRAVENOUS | Status: DC
  Administered 2014-11-28 – 2014-11-29 (×2): 2 g via INTRAVENOUS
  Filled 2014-11-28 (×2): qty 2

## 2014-11-28 MED ORDER — LAMOTRIGINE 200 MG PO TABS
200.0000 mg | ORAL_TABLET | Freq: Every day | ORAL | Status: DC
Start: 2014-11-28 — End: 2014-11-30
  Administered 2014-11-30: 200 mg via ORAL
  Filled 2014-11-28 (×3): qty 1

## 2014-11-28 MED ORDER — HYDROMORPHONE HCL 1 MG/ML IJ SOLN
1.0000 mg | Freq: Once | INTRAMUSCULAR | Status: AC
  Administered 2014-11-28: 0.5 mg via INTRAVENOUS
  Filled 2014-11-28: qty 1

## 2014-11-28 MED ORDER — LORAZEPAM BOLUS VIA INFUSION
0.5000 mg | Freq: Three times a day (TID) | INTRAVENOUS | Status: DC | PRN
  Filled 2014-11-28: qty 1

## 2014-11-28 MED ORDER — METOPROLOL SUCCINATE ER 50 MG PO TB24: 50.0000 mg | ORAL_TABLET | Freq: Every day | ORAL | Status: DC

## 2014-11-28 MED ORDER — ACETAMINOPHEN 650 MG RE SUPP: 650.0000 mg | Freq: Four times a day (QID) | RECTAL | Status: DC | PRN

## 2014-11-28 MED ORDER — PANTOPRAZOLE SODIUM 40 MG PO TBEC: 40.0000 mg | DELAYED_RELEASE_TABLET | Freq: Every day | ORAL | Status: DC

## 2014-11-28 MED ORDER — DEXTROSE-NACL 5-0.9 % IV SOLN
INTRAVENOUS | Status: DC
  Administered 2014-11-28 – 2014-11-29 (×2): via INTRAVENOUS

## 2014-11-28 NOTE — Progress Notes (Signed)
Pt and family was concerned about getting daily medications. This was discussed during shift change with day shift RN in the room with myself. Both RN's educated both husband and patient about not taking home medications, because we will supply the medications and for the safety reasons of not double dosing meds. Husband stated that he has her medications with him tonight, but stated he would not give these meds and will be taking them home in an hour. After nurses left the room the husband gave these medications to patient saying "its okay to do it just tonight". When trying to give 2200 lamictal and tamoxifen patient said her husband already gave her those medications before leaving. I reeducated about the safety concerns for this and she said he will not be bringing them back. This information was passed on to next shift.

## 2014-11-28 NOTE — ED Notes (Signed)
Attempted report 

## 2014-11-28 NOTE — ED Notes (Signed)
Patient coming from home with sudden onset of generalized abdominal pain/cramping.  Pain is currently 9/10.

## 2014-11-28 NOTE — ED Provider Notes (Signed)
CSN: [id number]     Arrival date & time 11/28/14  0720 History   First MD Initiated Contact with Patient 11/28/14 [phone]     Chief Complaint  Patient presents with  . Abdominal Pain     (Consider location/radiation/quality/duration/timing/severity/associated sxs/prior Treatment) Patient is a 49 y.o. female presenting with abdominal pain. The history is provided by the patient. No language interpreter was used.  Abdominal Pain Pain location:  RUQ Pain quality: aching and stabbing   Pain radiates to:  RUQ Pain severity:  Moderate Onset quality:  Gradual Duration:  2 weeks Timing:  Constant Progression:  Worsening Chronicity:  New Context: recent illness   Relieved by:  Nothing Worsened by:  Nothing tried Ineffective treatments:  None tried Associated symptoms: no diarrhea   Risk factors: multiple surgeries     Past Medical History  Diagnosis Date  . Allergy     chroni rhinitis  . GERD (gastroesophageal reflux disease)   . Peptic ulcer disease   . Heart murmur     MVP  . Migraine   . Arthritis   . 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 Complication of anesthesia     last 2 surgeries had some tachycardia  . Dysrhythmia     tachy occ  . Pneumonia     hx  . Depression   . Anxiety   . MVP (mitral valve prolapse)   . H/O hiatal hernia   . IBS (irritable bowel syndrome)   . Esophageal stricture   . S/P radiation therapy  06/28/2014-08/08/2014     1) Right Breast / 50 Gy in 25 fractions/ 2) Right Breast Boost / 10 Gy in 5 fractions   Past Surgical History  Procedure Laterality Date  . Cesarean section  2002  . Appendectomy    . Tonsillectomy    . Cystectomy      left breast,in 20's  . Breast lumpectomy Bilateral   . Shoulder surgery Left     debridement ,bone spurs  . Dilation and curettage of uterus      x2  . Breast lumpectomy with needle localization Bilateral 05/02/2014    Procedure: BILATERIAL BREAST LUMPECTOMY  WITH NEEDLE LOCALIZATION;  Surgeon: Shann Medal, MD;  Location: [facility];  Service: General;  Laterality: Bilateral;  . Colposcopy    . Novasure ablation    . Cervix lesion destruction    . Esophagogastroduodenoscopy N/A 10/06/2014    Procedure: ESOPHAGOGASTRODUODENOSCOPY (EGD);  Surgeon: Lafayette Dragon, MD;  Location: [facility];  Service: Endoscopy;  Laterality: N/A;  . Colonoscopy N/A 10/06/2014    Procedure: COLONOSCOPY;  Surgeon: Lafayette Dragon, MD;  Location: [facility];  Service: Endoscopy;  Laterality: N/A;   Family History  Problem Relation Age of Onset  . Heart disease Father   . Hypertension Father   . Colon cancer Maternal Grandmother   . Depression Brother   . Breast cancer Neg Hx   . Diabetes Maternal Grandfather   . Heart disease Maternal Grandfather   . Colon cancer Maternal Uncle     x 2  . Colon polyps      x 9 maternal uncles/aunts   History  Substance Use Topics  . Smoking status: Former Smoker -- 0.25 packs/day for 10 years    Types: Cigarettes    Start date: 02/23/1998  . Smokeless tobacco: Never Used  . Alcohol Use: 3.0 oz/week    5 Glasses of wine per week  Comment: socially   OB History    Gravida Para Term Preterm AB TAB SAB Ectopic Multiple Living   1 1              Obstetric Comments   She is not on hormones.  She had her last menstrual period around 2013.     Review of Systems  Gastrointestinal: Positive for abdominal pain. Negative for diarrhea.  All other systems reviewed and are negative.     Allergies  Imitrex; Sulfonamide derivatives; Reglan; Zofran; and Ondansetron  Home Medications   Prior to Admission medications   Medication Sig Start Date End Date Taking? Authorizing Provider  acetaminophen (TYLENOL) 500 MG tablet Take 1,000 mg by mouth daily as needed for mild pain.    Historical Provider, MD  clonazePAM (KLONOPIN) 0.5 MG tablet Take 0.5 mg by mouth at bedtime as needed for anxiety.     Historical Provider, MD   desvenlafaxine (PRISTIQ) 100 MG 24 hr tablet Take 100 mg by mouth daily.      Historical Provider, MD  lamoTRIgine (LAMICTAL) 100 MG tablet Take 100-200 mg by mouth 2 (two) times daily. Takes 200mg  every morning and 100mg  every night at bedtime.    Historical Provider, MD  metoprolol succinate (TOPROL-XL) 50 MG 24 hr tablet Take 1 tablet (50 mg total) by mouth daily with or  immediately following a  meal. 08/04/14   Deboraha Sprang, MD  pantoprazole (PROTONIX) 40 MG tablet Take 1 tablet (40 mg total) by mouth daily. 09/16/14   Lafayette Dragon, MD  promethazine (PHENERGAN) 25 MG tablet Take 1 tablet (25 mg total) by mouth every 8 (eight) hours as needed for nausea or vomiting. 11/15/14   Lori P Hvozdovic, PA-C  tamoxifen (NOLVADEX) 20 MG tablet Take 1 tablet (20 mg total) by mouth daily. 07/28/14   Rulon Eisenmenger, MD  traZODone (DESYREL) 100 MG tablet Take 100 mg by mouth at bedtime.  05/03/13   Historical Provider, MD  valACYclovir (VALTREX) 500 MG tablet Take 500 mg by mouth daily as needed (for breakouts).     Historical Provider, MD  VYVANSE 40 MG capsule 40 mg daily. 08/29/14   Historical Provider, MD   BP 104/47 mmHg  Pulse 84  Temp(Src) 98.1 F (36.7 C) (Oral)  Resp 20  SpO2 99% Physical Exam  Constitutional: She appears well-developed and well-nourished.  HENT:  Head: Normocephalic and atraumatic.  Right Ear: External ear normal.  Left Ear: External ear normal.  Nose: Nose normal.  Mouth/Throat: Oropharynx is clear and moist.  Eyes: Conjunctivae are normal. Pupils are equal, round, and reactive to light.  Neck: Normal range of motion. Neck supple.  Cardiovascular: Normal rate and regular rhythm.   Pulmonary/Chest: Effort normal and breath sounds normal.  Abdominal: Soft. There is tenderness.  Tender right upper quadrant  Musculoskeletal: Normal range of motion.  Neurological: She is alert.  Skin: Skin is warm.  Psychiatric: She has a normal mood and affect.  Nursing note and  vitals reviewed.   ED Course  Procedures (including critical care time) Labs Review Labs Reviewed  COMPREHENSIVE METABOLIC PANEL - Abnormal; Notable for the following:    Glucose, Bld 118 (*)    Calcium 8.2 (*)    Total Protein 5.6 (*)    Albumin 3.2 (*)    AST 73 (*)    ALT 41 (*)    Anion gap 3 (*)    All other components within normal limits  LIPASE, BLOOD - Abnormal;  Notable for the following:    Lipase 146 (*)    All other components within normal limits  CBC WITH DIFFERENTIAL/PLATELET  URINALYSIS, ROUTINE W REFLEX MICROSCOPIC    Imaging Review US Abdomen Complete  11/28/2014   CLINICAL DATA:  Acute right upper quadrant abdominal pain.  EXAM: ULTRASOUND ABDOMEN COMPLETE  COMPARISON:  September 04, 2007.  FINDINGS: Gallbladder: Multiple small gallstones are noted with significant gallbladder wall thickening measuring 9 mm, but no pericholecystic fluid is noted. No sonographic Murphy's sign is noted.  Common bile duct: Diameter: 5 mm which is within normal limits.  Liver: 14 mm cyst is noted in right hepatic lobe. 13 mm hyperechoic focus is noted in left hepatic lobe medially most consistent with small hemangioma.  IVC: No abnormality visualized.  Pancreas: Visualized portion unremarkable.  Spleen: Size and appearance within normal limits.  Right Kidney: Length: 10.8 cm. Echogenicity within normal limits. 7 mm echogenic focus is noted in right kidney consistent with stable angiomyolipoma.  Left Kidney: Length: 11.4 cm. Echogenicity within normal limits. 6 mm echogenic focus noted in left kidney consistent with angiomyolipoma.  Abdominal aorta: No aneurysm visualized.  Other findings: None.  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  Stable left hepatic cyst. 13 mm hyperechoic focus noted in left hepatic lobe most consistent with hemangioma in the absence of any history of malignancy. Followup ultrasound in 6 months is recommended to ensure stability. If the patient has a  history of malignancy, metastatic disease cannot be excluded and further evaluation with MRI would be recommended at that time.  Cholelithiasis is noted with significant gallbladder wall thickening concerning for acute cholecystitis. Clinical correlation and HIDA scan may be performed further evaluation.   Electronically Signed   By: Marijo Conception, M.D.   On: 11/28/2014 09:34     EKG Interpretation   Date/Time:  Monday November 28 2014 07:29:34 EST Ventricular Rate:  80 PR Interval:  180 QRS Duration: 80 QT Interval:  395 QTC Calculation: 456 R Axis:   35 Text Interpretation:  Sinus rhythm Low voltage, precordial leads No  significant change since last tracing Confirmed by Maryan Rued  MD, Loree Fee  [id number]) on 11/28/2014 7:50:30 AM      MDM  Pt has multiple gallstones and radiologist relays concern for acute cholecystitis.  Pt has elevated lft's and lfts are elevated at 146.  Pt reports pain has improved with pain medication.   Pt has not ate anything since last pm/   Final diagnoses:  RUQ pain  Cholecystitis   I spoke to Dr. Hulen Skains who will see pt here for evaluation.      Hollace Kinnier [place], PA-C 11/28/14 [place], [place] 11/28/14 1028  Blanchie Dessert, MD 11/28/14 [phone]

## 2014-11-28 NOTE — ED Notes (Signed)
Patient transported to Ultrasound 

## 2014-11-28 NOTE — H&P (Signed)
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 Lucia Gaskins, anxiety/depression, palpitations followed by Dr. Caryl Comes, chronic abdominal pain followed by Dr. Olevia Perches, migraines presenting with epigastric abdominal pain.  Duration of symptoms is 2 weeks, last episode began suddenly at 3AM.  Location is the epigastric region band like and radiates to sides and back.  Severe in severity.  Characterized as doubling over pain.  Associated with nausea.  She denies vomiting, fever or chills.  No aggravating factors.  No alleviating factors.  Modifying factors include; tums, protonix and phenergan.  She had pizza and wings for dinner last night. She reports 12-13lb weight loss over the last 2 months, unintentional.  She denies constipation, reports diarrhea.  She has a strong family history of colon CA, negative colonoscopy and endoscopy in December of 2015.  Her work up shows a normal white count, elevated AST and ALT with a normal total bilirubin, lipase 146.  Abdominal US showed chololithiasis, GB wall thickening, 35m hyperchoic focus in the left hepatic lobe.  We have therefore been asked to evaluate the patient.    Past Medical History  Diagnosis Date  . Allergy     chroni rhinitis  . GERD (gastroesophageal reflux disease)   . Peptic ulcer disease   . Heart murmur     MVP  . Migraine   . Arthritis   . 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 Complication of anesthesia     last 2 surgeries had some tachycardia  . Dysrhythmia     tachy occ  . Pneumonia     hx  . Depression   . Anxiety   . MVP (mitral valve prolapse)   . H/O hiatal hernia   . IBS (irritable bowel syndrome)   . Esophageal stricture   . S/P radiation therapy  06/28/2014-08/08/2014     1) Right Breast / 50 Gy in 25 fractions/ 2) Right Breast Boost / 10 Gy in 5 fractions    Past Surgical  History  Procedure Laterality Date  . Cesarean section  2002  . Appendectomy    . Tonsillectomy    . Cystectomy      left breast,in 20's  . Breast lumpectomy Bilateral   . Shoulder surgery Left     debridement ,bone spurs  . Dilation and curettage of uterus      x2  . Breast lumpectomy with needle localization Bilateral 05/02/2014    Procedure: BILATERIAL BREAST LUMPECTOMY WITH NEEDLE LOCALIZATION;  Surgeon: DShann Medal MD;  [facility]  Service: General;  Laterality: Bilateral;  . Colposcopy    . Novasure ablation    . Cervix lesion destruction    . Esophagogastroduodenoscopy N/A 10/06/2014    Procedure: ESOPHAGOGASTRODUODENOSCOPY (EGD);  Surgeon: DLafayette Dragon MD;  [facility];  Service: Endoscopy;  Laterality: N/A;  . Colonoscopy N/A 10/06/2014    Procedure: COLONOSCOPY;  Surgeon: DLafayette Dragon MD;  Location: [facility];  Service: Endoscopy;  Laterality: N/A;    Family History  Problem Relation Age of Onset  . Heart disease Father   . Hypertension Father   . Colon cancer Maternal Grandmother   . Depression Brother   . Breast cancer Neg Hx   . Diabetes Maternal Grandfather   . Heart disease Maternal Grandfather   . Colon cancer Maternal Uncle     x  2  . Colon polyps      x 9 maternal uncles/aunts   Social History:  reports that she has quit smoking. Her smoking use included Cigarettes. She started smoking about 16 years ago. She has a 2.5 pack-year smoking history. She has never used smokeless tobacco. She reports that she drinks about 3.0 oz of alcohol per week. She reports that she does not use illicit drugs.  Medication: Prior to Admission medications   Medication Sig Start Date End Date Taking? Authorizing Provider  acetaminophen (TYLENOL) 500 MG tablet Take 1,000 mg by mouth daily as needed for mild pain.   Yes Historical Provider, MD  clonazePAM (KLONOPIN) 0.5 MG tablet Take 0.5 mg by mouth at bedtime as needed for anxiety.    Yes Historical  Provider, MD  desvenlafaxine (PRISTIQ) 100 MG 24 hr tablet Take 100 mg by mouth daily.     Yes Historical Provider, MD  lamoTRIgine (LAMICTAL) 100 MG tablet Take 100-200 mg by mouth 2 (two) times daily. Takes 220m every morning and 1065mevery night at bedtime.   Yes Historical Provider, MD  metoprolol succinate (TOPROL-XL) 50 MG 24 hr tablet Take 1 tablet (50 mg total) by mouth daily with or  immediately following a  meal. 08/04/14  Yes StDeboraha SprangMD  pantoprazole (PROTONIX) 40 MG tablet Take 1 tablet (40 mg total) by mouth daily. 09/16/14  Yes DoLafayette DragonMD  promethazine (PHENERGAN) 25 MG tablet Take 1 tablet (25 mg total) by mouth every 8 (eight) hours as needed for nausea or vomiting. 11/15/14  Yes Lori P Hvozdovic, PA-C  tamoxifen (NOLVADEX) 20 MG tablet Take 1 tablet (20 mg total) by mouth daily. 07/28/14  Yes ViRulon EisenmengerMD  traZODone (DESYREL) 100 MG tablet Take 100 mg by mouth at bedtime.  05/03/13  Yes Historical Provider, MD  valACYclovir (VALTREX) 500 MG tablet Take 500 mg by mouth daily as needed (for breakouts).    Yes Historical Provider, MD  VYVANSE 50 MG capsule Take 50 mg by mouth daily. 11/06/14  Yes Historical Provider, MD    Allergies:  Allergies  Allergen Reactions  . Imitrex [Sumatriptan] Anaphylaxis  . Sulfonamide Derivatives Shortness Of Breath  . Reglan [Metoclopramide] Other (See Comments)    REACTION: Possible cause of SVT.  . Marland Kitchenofran [Ondansetron Hcl] Other (See Comments)    REACTION: Possible cause of SVT  . Ondansetron Palpitations     (Not in a hospital admission)  Results for orders placed or performed during the hospital encounter of 11/28/14 (from the past 48 hour(s))  CBC with Differential/Platelet     Status: None   Collection Time: 11/28/14  7:48 AM  Result Value Ref Range   WBC 6.5 4.0 - 10.5 K/uL   RBC 4.42 3.87 - 5.11 MIL/uL   Hemoglobin 13.2 12.0 - 15.0 g/dL   HCT 39.9 36.0 - 46.0 %   MCV 90.3 78.0 - 100.0 fL   MCH 29.9 26.0 -  34.0 pg   MCHC 33.1 30.0 - 36.0 g/dL   RDW 12.0 11.5 - 15.5 %   Platelets 210 150 - 400 K/uL   Neutrophils Relative % 77 43 - 77 %   Neutro Abs 5.0 1.7 - 7.7 K/uL   Lymphocytes Relative 15 12 - 46 %   Lymphs Abs 0.9 0.7 - 4.0 K/uL   Monocytes Relative 6 3 - 12 %   Monocytes Absolute 0.4 0.1 - 1.0 K/uL   Eosinophils Relative 1 0 - 5 %  Eosinophils Absolute 0.1 0.0 - 0.7 K/uL   Basophils Relative 1 0 - 1 %   Basophils Absolute 0.0 0.0 - 0.1 K/uL  Comprehensive metabolic panel     Status: Abnormal   Collection Time: 11/28/14  7:48 AM  Result Value Ref Range   Sodium 140 135 - 145 mmol/L   Potassium 3.9 3.5 - 5.1 mmol/L   Chloride 107 96 - 112 mmol/L   CO2 30 19 - 32 mmol/L   Glucose, Bld 118 (H) 70 - 99 mg/dL   BUN 14 6 - 23 mg/dL   Creatinine, Ser 0.70 0.50 - 1.10 mg/dL   Calcium 8.2 (L) 8.4 - 10.5 mg/dL   Total Protein 5.6 (L) 6.0 - 8.3 g/dL   Albumin 3.2 (L) 3.5 - 5.2 g/dL   AST 73 (H) 0 - 37 U/L   ALT 41 (H) 0 - 35 U/L   Alkaline Phosphatase 50 39 - 117 U/L   Total Bilirubin 0.4 0.3 - 1.2 mg/dL   GFR calc non Af Amer >90 >90 mL/min   GFR calc Af Amer >90 >90 mL/min    Comment: (NOTE) The eGFR has been calculated using the CKD EPI equation. This calculation has not been validated in all clinical situations. eGFR's persistently <90 mL/min signify possible Chronic Kidney Disease.    Anion gap 3 (L) 5 - 15  Lipase, blood     Status: Abnormal   Collection Time: 11/28/14  7:48 AM  Result Value Ref Range   Lipase 146 (H) 11 - 59 U/L  Urinalysis, Routine w reflex microscopic     Status: None   Collection Time: 11/28/14 10:24 AM  Result Value Ref Range   Color, Urine YELLOW YELLOW   APPearance CLEAR CLEAR   Specific Gravity, Urine 1.017 1.005 - 1.030   pH 6.5 5.0 - 8.0   Glucose, UA NEGATIVE NEGATIVE mg/dL   Hgb urine dipstick NEGATIVE NEGATIVE   Bilirubin Urine NEGATIVE NEGATIVE   Ketones, ur NEGATIVE NEGATIVE mg/dL   Protein, ur NEGATIVE NEGATIVE mg/dL    Urobilinogen, UA 1.0 0.0 - 1.0 mg/dL   Nitrite NEGATIVE NEGATIVE   Leukocytes, UA NEGATIVE NEGATIVE    Comment: MICROSCOPIC NOT DONE ON URINES WITH NEGATIVE PROTEIN, BLOOD, LEUKOCYTES, NITRITE, OR GLUCOSE <1000 mg/dL.   US Abdomen Complete  11/28/2014   CLINICAL DATA:  Acute right upper quadrant abdominal pain.  EXAM: ULTRASOUND ABDOMEN COMPLETE  COMPARISON:  September 04, 2007.  FINDINGS: Gallbladder: Multiple small gallstones are noted with significant gallbladder wall thickening measuring 9 mm, but no pericholecystic fluid is noted. No sonographic Murphy's sign is noted.  Common bile duct: Diameter: 5 mm which is within normal limits.  Liver: 14 mm cyst is noted in right hepatic lobe. 13 mm hyperechoic focus is noted in left hepatic lobe medially most consistent with small hemangioma.  IVC: No abnormality visualized.  Pancreas: Visualized portion unremarkable.  Spleen: Size and appearance within normal limits.  Right Kidney: Length: 10.8 cm. Echogenicity within normal limits. 7 mm echogenic focus is noted in right kidney consistent with stable angiomyolipoma.  Left Kidney: Length: 11.4 cm. Echogenicity within normal limits. 6 mm echogenic focus noted in left kidney consistent with angiomyolipoma.  Abdominal aorta: No aneurysm visualized.  Other findings: None.  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  Stable left hepatic cyst. 13 mm hyperechoic focus noted in left hepatic lobe most consistent with hemangioma in the absence of any history of malignancy. Followup ultrasound in 6 months is recommended to ensure  stability. If the patient has a history of malignancy, metastatic disease cannot be excluded and further evaluation with MRI would be recommended at that time.  Cholelithiasis is noted with significant gallbladder wall thickening concerning for acute cholecystitis. Clinical correlation and HIDA scan may be performed further evaluation.   Electronically Signed   By: Marijo Conception,  M.D.   On: 11/28/2014 09:34    Review of Systems  Constitutional: Negative for fever, chills, weight loss, malaise/fatigue and diaphoresis.  HENT: Negative.   Respiratory: Negative.   Cardiovascular: Negative.   Gastrointestinal: Positive for heartburn, nausea and abdominal pain. Negative for vomiting, diarrhea, constipation, blood in stool and melena.  Genitourinary: Negative.   Musculoskeletal: Negative.   Skin: Negative.   Neurological: Negative.  Negative for weakness.  Psychiatric/Behavioral: Negative.   All other systems reviewed and are negative.   Blood pressure 119/74, pulse 68, temperature 98.1 F (36.7 C), temperature source Oral, resp. rate 20, SpO2 98 %. Physical Exam  Constitutional: She is oriented to person, place, and time. She appears well-developed and well-nourished. No distress.  Eyes: Right eye exhibits no discharge. Left eye exhibits no discharge. No scleral icterus.  Cardiovascular: Normal rate, regular rhythm, normal heart sounds and intact distal pulses.  Exam reveals no gallop and no friction rub.   No murmur heard. Respiratory: Effort normal and breath sounds normal. No respiratory distress. She has no wheezes. She has no rales. She exhibits no tenderness.  GI: Soft. Bowel sounds are normal.  TTP epigastric region, RUQ, LUQ, negative murphy's sign  Musculoskeletal: She exhibits no edema or tenderness.  Neurological: She is alert and oriented to person, place, and time.  Skin: Skin is warm and dry. She is not diaphoretic.  Psychiatric: She has a normal mood and affect. Her behavior is normal. Judgment and thought content normal.     Assessment/Plan Gallstone pancreatitis Acute cholecystitis  -admit to inpatient.  Laparoscopic cholecystectomy likely in AM -IV antibiotics -bowel rest, IVF -pain control, antiemetics -repeat labs in AM Hepatic lesion -will need OP follow up/monitoring Heart  palpitations -metoprolol Anxiety/depression -lamictal -PRN ativan -will resume home meds after surgery GERD/PUD -PPI  Darriana Deboy ANP-BC 11/28/2014, 11:12 AM

## 2014-11-28 NOTE — ED Notes (Signed)
PT takes Lamictal at HS and Toprol XL at HS.

## 2014-11-28 NOTE — ED Notes (Signed)
General Surgery at bedside with patient.  Patient is to be NPO!

## 2014-11-29 ENCOUNTER — Encounter (HOSPITAL_COMMUNITY): Payer: Self-pay | Admitting: Critical Care Medicine

## 2014-11-29 ENCOUNTER — Inpatient Hospital Stay (HOSPITAL_COMMUNITY): Payer: 59

## 2014-11-29 ENCOUNTER — Encounter (HOSPITAL_COMMUNITY): Admission: EM | Disposition: A | Discharge status: Home / Self Care | Payer: Self-pay | Source: Home / Self Care

## 2014-11-29 ENCOUNTER — Inpatient Hospital Stay (HOSPITAL_COMMUNITY): Payer: 59 | Admitting: Critical Care Medicine

## 2014-11-29 HISTORY — PX: CHOLECYSTECTOMY: SHX55

## 2014-11-29 LAB — SURGICAL PCR SCREEN
MRSA, PCR: NEGATIVE
Staphylococcus aureus: POSITIVE — AB

## 2014-11-29 LAB — COMPREHENSIVE METABOLIC PANEL
ALT: 169 U/L — AB (ref 0–35)
AST: 153 U/L — ABNORMAL HIGH (ref 0–37)
Albumin: 3 g/dL — ABNORMAL LOW (ref 3.5–5.2)
Alkaline Phosphatase: 60 U/L (ref 39–117)
Anion gap: 3 — ABNORMAL LOW (ref 5–15)
BUN: <5 mg/dL — ABNORMAL LOW (ref 6–23)
CALCIUM: 8.2 mg/dL — AB (ref 8.4–10.5)
CO2: 30 mmol/L (ref 19–32)
Chloride: 112 mmol/L (ref 96–112)
Creatinine, Ser: 0.54 mg/dL (ref 0.50–1.10)
GFR calc non Af Amer: >90 mL/min (ref >90)
Glucose, Bld: 114 mg/dL — ABNORMAL HIGH (ref 70–99)
POTASSIUM: 4.3 mmol/L (ref 3.5–5.1)
SODIUM: 145 mmol/L (ref 135–145)
TOTAL PROTEIN: 5.3 g/dL — AB (ref 6.0–8.3)
Total Bilirubin: 0.6 mg/dL (ref 0.3–1.2)

## 2014-11-29 LAB — CBC
HCT: 38.5 % (ref 36.0–46.0)
HEMOGLOBIN: 12.4 g/dL (ref 12.0–15.0)
MCH: 29.8 pg (ref 26.0–34.0)
MCHC: 32.2 g/dL (ref 30.0–36.0)
MCV: 92.5 fL (ref 78.0–100.0)
Platelets: 207 10*3/uL (ref 150–400)
RBC: 4.16 MIL/uL (ref 3.87–5.11)
RDW: 12.3 % (ref 11.5–15.5)
WBC: 3.5 10*3/uL — ABNORMAL LOW (ref 4.0–10.5)

## 2014-11-29 LAB — LIPASE, BLOOD: Lipase: 35 U/L (ref 11–59)

## 2014-11-29 SURGERY — LAPAROSCOPIC CHOLECYSTECTOMY WITH INTRAOPERATIVE CHOLANGIOGRAM
Anesthesia: General | Site: Abdomen

## 2014-11-29 MED ORDER — HYDROMORPHONE HCL 1 MG/ML IJ SOLN
INTRAMUSCULAR | Status: AC
  Administered 2014-11-29: 0.5 mg via INTRAVENOUS
  Filled 2014-11-29: qty 1

## 2014-11-29 MED ORDER — BUPIVACAINE-EPINEPHRINE (PF) 0.25% -1:200000 IJ SOLN
INTRAMUSCULAR | Status: AC
  Filled 2014-11-29: qty 30

## 2014-11-29 MED ORDER — FENTANYL CITRATE 0.05 MG/ML IJ SOLN
INTRAMUSCULAR | Status: AC
  Filled 2014-11-29: qty 5

## 2014-11-29 MED ORDER — PROPOFOL 10 MG/ML IV BOLUS
INTRAVENOUS | Status: AC
  Filled 2014-11-29: qty 20

## 2014-11-29 MED ORDER — DEXAMETHASONE SODIUM PHOSPHATE 4 MG/ML IJ SOLN
INTRAMUSCULAR | Status: DC | PRN
  Administered 2014-11-29: 4 mg via INTRAVENOUS

## 2014-11-29 MED ORDER — LIDOCAINE HCL (CARDIAC) 20 MG/ML IV SOLN
INTRAVENOUS | Status: DC | PRN
  Administered 2014-11-29: 50 mg via INTRAVENOUS

## 2014-11-29 MED ORDER — SODIUM CHLORIDE 0.9 % IJ SOLN
INTRAMUSCULAR | Status: AC
  Filled 2014-11-29: qty 10

## 2014-11-29 MED ORDER — SODIUM CHLORIDE 0.9 % IV SOLN
INTRAVENOUS | Status: DC | PRN
  Administered 2014-11-29: 5 mL

## 2014-11-29 MED ORDER — MEPERIDINE HCL 25 MG/ML IJ SOLN: 6.2500 mg | INTRAMUSCULAR | Status: DC | PRN

## 2014-11-29 MED ORDER — HYDROMORPHONE HCL 1 MG/ML IJ SOLN
INTRAMUSCULAR | Status: AC
  Filled 2014-11-29: qty 1

## 2014-11-29 MED ORDER — ROCURONIUM BROMIDE 100 MG/10ML IV SOLN
INTRAVENOUS | Status: DC | PRN
  Administered 2014-11-29: 40 mg via INTRAVENOUS

## 2014-11-29 MED ORDER — NEOSTIGMINE METHYLSULFATE 10 MG/10ML IV SOLN
INTRAVENOUS | Status: DC | PRN
  Administered 2014-11-29: 4 mg via INTRAVENOUS

## 2014-11-29 MED ORDER — MIDAZOLAM HCL 5 MG/5ML IJ SOLN
INTRAMUSCULAR | Status: DC | PRN
  Administered 2014-11-29: 2 mg via INTRAVENOUS

## 2014-11-29 MED ORDER — EPHEDRINE SULFATE 50 MG/ML IJ SOLN
INTRAMUSCULAR | Status: DC | PRN
  Administered 2014-11-29: 5 mg via INTRAVENOUS
  Administered 2014-11-29: 10 mg via INTRAVENOUS

## 2014-11-29 MED ORDER — DEXTROSE-NACL 5-0.9 % IV SOLN
INTRAVENOUS | Status: DC
  Administered 2014-11-29 – 2014-11-30 (×3): via INTRAVENOUS

## 2014-11-29 MED ORDER — CHLORHEXIDINE GLUCONATE CLOTH 2 % EX PADS: 6.0000 | MEDICATED_PAD | Freq: Every day | CUTANEOUS | Status: DC

## 2014-11-29 MED ORDER — ROCURONIUM BROMIDE 50 MG/5ML IV SOLN
INTRAVENOUS | Status: AC
  Filled 2014-11-29: qty 1

## 2014-11-29 MED ORDER — DIPHENHYDRAMINE HCL 50 MG/ML IJ SOLN
INTRAMUSCULAR | Status: AC
  Filled 2014-11-29: qty 1

## 2014-11-29 MED ORDER — BUPIVACAINE-EPINEPHRINE 0.25% -1:200000 IJ SOLN
INTRAMUSCULAR | Status: DC | PRN
  Administered 2014-11-29: 18 mL

## 2014-11-29 MED ORDER — PROMETHAZINE HCL 25 MG/ML IJ SOLN
6.2500 mg | INTRAMUSCULAR | Status: DC | PRN
  Administered 2014-11-29: 6.25 mg via INTRAVENOUS

## 2014-11-29 MED ORDER — DEXAMETHASONE SODIUM PHOSPHATE 4 MG/ML IJ SOLN
INTRAMUSCULAR | Status: AC
  Filled 2014-11-29: qty 1

## 2014-11-29 MED ORDER — HYDROCODONE-ACETAMINOPHEN 5-325 MG PO TABS: 1.0000 | ORAL_TABLET | ORAL | Status: DC | PRN

## 2014-11-29 MED ORDER — 0.9 % SODIUM CHLORIDE (POUR BTL) OPTIME
TOPICAL | Status: DC | PRN
  Administered 2014-11-29: 1000 mL

## 2014-11-29 MED ORDER — MIDAZOLAM HCL 2 MG/2ML IJ SOLN
INTRAMUSCULAR | Status: AC
  Filled 2014-11-29: qty 2

## 2014-11-29 MED ORDER — MUPIROCIN 2 % EX OINT
1.0000 "application " | TOPICAL_OINTMENT | Freq: Two times a day (BID) | CUTANEOUS | Status: DC
  Administered 2014-11-29 – 2014-11-30 (×2): 1 via NASAL
  Filled 2014-11-29: qty 22

## 2014-11-29 MED ORDER — PROPOFOL 10 MG/ML IV BOLUS
INTRAVENOUS | Status: DC | PRN
  Administered 2014-11-29: 160 mg via INTRAVENOUS

## 2014-11-29 MED ORDER — HYDROMORPHONE HCL 1 MG/ML IJ SOLN
0.2500 mg | INTRAMUSCULAR | Status: DC | PRN
  Administered 2014-11-29: 0.5 mg via INTRAVENOUS
  Administered 2014-11-29: 1 mg via INTRAVENOUS
  Administered 2014-11-29: 0.5 mg via INTRAVENOUS

## 2014-11-29 MED ORDER — EPHEDRINE SULFATE 50 MG/ML IJ SOLN
INTRAMUSCULAR | Status: AC
  Filled 2014-11-29: qty 1

## 2014-11-29 MED ORDER — GLYCOPYRROLATE 0.2 MG/ML IJ SOLN
INTRAMUSCULAR | Status: DC | PRN
  Administered 2014-11-29: 0.6 mg via INTRAVENOUS
  Administered 2014-11-29: 0.1 mg via INTRAVENOUS

## 2014-11-29 MED ORDER — PROMETHAZINE HCL 25 MG/ML IJ SOLN
INTRAMUSCULAR | Status: AC
  Administered 2014-11-29: 6.25 mg via INTRAVENOUS
  Filled 2014-11-29: qty 1

## 2014-11-29 MED ORDER — LACTATED RINGERS IV SOLN
INTRAVENOUS | Status: DC
  Administered 2014-11-29: 09:00:00 via INTRAVENOUS

## 2014-11-29 MED ORDER — FENTANYL CITRATE 0.05 MG/ML IJ SOLN
INTRAMUSCULAR | Status: DC | PRN
  Administered 2014-11-29 (×2): 25 ug via INTRAVENOUS
  Administered 2014-11-29: 50 ug via INTRAVENOUS
  Administered 2014-11-29: 100 ug via INTRAVENOUS
  Administered 2014-11-29: 50 ug via INTRAVENOUS

## 2014-11-29 MED ORDER — DIPHENHYDRAMINE HCL 50 MG/ML IJ SOLN
INTRAMUSCULAR | Status: DC | PRN
  Administered 2014-11-29: 12.5 mg via INTRAVENOUS

## 2014-11-29 MED ORDER — LIDOCAINE HCL (CARDIAC) 20 MG/ML IV SOLN
INTRAVENOUS | Status: AC
  Filled 2014-11-29: qty 5

## 2014-11-29 MED ORDER — LORAZEPAM 2 MG/ML IJ SOLN
INTRAMUSCULAR | Status: AC
  Administered 2014-11-29: 1 mg
  Filled 2014-11-29: qty 1

## 2014-11-29 SURGICAL SUPPLY — 49 items
APPLIER CLIP 5 13 M/L LIGAMAX5 (MISCELLANEOUS) ×3
APPLIER CLIP ROT 10 11.4 M/L (STAPLE)
APR CLP MED LRG 11.4X10 (STAPLE)
APR CLP MED LRG 5 ANG JAW (MISCELLANEOUS) ×1
BAG SPEC RTRVL LRG 6X4 10 (ENDOMECHANICALS)
BLADE SURG ROTATE 9660 (MISCELLANEOUS) IMPLANT
CANISTER SUCTION 2500CC (MISCELLANEOUS) ×3 IMPLANT
CHLORAPREP W/TINT 26ML (MISCELLANEOUS) ×3 IMPLANT
CLIP APPLIE 5 13 M/L LIGAMAX5 (MISCELLANEOUS) ×1 IMPLANT
CLIP APPLIE ROT 10 11.4 M/L (STAPLE) IMPLANT
CLOSURE WOUND 1/2 X4 (GAUZE/BANDAGES/DRESSINGS) ×1
COVER MAYO STAND STRL (DRAPES) ×3 IMPLANT
COVER SURGICAL LIGHT HANDLE (MISCELLANEOUS) ×3 IMPLANT
DRAPE C-ARM 42X72 X-RAY (DRAPES) ×3 IMPLANT
DRAPE LAPAROSCOPIC ABDOMINAL (DRAPES) ×3 IMPLANT
DRSG TEGADERM 2-3/8X2-3/4 SM (GAUZE/BANDAGES/DRESSINGS) ×12 IMPLANT
ELECT REM PT RETURN 9FT ADLT (ELECTROSURGICAL) ×3
ELECTRODE REM PT RTRN 9FT ADLT (ELECTROSURGICAL) ×1 IMPLANT
GLOVE BIOGEL PI IND STRL 7.0 (GLOVE) ×1 IMPLANT
GLOVE BIOGEL PI IND STRL 7.5 (GLOVE) ×1 IMPLANT
GLOVE BIOGEL PI IND STRL 8 (GLOVE) ×1 IMPLANT
GLOVE BIOGEL PI INDICATOR 7.0 (GLOVE) ×2
GLOVE BIOGEL PI INDICATOR 7.5 (GLOVE) ×2
GLOVE BIOGEL PI INDICATOR 8 (GLOVE) ×2
GLOVE ECLIPSE 7.5 STRL STRAW (GLOVE) ×3 IMPLANT
GLOVE SURG SS PI 7.0 STRL IVOR (GLOVE) ×3 IMPLANT
GLOVE SURG SS PI 7.5 STRL IVOR (GLOVE) ×3 IMPLANT
GOWN STRL REUS W/ TWL LRG LVL3 (GOWN DISPOSABLE) ×3 IMPLANT
GOWN STRL REUS W/TWL LRG LVL3 (GOWN DISPOSABLE) ×9
KIT BASIN OR (CUSTOM PROCEDURE TRAY) ×3 IMPLANT
KIT ROOM TURNOVER OR (KITS) ×3 IMPLANT
LIQUID BAND (GAUZE/BANDAGES/DRESSINGS) ×3 IMPLANT
NS IRRIG 1000ML POUR BTL (IV SOLUTION) ×3 IMPLANT
PAD ARMBOARD 7.5X6 YLW CONV (MISCELLANEOUS) ×3 IMPLANT
POUCH SPECIMEN RETRIEVAL 10MM (ENDOMECHANICALS) IMPLANT
SCISSORS LAP 5X35 DISP (ENDOMECHANICALS) ×3 IMPLANT
SET CHOLANGIOGRAPH 5 50 .035 (SET/KITS/TRAYS/PACK) ×3 IMPLANT
SET IRRIG TUBING LAPAROSCOPIC (IRRIGATION / IRRIGATOR) ×3 IMPLANT
SLEEVE ENDOPATH XCEL 5M (ENDOMECHANICALS) ×6 IMPLANT
SPECIMEN JAR SMALL (MISCELLANEOUS) ×3 IMPLANT
STRIP CLOSURE SKIN 1/2X4 (GAUZE/BANDAGES/DRESSINGS) ×2 IMPLANT
SUT MNCRL AB 4-0 PS2 18 (SUTURE) ×3 IMPLANT
TOWEL OR 17X24 6PK STRL BLUE (TOWEL DISPOSABLE) ×3 IMPLANT
TOWEL OR 17X26 10 PK STRL BLUE (TOWEL DISPOSABLE) IMPLANT
TRAY LAPAROSCOPIC (CUSTOM PROCEDURE TRAY) ×3 IMPLANT
TROCAR XCEL BLUNT TIP 100MML (ENDOMECHANICALS) ×3 IMPLANT
TROCAR XCEL NON-BLD 11X100MML (ENDOMECHANICALS) IMPLANT
TROCAR XCEL NON-BLD 5MMX100MML (ENDOMECHANICALS) ×3 IMPLANT
TUBING INSUFFLATION (TUBING) ×3 IMPLANT

## 2014-11-29 NOTE — Discharge Instructions (Signed)

## 2014-11-29 NOTE — Anesthesia Procedure Notes (Signed)
Procedure Name: Intubation Date/Time: 11/29/2014 9:41 AM Performed by: Carola Frost Pre-anesthesia Checklist: Patient identified, Emergency Drugs available, Timeout performed, Suction available and Patient being monitored Patient Re-evaluated:Patient Re-evaluated prior to inductionOxygen Delivery Method: Circle system utilized Preoxygenation: Pre-oxygenation with 100% oxygen Intubation Type: IV induction Ventilation: Mask ventilation without difficulty Laryngoscope Size: Mac and 3 Grade View: Grade I Tube type: Oral Tube size: 7.0 mm Number of attempts: 1 Airway Equipment and Method: Stylet Placement Confirmation: ETT inserted through vocal cords under direct vision,  positive ETCO2,  CO2 detector and breath sounds checked- equal and bilateral Secured at: 21 cm Tube secured with: Tape Dental Injury: Teeth and Oropharynx as per pre-operative assessment

## 2014-11-29 NOTE — Anesthesia Preprocedure Evaluation (Addendum)
Anesthesia Evaluation  Patient identified by MRN, date of birth, ID band Patient awake    Reviewed: Allergy & Precautions, NPO status , Patient's Chart, lab work & pertinent test results, reviewed documented beta blocker date and time   Airway Mallampati: II  TM Distance: >3 FB Neck ROM: Full    Dental no notable dental hx. (+) Dental Advisory Given   Pulmonary former smoker,  breath sounds clear to auscultation  Pulmonary exam normal       Cardiovascular + Peripheral Vascular Disease + dysrhythmias Supra Ventricular Tachycardia + Valvular Problems/Murmurs MVP Rhythm:Regular Rate:Normal     Neuro/Psych  Headaches, Anxiety Depression    GI/Hepatic hiatal hernia, PUD, GERD-  Medicated,  Endo/Other  Morbid obesity  Renal/GU      Musculoskeletal  (+) Arthritis -,   Abdominal (+) + obese,   Peds  Hematology   Anesthesia Other Findings   Reproductive/Obstetrics                           Anesthesia Physical Anesthesia Plan  ASA: II  Anesthesia Plan: General   Post-op Pain Management:    Induction: Intravenous  Airway Management Planned: Oral ETT  Additional Equipment:   Intra-op Plan:   Post-operative Plan: Extubation in OR  Informed Consent: I have reviewed the patients History and Physical, chart, labs and discussed the procedure including the risks, benefits and alternatives for the proposed anesthesia with the patient or authorized representative who has indicated his/her understanding and acceptance.   Dental advisory given  Plan Discussed with: CRNA  Anesthesia Plan Comments:       Anesthesia Quick Evaluation

## 2014-11-29 NOTE — Transfer of Care (Signed)
Immediate Anesthesia Transfer of Care Note  Patient: Tamara Cross  Procedure(s) Performed: Procedure(s): LAPAROSCOPIC CHOLECYSTECTOMY WITH INTRAOPERATIVE CHOLANGIOGRAM (N/A)  Patient Location: PACU  Anesthesia Type:General  Level of Consciousness: awake, alert  and oriented  Airway & Oxygen Therapy: Patient Spontanous Breathing and Patient connected to nasal cannula oxygen  Post-op Assessment: Report given to RN, Post -op Vital signs reviewed and stable and Patient moving all extremities X 4  Post vital signs: Reviewed and stable  Last Vitals:  Filed Vitals:   11/29/14 0859  BP:   Pulse: 68  Temp:   Resp:     Complications: No apparent anesthesia complications

## 2014-11-29 NOTE — Op Note (Addendum)
OPERATIVE REPORT  DATE OF OPERATION:  11/29/2014  PATIENT:  Tamara Cross  49 y.o. female  PRE-OPERATIVE DIAGNOSIS:  Gallstones  POST-OPERATIVE DIAGNOSIS:  Gallstones  PROCEDURE:  Procedure(s): LAPAROSCOPIC CHOLECYSTECTOMY WITH INTRAOPERATIVE CHOLANGIOGRAM  SURGEON:  Surgeon(s): Doreen Salvage, MD  ASSISTANT: Dort, PA-C  ANESTHESIA:   general  EBL: <20 ml  BLOOD ADMINISTERED: none  DRAINS: none   SPECIMEN:  Source of Specimen:  Gallbladder ans stones  COUNTS CORRECT:  YES  PROCEDURE DETAILS: The patient was taken to the operating room and placed on the table in the supine position.  After an adequate endotracheal anesthetic was administered, the patient was prepped with ChloroPrep, and then draped in the usual manner exposing the entire abdomen laterally, inferiorly and up  to the costal margins.  After a proper timeout was performed including identifying the patient and the procedure to be performed, a supraumbilical 0.5LZ midline incision was made using a #15 blade.  This was taken down to the fascia which was then incised with a #15 blade.  The edges of the fascia were tented up with Kocher clamps as the preperitoneal space was penetrated with a Kelly clamp into the peritoneum.  Once this was done, a pursestring suture of 0 Vicryl was passed around the fascial opening.  This was subsequently used to secure the [facility] cannula which was passed into the peritoneal cavity.  Once the [facility] cannula was in place, carbon dioxide gas was insufflated into the peritoneal cavity up to a maximal intra-abdominal pressure of 62mm Hg.The laparoscope, with attached camera and light source, was passed into the peritoneal cavity to visualize the direct insertion of two right upper quadrant 60mm cannulas, and a sup-xiphoid 94mm cannula.  Once all cannulas were in place, the dissection was begun.  Two ratcheted graspers were attached to the dome and infundibulum of the gallbladder and retracted towards  the anterior abdominal wall and the right upper quadrant.  Using cautery attached to a dissecting forceps, the peritoneum overlaying the triangle of Chalot and the hepatoduodenal triangle was dissected away exposing the cystic duct and the cystic artery.  The cystic artery was clipped proximally and distally then transected.  A clip was placed on the gallbladder side of the cystic duct, then a cholecystodochotomy made using the laparoscopic scissors.  Through the cholecystodochotomy a Cook catheter was passed to performed a cholangiogram.  The cholangiogram showed initially mildly dilated CBD, no intraductal filling defects, flow into the duodenum, and good proximal filling..  Once the cholangiogram was completed, the [facility] catheter was removed, and the distal cystic duct was clipped multiple times then transected.  The gallbladder was then dissected out of the hepatic bed without event.  It was retrieved from the abdomen without event.  The gallbladder wall was edematous as was the gallbladder bed.  Once the gallbladder was removed, the bed was inspected for hemostasis.  Once excellent hemostasis was obtained all gas and fluids were aspirated from above the liver, then the cannulas were removed.  The supraumbilical incision was closed using the pursestring suture which was in place.  0.25% bupivicaine with epinephrine was injected at all sites.  All 75mm or greater cannula sites were close using a running subcuticular stitch of 4-0 Monocryl.  5.28mm cannula sites were closed with Dermabond only.Steri-Strips and Tagaderm were used to complete the dressings at all sites.  At this point all needle, sponge, and instrument counts were correct.The patient was awakened from anesthesia and taken to the PACU in stable condition.  PATIENT DISPOSITION:  PACU - hemodynamically stable.          Tamara Cross, JAY 2/23/201610:54 AM

## 2014-11-29 NOTE — Anesthesia Postprocedure Evaluation (Signed)
Anesthesia Post Note  Patient: FRANCESKA STRAHM  Procedure(s) Performed: Procedure(s) (LRB): LAPAROSCOPIC CHOLECYSTECTOMY WITH INTRAOPERATIVE CHOLANGIOGRAM (N/A)  Anesthesia type: General  Patient location: PACU  Post pain: Pain level controlled  Post assessment: Post-op Vital signs reviewed  Last Vitals: BP 133/73 mmHg  Pulse 77  Temp(Src) 37.1 C (Oral)  Resp 16  SpO2 99%  Post vital signs: Reviewed  Level of consciousness: sedated  Complications: No apparent anesthesia complications

## 2014-11-30 NOTE — Discharge Summary (Signed)
Patient ID: Tamara Cross MRN: [id number] DOB/AGE: [birth year] 51 y.o.  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 Lucia Gaskins, anxiety/depression, palpitations followed by Dr. Caryl Comes, chronic abdominal pain followed by Dr. Olevia Perches, migraines presenting with epigastric abdominal pain. Duration of symptoms is 2 weeks, last episode began suddenly at 3AM. Location is the epigastric region band like and radiates to sides and back. Severe in severity. Characterized as doubling over pain. Associated with nausea. She denies vomiting, fever or chills. No aggravating factors. No alleviating factors. Modifying factors include; tums, protonix and phenergan. She had pizza and wings for dinner last night. She reports 12-13lb weight loss over the last 2 months, unintentional. She denies constipation, reports diarrhea. She has a strong family history of colon CA, negative colonoscopy and endoscopy in December of 2015. Her work up shows a normal white count, elevated AST and ALT with a normal total bilirubin, lipase 146. Abdominal US showed chololithiasis, GB wall thickening, 52mm hyperchoic focus in the left hepatic lobe. We have therefore been asked to evaluate the patient.   Admission Diagnoses:  1. Gallstone pancreatitis 2. Acute cholecystitis 3. hepatic lesion  Hospital Course: The patient was admitted and started on IV abx therapy and bowel rest due to a slightly elevated lipase.  The following day, she was feeling better.  She was taken to the OR where she underwent the above procedure.  She tolerated this well.  On POd 1, she was tolerating a solid diet and her pain was well controlled.  She was felt stable for dc home.  PE: Abd: soft, appropriately tender, +BS, Nd, incisions c/d/i, small ecchymosis around  umbilicus  Discharge Diagnoses:  Active Problems:   Gallstone pancreatitis, s/p lap chole with IOC hepatic lesion  Discharge Medications:   Medication List    TAKE these medications        acetaminophen 500 MG tablet  Commonly known as:  TYLENOL  Take 1,000 mg by mouth daily as needed for mild pain.     clonazePAM 0.5 MG tablet  Commonly known as:  KLONOPIN  Take 0.5 mg by mouth at bedtime as needed for anxiety.     desvenlafaxine 100 MG 24 hr tablet  Commonly known as:  PRISTIQ  Take 100 mg by mouth daily.     HYDROcodone-acetaminophen 5-325 MG per tablet  Commonly known as:  NORCO/VICODIN  Take 1-2 tablets by mouth every 4 (four) hours as needed for moderate pain.     lamoTRIgine 100 MG tablet  Commonly known as:  LAMICTAL  Take 100-200 mg by mouth 2 (two) times daily. Takes 200mg  every morning and 100mg  every night at bedtime.     metoprolol succinate 50 MG 24 hr tablet  Commonly known as:  TOPROL-XL  Take 1 tablet (50 mg total) by mouth daily with or  immediately following a  meal.     pantoprazole 40 MG tablet  Commonly known as:  PROTONIX  Take 1 tablet (40 mg total) by mouth daily.     promethazine 25 MG tablet  Commonly known as:  PHENERGAN  Take 1 tablet (25 mg total) by mouth every 8 (eight) hours as needed for nausea or vomiting.     tamoxifen 20 MG tablet  Commonly known as:  NOLVADEX  Take 1 tablet (20 mg total) by mouth  daily.     traZODone 100 MG tablet  Commonly known as:  DESYREL  Take 100 mg by mouth at bedtime.     valACYclovir 500 MG tablet  Commonly known as:  VALTREX  Take 500 mg by mouth daily as needed (for breakouts).     VYVANSE 50 MG capsule  Generic drug:  lisdexamfetamine  Take 50 mg by mouth daily.        Discharge Instructions: Follow-up Information    Follow up with [facility] On 12/20/2014.   Why:  arrive by 2:15PM for a 2:45PM post op check   Contact information:   [place] [id number] [phone]      Follow up with Walker Kehr, MD.   Specialty:  Internal Medicine   Why:  you had a small liver lesion on CT scan.  Your PCP will need to follow this as an outpatient   Contact information:   [place] [address] [phone]       Signed: Henreitta Cea 11/30/2014, 9:24 AM

## 2014-12-01 ENCOUNTER — Encounter (HOSPITAL_COMMUNITY): Payer: Self-pay | Admitting: General Surgery

## 2014-12-09 ENCOUNTER — Telehealth: Payer: Self-pay | Admitting: Internal Medicine

## 2014-12-09 ENCOUNTER — Inpatient Hospital Stay: Payer: 59 | Admitting: Internal Medicine

## 2014-12-09 NOTE — Telephone Encounter (Signed)
Patient was a no show. Ok to reschedule?

## 2014-12-10 NOTE — Telephone Encounter (Signed)
Ok Thx 

## 2014-12-15 ENCOUNTER — Telehealth: Payer: Self-pay | Admitting: Internal Medicine

## 2014-12-15 NOTE — Telephone Encounter (Signed)
Spoke with patient and she is having right sided abdominal pain and chills with low grade fever. She has called the surgeon and is waiting for a call back. Requested she discuss this with surgeon first and call us back if needed.

## 2014-12-23 ENCOUNTER — Inpatient Hospital Stay: Payer: 59 | Admitting: Internal Medicine

## 2015-01-03 ENCOUNTER — Other Ambulatory Visit (INDEPENDENT_AMBULATORY_CARE_PROVIDER_SITE_OTHER): Payer: 59

## 2015-01-03 ENCOUNTER — Ambulatory Visit (INDEPENDENT_AMBULATORY_CARE_PROVIDER_SITE_OTHER): Payer: 59 | Admitting: Internal Medicine

## 2015-01-03 ENCOUNTER — Encounter: Payer: Self-pay | Admitting: Internal Medicine

## 2015-01-03 VITALS — BP 118/78 | HR 77 | Temp 98.3°F | Wt 188.2 lb

## 2015-01-03 DIAGNOSIS — K7689 Other specified diseases of liver: Secondary | ICD-10-CM

## 2015-01-03 DIAGNOSIS — K769 Liver disease, unspecified: Secondary | ICD-10-CM

## 2015-01-03 DIAGNOSIS — K859 Acute pancreatitis, unspecified: Secondary | ICD-10-CM | POA: Diagnosis not present

## 2015-01-03 DIAGNOSIS — K8020 Calculus of gallbladder without cholecystitis without obstruction: Secondary | ICD-10-CM | POA: Diagnosis not present

## 2015-01-03 DIAGNOSIS — K851 Biliary acute pancreatitis without necrosis or infection: Secondary | ICD-10-CM

## 2015-01-03 LAB — HEPATIC FUNCTION PANEL
ALK PHOS: 58 U/L (ref 39–117)
ALT: 15 U/L (ref 0–35)
AST: 19 U/L (ref 0–37)
Albumin: 4 g/dL (ref 3.5–5.2)
BILIRUBIN DIRECT: 0.1 mg/dL (ref 0.0–0.3)
Total Bilirubin: 0.4 mg/dL (ref 0.2–1.2)
Total Protein: 6.6 g/dL (ref 6.0–8.3)

## 2015-01-03 LAB — CBC WITH DIFFERENTIAL/PLATELET
BASOS ABS: 0 10*3/uL (ref 0.0–0.1)
Basophils Relative: 0.6 % (ref 0.0–3.0)
Eosinophils Absolute: 0.1 10*3/uL (ref 0.0–0.7)
Eosinophils Relative: 1.2 % (ref 0.0–5.0)
HEMATOCRIT: 40.7 % (ref 36.0–46.0)
Hemoglobin: 13.8 g/dL (ref 12.0–15.0)
Lymphocytes Relative: 21.6 % (ref 12.0–46.0)
Lymphs Abs: 1.3 10*3/uL (ref 0.7–4.0)
MCHC: 33.8 g/dL (ref 30.0–36.0)
MCV: 89.1 fl (ref 78.0–100.0)
MONOS PCT: 4.9 % (ref 3.0–12.0)
Monocytes Absolute: 0.3 10*3/uL (ref 0.1–1.0)
Neutro Abs: 4.3 10*3/uL (ref 1.4–7.7)
Neutrophils Relative %: 71.7 % (ref 43.0–77.0)
Platelets: 225 10*3/uL (ref 150.0–400.0)
RBC: 4.57 Mil/uL (ref 3.87–5.11)
RDW: 13.6 % (ref 11.5–15.5)
WBC: 6.1 10*3/uL (ref 4.0–10.5)

## 2015-01-03 LAB — BASIC METABOLIC PANEL
BUN: 13 mg/dL (ref 6–23)
CALCIUM: 9 mg/dL (ref 8.4–10.5)
CO2: 33 meq/L — AB (ref 19–32)
Chloride: 102 mEq/L (ref 96–112)
Creatinine, Ser: 0.68 mg/dL (ref 0.40–1.20)
GFR: 97.01 mL/min (ref >60.00)
Glucose, Bld: 95 mg/dL (ref 70–99)
Potassium: 4.2 mEq/L (ref 3.5–5.1)
SODIUM: 139 meq/L (ref 135–145)

## 2015-01-03 LAB — LIPASE: Lipase: 21 U/L (ref 11.0–59.0)

## 2015-01-03 NOTE — Assessment & Plan Note (Signed)
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  Stable left hepatic cyst. 13 mm hyperechoic focus noted in left hepatic lobe most consistent with hemangioma in the absence of any history of malignancy. Followup ultrasound in 6 months is recommended to ensure stability. If the patient has a history of malignancy, metastatic disease cannot be excluded and further evaluation with MRI would be recommended at that time.  Cholelithiasis is noted with significant gallbladder wall thickening concerning for acute cholecystitis. Clinical correlation and HIDA scan may be performed further evaluation.   Electronically Signed  By: Marijo Conception, M.D.  On: 11/28/2014 09:34  Discussed w/pt - will get an MRI in the view of her breast ca hx.

## 2015-01-03 NOTE — Progress Notes (Signed)
Pre visit review using our clinic review tool, if applicable. No additional management support is needed unless otherwise documented below in the visit note. 

## 2015-01-03 NOTE — Assessment & Plan Note (Signed)
2/16 Dr Hulen Skains s/p lap chole

## 2015-01-03 NOTE — Progress Notes (Signed)
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 Lucia Gaskins, anxiety/depression, palpitations followed by Dr. Caryl Comes, chronic abdominal pain followed by Dr. Olevia Perches, migraines presenting with epigastric abdominal pain. Duration of symptoms is 2 weeks, last episode began suddenly at 3AM. Location is the epigastric region band like and radiates to sides and back. Severe in severity. Characterized as doubling over pain. Associated with nausea. She denies vomiting, fever or chills. No aggravating factors. No alleviating factors. Modifying factors include; tums, protonix and phenergan. She had pizza and wings for dinner last night. She reports 12-13lb weight loss over the last 2 months, unintentional. She denies constipation, reports diarrhea. She has a strong family history of colon CA, negative colonoscopy and endoscopy in December of 2015. Her work up shows a normal white count, elevated AST and ALT with a normal total bilirubin, lipase 146. Abdominal US showed chololithiasis, GB wall thickening, 75mm hyperchoic focus in the left hepatic lobe. We have therefore been asked to evaluate the patient.   Admission Diagnoses:  1. Gallstone pancreatitis 2. Acute cholecystitis 3. hepatic lesion  Hospital Course: The patient was admitted and started on IV abx therapy and bowel rest due to a slightly elevated lipase. The following day, she was feeling better. She was taken to the OR where she underwent the above procedure. She tolerated this well. On POd 1, she was tolerating a solid diet and her pain was well controlled. She was felt stable for dc home.  PE: Abd: soft, appropriately  tender, +BS, Nd, incisions c/d/i, small ecchymosis around umbilicus  Discharge Diagnoses:  Active Problems:  Gallstone pancreatitis, s/p lap chole with IOC hepatic lesion  The patient is here to follow up on chronic anxiety, headaches and chronic IBS-c symptoms, recent dx of breast cancer (R) 5/15.     Wt Readings from Last 3 Encounters:  01/03/15 188 lb 4 oz (85.39 kg)  11/15/14 187 lb 9.6 oz (85.095 kg)  10/17/14 189 lb 6.4 oz (85.911 kg)   BP Readings from Last 3 Encounters:  01/03/15 118/78  11/30/14 104/58  11/15/14 100/78      Review of Systems  Constitutional: Negative for chills, activity change, appetite change, fatigue and unexpected weight change.  HENT: Negative for congestion, ear pain, mouth sores, sinus pressure, sore throat and tinnitus.   Eyes: Negative for visual disturbance.  Respiratory: Negative for cough and chest tightness.   Cardiovascular: Negative for leg swelling.  Gastrointestinal: Positive for constipation. Negative for nausea and diarrhea.  Genitourinary: Negative for urgency, frequency, flank pain, vaginal discharge, difficulty urinating and vaginal pain.  Musculoskeletal: Positive for neck pain. Negative for gait problem.  Skin: Negative for pallor, rash and wound.  Neurological: Negative for dizziness, tremors and syncope.  Hematological: Negative for adenopathy. Does not bruise/bleed easily.  Psychiatric/Behavioral: Negative for suicidal ideas, confusion, sleep disturbance, dysphoric mood and agitation. The patient is nervous/anxious.   A grape size lipoma R lat chest - lower ribs R shoulder is tender w/ROM B hips are tender w/pressure over troch major  Wt Readings from Last 3 Encounters:  01/03/15 188 lb 4 oz (85.39 kg)  11/15/14 187 lb 9.6 oz (85.095 kg)  10/17/14 189 lb 6.4  oz (85.911 kg)   BP Readings from Last 3 Encounters:  01/03/15 118/78  11/30/14 104/58  11/15/14 100/78        Objective:   Physical Exam  Lab Results   Component Value Date   WBC 3.5* 11/29/2014   HGB 12.4 11/29/2014   HCT 38.5 11/29/2014   PLT 207 11/29/2014   GLUCOSE 114* 11/29/2014   CHOL 178 08/23/2008   TRIG 47 08/23/2008   HDL 52.7 08/23/2008   LDLCALC 116* 08/23/2008   ALT 169* 11/29/2014   AST 153* 11/29/2014   NA 145 11/29/2014   K 4.3 11/29/2014   CL 112 11/29/2014   CREATININE 0.54 11/29/2014   BUN <5* 11/29/2014   CO2 30 11/29/2014   TSH 2.57 04/26/2014   [place] 11/28/14 IMPRESSION: 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  Stable left hepatic cyst. 13 mm hyperechoic focus noted in left hepatic lobe most consistent with hemangioma in the absence of any history of malignancy. Followup ultrasound in 6 months is recommended to ensure stability. If the patient has a history of malignancy, metastatic disease cannot be excluded and further evaluation with MRI would be recommended at that time.  Cholelithiasis is noted with significant gallbladder wall thickening concerning for acute cholecystitis. Clinical correlation and HIDA scan may be performed further evaluation.   Electronically Signed  By: Marijo Conception, M.D.  On: 11/28/2014 09:34      Assessment & Plan:

## 2015-01-27 ENCOUNTER — Other Ambulatory Visit: Payer: Self-pay | Admitting: Internal Medicine

## 2015-01-30 ENCOUNTER — Other Ambulatory Visit: Payer: Self-pay

## 2015-01-30 MED ORDER — PANTOPRAZOLE SODIUM 40 MG PO TBEC: 40.0000 mg | DELAYED_RELEASE_TABLET | Freq: Every day | ORAL | Status: DC

## 2015-02-01 ENCOUNTER — Telehealth: Payer: Self-pay

## 2015-02-01 NOTE — Telephone Encounter (Signed)
Tramadol refill requested. I don't see an active rx for this to pend.   Please advise.

## 2015-02-02 MED ORDER — TRAMADOL HCL 50 MG PO TABS: 50.0000 mg | ORAL_TABLET | Freq: Two times a day (BID) | ORAL | Status: DC | PRN

## 2015-02-02 NOTE — Telephone Encounter (Signed)
The rx requested stated tramadol. I checked it twice when I saw that tramadol was not an active med.

## 2015-02-02 NOTE — Telephone Encounter (Signed)
Tramadol or trazadone? Thx

## 2015-02-02 NOTE — Telephone Encounter (Signed)
Ok Thx 

## 2015-02-06 ENCOUNTER — Inpatient Hospital Stay: Admission: RE | Admit: 2015-02-06 | Payer: Self-pay | Source: Ambulatory Visit

## 2015-02-20 ENCOUNTER — Inpatient Hospital Stay: Admission: RE | Admit: 2015-02-20 | Payer: Self-pay | Source: Ambulatory Visit

## 2015-03-07 ENCOUNTER — Inpatient Hospital Stay: Admission: RE | Admit: 2015-03-07 | Payer: Self-pay | Source: Ambulatory Visit

## 2015-04-12 ENCOUNTER — Encounter: Payer: Self-pay | Admitting: Internal Medicine

## 2015-04-20 ENCOUNTER — Telehealth: Payer: Self-pay | Admitting: Hematology and Oncology

## 2015-04-20 ENCOUNTER — Encounter: Payer: Self-pay | Admitting: Hematology and Oncology

## 2015-04-20 ENCOUNTER — Ambulatory Visit (HOSPITAL_BASED_OUTPATIENT_CLINIC_OR_DEPARTMENT_OTHER): Payer: 59 | Admitting: Hematology and Oncology

## 2015-04-20 VITALS — BP 145/80 | HR 82 | Temp 98.5°F | Resp 18 | Ht 64.0 in | Wt 191.6 lb

## 2015-04-20 DIAGNOSIS — C50411 Malignant neoplasm of upper-outer quadrant of right female breast: Secondary | ICD-10-CM

## 2015-04-20 NOTE — Assessment & Plan Note (Addendum)
1. 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  2. LCIS and atypical lobular hyperplasia in the lumpectomy specimen from the left breast.  Tamoxifen toxicities: 1. Severe hot flashes: these are so severe that they are affecting her life. We discussed different options including switching her to anastrozole and start her last menstrual period was 3 years ago. But we elected to decrease the dosage of tamoxifen to 10 mg daily. We will see if she tolerates this any better.we also discussed about adding Effexor. But she does not want to take another medication to contract the side effects of one medication. 2. Muscle aches and pains and cramps especially at night  Ataxia: This is been going on for the past 2-3 months. She has fallen a few times already. I will obtain a brain MRI with and without contrast. Hypoechoic liver lesion: This was detected on ultrasound of the abdomen in February 2016. Liver MRI will be ordered for further evaluation.   Return to clinic in 1 month for follow-up.

## 2015-04-20 NOTE — Telephone Encounter (Signed)
Gave avs & calendar for July/August. °

## 2015-04-20 NOTE — Progress Notes (Signed)
Patient Care Team: Cassandria Anger, MD as PCP - General (Internal Medicine) Olga Millers, MD as Consulting Physician (Obstetrics and Gynecology) Lafayette Dragon, MD as Consulting Physician (Gastroenterology) Alphonsa Overall, MD as Consulting Physician (General Surgery) Eppie Gibson, MD as Consulting Physician (Radiation Oncology) Nicholas Lose, MD as Consulting Physician (Hematology and Oncology)  DIAGNOSIS: No matching staging information was found for the patient.  SUMMARY OF ONCOLOGIC HISTORY:   Malignant neoplasm of upper-outer quadrant of right female breast   03/28/2014 Initial Diagnosis Malignant neoplasm of upper-outer quadrant of female breast: Right breast DCIS, left breast LCIS/ALH   04/14/2014 Initial Biopsy Right breast core biopsy negative microcalcifications seen. Left breast 12:00 lobular neoplasia atypical lobular hyperplasia and LCIS: Left breast 9:00 fibroadenoma   05/02/2014 Surgery Lumpectomy right breast DCIS 1 cm margins clear: ER 100% PR 65% intermediate grade plus ALH; 3 lumpectomies in the left breast showing LCIS+ [facility]   06/20/2014 - 08/04/2014 Radiation Therapy Adjuvant radiation therapy   09/16/2014 -  Anti-estrogen oral therapy Tamoxifen 20 mg daily plans for 5 years    CHIEF COMPLIANT: severe hot flashes and ataxia  INTERVAL HISTORY: Tamara Cross is a 49 year old with above-mentioned history of DCIS for which she has been on oral antiestrogen therapy since December 2015. She had a rough time tolerating tamoxifen. She has profound hot flashes that keep her branching throughout the day and night making her life miserable. She cannot sleep through the night because of muscle cramps and aches. Previously had offered her Effexor for treatment of hot flashes but she did not want to take another medication. She took herself off for 3-4 weeks and felt remarkably well. In addition to this, she has been feeling unsteady in her gait and had fallen 2 or 3 times. Especially when  she stands up she sways to the side. For the past month her left ear has been bothering her. She denies any vertigo or tinnitus. In February 2016 she had an ultrasound of the liver which showed a hypoechoic density in the liver. She was recommended to undergo a liver MRI but that has not been done so far.  REVIEW OF SYSTEMS:   Constitutional: Denies fevers, chills or abnormal weight loss, profound hot flashes Eyes: Denies blurriness of vision Ears, nose, mouth, throat, and face: Denies mucositis or sore throat Respiratory: Denies cough, dyspnea or wheezes Cardiovascular: Denies palpitation, chest discomfort or lower extremity swelling Gastrointestinal:  Denies nausea, heartburn or change in bowel habits Skin: Denies abnormal skin rashes Lymphatics: Denies new lymphadenopathy or easy bruising Neurological:Denies numbness, tingling or new weaknesses, complains of unsteadiness and the gait and falling down a few times. Behavioral/Psych: Mood is stable, no new changes  All other systems were reviewed with the patient and are negative.  I have reviewed the past medical history, past surgical history, social history and family history with the patient and they are unchanged from previous note.  ALLERGIES:  is allergic to imitrex; sulfonamide derivatives; reglan; zofran; and ondansetron.  MEDICATIONS:  Current Outpatient Prescriptions  Medication Sig Dispense Refill  . acetaminophen (TYLENOL) 500 MG tablet Take 1,000 mg by mouth daily as needed for mild pain.    . clonazePAM (KLONOPIN) 0.5 MG tablet Take 0.5 mg by mouth at bedtime as needed for anxiety.     [name] desvenlafaxine (PRISTIQ) 100 MG 24 hr tablet Take 100 mg by mouth daily.      [name] lamoTRIgine (LAMICTAL) 100 MG tablet Take 100-200 mg by mouth 2 (two) times daily.  Takes 200mg  every morning and 100mg  every night at bedtime.    . metoprolol succinate (TOPROL-XL) 50 MG 24 hr tablet Take 1 tablet by mouth  daily with or immediately  following a  meal 90 tablet 0  . pantoprazole (PROTONIX) 40 MG tablet Take 1 tablet (40 mg total) by mouth daily. 90 tablet 3  . tamoxifen (NOLVADEX) 20 MG tablet Take 1 tablet (20 mg total) by mouth daily. 90 tablet 3  . traZODone (DESYREL) 100 MG tablet Take 100 mg by mouth at bedtime.     . valACYclovir (VALTREX) 500 MG tablet Take 500 mg by mouth daily as needed (for breakouts).     [name] VYVANSE 50 MG capsule Take 50 mg by mouth daily.  0   No current facility-administered medications for this visit.    PHYSICAL EXAMINATION: ECOG PERFORMANCE STATUS: 1 - Symptomatic but completely ambulatory  Filed Vitals:   04/20/15 1534  BP: 145/80  Pulse: 82  Temp: 98.5 F (36.9 C)  Resp: 18   Filed Weights   04/20/15 1534  Weight: 191 lb 9.6 oz (86.909 kg)    GENERAL:alert, no distress and comfortable SKIN: skin color, texture, turgor are normal, no rashes or significant lesions EYES: normal, Conjunctiva are pink and non-injected, sclera clear OROPHARYNX:no exudate, no erythema and lips, buccal mucosa, and tongue normal  NECK: supple, thyroid normal size, non-tender, without nodularity LYMPH:  no palpable lymphadenopathy in the cervical, axillary or inguinal LUNGS: clear to auscultation and percussion with normal breathing effort HEART: regular rate & rhythm and no murmurs and no lower extremity edema ABDOMEN:abdomen soft, non-tender and normal bowel sounds Musculoskeletal:no cyanosis of digits and no clubbing  NEURO: alert & oriented x 3 with fluent speech, no focal motor/sensory deficits, gait ataxia  LABORATORY DATA:  I have reviewed the data as listed   Chemistry      Component Value Date/Time   NA 139 01/03/2015 1525   NA 144 08/29/2014 1557   K 4.2 01/03/2015 1525   K 4.5 08/29/2014 1557   CL 102 01/03/2015 1525   CO2 33* 01/03/2015 1525   CO2 28 08/29/2014 1557   BUN 13 01/03/2015 1525   BUN 12.4 08/29/2014 1557   CREATININE 0.68 01/03/2015 1525   CREATININE 0.7 08/29/2014 1557       Component Value Date/Time   CALCIUM 9.0 01/03/2015 1525   CALCIUM 9.0 08/29/2014 1557   ALKPHOS 58 01/03/2015 1525   ALKPHOS 74 08/29/2014 1557   AST 19 01/03/2015 1525   AST 18 08/29/2014 1557   ALT 15 01/03/2015 1525   ALT 15 08/29/2014 1557   BILITOT 0.4 01/03/2015 1525   BILITOT 0.48 08/29/2014 1557       Lab Results  Component Value Date   WBC 6.1 01/03/2015   HGB 13.8 01/03/2015   HCT 40.7 01/03/2015   MCV 89.1 01/03/2015   PLT 225.0 01/03/2015   NEUTROABS 4.3 01/03/2015   ASSESSMENT & PLAN:  Malignant neoplasm of upper-outer quadrant of right female breast 1. 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  2. LCIS and atypical lobular hyperplasia in the lumpectomy specimen from the left breast.  Tamoxifen toxicities: 1. Severe hot flashes: these are so severe that they are affecting her life. We discussed different options including switching her to anastrozole and start her last menstrual period was 3 years ago. But we elected to decrease the dosage of tamoxifen to 10 mg daily. We will see if she tolerates this  any better.we also discussed about adding Effexor. But she does not want to take another medication to contract the side effects of one medication. 2. Muscle aches and pains and cramps especially at night  Ataxia: This is been going on for the past 2-3 months. She has fallen a few times already. I will obtain a brain MRI with and without contrast. Hypoechoic liver lesion: This was detected on ultrasound of the abdomen in February 2016. Liver MRI will be ordered for further evaluation.   Return to clinic in 1 month for follow-up.   Orders Placed This Encounter  Procedures  . MR Abdomen W Wo Contrast    Standing Status: Future     Number of Occurrences:      Standing Expiration Date: 06/20/2016    Order Specific Question:  Reason for Exam (SYMPTOM  OR DIAGNOSIS REQUIRED)    Answer:  Ataxia with H/O Breast cancer    Order Specific  Question:  Preferred imaging location?    Answer:  [facility]    Order Specific Question:  Does the patient have a pacemaker or implanted devices?    Answer:  No    Order Specific Question:  What is the patient's sedation requirement?    Answer:  No Sedation  . MR Brain W Wo Contrast    Standing Status: Future     Number of Occurrences:      Standing Expiration Date: 04/19/2016    Order Specific Question:  Reason for Exam (SYMPTOM  OR DIAGNOSIS REQUIRED)    Answer:  Ataxia with H/O Breast cancer    Order Specific Question:  Preferred imaging location?    Answer:  [facility]    Order Specific Question:  Does the patient have a pacemaker or implanted devices?    Answer:  No    Order Specific Question:  What is the patient's sedation requirement?    Answer:  No Sedation   The patient has a good understanding of the overall plan. she agrees with it. she will call with any problems that may develop before the next visit here.   Rulon Eisenmenger, MD

## 2015-04-21 ENCOUNTER — Other Ambulatory Visit: Payer: Self-pay

## 2015-04-24 ENCOUNTER — Ambulatory Visit
Admission: RE | Admit: 2015-04-24 | Discharge: 2015-04-24 | Disposition: A | Discharge status: Home / Self Care | Payer: 59 | Source: Ambulatory Visit | Attending: Hematology and Oncology | Admitting: Hematology and Oncology

## 2015-04-24 DIAGNOSIS — C50411 Malignant neoplasm of upper-outer quadrant of right female breast: Secondary | ICD-10-CM

## 2015-04-24 MED ORDER — GADOBENATE DIMEGLUMINE 529 MG/ML IV SOLN
18.0000 mL | Freq: Once | INTRAVENOUS | Status: AC | PRN
  Administered 2015-04-24: 18 mL via INTRAVENOUS

## 2015-05-01 ENCOUNTER — Ambulatory Visit
Admission: RE | Admit: 2015-05-01 | Discharge: 2015-05-01 | Disposition: A | Discharge status: Home / Self Care | Payer: 59 | Source: Ambulatory Visit | Attending: Hematology and Oncology | Admitting: Hematology and Oncology

## 2015-05-01 DIAGNOSIS — C50411 Malignant neoplasm of upper-outer quadrant of right female breast: Secondary | ICD-10-CM

## 2015-05-01 MED ORDER — GADOBENATE DIMEGLUMINE 529 MG/ML IV SOLN
18.0000 mL | Freq: Once | INTRAVENOUS | Status: AC | PRN
  Administered 2015-05-01: 18 mL via INTRAVENOUS

## 2015-05-03 ENCOUNTER — Other Ambulatory Visit: Payer: Self-pay | Admitting: Hematology and Oncology

## 2015-05-03 DIAGNOSIS — C50911 Malignant neoplasm of unspecified site of right female breast: Secondary | ICD-10-CM

## 2015-05-03 DIAGNOSIS — Z9889 Other specified postprocedural states: Secondary | ICD-10-CM

## 2015-05-04 ENCOUNTER — Telehealth: Payer: Self-pay

## 2015-05-04 NOTE — Telephone Encounter (Signed)
LMOVM - clarified MRI orders.  MRI abdomen for liver, and MRI Brain.  Dewayne Hatch to call clinic if she has any questions.

## 2015-05-04 NOTE — Telephone Encounter (Signed)
Arena at [facility] is asking if Dr Lindi Adie wants an MRI of breast. The pt understood him to say this. There was no mention in his OV note. A diagnostic breast mammogram was ordered. Her direct # [phone]. She asks to be called by tonight if possible.

## 2015-05-15 ENCOUNTER — Ambulatory Visit: Payer: Self-pay | Admitting: Hematology and Oncology

## 2015-05-15 ENCOUNTER — Other Ambulatory Visit: Payer: Self-pay

## 2015-05-15 NOTE — Assessment & Plan Note (Deleted)
1. 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  2. LCIS and atypical lobular hyperplasia in the lumpectomy specimen from the left breast.  Tamoxifen toxicities: 1. Severe hot flashes: these are so severe that they are affecting her life. Decreased tamoxifen to 10 mg daily. She did not want to take Effexor or Neurontin for it. 2. Muscle aches and pains and cramps especially at night  Ataxia: This is been going on for the past 2-3 months. Brain MRI is normal 04/25/2015   Hypoechoic liver lesion: This was detected on ultrasound of the abdomen in February 2016. Liver MRI on 05/02/2015: Revealed several small lesions compatible with cavernous hemangioma and several small simple cysts  Return to clinic in 6 months for follow-up.

## 2015-05-16 ENCOUNTER — Telehealth: Payer: Self-pay | Admitting: Hematology and Oncology

## 2015-05-16 NOTE — Telephone Encounter (Signed)
Called and left a message with a new appointment °

## 2015-05-17 ENCOUNTER — Telehealth: Payer: Self-pay | Admitting: Hematology and Oncology

## 2015-05-17 NOTE — Telephone Encounter (Signed)
Called and left a message returning her call  Tamara Cross

## 2015-05-26 ENCOUNTER — Other Ambulatory Visit: Payer: Self-pay | Admitting: *Deleted

## 2015-05-26 ENCOUNTER — Other Ambulatory Visit: Payer: Self-pay | Admitting: Internal Medicine

## 2015-05-26 ENCOUNTER — Telehealth: Payer: Self-pay | Admitting: Hematology and Oncology

## 2015-05-26 ENCOUNTER — Telehealth: Payer: Self-pay | Admitting: *Deleted

## 2015-05-26 NOTE — Telephone Encounter (Signed)
Fifteen minute conversation from patient who "requests Dr. Lindi Adie help with her issues in mood, depression, anxiety which are keeping from working at her best performance level at [employer]."  Feels strong armed at work after taking time off previously for her breast issues.  " I have someone who prescribes medicines but I need short term leave to rest and relax.  My therapist says I am still under his care so hopefully he can help.  Return number [phone].   This nurse suggested she begin with [employer] for [employer].  This nurse will notify Dr. Lindi Adie of this call and request.  Call transferred to collaborative voicemail at patient's request to leave message for Terri RN who she is aware is not at the desk today but asked to leave a message for.

## 2015-05-26 NOTE — Telephone Encounter (Signed)
Patient called stating that she was having some issues and needed to see Dr. Lindi Adie. Returned call and patient c/o having migraines and all over body aches and pains which is affecting her job. Patient requesting to take FMLA.  pof sent to move appt to next week and patient given fax number to send FMLA paperwork.

## 2015-05-26 NOTE — Telephone Encounter (Signed)
Returned call. Patient given fax number to send FMLA paperwork, also sent pof to have appt on 8/29 moved to next week.

## 2015-05-26 NOTE — Telephone Encounter (Signed)
S/w pt confirming MD visit per 08/19 POF.... Tamara Cross

## 2015-05-30 DIAGNOSIS — R270 Ataxia, unspecified: Secondary | ICD-10-CM | POA: Insufficient documentation

## 2015-05-31 ENCOUNTER — Ambulatory Visit (HOSPITAL_BASED_OUTPATIENT_CLINIC_OR_DEPARTMENT_OTHER): Payer: 59 | Admitting: Hematology and Oncology

## 2015-05-31 ENCOUNTER — Telehealth: Payer: Self-pay | Admitting: Hematology and Oncology

## 2015-05-31 ENCOUNTER — Encounter: Payer: Self-pay | Admitting: Hematology and Oncology

## 2015-05-31 VITALS — BP 126/77 | HR 94 | Temp 98.3°F | Resp 18 | Ht 64.0 in | Wt 193.2 lb

## 2015-05-31 DIAGNOSIS — D0502 Lobular carcinoma in situ of left breast: Secondary | ICD-10-CM

## 2015-05-31 DIAGNOSIS — M545 Low back pain: Secondary | ICD-10-CM | POA: Diagnosis not present

## 2015-05-31 DIAGNOSIS — M791 Myalgia: Secondary | ICD-10-CM

## 2015-05-31 DIAGNOSIS — C50411 Malignant neoplasm of upper-outer quadrant of right female breast: Secondary | ICD-10-CM

## 2015-05-31 DIAGNOSIS — G43909 Migraine, unspecified, not intractable, without status migrainosus: Secondary | ICD-10-CM

## 2015-05-31 DIAGNOSIS — D0511 Intraductal carcinoma in situ of right breast: Secondary | ICD-10-CM | POA: Diagnosis not present

## 2015-05-31 DIAGNOSIS — F329 Major depressive disorder, single episode, unspecified: Secondary | ICD-10-CM

## 2015-05-31 NOTE — Progress Notes (Signed)
Patient Care Team: Cassandria Anger, MD as PCP - General (Internal Medicine) Olga Millers, MD as Consulting Physician (Obstetrics and Gynecology) Lafayette Dragon, MD as Consulting Physician (Gastroenterology) Alphonsa Overall, MD as Consulting Physician (General Surgery) Eppie Gibson, MD as Consulting Physician (Radiation Oncology) Nicholas Lose, MD as Consulting Physician (Hematology and Oncology)  DIAGNOSIS: No matching staging information was found for the patient.  SUMMARY OF ONCOLOGIC HISTORY:   Malignant neoplasm of upper-outer quadrant of right female breast   03/28/2014 Initial Diagnosis Malignant neoplasm of upper-outer quadrant of female breast: Right breast DCIS, left breast LCIS/ALH   04/14/2014 Initial Biopsy Right breast core biopsy negative microcalcifications seen. Left breast 12:00 lobular neoplasia atypical lobular hyperplasia and LCIS: Left breast 9:00 fibroadenoma   05/02/2014 Surgery Lumpectomy right breast DCIS 1 cm margins clear: ER 100% PR 65% intermediate grade plus ALH; 3 lumpectomies in the left breast showing LCIS+ [facility]   06/20/2014 - 08/04/2014 Radiation Therapy Adjuvant radiation therapy   09/16/2014 -  Anti-estrogen oral therapy Tamoxifen 20 mg daily plans for 5 years    CHIEF COMPLIANT: severe hot flashes, muscle aches related to antiestrogen therapy  INTERVAL HISTORY: SHAKTHI SCIPIO is a 49 year old with above-mentioned history of right breast DCIS who is currently on tamoxifen therapy and is struggling with multiple issues in addition to the side effects of tamoxifen therapy which include hot flashes and myalgias, she is having difficulty and difficulty balancing work and life. It appears that she has become distant from her family and staying away from all of her family members. She is also having difficulty at work because she is unable to concentrate and focus. She appears to be exhausted.  REVIEW OF SYSTEMS:   Constitutional: Denies fevers, chills or abnormal  weight loss Eyes: Denies blurriness of vision Ears, nose, mouth, throat, and face: Denies mucositis or sore throat Respiratory: Denies cough, dyspnea or wheezes Cardiovascular: Denies palpitation, chest discomfort or lower extremity swelling Gastrointestinal:  Denies nausea, heartburn or change in bowel habits Skin: Denies abnormal skin rashes Lymphatics: Denies new lymphadenopathy or easy bruising Neurological:musculoskeletal aches and pains throughout her body Behavioral/Psych: depressive symptoms   All other systems were reviewed with the patient and are negative.  I have reviewed the past medical history, past surgical history, social history and family history with the patient and they are unchanged from previous note.  ALLERGIES:  is allergic to imitrex; sulfonamide derivatives; reglan; zofran; and ondansetron.  MEDICATIONS:  Current Outpatient Prescriptions  Medication Sig Dispense Refill  . acetaminophen (TYLENOL) 500 MG tablet Take 1,000 mg by mouth daily as needed for mild pain.    . clonazePAM (KLONOPIN) 0.5 MG tablet Take 0.5 mg by mouth at bedtime as needed for anxiety.     [name] desvenlafaxine (PRISTIQ) 100 MG 24 hr tablet Take 100 mg by mouth daily.      [name] lamoTRIgine (LAMICTAL) 100 MG tablet Take 100-200 mg by mouth 2 (two) times daily. Takes 200mg  every morning and 100mg  every night at bedtime.    . metoprolol succinate (TOPROL-XL) 50 MG 24 hr tablet TAKE 1 TABLET BY MOUTH  DAILY WITH OR IMMEDIATELY  FOLLOWING A MEAL 90 tablet 0  . pantoprazole (PROTONIX) 40 MG tablet Take 1 tablet (40 mg total) by mouth daily. 90 tablet 3  . tamoxifen (NOLVADEX) 20 MG tablet Take 1 tablet (20 mg total) by mouth daily. 90 tablet 3  . traZODone (DESYREL) 100 MG tablet Take 100 mg by mouth at bedtime.     [name]  valACYclovir (VALTREX) 500 MG tablet Take 500 mg by mouth daily as needed (for breakouts).     [name] VYVANSE 50 MG capsule Take 50 mg by mouth daily.  0   No current facility-administered  medications for this visit.    PHYSICAL EXAMINATION: ECOG PERFORMANCE STATUS: 2 - Symptomatic, <50% confined to bed  Filed Vitals:   05/31/15 1100  BP: 126/77  Pulse: 94  Temp: 98.3 F (36.8 C)  Resp: 18   Filed Weights   05/31/15 1100  Weight: 193 lb 3.2 oz (87.635 kg)    GENERAL:alert, no distress and comfortable SKIN: skin color, texture, turgor are normal, no rashes or significant lesions EYES: normal, Conjunctiva are pink and non-injected, sclera clear OROPHARYNX:no exudate, no erythema and lips, buccal mucosa, and tongue normal  NECK: supple, thyroid normal size, non-tender, without nodularity LYMPH:  no palpable lymphadenopathy in the cervical, axillary or inguinal LUNGS: clear to auscultation and percussion with normal breathing effort HEART: regular rate & rhythm and no murmurs and no lower extremity edema ABDOMEN:abdomen soft, non-tender and normal bowel sounds Musculoskeletal:severe myalgias especially in the lower back and legs NEURO: alert & oriented x 3 with fluent speech, no focal motor/sensory deficits  LABORATORY DATA:  I have reviewed the data as listed   Chemistry      Component Value Date/Time   NA 139 01/03/2015 1525   NA 144 08/29/2014 1557   K 4.2 01/03/2015 1525   K 4.5 08/29/2014 1557   CL 102 01/03/2015 1525   CO2 33* 01/03/2015 1525   CO2 28 08/29/2014 1557   BUN 13 01/03/2015 1525   BUN 12.4 08/29/2014 1557   CREATININE 0.68 01/03/2015 1525   CREATININE 0.7 08/29/2014 1557      Component Value Date/Time   CALCIUM 9.0 01/03/2015 1525   CALCIUM 9.0 08/29/2014 1557   ALKPHOS 58 01/03/2015 1525   ALKPHOS 74 08/29/2014 1557   AST 19 01/03/2015 1525   AST 18 08/29/2014 1557   ALT 15 01/03/2015 1525   ALT 15 08/29/2014 1557   BILITOT 0.4 01/03/2015 1525   BILITOT 0.48 08/29/2014 1557       Lab Results  Component Value Date   WBC 6.1 01/03/2015   HGB 13.8 01/03/2015   HCT 40.7 01/03/2015   MCV 89.1 01/03/2015   PLT 225.0  01/03/2015   NEUTROABS 4.3 01/03/2015    ASSESSMENT & PLAN:  Malignant neoplasm of upper-outer quadrant of right female breast 1. 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  2. LCIS and atypical lobular hyperplasia in the lumpectomy specimen from the left breast.  Tamoxifen toxicities: 1. Severe hot flashes:patient agrees a dosage of tamoxifen to 10 mg but it did not make any difference so she went back to taking full 20 mg dosage. 2. Muscle aches and pains and cramps especially at night 3. Severe migraines 4. Severe low back pain 5. Major depression symptoms: With inability to manage work and life  Radiology review: 1. Brain MRI showed small vessel changes 2. 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 Return to clinic in 6 months for follow-up  No orders of the defined types were placed in this encounter.   The patient has a good understanding of the overall plan.  she agrees with it. she will call with any problems that may develop before the next visit here.   Rulon Eisenmenger, MD

## 2015-05-31 NOTE — Telephone Encounter (Signed)
Appointments made and avs printed for patient °

## 2015-05-31 NOTE — Assessment & Plan Note (Signed)
1. 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  2. LCIS and atypical lobular hyperplasia in the lumpectomy specimen from the left breast.  Tamoxifen toxicities: 1. Severe hot flashes:patient agrees a dosage of tamoxifen to 10 mg but it did not make any difference so she went back to taking full 20 mg dosage. 2. Muscle aches and pains and cramps especially at night 3. Severe migraines 4. Severe low back pain 5. Major depression symptoms: With inability to manage work and life  Radiology review: 1. Brain MRI showed small vessel changes 2. 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 Return to clinic in 6 months for follow-up

## 2015-06-01 ENCOUNTER — Encounter: Payer: Self-pay | Admitting: *Deleted

## 2015-06-01 NOTE — Progress Notes (Signed)
[place] Work  Clinical Social Work was referred by patient navigator for assessment of psychosocial needs as she moves into survivorship.  Clinical Social Worker contacted patient at home to offer support and assess for needs.  CSW left message to return CSW call.   Loren Racer, [place] Worker [place]  [place] Phone: [phone] Fax: [phone]

## 2015-06-05 ENCOUNTER — Encounter: Payer: Self-pay | Admitting: Hematology and Oncology

## 2015-06-05 ENCOUNTER — Ambulatory Visit: Payer: Self-pay | Admitting: Hematology and Oncology

## 2015-06-05 NOTE — Progress Notes (Signed)
I faxed [employer]  [phone] form/notes.

## 2015-06-07 ENCOUNTER — Ambulatory Visit
Admission: RE | Admit: 2015-06-07 | Discharge: 2015-06-07 | Disposition: A | Discharge status: Home / Self Care | Payer: 59 | Source: Ambulatory Visit | Attending: Hematology and Oncology | Admitting: Hematology and Oncology

## 2015-06-07 DIAGNOSIS — C50911 Malignant neoplasm of unspecified site of right female breast: Secondary | ICD-10-CM

## 2015-06-07 DIAGNOSIS — Z9889 Other specified postprocedural states: Secondary | ICD-10-CM

## 2015-06-08 ENCOUNTER — Ambulatory Visit
Admission: RE | Admit: 2015-06-08 | Discharge: 2015-06-08 | Disposition: A | Discharge status: Home / Self Care | Payer: 59 | Source: Ambulatory Visit | Attending: Hematology and Oncology | Admitting: Hematology and Oncology

## 2015-06-21 ENCOUNTER — Telehealth: Payer: Self-pay

## 2015-06-21 NOTE — Telephone Encounter (Signed)
Pt called requesting additional time to be excused from work.  Let pt know to have the forms faxed to [phone].  Pt voiced understanding.

## 2015-06-23 NOTE — Telephone Encounter (Signed)
This RN faxed disability paperwork to her employer. Patient verbalized understanding.

## 2015-06-23 NOTE — Telephone Encounter (Signed)
Voicemail from patient asking "if Dr. Lindi Adie has approved my additional medical leave yet?"

## 2015-06-23 NOTE — Telephone Encounter (Signed)
Voicemail requesting information be sent to employer TODAY for extension of disability.  Notified collaborative.

## 2015-06-26 NOTE — Telephone Encounter (Signed)
Copy provided to managed care.  Original sent to scan.

## 2015-06-27 ENCOUNTER — Encounter: Payer: Self-pay | Admitting: Hematology and Oncology

## 2015-06-27 NOTE — Progress Notes (Signed)
Nurse faxed [employer]  06/23/15 with confirmation. On file.

## 2015-08-11 ENCOUNTER — Encounter: Payer: Self-pay | Admitting: Hematology and Oncology

## 2015-08-11 NOTE — Progress Notes (Signed)
I placed [employer] form on desk of nurse for dr. Lindi Adie

## 2015-08-16 ENCOUNTER — Encounter: Payer: Self-pay | Admitting: Hematology and Oncology

## 2015-08-16 NOTE — Progress Notes (Signed)
I faxed [employer] form  [phone]

## 2015-08-26 ENCOUNTER — Other Ambulatory Visit: Payer: Self-pay | Admitting: Internal Medicine

## 2015-08-28 ENCOUNTER — Telehealth: Payer: Self-pay | Admitting: Hematology and Oncology

## 2015-08-28 ENCOUNTER — Telehealth: Payer: Self-pay | Admitting: *Deleted

## 2015-08-28 NOTE — Telephone Encounter (Signed)
Called patient back as she had been speaking with [place] about needing an appointment for long term diasb and was to call back with dates,appointment made

## 2015-08-28 NOTE — Telephone Encounter (Signed)
Called and left message for patient to return call. Patient needs f/u appt in reference to her long term disability. Asked patient to clarify time frame that she needs appt.

## 2015-09-06 ENCOUNTER — Ambulatory Visit (HOSPITAL_BASED_OUTPATIENT_CLINIC_OR_DEPARTMENT_OTHER): Payer: 59 | Admitting: Hematology and Oncology

## 2015-09-06 ENCOUNTER — Encounter: Payer: Self-pay | Admitting: Hematology and Oncology

## 2015-09-06 VITALS — BP 129/67 | HR 83 | Temp 97.9°F | Resp 18 | Ht 64.0 in | Wt 194.3 lb

## 2015-09-06 DIAGNOSIS — K769 Liver disease, unspecified: Secondary | ICD-10-CM | POA: Diagnosis not present

## 2015-09-06 DIAGNOSIS — C50411 Malignant neoplasm of upper-outer quadrant of right female breast: Secondary | ICD-10-CM | POA: Diagnosis not present

## 2015-09-06 DIAGNOSIS — R270 Ataxia, unspecified: Secondary | ICD-10-CM

## 2015-09-06 MED ORDER — ANASTROZOLE 1 MG PO TABS
1.0000 mg | ORAL_TABLET | Freq: Every day | ORAL | Status: DC
Start: 2015-09-06 — End: 2015-10-19

## 2015-09-06 NOTE — Assessment & Plan Note (Signed)
1. 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  2. LCIS and atypical lobular hyperplasia in the lumpectomy specimen from the left breast.  Tamoxifen toxicities: 1. Severe hot flashes: these are so severe that they are affecting her life. They did not change with decrease in tamoxifen to 10 mg daily. She did not want to take Effexor or Neurontin for it. 2. Muscle aches and pains and cramps especially at night  Ataxia: Brain MRI is normal 04/25/2015   Hypoechoic liver lesion: This was detected on ultrasound of the abdomen in February 2016. Liver MRI on 05/02/2015: Revealed several small lesions compatible with cavernous hemangioma and several small simple cysts  Return to clinic in 6 months for follow-up.

## 2015-09-06 NOTE — Addendum Note (Signed)
Addended by: Prentiss Bells on: 09/06/2015 12:46 PM   Modules accepted: Medications

## 2015-09-06 NOTE — Progress Notes (Signed)
Patient Care Team: Cassandria Anger, MD as PCP - General (Internal Medicine) Olga Millers, MD as Consulting Physician (Obstetrics and Gynecology) Lafayette Dragon, MD as Consulting Physician (Gastroenterology) Alphonsa Overall, MD as Consulting Physician (General Surgery) Eppie Gibson, MD as Consulting Physician (Radiation Oncology) Nicholas Lose, MD as Consulting Physician (Hematology and Oncology)  DIAGNOSIS: No matching staging information was found for the patient.  SUMMARY OF ONCOLOGIC HISTORY:   Malignant neoplasm of upper-outer quadrant of right female breast ([place])   03/28/2014 Initial Diagnosis Malignant neoplasm of upper-outer quadrant of female breast: Right breast DCIS, left breast LCIS/ALH   04/14/2014 Initial Biopsy Right breast core biopsy negative microcalcifications seen. Left breast 12:00 lobular neoplasia atypical lobular hyperplasia and LCIS: Left breast 9:00 fibroadenoma   05/02/2014 Surgery Lumpectomy right breast DCIS 1 cm margins clear: ER 100% PR 65% intermediate grade plus ALH; 3 lumpectomies in the left breast showing LCIS+ [facility]   06/20/2014 - 08/04/2014 Radiation Therapy Adjuvant radiation therapy   09/16/2014 -  Anti-estrogen oral therapy Tamoxifen 20 mg daily switched to anastrozole 09/06/2015 due to severe hot flashes    CHIEF COMPLIANT: continuing problems with hot flashes, emotional problems, diffuse body aches and pains  INTERVAL HISTORY: MAYME GUNSOLUS is a 49 year old with above-mentioned history of DCIS right breast who has been on tamoxifen therapy. She has had a lot of hot flashes and emotional problems mood swings. Apart from all this a lot is happening in her family as well. Her son had a near fatal car accident and her other son has had some depression issues. She has been having diffuse body aches and pains especially her right knee has been bothering her and she's been getting injections with orthopedics. She reports that her entire body especially her  shoulders and her back constantly hurt.  REVIEW OF SYSTEMS:   Constitutional: Denies fevers, chills or abnormal weight loss Eyes: Denies blurriness of vision Ears, nose, mouth, throat, and face: Denies mucositis or sore throat Respiratory: Denies cough, dyspnea or wheezes Cardiovascular: Denies palpitation, chest discomfort or lower extremity swelling Gastrointestinal:  Denies nausea, heartburn or change in bowel habits Skin: Denies abnormal skin rashes Lymphatics: Denies new lymphadenopathy or easy bruising Neurological:Denies numbness, tingling or new weaknesses Behavioral/Psych: depression  Breast:  denies any pain or lumps or nodules in either breasts All other systems were reviewed with the patient and are negative.  I have reviewed the past medical history, past surgical history, social history and family history with the patient and they are unchanged from previous note.  ALLERGIES:  is allergic to imitrex; sulfonamide derivatives; reglan; zofran; and ondansetron.  MEDICATIONS:  Current Outpatient Prescriptions  Medication Sig Dispense Refill  . acetaminophen (TYLENOL) 500 MG tablet Take 1,000 mg by mouth daily as needed for mild pain.    [name] anastrozole (ARIMIDEX) 1 MG tablet Take 1 tablet (1 mg total) by mouth daily. 90 tablet 3  . clonazePAM (KLONOPIN) 0.5 MG tablet Take 0.5 mg by mouth at bedtime as needed for anxiety.     [name] desvenlafaxine (PRISTIQ) 100 MG 24 hr tablet Take 100 mg by mouth daily.      [name] lamoTRIgine (LAMICTAL) 100 MG tablet Take 100-200 mg by mouth 2 (two) times daily. Takes 200mg  every morning and 100mg  every night at bedtime.    . metoprolol succinate (TOPROL-XL) 50 MG 24 hr tablet Take 1 tablet by mouth  daily with or immediately  following a meal 30 tablet 0  . pantoprazole (PROTONIX) 40 MG  tablet Take 1 tablet (40 mg total) by mouth daily. 90 tablet 3  . traZODone (DESYREL) 100 MG tablet Take 100 mg by mouth at bedtime.     . valACYclovir (VALTREX) 500 MG  tablet Take 500 mg by mouth daily as needed (for breakouts).     [name] VYVANSE 50 MG capsule Take 50 mg by mouth daily.  0   No current facility-administered medications for this visit.    PHYSICAL EXAMINATION: ECOG PERFORMANCE STATUS: 1 - Symptomatic but completely ambulatory  Filed Vitals:   09/06/15 1102  BP: 129/67  Pulse: 83  Temp: 97.9 F (36.6 C)  Resp: 18   Filed Weights   09/06/15 1102  Weight: 194 lb 4.8 oz (88.134 kg)    GENERAL:alert, no distress and comfortable SKIN: skin color, texture, turgor are normal, no rashes or significant lesions EYES: normal, Conjunctiva are pink and non-injected, sclera clear OROPHARYNX:no exudate, no erythema and lips, buccal mucosa, and tongue normal  NECK: supple, thyroid normal size, non-tender, without nodularity LYMPH:  no palpable lymphadenopathy in the cervical, axillary or inguinal LUNGS: clear to auscultation and percussion with normal breathing effort HEART: regular rate & rhythm and no murmurs and no lower extremity edema ABDOMEN:abdomen soft, non-tender and normal bowel sounds Musculoskeletal:no cyanosis of digits and no clubbing  NEURO: alert & oriented x 3 with fluent speech, no focal motor/sensory deficits  LABORATORY DATA:  I have reviewed the data as listed   Chemistry      Component Value Date/Time   NA 139 01/03/2015 1525   NA 144 08/29/2014 1557   K 4.2 01/03/2015 1525   K 4.5 08/29/2014 1557   CL 102 01/03/2015 1525   CO2 33* 01/03/2015 1525   CO2 28 08/29/2014 1557   BUN 13 01/03/2015 1525   BUN 12.4 08/29/2014 1557   CREATININE 0.68 01/03/2015 1525   CREATININE 0.7 08/29/2014 1557      Component Value Date/Time   CALCIUM 9.0 01/03/2015 1525   CALCIUM 9.0 08/29/2014 1557   ALKPHOS 58 01/03/2015 1525   ALKPHOS 74 08/29/2014 1557   AST 19 01/03/2015 1525   AST 18 08/29/2014 1557   ALT 15 01/03/2015 1525   ALT 15 08/29/2014 1557   BILITOT 0.4 01/03/2015 1525   BILITOT 0.48 08/29/2014 1557        Lab Results  Component Value Date   WBC 6.1 01/03/2015   HGB 13.8 01/03/2015   HCT 40.7 01/03/2015   MCV 89.1 01/03/2015   PLT 225.0 01/03/2015   NEUTROABS 4.3 01/03/2015   ASSESSMENT & PLAN:  Malignant neoplasm of upper-outer quadrant of right female breast ([place]) 1. 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  2. LCIS and atypical lobular hyperplasia in the lumpectomy specimen from the left breast.  Tamoxifen toxicities: 1. Severe hot flashes: these are so severe that they are affecting her life. They did not change with decrease in tamoxifen to 10 mg daily. She did not want to take Effexor or Neurontin for it. 2. Muscle aches and pains and cramps especially at night  I discussed about switching her from tamoxifen and anastrozole. This will hopefully improve her hot flashes and mood swings and emotional problems. Because we are starting anastrozole, I recommended getting a bone density test.  Ataxia: Brain MRI is normal 04/25/2015 . Her symptoms have not improved. Hence I will refer her to see neurology. Diffuse bone pain: We will obtain a bone scan to evaluate this further. She sees  orthopedic specialist for this.  Hypoechoic liver lesion: This was detected on ultrasound of the abdomen in February 2016. Liver MRI on 05/02/2015: Revealed several small lesions compatible with cavernous hemangioma and several small simple cysts  Return to clinic in 3 months for follow-up on anastrozole.   Orders Placed This Encounter  Procedures  . NM Bone Scan Whole Body    Standing Status: Future     Number of Occurrences:      Standing Expiration Date: 09/05/2016    Order Specific Question:  Reason for Exam (SYMPTOM  OR DIAGNOSIS REQUIRED)    Answer:  Diffuse bone pain    Order Specific Question:  Is the patient pregnant?    Answer:  No    Order Specific Question:  Preferred imaging location?    Answer:  [facility]    Order Specific Question:  If  indicated for the ordered procedure, I authorize the administration of a radiopharmaceutical per Radiology protocol    Answer:  Yes  . DG Bone Density    Standing Status: Future     Number of Occurrences:      Standing Expiration Date: 09/05/2016    Order Specific Question:  Reason for Exam (SYMPTOM  OR DIAGNOSIS REQUIRED)    Answer:  Post menopausal on anti estrogen therapy    Order Specific Question:  Is the patient pregnant?    Answer:  No    Order Specific Question:  Preferred imaging location?    Answer:  [facility]  . Ambulatory referral to Neurology    Referral Priority:  Routine    Referral Type:  Consultation    Referral Reason:  Specialty Services Required    Requested Specialty:  Neurology    Number of Visits Requested:  1   The patient has a good understanding of the overall plan. she agrees with it. she will call with any problems that may develop before the next visit here.   Rulon Eisenmenger, MD 09/06/2015

## 2015-09-08 ENCOUNTER — Encounter: Payer: Self-pay | Admitting: Diagnostic Neuroimaging

## 2015-09-08 ENCOUNTER — Ambulatory Visit (INDEPENDENT_AMBULATORY_CARE_PROVIDER_SITE_OTHER): Payer: 59 | Admitting: Diagnostic Neuroimaging

## 2015-09-08 VITALS — BP 126/81 | HR 92 | Ht 64.0 in | Wt 192.4 lb

## 2015-09-08 DIAGNOSIS — R200 Anesthesia of skin: Secondary | ICD-10-CM

## 2015-09-08 DIAGNOSIS — M25579 Pain in unspecified ankle and joints of unspecified foot: Secondary | ICD-10-CM | POA: Diagnosis not present

## 2015-09-08 DIAGNOSIS — R208 Other disturbances of skin sensation: Secondary | ICD-10-CM

## 2015-09-08 DIAGNOSIS — G609 Hereditary and idiopathic neuropathy, unspecified: Secondary | ICD-10-CM

## 2015-09-08 DIAGNOSIS — R269 Unspecified abnormalities of gait and mobility: Secondary | ICD-10-CM | POA: Diagnosis not present

## 2015-09-08 DIAGNOSIS — R270 Ataxia, unspecified: Secondary | ICD-10-CM | POA: Diagnosis not present

## 2015-09-08 NOTE — Patient Instructions (Signed)
Thank you for coming to see [place] at [facility]. I hope we have been able to provide you high quality care today.  You may receive a patient satisfaction survey over the next few weeks. We would appreciate your feedback and comments so that we may continue to improve ourselves and the health of our patients.  - try compounded neuropathy cream - I will check MRI scans and lab testing   ~~~~~~~~~~~~~~~~~~~~~~~~~~~~~~~~~~~~~~~~~~~~~~~~~~~~~~~~~~~~~~~~~  DR. PENUMALLI'S GUIDE TO HAPPY AND HEALTHY LIVING These are some of my general health and wellness recommendations. Some of them may apply to you better than others. Please use common sense as you try these suggestions and feel free to ask me any questions.   ACTIVITY/FITNESS Mental, social, emotional and physical stimulation are very important for brain and body health. Try learning a new activity (arts, music, language, sports, games).  Keep moving your body to the best of your abilities. You can do this at home, inside or outside, the park, community center, gym or anywhere you like. Consider a physical therapist or personal trainer to get started. Consider the app Sworkit. Fitness trackers such as smart-watches, smart-phones or Fitbits can help as well.   NUTRITION Eat more plants: colorful vegetables, nuts, seeds and berries.  Eat less sugar, salt, preservatives and processed foods.  Avoid toxins such as cigarettes and alcohol.  Drink water when you are thirsty. Warm water with a slice of lemon is an excellent morning drink to start the day.  Consider these websites for more information The Nutrition Source (https://www.henry-hernandez.biz/) Precision Nutrition (WindowBlog.ch)   RELAXATION Consider practicing mindfulness meditation or other relaxation techniques such as deep breathing, prayer, yoga, tai chi, massage. See website mindful.org or the apps Headspace or Calm  to help get started.   SLEEP Try to get at least 7-8+ hours sleep per day. Regular exercise and reduced caffeine will help you sleep better. Practice good sleep hygeine techniques. See website sleep.org for more information.   PLANNING Prepare estate planning, living will, healthcare POA documents. Sometimes this is best planned with the help of an attorney. Theconversationproject.org and agingwithdignity.org are excellent resources.
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
## 2015-09-11 LAB — NEUROPATHY PANEL
A/G Ratio: 1.3 (ref 0.7–1.7)
ALBUMIN ELP: 3.6 g/dL (ref 2.9–4.4)
ANA: POSITIVE — AB
ANGIO CONVERT ENZYME: 56 U/L (ref 14–82)
Alpha 1: 0.3 g/dL (ref 0.0–0.4)
Alpha 2: 0.7 g/dL (ref 0.4–1.0)
Beta: 1 g/dL (ref 0.7–1.3)
GAMMA GLOBULIN: 0.8 g/dL (ref 0.4–1.8)
GLOBULIN, TOTAL: 2.8 g/dL (ref 2.2–3.9)
RHEUMATOID FACTOR: 12.2 [IU]/mL (ref 0.0–13.9)
Sed Rate: 2 mm/hr (ref 0–40)
TOTAL PROTEIN: 6.4 g/dL (ref 6.0–8.5)
TSH: 1.05 u[IU]/mL (ref 0.450–4.500)
Vit D, 25-Hydroxy: 34.1 ng/mL (ref 30.0–100.0)
Vitamin B-12: 798 pg/mL (ref 211–946)

## 2015-09-12 ENCOUNTER — Other Ambulatory Visit: Payer: Self-pay

## 2015-09-12 DIAGNOSIS — Z79811 Long term (current) use of aromatase inhibitors: Secondary | ICD-10-CM

## 2015-09-12 DIAGNOSIS — Z780 Asymptomatic menopausal state: Secondary | ICD-10-CM

## 2015-09-13 ENCOUNTER — Telehealth: Payer: Self-pay | Admitting: *Deleted

## 2015-09-13 NOTE — Telephone Encounter (Signed)
LVM informing patient, per Dr Leta Baptist, all labs normal with one lab pending for HCV. Per Dr Leta Baptist, informed her that ANA (autoimmune) was positive, but Dr Leta Baptist thinks it is a false positive because she has no symptoms or problems that would indicate she has autoimmune problem. Reminded her of FU in Jan 2017. Left this caller's name, number for any questions.

## 2015-09-25 ENCOUNTER — Encounter (HOSPITAL_COMMUNITY)
Admission: RE | Admit: 2015-09-25 | Discharge: 2015-09-25 | Disposition: A | Discharge status: Home / Self Care | Payer: 59 | Source: Ambulatory Visit | Attending: Hematology and Oncology | Admitting: Hematology and Oncology

## 2015-09-25 DIAGNOSIS — C50411 Malignant neoplasm of upper-outer quadrant of right female breast: Secondary | ICD-10-CM | POA: Insufficient documentation

## 2015-09-25 MED ORDER — TECHNETIUM TC 99M MEDRONATE IV KIT
27.0000 | PACK | Freq: Once | INTRAVENOUS | Status: AC | PRN
  Administered 2015-09-25: 27 via INTRAVENOUS

## 2015-09-27 ENCOUNTER — Ambulatory Visit (INDEPENDENT_AMBULATORY_CARE_PROVIDER_SITE_OTHER): Payer: 59

## 2015-09-27 DIAGNOSIS — R270 Ataxia, unspecified: Secondary | ICD-10-CM

## 2015-09-27 DIAGNOSIS — G609 Hereditary and idiopathic neuropathy, unspecified: Secondary | ICD-10-CM

## 2015-09-27 DIAGNOSIS — M25579 Pain in unspecified ankle and joints of unspecified foot: Secondary | ICD-10-CM

## 2015-09-27 DIAGNOSIS — R269 Unspecified abnormalities of gait and mobility: Secondary | ICD-10-CM | POA: Diagnosis not present

## 2015-09-27 DIAGNOSIS — R208 Other disturbances of skin sensation: Secondary | ICD-10-CM

## 2015-09-27 DIAGNOSIS — R200 Anesthesia of skin: Secondary | ICD-10-CM

## 2015-09-27 MED ORDER — GADOPENTETATE DIMEGLUMINE 469.01 MG/ML IV SOLN: 18.0000 mL | Freq: Once | INTRAVENOUS | Status: AC | PRN

## 2015-10-10 ENCOUNTER — Ambulatory Visit: Payer: 59 | Admitting: Diagnostic Neuroimaging

## 2015-10-12 ENCOUNTER — Ambulatory Visit (INDEPENDENT_AMBULATORY_CARE_PROVIDER_SITE_OTHER): Payer: 59 | Admitting: Diagnostic Neuroimaging

## 2015-10-12 ENCOUNTER — Encounter: Payer: Self-pay | Admitting: Diagnostic Neuroimaging

## 2015-10-12 VITALS — BP 102/71 | HR 72 | Wt 199.0 lb

## 2015-10-12 DIAGNOSIS — M5442 Lumbago with sciatica, left side: Secondary | ICD-10-CM | POA: Diagnosis not present

## 2015-10-12 DIAGNOSIS — M5441 Lumbago with sciatica, right side: Secondary | ICD-10-CM

## 2015-10-12 DIAGNOSIS — R269 Unspecified abnormalities of gait and mobility: Secondary | ICD-10-CM | POA: Diagnosis not present

## 2015-10-12 DIAGNOSIS — M542 Cervicalgia: Secondary | ICD-10-CM | POA: Diagnosis not present

## 2015-10-12 NOTE — Patient Instructions (Signed)
Thank you for coming to see [place] at [facility]. I hope we have been able to provide you high quality care today.  You may receive a patient satisfaction survey over the next few weeks. We would appreciate your feedback and comments so that we may continue to improve ourselves and the health of our patients.  - consider pain management clinic - consider rheumatology evaluation   ~~~~~~~~~~~~~~~~~~~~~~~~~~~~~~~~~~~~~~~~~~~~~~~~~~~~~~~~~~~~~~~~~  DR. Agapita Savarino'S GUIDE TO HAPPY AND HEALTHY LIVING These are some of my general health and wellness recommendations. Some of them may apply to you better than others. Please use common sense as you try these suggestions and feel free to ask me any questions.   ACTIVITY/FITNESS Mental, social, emotional and physical stimulation are very important for brain and body health. Try learning a new activity (arts, music, language, sports, games).  Keep moving your body to the best of your abilities. You can do this at home, inside or outside, the park, community center, gym or anywhere you like. Consider a physical therapist or personal trainer to get started. Consider the app Sworkit. Fitness trackers such as smart-watches, smart-phones or Fitbits can help as well.   NUTRITION Eat more plants: colorful vegetables, nuts, seeds and berries.  Eat less sugar, salt, preservatives and processed foods.  Avoid toxins such as cigarettes and alcohol.  Drink water when you are thirsty. Warm water with a slice of lemon is an excellent morning drink to start the day.  Consider these websites for more information The Nutrition Source (https://www.henry-hernandez.biz/) Precision Nutrition (WindowBlog.ch)   RELAXATION Consider practicing mindfulness meditation or other relaxation techniques such as deep breathing, prayer, yoga, tai chi, massage. See website mindful.org or the apps Headspace or Calm to help  get started.   SLEEP Try to get at least 7-8+ hours sleep per day. Regular exercise and reduced caffeine will help you sleep better. Practice good sleep hygeine techniques. See website sleep.org for more information.   PLANNING Prepare estate planning, living will, healthcare POA documents. Sometimes this is best planned with the help of an attorney. Theconversationproject.org and agingwithdignity.org are excellent resources.
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
## 2015-10-19 ENCOUNTER — Encounter: Payer: Self-pay | Admitting: Hematology and Oncology

## 2015-10-19 ENCOUNTER — Telehealth: Payer: Self-pay | Admitting: Hematology and Oncology

## 2015-10-19 ENCOUNTER — Ambulatory Visit (HOSPITAL_BASED_OUTPATIENT_CLINIC_OR_DEPARTMENT_OTHER): Payer: 59 | Admitting: Hematology and Oncology

## 2015-10-19 DIAGNOSIS — C50411 Malignant neoplasm of upper-outer quadrant of right female breast: Secondary | ICD-10-CM

## 2015-10-19 MED ORDER — TAMOXIFEN CITRATE 20 MG PO TABS: 10.0000 mg | ORAL_TABLET | Freq: Every day | ORAL | Status: DC

## 2015-10-19 MED ORDER — TAMOXIFEN CITRATE 20 MG PO TABS: 20.0000 mg | ORAL_TABLET | Freq: Every day | ORAL | Status: DC

## 2015-10-19 NOTE — Telephone Encounter (Signed)
Appointments made and avs printed for patient °

## 2015-10-19 NOTE — Progress Notes (Signed)
Patient Care Team: Cassandria Anger, MD as PCP - General (Internal Medicine) Olga Millers, MD as Consulting Physician (Obstetrics and Gynecology) Lafayette Dragon, MD as Consulting Physician (Gastroenterology) Alphonsa Overall, MD as Consulting Physician (General Surgery) Eppie Gibson, MD as Consulting Physician (Radiation Oncology) Nicholas Lose, MD as Consulting Physician (Hematology and Oncology)  DIAGNOSIS: No matching staging information was found for the patient.  SUMMARY OF ONCOLOGIC HISTORY:   Malignant neoplasm of upper-outer quadrant of right female breast ([place])   03/28/2014 Initial Diagnosis Malignant neoplasm of upper-outer quadrant of female breast: Right breast DCIS, left breast LCIS/ALH   04/14/2014 Initial Biopsy Right breast core biopsy negative microcalcifications seen. Left breast 12:00 lobular neoplasia atypical lobular hyperplasia and LCIS: Left breast 9:00 fibroadenoma   05/02/2014 Surgery Lumpectomy right breast DCIS 1 cm margins clear: ER 100% PR 65% intermediate grade plus ALH; 3 lumpectomies in the left breast showing LCIS+ [facility]   06/20/2014 - 08/04/2014 Radiation Therapy Adjuvant radiation therapy   09/16/2014 -  Anti-estrogen oral therapy Tamoxifen 20 mg daily switched to anastrozole 09/06/2015 due to severe hot flashes    CHIEF COMPLIANT: complaining of stiffness in the fingers, numbness, severe hot flashes, right abdominal wall lump  INTERVAL HISTORY: Tamara Cross is a 50 year old with above-mentioned history of right breast cancer who is here complaining of worsening hot flashes from anastrozole. She thinks that they're worse than tamoxifen. She also complains of intense pain in the fingers with stiffness and discomfort especially in the middle of the night and early morning. She also felt a lump on the right abdominal wall. The part that she is complaining of abdominal distention and 10 pound weight gain along with abdominal discomfort. She wanted [place] to see [place] to  review all of these symptoms.  REVIEW OF SYSTEMS:   Constitutional: Denies fevers, chills or abnormal weight loss Eyes: Denies blurriness of vision Ears, nose, mouth, throat, and face: Denies mucositis or sore throat Respiratory: Denies cough, dyspnea or wheezes Cardiovascular: Denies palpitation, chest discomfort Gastrointestinal:  Denies nausea, heartburn or change in bowel habits, abdominal distention bloating cramping and discomfort, right abdominal wall lump Skin: Denies abnormal skin rashes Lymphatics: Denies new lymphadenopathy or easy bruising Neurological:numbness tingling on the fingers of her hands bilaterally. Behavioral/Psych: Mood is stable, no new changes  Extremities: No lower extremity edema  All other systems were reviewed with the patient and are negative.  I have reviewed the past medical history, past surgical history, social history and family history with the patient and they are unchanged from previous note.  ALLERGIES:  is allergic to imitrex; sulfonamide derivatives; reglan; zofran; and ondansetron.  MEDICATIONS:  Current Outpatient Prescriptions  Medication Sig Dispense Refill  . acetaminophen (TYLENOL) 500 MG tablet Take 1,000 mg by mouth daily as needed for mild pain.    . clonazePAM (KLONOPIN) 0.5 MG tablet Take 0.5 mg by mouth at bedtime as needed for anxiety. Reported on 10/12/2015    . desvenlafaxine (PRISTIQ) 100 MG 24 hr tablet Take 100 mg by mouth daily.      [name] HYDROcodone-acetaminophen (NORCO/VICODIN) 5-325 MG tablet TK 1 T PO Q 4 TO 6 H PRN P  0  . HYDROmorphone (DILAUDID) 2 MG tablet Take by mouth every 4 (four) hours as needed for severe pain.    [name] lamoTRIgine (LAMICTAL) 100 MG tablet Take 100-200 mg by mouth 2 (two) times daily. Takes 200mg  every morning and 100mg  every night at bedtime.    . lidocaine (XYLOCAINE) 5 %  ointment APPLY 2 GRAMS [4 PUMPS] TO THE AFFECTED AREA 3 TIMES A DAY  12  . metoprolol succinate (TOPROL-XL) 50 MG 24 hr tablet Take  1 tablet by mouth  daily with or immediately  following a meal 30 tablet 0  . NONFORMULARY OR COMPOUNDED ITEM   12  . pantoprazole (PROTONIX) 40 MG tablet Take 1 tablet (40 mg total) by mouth daily. (Patient taking differently: Take 40 mg by mouth daily as needed. ) 90 tablet 3  . tamoxifen (NOLVADEX) 20 MG tablet Take 1 tablet (20 mg total) by mouth daily. 90 tablet 3  . traZODone (DESYREL) 100 MG tablet Take 100 mg by mouth at bedtime.     . valACYclovir (VALTREX) 500 MG tablet Take 500 mg by mouth daily as needed (for breakouts).     [name] VYVANSE 50 MG capsule Take 50 mg by mouth daily.  0   No current facility-administered medications for this visit.   Facility-Administered Medications Ordered in Other Visits  Medication Dose Route Frequency Provider Last Rate Last Dose  . gadopentetate dimeglumine (MAGNEVIST) injection 18 mL  18 mL Intravenous Once PRN Penni Bombard, MD        PHYSICAL EXAMINATION: ECOG PERFORMANCE STATUS: 1 - Symptomatic but completely ambulatory  Filed Vitals:   10/19/15 0857  BP: 124/70  Pulse: 82  Temp: 98.1 F (36.7 C)  Resp: 18   Filed Weights   10/19/15 0857  Weight: 203 lb 11.2 oz (92.398 kg)    GENERAL:alert, no distress and comfortable SKIN: skin color, texture, turgor are normal, no rashes or significant lesions EYES: normal, Conjunctiva are pink and non-injected, sclera clear OROPHARYNX:no exudate, no erythema and lips, buccal mucosa, and tongue normal  NECK: supple, thyroid normal size, non-tender, without nodularity LYMPH:  no palpable lymphadenopathy in the cervical, axillary or inguinal LUNGS: clear to auscultation and percussion with normal breathing effort HEART: regular rate & rhythm and no murmurs and no lower extremity edema ABDOMEN:abdomen soft, non-tender and normal bowel sounds, I believe the abdominal wall lump is actually a lipoma. MUSCULOSKELETAL:no cyanosis of digits and no clubbing  NEURO: alert & oriented x 3 with fluent  speech, no focal motor/sensory deficits EXTREMITIES: No lower extremity edema   LABORATORY DATA:  I have reviewed the data as listed   Chemistry      Component Value Date/Time   NA 139 01/03/2015 1525   NA 144 08/29/2014 1557   K 4.2 01/03/2015 1525   K 4.5 08/29/2014 1557   CL 102 01/03/2015 1525   CO2 33* 01/03/2015 1525   CO2 28 08/29/2014 1557   BUN 13 01/03/2015 1525   BUN 12.4 08/29/2014 1557   CREATININE 0.68 01/03/2015 1525   CREATININE 0.7 08/29/2014 1557      Component Value Date/Time   CALCIUM 9.0 01/03/2015 1525   CALCIUM 9.0 08/29/2014 1557   ALKPHOS 58 01/03/2015 1525   ALKPHOS 74 08/29/2014 1557   AST 19 01/03/2015 1525   AST 18 08/29/2014 1557   ALT 15 01/03/2015 1525   ALT 15 08/29/2014 1557   BILITOT 0.4 01/03/2015 1525   BILITOT 0.48 08/29/2014 1557       Lab Results  Component Value Date   WBC 6.1 01/03/2015   HGB 13.8 01/03/2015   HCT 40.7 01/03/2015   MCV 89.1 01/03/2015   PLT 225.0 01/03/2015   NEUTROABS 4.3 01/03/2015     ASSESSMENT & PLAN:  Malignant neoplasm of upper-outer quadrant of right female breast ([place]) 1.  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  2. LCIS and atypical lobular hyperplasia in the lumpectomy specimen from the left breast. Patient took tamoxifen from 09/16/2014 to 09/06/2015, switched to anastrozole for severe hot flashes.  Anastrozole toxicities: Anastrozole appeared to have caused much worse hot flashes. She also complained of intense pain in her fingers along with numbness. So anastrozole has been discontinued.  Treatment plan: Resume tamoxifen 10 mg daily in February 2017.  Abdominal distention and pain: I do not palpate any clear abnormalities. She is very concerned about breast cancer recurrence. So we elected to obtain a CT abdomen and pelvis with contrast for further evaluation. I will call her with the results of these tests.  Lipoma in the right abdominal wall: Patient felt  discomfort itching and swelling on the right side of the abdominal wall. I palpated it and it felt like a lipoma. I did not recommend any interventions to this.  Return to clinic in 3 months for follow-up.    Orders Placed This Encounter  Procedures  . CT Abdomen Pelvis W Contrast    Standing Status: Future     Number of Occurrences:      Standing Expiration Date: 10/18/2016    Order Specific Question:  If indicated for the ordered procedure, I authorize the administration of contrast media per Radiology protocol    Answer:  Yes    Order Specific Question:  Reason for Exam (SYMPTOM  OR DIAGNOSIS REQUIRED)    Answer:  Abdominal Pain and blaoting with H/O breast cancer    Order Specific Question:  Is the patient pregnant?    Answer:  No    Order Specific Question:  Preferred imaging location?    Answer:  [facility]   The patient has a good understanding of the overall plan. she agrees with it. she will call with any problems that may develop before the next visit here.   Rulon Eisenmenger, MD 10/19/2015

## 2015-10-19 NOTE — Assessment & Plan Note (Signed)
1. 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  2. LCIS and atypical lobular hyperplasia in the lumpectomy specimen from the left breast. Patient took tamoxifen from 09/16/2014 to 09/06/2015, switched to anastrozole for severe hot flashes.  Anastrozole toxicities: Anastrozole appeared to have caused much worse hot flashes. She also complained of intense pain in her fingers along with numbness. So anastrozole has been discontinued.  Treatment plan: Resume tamoxifen 10 mg daily in February 2017.  Abdominal distention and pain: I do not palpate any clear abnormalities. She is very concerned about breast cancer recurrence. So we elected to obtain a CT abdomen and pelvis with contrast for further evaluation. I will call her with the results of these tests.  Lipoma in the right abdominal wall: Patient felt discomfort itching and swelling on the right side of the abdominal wall. I palpated it and it felt like a lipoma. I did not recommend any interventions to this.  Return to clinic in 3 months for follow-up.

## 2015-10-19 NOTE — Addendum Note (Signed)
Addended by: Jonelle Sports K on: 10/19/2015 12:00 PM   Modules accepted: Medications

## 2015-10-23 ENCOUNTER — Encounter: Payer: Self-pay | Admitting: Hematology and Oncology

## 2015-10-23 NOTE — Progress Notes (Signed)
[facility] forms/notes were faxed by nurse 10/19/15.

## 2015-10-27 ENCOUNTER — Ambulatory Visit (HOSPITAL_COMMUNITY): Payer: 59

## 2015-10-30 ENCOUNTER — Ambulatory Visit (HOSPITAL_COMMUNITY): Admission: RE | Admit: 2015-10-30 | Payer: 59 | Source: Ambulatory Visit

## 2015-10-31 ENCOUNTER — Telehealth: Payer: Self-pay

## 2015-10-31 ENCOUNTER — Inpatient Hospital Stay
Admission: RE | Admit: 2015-10-31 | Discharge: 2015-10-31 | Disposition: A | Discharge status: Home / Self Care | Payer: Self-pay | Source: Ambulatory Visit | Attending: Hematology and Oncology | Admitting: Hematology and Oncology

## 2015-10-31 NOTE — Telephone Encounter (Signed)
[facility] request, signed bone density order faxed.  Sent to scan.

## 2015-11-02 ENCOUNTER — Telehealth: Payer: Self-pay

## 2015-11-02 NOTE — Telephone Encounter (Signed)
Return call rcvd from pt about missed CT scans.   LMOVM for pt to return call to Probation officer.

## 2015-11-30 ENCOUNTER — Ambulatory Visit: Payer: Self-pay | Admitting: Hematology and Oncology

## 2015-12-20 ENCOUNTER — Ambulatory Visit (INDEPENDENT_AMBULATORY_CARE_PROVIDER_SITE_OTHER): Payer: 59 | Admitting: Nurse Practitioner

## 2015-12-20 ENCOUNTER — Encounter: Payer: Self-pay | Admitting: Nurse Practitioner

## 2015-12-20 VITALS — BP 100/62 | HR 71 | Temp 98.5°F | Ht 64.0 in | Wt 198.0 lb

## 2015-12-20 DIAGNOSIS — J069 Acute upper respiratory infection, unspecified: Secondary | ICD-10-CM | POA: Diagnosis not present

## 2015-12-20 MED ORDER — FLUTICASONE PROPIONATE 50 MCG/ACT NA SUSP: 2.0000 | Freq: Every day | NASAL | Status: DC

## 2015-12-20 MED ORDER — HYDROCOD POLST-CPM POLST ER 10-8 MG/5ML PO SUER: 5.0000 mL | Freq: Every evening | ORAL | Status: DC | PRN

## 2015-12-20 MED ORDER — AZITHROMYCIN 250 MG PO TABS: ORAL_TABLET | ORAL | Status: DC

## 2015-12-20 NOTE — Progress Notes (Signed)
Pre visit review using our clinic review tool, if applicable. No additional management support is needed unless otherwise documented below in the visit note. 

## 2015-12-20 NOTE — Progress Notes (Signed)
Patient ID: Tamara Cross, female    DOB: 1966-05-18  Age: 50 y.o. MRN: [id number]  CC: Nasal Congestion and Diarrhea   HPI Tamara Cross presents for CC of nasal congestion x 3 days.   1) Pt reports coughing, chest/side pain from coughing  Cough- Declines production  Rhinorrhea- green/clear  Tmax 99 2 days ago   Treatment to date:  Ibuprofen Tylenol  Flonase Claritin   Sick contacts- Husband   History Tamara Cross has a past medical history of Allergy; GERD (gastroesophageal reflux disease); Peptic ulcer disease; Heart murmur; Migraine; Arthritis; Breast cancer ([place]) (03/28/14); Complication of anesthesia; Dysrhythmia; Pneumonia; Depression; Anxiety; MVP (mitral valve prolapse); H/O hiatal hernia; IBS (irritable bowel syndrome); Esophageal stricture; S/P radiation therapy ( 06/28/2014-08/08/2014); and Osteoarthritis.   She has past surgical history that includes Cesarean section (2002); Appendectomy; Tonsillectomy; Cystectomy; Breast lumpectomy (Bilateral); Shoulder surgery (Left); Dilation and curettage of uterus; Breast lumpectomy with needle localization (Bilateral, 05/02/2014); Colposcopy; Novasure ablation; Cervix lesion destruction; Esophagogastroduodenoscopy (N/A, 10/06/2014); Colonoscopy (N/A, 10/06/2014); Cholecystectomy (11/29/2014); and Cholecystectomy (N/A, 11/29/2014).   Her family history includes Colon cancer in her maternal grandmother and maternal uncle; Depression in her brother; Diabetes in her maternal grandfather; Heart disease in her father and maternal grandfather; Hypertension in her father. There is no history of Breast cancer.She reports that she has quit smoking. Her smoking use included Cigarettes. She started smoking about 17 years ago. She has a 2.5 pack-year smoking history. She has never used smokeless tobacco. She reports that she drinks about 3.0 oz of alcohol per week. She reports that she does not use illicit drugs.  Outpatient Prescriptions Prior to Visit   Medication Sig Dispense Refill  . acetaminophen (TYLENOL) 500 MG tablet Take 1,000 mg by mouth daily as needed for mild pain.    [name] desvenlafaxine (PRISTIQ) 100 MG 24 hr tablet Take 100 mg by mouth daily.      [name] lamoTRIgine (LAMICTAL) 100 MG tablet Take 100-200 mg by mouth 2 (two) times daily. Takes 200mg  every morning and 100mg  every night at bedtime.    . lidocaine (XYLOCAINE) 5 % ointment APPLY 2 GRAMS [4 PUMPS] TO THE AFFECTED AREA 3 TIMES A DAY  12  . metoprolol succinate (TOPROL-XL) 50 MG 24 hr tablet Take 1 tablet by mouth  daily with or immediately  following a meal 30 tablet 0  . pantoprazole (PROTONIX) 40 MG tablet Take 1 tablet (40 mg total) by mouth daily. (Patient taking differently: Take 40 mg by mouth daily as needed. ) 90 tablet 3  . tamoxifen (NOLVADEX) 20 MG tablet Take 0.5 tablets (10 mg total) by mouth daily. 90 tablet 3  . traZODone (DESYREL) 100 MG tablet Take 100 mg by mouth at bedtime.     [name] VYVANSE 50 MG capsule Take 50 mg by mouth daily.  0  . valACYclovir (VALTREX) 500 MG tablet Take 500 mg by mouth daily as needed (for breakouts). Reported on 12/20/2015    . clonazePAM (KLONOPIN) 0.5 MG tablet Take 0.5 mg by mouth at bedtime as needed for anxiety. Reported on 12/20/2015    . HYDROcodone-acetaminophen (NORCO/VICODIN) 5-325 MG tablet Reported on 12/20/2015  0  . HYDROmorphone (DILAUDID) 2 MG tablet Take by mouth every 4 (four) hours as needed for severe pain. Reported on 12/20/2015    . NONFORMULARY OR COMPOUNDED ITEM   12   Facility-Administered Medications Prior to Visit  Medication Dose Route Frequency Provider Last Rate Last Dose  . gadopentetate dimeglumine (MAGNEVIST) injection 18 mL  18 mL Intravenous Once PRN Penni Bombard, MD        ROS Review of Systems  Constitutional: Positive for fatigue. Negative for fever, chills and diaphoresis.  HENT: Positive for congestion, postnasal drip, rhinorrhea, sinus pressure, sneezing and sore throat. Negative for ear  pain.   Eyes: Negative for visual disturbance.  Respiratory: Positive for cough. Negative for chest tightness, shortness of breath and wheezing.   Cardiovascular: Negative for chest pain, palpitations and leg swelling.  Gastrointestinal: Negative for nausea, vomiting and diarrhea.  Skin: Negative for rash.  Neurological: Negative for dizziness and headaches.    Objective:  BP 100/62 mmHg  Pulse 71  Temp(Src) 98.5 F (36.9 C) (Oral)  Ht 5\' 4"  (1.626 m)  Wt 198 lb (89.812 kg)  BMI 33.97 kg/m2  SpO2 97%  LMP 04/11/2012  Physical Exam  Constitutional: She is oriented to person, place, and time. She appears well-developed and well-nourished. No distress.  HENT:  Head: Normocephalic and atraumatic.  Right Ear: External ear normal.  Left Ear: External ear normal.  Mouth/Throat: Oropharynx is clear and moist. No oropharyngeal exudate.  TM's clear bilaterally  Eyes: EOM are normal. Pupils are equal, round, and reactive to light. Right eye exhibits no discharge. Left eye exhibits no discharge. No scleral icterus.  Neck: Normal range of motion. Neck supple.  Cardiovascular: Normal rate, regular rhythm and normal heart sounds.  Exam reveals no gallop and no friction rub.   No murmur heard. Pulmonary/Chest: Effort normal and breath sounds normal. No respiratory distress. She has no wheezes. She has no rales. She exhibits no tenderness.  Lymphadenopathy:    She has cervical adenopathy.  Neurological: She is alert and oriented to person, place, and time. No cranial nerve deficit. She exhibits normal muscle tone. Coordination normal.  Skin: Skin is warm and dry. No rash noted. She is not diaphoretic.  Psychiatric: She has a normal mood and affect. Her behavior is normal. Judgment and thought content normal.   Assessment & Plan:   Tamara Cross was seen today for nasal congestion and diarrhea.  Diagnoses and all orders for this visit:  Acute URI  Other orders -     Discontinue: fluticasone  (FLONASE) 50 MCG/ACT nasal spray; Place 2 sprays into both nostrils daily. -     fluticasone (FLONASE) 50 MCG/ACT nasal spray; Place 2 sprays into both nostrils daily. -     chlorpheniramine-HYDROcodone (TUSSIONEX PENNKINETIC ER) 10-8 MG/5ML SUER; Take 5 mLs by mouth at bedtime as needed for cough. -     azithromycin (ZITHROMAX) 250 MG tablet; Take 2 tablets by mouth on day 1, take 1 tablet by mouth each day after for 4 days.  I have discontinued Ms. Lips clonazePAM, HYDROcodone-acetaminophen, NONFORMULARY OR COMPOUNDED ITEM, and HYDROmorphone. I am also having her start on chlorpheniramine-HYDROcodone and azithromycin. Additionally, I am having her maintain her desvenlafaxine, lamoTRIgine, traZODone, valACYclovir, acetaminophen, VYVANSE, pantoprazole, metoprolol succinate, lidocaine, tamoxifen, loratadine, and fluticasone.  Meds ordered this encounter  Medications  . loratadine (CLARITIN) 10 MG tablet    Sig: Take 10 mg by mouth daily.  [name] DISCONTD: fluticasone (FLONASE) 50 MCG/ACT nasal spray    Sig: Place 2 sprays into both nostrils daily.    Dispense:  16 g    Refill:  6    Order Specific Question:  Supervising Provider    Answer:  Deborra Medina L [[id number]]  . fluticasone (FLONASE) 50 MCG/ACT nasal spray    Sig: Place 2 sprays into both nostrils daily.  Dispense:  16 g    Refill:  6    Order Specific Question:  Supervising Provider    Answer:  Deborra Medina L [[id number]]  . chlorpheniramine-HYDROcodone (TUSSIONEX PENNKINETIC ER) 10-8 MG/5ML SUER    Sig: Take 5 mLs by mouth at bedtime as needed for cough.    Dispense:  115 mL    Refill:  0    Has taken in past without concern    Order Specific Question:  Supervising Provider    Answer:  Deborra Medina L [[id number]]  . azithromycin (ZITHROMAX) 250 MG tablet    Sig: Take 2 tablets by mouth on day 1, take 1 tablet by mouth each day after for 4 days.    Dispense:  6 each    Refill:  0    Order Specific Question:  Supervising Provider     Answer:  Crecencio Mc [[id number]]     Follow-up: Return if symptoms worsen or fail to improve.

## 2015-12-20 NOTE — Assessment & Plan Note (Addendum)
New Onset Z-pack was sent to the pharmacy Flonase into the pharmacy Cough syrup- tussionex, printed, signed, and sent to pharmacy  Instructions verbally given and on AVS about drowsy effects Encouraged Probiotics Continue OTC measures  FU prn worsening/failure to improve.

## 2015-12-20 NOTE — Patient Instructions (Signed)
5 mL (1 teaspoon) of cough syrup. Don't drive or make important decisions while taking this....just sleep and rest.   Z-pac as directed  Flonase two sprays in each nostril once daily

## 2015-12-21 ENCOUNTER — Encounter: Payer: Self-pay | Admitting: Hematology and Oncology

## 2015-12-21 NOTE — Progress Notes (Signed)
Sent to medical records [facility] forms faxed 12/21/15

## 2015-12-21 NOTE — Progress Notes (Signed)
faxed to [facility] [phone] and mailed cpy to patient

## 2015-12-21 NOTE — Progress Notes (Signed)
left in pod= placed on dr. gudena's desk to sign °

## 2015-12-30 ENCOUNTER — Other Ambulatory Visit: Payer: Self-pay | Admitting: Internal Medicine

## 2016-01-11 ENCOUNTER — Telehealth: Payer: Self-pay | Admitting: Internal Medicine

## 2016-01-11 ENCOUNTER — Other Ambulatory Visit: Payer: Self-pay | Admitting: *Deleted

## 2016-01-11 MED ORDER — METOPROLOL SUCCINATE ER 50 MG PO TB24: ORAL_TABLET | ORAL | Status: DC

## 2016-01-11 NOTE — Telephone Encounter (Signed)
Patient Name: Tamara Cross  DOB: 08-25-1966    Initial Comment Caller states c/o increased heart rate, elevated blood pressure - needs refill of Toprol 50 mg, hasn't had in 2-3 days   Nurse Assessment  Nurse: Mallie Mussel, RN, Alveta Heimlich Date/Time Eilene Ghazi Time): 01/11/2016 10:15:20 AM  Confirm and document reason for call. If symptomatic, describe symptoms. You must click the next button to save text entered. ---Caller states that she needs a refill of her Topro 50mg  1 PO Dailyl. Her BP has been elevated and she has tachycardia. She states that her BP was 149/90 something earlier. Currently her heart rate is 120 bpm, and she has been having some SOB with exertion lately. She denies chest pain and difficulty breathing. She has tried to repeat her BP, but her machine doesn't take it now for some reason.  Has the patient traveled out of the country within the last 30 days? ---No  Does the patient have any new or worsening symptoms? ---Yes  Will a triage be completed? ---Yes  Related visit to physician within the last 2 weeks? ---No  Does the PT have any chronic conditions? (i.e. diabetes, asthma, etc.) ---Yes  List chronic conditions. ---Neuropathy, HTN, Tachycardia  Is the patient pregnant or possibly pregnant? (Ask all females between the ages of 75-55) ---No  Is this a behavioral health or substance abuse call? ---No     Guidelines    Guideline Title Affirmed Question Affirmed Notes  Heart Rate and Heartbeat Questions New or worsened shortness of breath with activity (dyspnea on exertion)    Final Disposition User   Go to ED Now (or PCP triage) Mallie Mussel, RN, Alveta Heimlich    Comments  Attempted to schedule an appointment at [facility], none were available. Offered to check [place]. Caller states that's almost an hour drive for her and she doesn't want to do that. I provided info on the listed UC and its not close either. She chose to go to [facility] to be seen.   Referrals  [facility] - ED    Disagree/Comply: Comply

## 2016-01-11 NOTE — Telephone Encounter (Signed)
Patient states Dr. Cleda Mccreedy prescribes her metoprolol.  Patient states she has been out of this medication for 2-3 days.  States she is having high BP and heart palpitations.  Patient states she has tried to contact Dr. Rosann Auerbach office but has not gotten anywhere.  She would like to know if Dr. Alain Marion could at least send a few pills to [facility] on [address] Until Dr. Rosann Auerbach office got a chance to get back with her.  I will send patient to [facility] for triage.

## 2016-01-12 MED ORDER — METOPROLOL SUCCINATE ER 50 MG PO TB24: ORAL_TABLET | ORAL | Status: DC

## 2016-01-12 NOTE — Telephone Encounter (Signed)
[facility] - emailed [facility] pls Thx

## 2016-01-12 NOTE — Telephone Encounter (Signed)
Pt advised. She states she got a refill from her Cardiologist 01/11/2016 but she will keep ROV with AVP in June 2017

## 2016-01-24 ENCOUNTER — Telehealth: Payer: Self-pay

## 2016-01-24 ENCOUNTER — Ambulatory Visit: Payer: Self-pay | Admitting: Hematology and Oncology

## 2016-01-24 NOTE — Telephone Encounter (Signed)
LMOVM - pt due for 3 month follow up today.  Pt also missed CT.  Pt to call clinic to reschedule.

## 2016-01-25 ENCOUNTER — Encounter: Payer: Self-pay | Admitting: Hematology and Oncology

## 2016-01-25 NOTE — Progress Notes (Signed)
form was faxed to [phone]

## 2016-01-25 NOTE — Progress Notes (Signed)
left in pod. left form for dr. Lindi Adie to sign-date for return to work 02/04/16

## 2016-02-27 ENCOUNTER — Ambulatory Visit: Payer: Self-pay | Admitting: Hematology and Oncology

## 2016-02-27 NOTE — Assessment & Plan Note (Signed)
1. 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  2. LCIS and atypical lobular hyperplasia in the lumpectomy specimen from the left breast. Patient took tamoxifen from 09/16/2014 to 09/06/2015, switched to anastrozole for severe hot flashes.  Anastrozole toxicities: Anastrozole appeared to have caused much worse hot flashes. She also complained of intense pain in her fingers along with numbness. So anastrozole has been discontinued.  Treatment plan: Resumed tamoxifen 10 mg daily in February 2017.  Abdominal distention and pain: I do not palpate any clear abnormalities. She is very concerned about breast cancer recurrence. I ordered a CT scan of the abdomen but it was never done.  Lipoma in the right abdominal wall: Patient felt discomfort itching and swelling on the right side of the abdominal wall. I palpated it and it felt like a lipoma. I did not recommend any interventions to this.  Return to clinic in 6 months for follow-up.

## 2016-02-29 ENCOUNTER — Ambulatory Visit: Payer: Self-pay | Admitting: Hematology and Oncology

## 2016-02-29 NOTE — Assessment & Plan Note (Deleted)
1. 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  2. LCIS and atypical lobular hyperplasia in the lumpectomy specimen from the left breast. Patient took tamoxifen from 09/16/2014 to 09/06/2015, switched to anastrozole for severe hot flashes. She switched back to tamoxifen February 2017 (due to hot flashes and intense pain in the fingers)  Treatment plan: Resumed tamoxifen 10 mg daily since February 2017.  Abdominal distention and pain: I do not palpate any clear abnormalities. I ordered a CT abdomen and pelvis with contrast for further evaluation. But she did not complete the test.  Return to clinic in 6 months for follow-up.

## 2016-03-12 ENCOUNTER — Encounter: Payer: Self-pay | Admitting: Internal Medicine

## 2016-03-12 NOTE — Progress Notes (Signed)
Patient Care Team: Cassandria Anger, MD as PCP - General (Internal Medicine) Olga Millers, MD as Consulting Physician (Obstetrics and Gynecology) Lafayette Dragon, MD as Consulting Physician (Gastroenterology) Alphonsa Overall, MD as Consulting Physician (General Surgery) Eppie Gibson, MD as Consulting Physician (Radiation Oncology) Nicholas Lose, MD as Consulting Physician (Hematology and Oncology)   HPI  Tamara Cross is a 50 y.o. female Seen in follow-up for palpitations. She was last seen 2-1/2 years ago. Prior to that had been my impression that these were related to sinus tachycardia  Records and Results Reviewed  Past Medical History  Diagnosis Date  . Allergy     chroni rhinitis  . GERD (gastroesophageal reflux disease)   . Peptic ulcer disease   . Heart murmur     MVP  . Migraine   . Arthritis   . 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 Complication of anesthesia     last 2 surgeries had some tachycardia  . Dysrhythmia     tachy occ  . Pneumonia     hx  . Depression   . Anxiety   . MVP (mitral valve prolapse)   . H/O hiatal hernia   . IBS (irritable bowel syndrome)   . Esophageal stricture   . S/P radiation therapy  06/28/2014-08/08/2014     1) Right Breast / 50 Gy in 25 fractions/ 2) Right Breast Boost / 10 Gy in 5 fractions  . Osteoarthritis     Past Surgical History  Procedure Laterality Date  . Cesarean section  2002  . Appendectomy    . Tonsillectomy    . Cystectomy      left breast,in 20's  . Breast lumpectomy Bilateral   . Shoulder surgery Left     debridement ,bone spurs  . Dilation and curettage of uterus      x2  . Breast lumpectomy with needle localization Bilateral 05/02/2014    Procedure: BILATERIAL BREAST LUMPECTOMY WITH NEEDLE LOCALIZATION;  Surgeon: Shann Medal, MD;  Location: [facility];  Service: General;  Laterality: Bilateral;  . Colposcopy    . Novasure ablation    . Cervix  lesion destruction    . Esophagogastroduodenoscopy N/A 10/06/2014    Procedure: ESOPHAGOGASTRODUODENOSCOPY (EGD);  Surgeon: Lafayette Dragon, MD;  Location: [facility];  Service: Endoscopy;  Laterality: N/A;  . Colonoscopy N/A 10/06/2014    Procedure: COLONOSCOPY;  Surgeon: Lafayette Dragon, MD;  Location: [facility];  Service: Endoscopy;  Laterality: N/A;  . Cholecystectomy  11/29/2014    lap chole   . Cholecystectomy N/A 11/29/2014    Procedure: LAPAROSCOPIC CHOLECYSTECTOMY WITH INTRAOPERATIVE CHOLANGIOGRAM;  Surgeon: Doreen Salvage, MD;  Location: [facility];  Service: General;  Laterality: N/A;    Current Outpatient Prescriptions  Medication Sig Dispense Refill  . acetaminophen (TYLENOL) 500 MG tablet Take 1,000 mg by mouth daily as needed for mild pain.    [name] azithromycin (ZITHROMAX) 250 MG tablet Take 2 tablets by mouth on day 1, take 1 tablet by mouth each day after for 4 days. 6 each 0  . chlorpheniramine-HYDROcodone (TUSSIONEX PENNKINETIC ER) 10-8 MG/5ML SUER Take 5 mLs by mouth at bedtime as needed for cough. 115 mL 0  . desvenlafaxine (PRISTIQ) 100 MG 24 hr tablet Take 100 mg by mouth daily.      . fluticasone (FLONASE) 50 MCG/ACT nasal spray Place 2 sprays into both nostrils daily. 16 g 6  .  lamoTRIgine (LAMICTAL) 100 MG tablet Take 100-200 mg by mouth 2 (two) times daily. Takes 200mg  every morning and 100mg  every night at bedtime.    . lidocaine (XYLOCAINE) 5 % ointment APPLY 2 GRAMS [4 PUMPS] TO THE AFFECTED AREA 3 TIMES A DAY  12  . loratadine (CLARITIN) 10 MG tablet Take 10 mg by mouth daily.    . metoprolol succinate (TOPROL-XL) 50 MG 24 hr tablet Take 1 tablet by mouth  daily with or immediately  following a meal 30 tablet 2  . pantoprazole (PROTONIX) 40 MG tablet Take 1 tablet (40 mg total) by mouth daily. (Patient taking differently: Take 40 mg by mouth daily as needed. ) 90 tablet 3  . tamoxifen (NOLVADEX) 20 MG tablet Take 0.5 tablets (10 mg total) by mouth daily. 90 tablet 3  .  traZODone (DESYREL) 100 MG tablet Take 100 mg by mouth at bedtime.     . valACYclovir (VALTREX) 500 MG tablet Take 500 mg by mouth daily as needed (for breakouts). Reported on 12/20/2015    . VYVANSE 50 MG capsule Take 50 mg by mouth daily.  0   No current facility-administered medications for this visit.   Facility-Administered Medications Ordered in Other Visits  Medication Dose Route Frequency Provider Last Rate Last Dose  . gadopentetate dimeglumine (MAGNEVIST) injection 18 mL  18 mL Intravenous Once PRN Penni Bombard, MD        Allergies  Allergen Reactions  . Imitrex [Sumatriptan] Anaphylaxis  . Sulfonamide Derivatives Shortness Of Breath  . Reglan [Metoclopramide] Other (See Comments)    REACTION: Possible cause of SVT.  [name] Zofran [Ondansetron Hcl] Other (See Comments)    REACTION: Possible cause of SVT  . Ondansetron Palpitations      Review of Systems negative except from HPI and PMH  Physical Exam LMP 04/11/2012 Well developed and well nourished in no acute distress HENT normal E scleral and icterus clear Neck Supple JVP flat; carotids brisk and full Clear to ausculation  egular rate and rhythm, no murmurs gallops or rub Soft with active bowel sounds No clubbing cyanosis  Edema Alert and oriented, grossly normal motor and sensory function Skin Warm and Dry    Assessment and  Plan

## 2016-04-02 ENCOUNTER — Other Ambulatory Visit: Payer: Self-pay | Admitting: Internal Medicine

## 2016-04-03 ENCOUNTER — Ambulatory Visit (INDEPENDENT_AMBULATORY_CARE_PROVIDER_SITE_OTHER): Payer: 59 | Admitting: Internal Medicine

## 2016-04-03 ENCOUNTER — Encounter: Payer: Self-pay | Admitting: Internal Medicine

## 2016-04-03 ENCOUNTER — Ambulatory Visit (INDEPENDENT_AMBULATORY_CARE_PROVIDER_SITE_OTHER)
Admission: RE | Admit: 2016-04-03 | Discharge: 2016-04-03 | Disposition: A | Discharge status: Home / Self Care | Payer: 59 | Source: Ambulatory Visit | Attending: Internal Medicine | Admitting: Internal Medicine

## 2016-04-03 VITALS — BP 104/60 | HR 77 | Temp 98.8°F | Wt 194.0 lb

## 2016-04-03 DIAGNOSIS — J45901 Unspecified asthma with (acute) exacerbation: Secondary | ICD-10-CM | POA: Diagnosis not present

## 2016-04-03 DIAGNOSIS — T7840XA Allergy, unspecified, initial encounter: Secondary | ICD-10-CM | POA: Diagnosis not present

## 2016-04-03 DIAGNOSIS — J45909 Unspecified asthma, uncomplicated: Secondary | ICD-10-CM | POA: Insufficient documentation

## 2016-04-03 MED ORDER — FLUTICASONE FUROATE-VILANTEROL 100-25 MCG/INH IN AEPB: 1.0000 | INHALATION_SPRAY | Freq: Every day | RESPIRATORY_TRACT | Status: DC

## 2016-04-03 MED ORDER — CEFDINIR 300 MG PO CAPS: 300.0000 mg | ORAL_CAPSULE | Freq: Two times a day (BID) | ORAL | Status: DC

## 2016-04-03 MED ORDER — HYDROCOD POLST-CPM POLST ER 10-8 MG/5ML PO SUER: 5.0000 mL | Freq: Two times a day (BID) | ORAL | Status: DC | PRN

## 2016-04-03 MED ORDER — METHYLPREDNISOLONE ACETATE 80 MG/ML IJ SUSP
80.0000 mg | Freq: Once | INTRAMUSCULAR | Status: AC
  Administered 2016-04-03: 80 mg via INTRAMUSCULAR

## 2016-04-03 NOTE — Assessment & Plan Note (Signed)
Omnicef x 10 d Depo-medrol 80 mg IM Breo qd Prom-cod syr

## 2016-04-03 NOTE — Patient Instructions (Addendum)
Use over-the-counter  "cold" medicines  such as"Afrin" nasal spray for nasal congestion as directed instead. Use " Delsym" or" Robitussin" cough syrup varietis for cough.  You can use plain "Tylenol" or "Advil" for fever, chills and achyness. Use Halls or Ricola cough drops.  Please, make an appointment if you are not better or if you're worse.  

## 2016-04-03 NOTE — Progress Notes (Signed)
Subjective:  Patient ID: Tamara Cross, female    DOB: November 20, 1965  Age: 50 y.o. MRN: [id number]  CC: Cough; Wheezing; and Headache   HPI Tamara Cross presents for a productive cough and chest congestion with wheezing x 3 weeks  Outpatient Prescriptions Prior to Visit  Medication Sig Dispense Refill  . acetaminophen (TYLENOL) 500 MG tablet Take 1,000 mg by mouth daily as needed for mild pain.    . chlorpheniramine-HYDROcodone (TUSSIONEX PENNKINETIC ER) 10-8 MG/5ML SUER Take 5 mLs by mouth at bedtime as needed for cough. 115 mL 0  . desvenlafaxine (PRISTIQ) 100 MG 24 hr tablet Take 100 mg by mouth daily.      [name] lidocaine (XYLOCAINE) 5 % ointment APPLY 2 GRAMS [4 PUMPS] TO THE AFFECTED AREA 3 TIMES A DAY  12  . metoprolol succinate (TOPROL-XL) 50 MG 24 hr tablet Take 1 tablet by mouth  daily with or immediately  following a meal 30 tablet 2  . pantoprazole (PROTONIX) 40 MG tablet Take 1 tablet (40 mg total) by mouth daily. (Patient taking differently: Take 40 mg by mouth daily as needed. ) 90 tablet 3  . tamoxifen (NOLVADEX) 20 MG tablet Take 0.5 tablets (10 mg total) by mouth daily. 90 tablet 3  . traZODone (DESYREL) 100 MG tablet Take 100 mg by mouth at bedtime.     . valACYclovir (VALTREX) 500 MG tablet Take 500 mg by mouth daily as needed (for breakouts). Reported on 12/20/2015    . VYVANSE 50 MG capsule Take 50 mg by mouth daily.  0  . fluticasone (FLONASE) 50 MCG/ACT nasal spray Place 2 sprays into both nostrils daily. (Patient not taking: Reported on 04/03/2016) 16 g 6  . lamoTRIgine (LAMICTAL) 100 MG tablet Take 100-200 mg by mouth 2 (two) times daily. Reported on 04/03/2016    . loratadine (CLARITIN) 10 MG tablet Take 10 mg by mouth daily. Reported on 04/03/2016    . azithromycin (ZITHROMAX) 250 MG tablet Take 2 tablets by mouth on day 1, take 1 tablet by mouth each day after for 4 days. (Patient not taking: Reported on 04/03/2016) 6 each 0   Facility-Administered Medications Prior to  Visit  Medication Dose Route Frequency Provider Last Rate Last Dose  . gadopentetate dimeglumine (MAGNEVIST) injection 18 mL  18 mL Intravenous Once PRN Penni Bombard, MD        ROS Review of Systems  Constitutional: Negative for chills, activity change, appetite change, fatigue and unexpected weight change.  HENT: Negative for congestion, mouth sores and sinus pressure.   Eyes: Negative for visual disturbance.  Respiratory: Positive for cough and wheezing. Negative for chest tightness and shortness of breath.   Gastrointestinal: Negative for nausea and abdominal pain.  Genitourinary: Negative for frequency, difficulty urinating and vaginal pain.  Musculoskeletal: Negative for back pain and gait problem.  Skin: Negative for pallor and rash.  Neurological: Negative for dizziness, tremors, weakness, numbness and headaches.  Psychiatric/Behavioral: Negative for confusion and sleep disturbance.    Objective:  BP 104/60 mmHg  Pulse 77  Temp(Src) 98.8 F (37.1 C) (Oral)  Wt 194 lb (87.998 kg)  SpO2 97%  LMP 04/11/2012  BP Readings from Last 3 Encounters:  04/03/16 104/60  12/20/15 100/62  10/19/15 124/70    Wt Readings from Last 3 Encounters:  04/03/16 194 lb (87.998 kg)  12/20/15 198 lb (89.812 kg)  10/19/15 203 lb 11.2 oz (92.398 kg)    Physical Exam  Constitutional: She appears well-developed. No  distress.  HENT:  Head: Normocephalic.  Right Ear: External ear normal.  Left Ear: External ear normal.  Nose: Nose normal.  Mouth/Throat: Oropharynx is clear and moist.  Eyes: Conjunctivae are normal. Pupils are equal, round, and reactive to light. Right eye exhibits no discharge. Left eye exhibits no discharge.  Neck: Normal range of motion. Neck supple. No JVD present. No tracheal deviation present. No thyromegaly present.  Cardiovascular: Normal rate, regular rhythm and normal heart sounds.   Pulmonary/Chest: No stridor. No respiratory distress. She has no wheezes.    Abdominal: Soft. Bowel sounds are normal. She exhibits no distension and no mass. There is no tenderness. There is no rebound and no guarding.  Musculoskeletal: She exhibits no edema or tenderness.  Lymphadenopathy:    She has no cervical adenopathy.  Neurological: She displays normal reflexes. No cranial nerve deficit. She exhibits normal muscle tone. Coordination normal.  Skin: No rash noted. No erythema.  Psychiatric: She has a normal mood and affect. Her behavior is normal. Judgment and thought content normal.   B wheezing R>>L  I personally provided Breo inhaler use teaching. After the teaching patient was able to demonstrate it's use effectively. All questions were answered  Lab Results  Component Value Date   WBC 6.1 01/03/2015   HGB 13.8 01/03/2015   HCT 40.7 01/03/2015   PLT 225.0 01/03/2015   GLUCOSE 95 01/03/2015   CHOL 178 08/23/2008   TRIG 47 08/23/2008   HDL 52.7 08/23/2008   LDLCALC 116* 08/23/2008   ALT 15 01/03/2015   AST 19 01/03/2015   NA 139 01/03/2015   K 4.2 01/03/2015   CL 102 01/03/2015   CREATININE 0.68 01/03/2015   BUN 13 01/03/2015   CO2 33* 01/03/2015   TSH 1.050 09/08/2015    No results found.  Assessment & Plan:   There are no diagnoses linked to this encounter. I have discontinued Ms. Pond azithromycin. I am also having her maintain her desvenlafaxine, lamoTRIgine, traZODone, valACYclovir, acetaminophen, VYVANSE, pantoprazole, lidocaine, tamoxifen, loratadine, fluticasone, chlorpheniramine-HYDROcodone, and metoprolol succinate.  No orders of the defined types were placed in this encounter.     Follow-up: No Follow-up on file.  Walker Kehr, MD

## 2016-04-03 NOTE — Assessment & Plan Note (Signed)
Claritin qd 

## 2016-04-03 NOTE — Progress Notes (Signed)
Pre visit review using our clinic review tool, if applicable. No additional management support is needed unless otherwise documented below in the visit note. 

## 2016-04-10 ENCOUNTER — Ambulatory Visit: Payer: Self-pay | Admitting: Internal Medicine

## 2016-04-11 ENCOUNTER — Ambulatory Visit: Payer: 59 | Admitting: Diagnostic Neuroimaging

## 2016-05-10 ENCOUNTER — Ambulatory Visit: Payer: 59 | Admitting: Diagnostic Neuroimaging

## 2016-05-13 ENCOUNTER — Encounter: Payer: Self-pay | Admitting: Diagnostic Neuroimaging

## 2016-05-21 ENCOUNTER — Other Ambulatory Visit: Payer: Self-pay | Admitting: *Deleted

## 2016-05-21 MED ORDER — METOPROLOL SUCCINATE ER 50 MG PO TB24: ORAL_TABLET | ORAL | 11 refills | Status: DC

## 2016-06-20 NOTE — Progress Notes (Signed)
This encounter was created in error - please disregard.

## 2016-07-30 ENCOUNTER — Encounter: Payer: Self-pay | Admitting: Internal Medicine

## 2016-07-30 ENCOUNTER — Ambulatory Visit (INDEPENDENT_AMBULATORY_CARE_PROVIDER_SITE_OTHER): Payer: 59 | Admitting: Internal Medicine

## 2016-07-30 DIAGNOSIS — J4521 Mild intermittent asthma with (acute) exacerbation: Secondary | ICD-10-CM

## 2016-07-30 DIAGNOSIS — J069 Acute upper respiratory infection, unspecified: Secondary | ICD-10-CM | POA: Diagnosis not present

## 2016-07-30 MED ORDER — FLUCONAZOLE 150 MG PO TABS: 150.0000 mg | ORAL_TABLET | Freq: Once | ORAL | 1 refills | Status: AC

## 2016-07-30 MED ORDER — HYDROCOD POLST-CPM POLST ER 10-8 MG/5ML PO SUER: 5.0000 mL | Freq: Two times a day (BID) | ORAL | 0 refills | Status: DC | PRN

## 2016-07-30 MED ORDER — BUDESONIDE-FORMOTEROL FUMARATE 160-4.5 MCG/ACT IN AERO: 2.0000 | INHALATION_SPRAY | Freq: Two times a day (BID) | RESPIRATORY_TRACT | 6 refills | Status: DC

## 2016-07-30 MED ORDER — LEVOFLOXACIN 500 MG PO TABS: 500.0000 mg | ORAL_TABLET | Freq: Every day | ORAL | 0 refills | Status: DC

## 2016-07-30 NOTE — Progress Notes (Signed)
Pre visit review using our clinic review tool, if applicable. No additional management support is needed unless otherwise documented below in the visit note. 

## 2016-07-30 NOTE — Assessment & Plan Note (Signed)
Levaquin po Symbicort MDI Tussionex prn

## 2016-07-30 NOTE — Progress Notes (Signed)
Subjective:  Patient ID: Tamara Cross, female    DOB: 31-Mar-1966  Age: 50 y.o. MRN: [id number]  CC: Cough (x 2 days, yellow sputum); Headache; and Facial Pain   HPI [name] presents for cough and fatigue, HAs, chills x 1 week. Coughing up yellow mucus.  Outpatient Medications Prior to Visit  Medication Sig Dispense Refill  . acetaminophen (TYLENOL) 500 MG tablet Take 1,000 mg by mouth daily as needed for mild pain.    . chlorpheniramine-HYDROcodone (TUSSIONEX PENNKINETIC ER) 10-8 MG/5ML SUER Take 5 mLs by mouth every 12 (twelve) hours as needed for cough. 115 mL 0  . desvenlafaxine (PRISTIQ) 100 MG 24 hr tablet Take 100 mg by mouth daily.      . fluticasone (FLONASE) 50 MCG/ACT nasal spray Place 2 sprays into both nostrils daily. (Patient not taking: Reported on 04/03/2016) 16 g 6  . fluticasone furoate-vilanterol (BREO ELLIPTA) 100-25 MCG/INH AEPB Inhale 1 puff into the lungs daily. 1 each 5  . lamoTRIgine (LAMICTAL) 100 MG tablet Take 100-200 mg by mouth 2 (two) times daily. Reported on 04/03/2016    . lidocaine (XYLOCAINE) 5 % ointment APPLY 2 GRAMS [4 PUMPS] TO THE AFFECTED AREA 3 TIMES A DAY  12  . loratadine (CLARITIN) 10 MG tablet Take 10 mg by mouth daily. Reported on 04/03/2016    . metoprolol succinate (TOPROL-XL) 50 MG 24 hr tablet Take 1 tablet by mouth  daily with or immediately  following a meal 30 tablet 11  . pantoprazole (PROTONIX) 40 MG tablet Take 1 tablet (40 mg total) by mouth daily. (Patient taking differently: Take 40 mg by mouth daily as needed. ) 90 tablet 3  . tamoxifen (NOLVADEX) 20 MG tablet Take 0.5 tablets (10 mg total) by mouth daily. 90 tablet 3  . traZODone (DESYREL) 100 MG tablet Take 100 mg by mouth at bedtime.     . valACYclovir (VALTREX) 500 MG tablet Take 500 mg by mouth daily as needed (for breakouts). Reported on 12/20/2015    . VYVANSE 50 MG capsule Take 50 mg by mouth daily.  0  . cefdinir (OMNICEF) 300 MG capsule Take 1 capsule (300 mg total)  by mouth 2 (two) times daily. 20 capsule 0   Facility-Administered Medications Prior to Visit  Medication Dose Route Frequency Provider Last Rate Last Dose  . gadopentetate dimeglumine (MAGNEVIST) injection 18 mL  18 mL Intravenous Once PRN Penni Bombard, MD        ROS Review of Systems  Constitutional: Positive for fatigue and fever. Negative for activity change, appetite change, chills and unexpected weight change.  HENT: Positive for congestion and rhinorrhea. Negative for mouth sores and sinus pressure.   Eyes: Negative for visual disturbance.  Respiratory: Positive for cough, chest tightness and wheezing.   Gastrointestinal: Negative for abdominal pain and nausea.  Genitourinary: Negative for difficulty urinating, frequency and vaginal pain.  Musculoskeletal: Negative for back pain and gait problem.  Skin: Negative for pallor and rash.  Neurological: Negative for dizziness, tremors, weakness, numbness and headaches.  Psychiatric/Behavioral: Negative for confusion and sleep disturbance.    Objective:  BP 112/72   Pulse 70   Temp 98.4 F (36.9 C) (Oral) Comment: Took Ibuprofen 800 mg today @ 1 AM  Wt 205 lb (93 kg)   LMP 04/11/2012   SpO2 96%   BMI 35.19 kg/m   BP Readings from Last 3 Encounters:  07/30/16 112/72  04/03/16 104/60  12/20/15 100/62    Wt Readings  from Last 3 Encounters:  07/30/16 205 lb (93 kg)  04/03/16 194 lb (88 kg)  12/20/15 198 lb (89.8 kg)    Physical Exam  Constitutional: She appears well-developed. No distress.  HENT:  Head: Normocephalic.  Right Ear: External ear normal.  Left Ear: External ear normal.  Nose: Nose normal.  Mouth/Throat: Oropharynx is clear and moist.  Eyes: Conjunctivae are normal. Pupils are equal, round, and reactive to light. Right eye exhibits no discharge. Left eye exhibits no discharge.  Neck: Normal range of motion. Neck supple. No JVD present. No tracheal deviation present. No thyromegaly present.    Cardiovascular: Normal rate, regular rhythm and normal heart sounds.   Pulmonary/Chest: No stridor. No respiratory distress. She has wheezes.  Abdominal: Soft. Bowel sounds are normal. She exhibits no distension and no mass. There is no tenderness. There is no rebound and no guarding.  Musculoskeletal: She exhibits no edema or tenderness.  Lymphadenopathy:    She has no cervical adenopathy.  Neurological: She displays normal reflexes. No cranial nerve deficit. She exhibits normal muscle tone. Coordination normal.  Skin: No rash noted. No erythema.  Psychiatric: She has a normal mood and affect. Her behavior is normal. Judgment and thought content normal.    Lab Results  Component Value Date   WBC 6.1 01/03/2015   HGB 13.8 01/03/2015   HCT 40.7 01/03/2015   PLT 225.0 01/03/2015   GLUCOSE 95 01/03/2015   CHOL 178 08/23/2008   TRIG 47 08/23/2008   HDL 52.7 08/23/2008   LDLCALC 116 (H) 08/23/2008   ALT 15 01/03/2015   AST 19 01/03/2015   NA 139 01/03/2015   K 4.2 01/03/2015   CL 102 01/03/2015   CREATININE 0.68 01/03/2015   BUN 13 01/03/2015   CO2 33 (H) 01/03/2015   TSH 1.050 09/08/2015    Dg Chest 2 View  Result Date: 04/03/2016 CLINICAL DATA:  Productive cough.  Fever. EXAM: CHEST  2 VIEW COMPARISON:  04/26/2014 . FINDINGS: Mediastinum hilar structures normal. Cardiomegaly with mild pulmonary vascular prominence and interstitial prominence. Very mild component congestive heart failure cannot be excluded. No pleural effusion or pneumothorax. Surgical clips right upper quadrant . IMPRESSION: Cardiomegaly with very mild pulmonary vascular prominence and interstitial prominence. Very mild component congestive heart failure cannot be excluded. No focal infiltrate noted. Electronically Signed   By: Marcello Moores  Register   On: 04/03/2016 10:17    Assessment & Plan:   There are no diagnoses linked to this encounter. I have discontinued Ms. Kruth cefdinir. I am also having her maintain  her desvenlafaxine, lamoTRIgine, traZODone, valACYclovir, acetaminophen, VYVANSE, pantoprazole, lidocaine, tamoxifen, loratadine, fluticasone, fluticasone furoate-vilanterol, chlorpheniramine-HYDROcodone, and metoprolol succinate.  No orders of the defined types were placed in this encounter.    Follow-up: No Follow-up on file.  Walker Kehr, MD

## 2016-07-30 NOTE — Assessment & Plan Note (Signed)
Symbicort MDI Tussionex prn

## 2016-08-05 ENCOUNTER — Telehealth: Payer: Self-pay | Admitting: *Deleted

## 2016-08-05 NOTE — Telephone Encounter (Signed)
Left msg on triage stating she is no better would like refill on antibiotic that md rx...Tamara Cross

## 2016-08-06 MED ORDER — LEVOFLOXACIN 500 MG PO TABS: 500.0000 mg | ORAL_TABLET | Freq: Every day | ORAL | 0 refills | Status: AC

## 2016-08-06 NOTE — Telephone Encounter (Signed)
Notified pt MD ok refill for Levaquin if no better will need f/u appt...Tamara Cross

## 2016-08-06 NOTE — Telephone Encounter (Signed)
OK to ref Levaquin OV if not better Thx

## 2016-09-04 ENCOUNTER — Other Ambulatory Visit (INDEPENDENT_AMBULATORY_CARE_PROVIDER_SITE_OTHER): Payer: 59

## 2016-09-04 ENCOUNTER — Ambulatory Visit (INDEPENDENT_AMBULATORY_CARE_PROVIDER_SITE_OTHER)
Admission: RE | Admit: 2016-09-04 | Discharge: 2016-09-04 | Disposition: A | Discharge status: Home / Self Care | Payer: 59 | Source: Ambulatory Visit | Attending: Nurse Practitioner | Admitting: Nurse Practitioner

## 2016-09-04 ENCOUNTER — Ambulatory Visit (INDEPENDENT_AMBULATORY_CARE_PROVIDER_SITE_OTHER): Payer: 59 | Admitting: Nurse Practitioner

## 2016-09-04 ENCOUNTER — Encounter: Payer: Self-pay | Admitting: Nurse Practitioner

## 2016-09-04 VITALS — BP 118/78 | HR 86 | Temp 98.0°F | Ht 64.0 in | Wt 200.0 lb

## 2016-09-04 DIAGNOSIS — J4531 Mild persistent asthma with (acute) exacerbation: Secondary | ICD-10-CM

## 2016-09-04 DIAGNOSIS — R0602 Shortness of breath: Secondary | ICD-10-CM | POA: Diagnosis not present

## 2016-09-04 LAB — CBC WITH DIFFERENTIAL/PLATELET
Basophils Absolute: 0 10*3/uL (ref 0.0–0.1)
Basophils Relative: 0.4 % (ref 0.0–3.0)
EOS PCT: 1.3 % (ref 0.0–5.0)
Eosinophils Absolute: 0.1 10*3/uL (ref 0.0–0.7)
HCT: 42.9 % (ref 36.0–46.0)
HEMOGLOBIN: 14.6 g/dL (ref 12.0–15.0)
LYMPHS ABS: 1.5 10*3/uL (ref 0.7–4.0)
Lymphocytes Relative: 22.5 % (ref 12.0–46.0)
MCHC: 34.2 g/dL (ref 30.0–36.0)
MCV: 88.6 fl (ref 78.0–100.0)
MONOS PCT: 8.5 % (ref 3.0–12.0)
Monocytes Absolute: 0.6 10*3/uL (ref 0.1–1.0)
Neutro Abs: 4.4 10*3/uL (ref 1.4–7.7)
Neutrophils Relative %: 67.3 % (ref 43.0–77.0)
Platelets: 233 10*3/uL (ref 150.0–400.0)
RBC: 4.84 Mil/uL (ref 3.87–5.11)
RDW: 12.8 % (ref 11.5–15.5)
WBC: 6.5 10*3/uL (ref 4.0–10.5)

## 2016-09-04 LAB — BRAIN NATRIURETIC PEPTIDE: Pro B Natriuretic peptide (BNP): 10 pg/mL (ref 0.0–100.0)

## 2016-09-04 MED ORDER — ALBUTEROL SULFATE (2.5 MG/3ML) 0.083% IN NEBU
2.5000 mg | INHALATION_SOLUTION | Freq: Once | RESPIRATORY_TRACT | Status: AC
  Administered 2016-09-04: 2.5 mg via RESPIRATORY_TRACT

## 2016-09-04 MED ORDER — PREDNISONE 10 MG (21) PO TBPK: 10.0000 mg | ORAL_TABLET | ORAL | 0 refills | Status: DC

## 2016-09-04 MED ORDER — MONTELUKAST SODIUM 10 MG PO TABS: 10.0000 mg | ORAL_TABLET | Freq: Every day | ORAL | 3 refills | Status: DC

## 2016-09-04 MED ORDER — GUAIFENESIN ER 600 MG PO TB12: 600.0000 mg | ORAL_TABLET | Freq: Two times a day (BID) | ORAL | 0 refills | Status: DC | PRN

## 2016-09-04 MED ORDER — METHYLPREDNISOLONE ACETATE 40 MG/ML IJ SUSP
40.0000 mg | Freq: Once | INTRAMUSCULAR | Status: AC
  Administered 2016-09-04: 40 mg via INTRAMUSCULAR

## 2016-09-04 MED ORDER — ALBUTEROL SULFATE HFA 108 (90 BASE) MCG/ACT IN AERS: 2.0000 | INHALATION_SPRAY | Freq: Four times a day (QID) | RESPIRATORY_TRACT | 6 refills | Status: DC | PRN

## 2016-09-04 NOTE — Progress Notes (Signed)
Normal results, see office note

## 2016-09-04 NOTE — Progress Notes (Signed)
Pre visit review using our clinic review tool, if applicable. No additional management support is needed unless otherwise documented below in the visit note. 

## 2016-09-04 NOTE — Patient Instructions (Addendum)
Treating you for acute asthma exacerbation Start oral prednisone tomorrow. Use ventolin as needed for wheezing, and cough.  Normal CXR.  Use flonase, mucinex as prescribed.

## 2016-09-04 NOTE — Progress Notes (Signed)
Subjective:  Patient ID: Tamara Cross, female    DOB: 17-Sep-1966  Age: 50 y.o. MRN: [id number]  CC: Nasal Congestion (congestion,cough yellow/gray mucus for 5 days. saw Plot 2 wks ago-2 round of abx was given. )   Cough  This is a chronic problem. The current episode started more than 1 month ago. The problem has been waxing and waning. The problem occurs constantly. The cough is productive of sputum. Associated symptoms include chest pain, nasal congestion, postnasal drip, rhinorrhea, shortness of breath and wheezing. Pertinent negatives include no chills, fever, headaches or sore throat. The symptoms are aggravated by lying down and cold air. She has tried OTC cough suppressant (levaquin x 2weeks, symbicort) for the symptoms. The treatment provided mild relief. Her past medical history is significant for asthma and environmental allergies.    Outpatient Medications Prior to Visit  Medication Sig Dispense Refill  . acetaminophen (TYLENOL) 500 MG tablet Take 1,000 mg by mouth daily as needed for mild pain.    . budesonide-formoterol (SYMBICORT) 160-4.5 MCG/ACT inhaler Inhale 2 puffs into the lungs 2 (two) times daily. 1 Inhaler 6  . chlorpheniramine-HYDROcodone (TUSSIONEX PENNKINETIC ER) 10-8 MG/5ML SUER Take 5 mLs by mouth every 12 (twelve) hours as needed for cough. 115 mL 0  . desvenlafaxine (PRISTIQ) 100 MG 24 hr tablet Take 100 mg by mouth daily.      [name] lamoTRIgine (LAMICTAL) 100 MG tablet Take 100-200 mg by mouth 2 (two) times daily. Reported on 04/03/2016    . lidocaine (XYLOCAINE) 5 % ointment APPLY 2 GRAMS [4 PUMPS] TO THE AFFECTED AREA 3 TIMES A DAY  12  . metoprolol succinate (TOPROL-XL) 50 MG 24 hr tablet Take 1 tablet by mouth  daily with or immediately  following a meal 30 tablet 11  . pantoprazole (PROTONIX) 40 MG tablet Take 1 tablet (40 mg total) by mouth daily. (Patient taking differently: Take 40 mg by mouth daily as needed. ) 90 tablet 3  . tamoxifen (NOLVADEX) 20 MG tablet  Take 0.5 tablets (10 mg total) by mouth daily. 90 tablet 3  . traZODone (DESYREL) 100 MG tablet Take 100 mg by mouth at bedtime.     . valACYclovir (VALTREX) 500 MG tablet Take 500 mg by mouth daily as needed (for breakouts). Reported on 12/20/2015    . VYVANSE 50 MG capsule Take 50 mg by mouth daily.  0  . fluticasone (FLONASE) 50 MCG/ACT nasal spray Place 2 sprays into both nostrils daily. (Patient not taking: Reported on 09/04/2016) 16 g 6  . loratadine (CLARITIN) 10 MG tablet Take 10 mg by mouth daily. Reported on 04/03/2016     Facility-Administered Medications Prior to Visit  Medication Dose Route Frequency Provider Last Rate Last Dose  . gadopentetate dimeglumine (MAGNEVIST) injection 18 mL  18 mL Intravenous Once PRN Penni Bombard, MD        ROS See HPI  Objective:  BP 118/78   Pulse 86   Temp 98 F (36.7 C)   Ht 5\' 4"  (1.626 m)   Wt 200 lb (90.7 kg)   LMP 04/11/2012   SpO2 98%   BMI 34.33 kg/m   BP Readings from Last 3 Encounters:  09/04/16 118/78  07/30/16 112/72  04/03/16 104/60    Wt Readings from Last 3 Encounters:  09/04/16 200 lb (90.7 kg)  07/30/16 205 lb (93 kg)  04/03/16 194 lb (88 kg)    Physical Exam  Constitutional: She is oriented to person, place, and time.  No distress.  HENT:  Right Ear: Tympanic membrane, external ear and ear canal normal.  Left Ear: Tympanic membrane, external ear and ear canal normal.  Nose: Mucosal edema and rhinorrhea present. Right sinus exhibits no maxillary sinus tenderness and no frontal sinus tenderness. Left sinus exhibits no maxillary sinus tenderness and no frontal sinus tenderness.  Mouth/Throat: Posterior oropharyngeal erythema present. No oropharyngeal exudate.  Cardiovascular: Normal rate and normal heart sounds.   Pulmonary/Chest: She has wheezes. She has rales.  Musculoskeletal: She exhibits no edema.  Neurological: She is alert and oriented to person, place, and time.  Skin: Skin is warm and dry.    Vitals reviewed.   Lab Results  Component Value Date   WBC 6.5 09/04/2016   HGB 14.6 09/04/2016   HCT 42.9 09/04/2016   PLT 233.0 09/04/2016   GLUCOSE 95 01/03/2015   CHOL 178 08/23/2008   TRIG 47 08/23/2008   HDL 52.7 08/23/2008   LDLCALC 116 (H) 08/23/2008   ALT 15 01/03/2015   AST 19 01/03/2015   NA 139 01/03/2015   K 4.2 01/03/2015   CL 102 01/03/2015   CREATININE 0.68 01/03/2015   BUN 13 01/03/2015   CO2 33 (H) 01/03/2015   TSH 1.050 09/08/2015    Dg Chest 2 View  Result Date: 04/03/2016 CLINICAL DATA:  Productive cough.  Fever. EXAM: CHEST  2 VIEW COMPARISON:  04/26/2014 . FINDINGS: Mediastinum hilar structures normal. Cardiomegaly with mild pulmonary vascular prominence and interstitial prominence. Very mild component congestive heart failure cannot be excluded. No pleural effusion or pneumothorax. Surgical clips right upper quadrant . IMPRESSION: Cardiomegaly with very mild pulmonary vascular prominence and interstitial prominence. Very mild component congestive heart failure cannot be excluded. No focal infiltrate noted. Electronically Signed   By: Marcello Moores  Register   On: 04/03/2016 10:17    Assessment & Plan:   [name] was seen today for nasal congestion.  Diagnoses and all orders for this visit:  Mild persistent asthmatic bronchitis with acute exacerbation -     DG Chest 2 View; Future -     CBC w/Diff; Future -     Brain natriuretic peptide; Future -     montelukast (SINGULAIR) 10 MG tablet; Take 1 tablet (10 mg total) by mouth at bedtime. -     albuterol (PROVENTIL HFA;VENTOLIN HFA) 108 (90 Base) MCG/ACT inhaler; Inhale 2 puffs into the lungs every 6 (six) hours as needed for wheezing or shortness of breath. -     predniSONE (STERAPRED UNI-PAK 21 TAB) 10 MG (21) TBPK tablet; Take 1 tablet (10 mg total) by mouth as directed. -     methylPREDNISolone acetate (DEPO-MEDROL) injection 40 mg; Inject 1 mL (40 mg total) into the muscle once. -     guaiFENesin  (MUCINEX) 600 MG 12 hr tablet; Take 1 tablet (600 mg total) by mouth 2 (two) times daily as needed for cough or to loosen phlegm. -     albuterol (PROVENTIL) (2.5 MG/3ML) 0.083% nebulizer solution 2.5 mg; Take 3 mLs (2.5 mg total) by nebulization once.  Shortness of breath -     DG Chest 2 View; Future -     CBC w/Diff; Future -     Brain natriuretic peptide; Future -     montelukast (SINGULAIR) 10 MG tablet; Take 1 tablet (10 mg total) by mouth at bedtime. -     albuterol (PROVENTIL HFA;VENTOLIN HFA) 108 (90 Base) MCG/ACT inhaler; Inhale 2 puffs into the lungs every 6 (six) hours as needed  for wheezing or shortness of breath. -     predniSONE (STERAPRED UNI-PAK 21 TAB) 10 MG (21) TBPK tablet; Take 1 tablet (10 mg total) by mouth as directed. -     methylPREDNISolone acetate (DEPO-MEDROL) injection 40 mg; Inject 1 mL (40 mg total) into the muscle once. -     guaiFENesin (MUCINEX) 600 MG 12 hr tablet; Take 1 tablet (600 mg total) by mouth 2 (two) times daily as needed for cough or to loosen phlegm.   I am having Ms. Rayner start on montelukast, albuterol, predniSONE, and guaiFENesin. I am also having her maintain her desvenlafaxine, lamoTRIgine, traZODone, valACYclovir, acetaminophen, VYVANSE, pantoprazole, lidocaine, tamoxifen, loratadine, fluticasone, metoprolol succinate, chlorpheniramine-HYDROcodone, and budesonide-formoterol. We administered methylPREDNISolone acetate and albuterol.  Meds ordered this encounter  Medications  . montelukast (SINGULAIR) 10 MG tablet    Sig: Take 1 tablet (10 mg total) by mouth at bedtime.    Dispense:  30 tablet    Refill:  3    Order Specific Question:   Supervising Provider    Answer:   Cassandria Anger [[id number]]  . albuterol (PROVENTIL HFA;VENTOLIN HFA) 108 (90 Base) MCG/ACT inhaler    Sig: Inhale 2 puffs into the lungs every 6 (six) hours as needed for wheezing or shortness of breath.    Dispense:  1 Inhaler    Refill:  6    Order Specific Question:    Supervising Provider    Answer:   Cassandria Anger [[id number]]  . predniSONE (STERAPRED UNI-PAK 21 TAB) 10 MG (21) TBPK tablet    Sig: Take 1 tablet (10 mg total) by mouth as directed.    Dispense:  21 tablet    Refill:  0    Order Specific Question:   Supervising Provider    Answer:   Cassandria Anger [[id number]]  . methylPREDNISolone acetate (DEPO-MEDROL) injection 40 mg  . guaiFENesin (MUCINEX) 600 MG 12 hr tablet    Sig: Take 1 tablet (600 mg total) by mouth 2 (two) times daily as needed for cough or to loosen phlegm.    Dispense:  14 tablet    Refill:  0    Order Specific Question:   Supervising Provider    Answer:   Cassandria Anger [[id number]]  . albuterol (PROVENTIL) (2.5 MG/3ML) 0.083% nebulizer solution 2.5 mg    Follow-up: Return if symptoms worsen or fail to improve.  Wilfred Lacy, NP

## 2016-09-13 ENCOUNTER — Telehealth: Payer: Self-pay | Admitting: *Deleted

## 2016-09-13 ENCOUNTER — Other Ambulatory Visit: Payer: Self-pay | Admitting: Nurse Practitioner

## 2016-09-13 DIAGNOSIS — R0602 Shortness of breath: Secondary | ICD-10-CM

## 2016-09-13 DIAGNOSIS — J4531 Mild persistent asthma with (acute) exacerbation: Secondary | ICD-10-CM

## 2016-09-13 NOTE — Telephone Encounter (Signed)
Left msg on triage stating saw Tamara Cross weeks or so ago she had given rx for prednisone. Pt states prednisone seem to be helping with her cough/congestion. She is wanting to get a refill ...Johny Chess

## 2016-09-13 NOTE — Telephone Encounter (Signed)
I will nor recomment repeat oral prednisone. I will recommend use of inhaled steriod (symbicort 2puffs twice a day), and  albuterol prn.  Needs to f/up with Dr. Alain Marion to modify asthma treatment.

## 2016-09-13 NOTE — Telephone Encounter (Signed)
Called pt no answer left [facility] on vm...Tamara Cross

## 2016-10-08 ENCOUNTER — Encounter (HOSPITAL_COMMUNITY): Payer: Self-pay

## 2016-10-08 DIAGNOSIS — Z79899 Other long term (current) drug therapy: Secondary | ICD-10-CM | POA: Diagnosis not present

## 2016-10-08 DIAGNOSIS — Z87891 Personal history of nicotine dependence: Secondary | ICD-10-CM | POA: Diagnosis not present

## 2016-10-08 DIAGNOSIS — R05 Cough: Secondary | ICD-10-CM | POA: Diagnosis not present

## 2016-10-08 DIAGNOSIS — R0602 Shortness of breath: Secondary | ICD-10-CM | POA: Diagnosis not present

## 2016-10-08 DIAGNOSIS — Z853 Personal history of malignant neoplasm of breast: Secondary | ICD-10-CM | POA: Insufficient documentation

## 2016-10-08 DIAGNOSIS — R509 Fever, unspecified: Secondary | ICD-10-CM | POA: Diagnosis present

## 2016-10-08 DIAGNOSIS — R062 Wheezing: Secondary | ICD-10-CM | POA: Diagnosis not present

## 2016-10-08 NOTE — ED Triage Notes (Signed)
Onset 1 month chest congestion, productive cough-green/yellow phlegm, shortness of breath.  Pt has been on several antibiotics and steroids.  Symptoms will get better with medications and when finished, symptoms worsens.  Onset yesterday symptoms worsened.

## 2016-10-08 NOTE — ED Notes (Signed)
Patient stating her right arm is hurting and the pain travels into the jaw.  Will reassess with EKG

## 2016-10-08 NOTE — ED Triage Notes (Signed)
Pt taking Tylenol, Ibuprofen, Excedrin migraine with no relief.

## 2016-10-08 NOTE — ED Notes (Signed)
Nurse first explained delay and process to pt. and family . Vital signs rechecked .

## 2016-10-09 ENCOUNTER — Emergency Department (HOSPITAL_COMMUNITY)
Admission: EM | Admit: 2016-10-09 | Discharge: 2016-10-09 | Disposition: A | Discharge status: Home / Self Care | Payer: 59 | Attending: Emergency Medicine | Admitting: Emergency Medicine

## 2016-10-09 ENCOUNTER — Emergency Department (HOSPITAL_COMMUNITY): Payer: 59

## 2016-10-09 ENCOUNTER — Ambulatory Visit: Payer: Self-pay | Admitting: Internal Medicine

## 2016-10-09 DIAGNOSIS — R69 Illness, unspecified: Secondary | ICD-10-CM

## 2016-10-09 DIAGNOSIS — R05 Cough: Secondary | ICD-10-CM | POA: Diagnosis not present

## 2016-10-09 DIAGNOSIS — R0602 Shortness of breath: Secondary | ICD-10-CM | POA: Diagnosis not present

## 2016-10-09 DIAGNOSIS — J111 Influenza due to unidentified influenza virus with other respiratory manifestations: Secondary | ICD-10-CM

## 2016-10-09 LAB — CBC WITH DIFFERENTIAL/PLATELET
BASOS ABS: 0 10*3/uL (ref 0.0–0.1)
Basophils Relative: 0 %
Eosinophils Absolute: 0 10*3/uL (ref 0.0–0.7)
Eosinophils Relative: 0 %
HEMATOCRIT: 43 % (ref 36.0–46.0)
HEMOGLOBIN: 14.2 g/dL (ref 12.0–15.0)
Lymphocytes Relative: 15 %
Lymphs Abs: 0.8 10*3/uL (ref 0.7–4.0)
MCH: 29.9 pg (ref 26.0–34.0)
MCHC: 33 g/dL (ref 30.0–36.0)
MCV: 90.5 fL (ref 78.0–100.0)
Monocytes Absolute: 0.4 10*3/uL (ref 0.1–1.0)
Monocytes Relative: 8 %
NEUTROS ABS: 4 10*3/uL (ref 1.7–7.7)
NEUTROS PCT: 77 %
Platelets: 231 10*3/uL (ref 150–400)
RBC: 4.75 MIL/uL (ref 3.87–5.11)
RDW: 13.2 % (ref 11.5–15.5)
WBC: 5.2 10*3/uL (ref 4.0–10.5)

## 2016-10-09 LAB — INFLUENZA PANEL BY PCR (TYPE A & B)
INFLBPCR: NEGATIVE
Influenza A By PCR: POSITIVE — AB

## 2016-10-09 LAB — I-STAT TROPONIN, ED: Troponin i, poc: 0 ng/mL (ref 0.00–0.08)

## 2016-10-09 LAB — BASIC METABOLIC PANEL
ANION GAP: 13 (ref 5–15)
BUN: 10 mg/dL (ref 6–20)
CHLORIDE: 104 mmol/L (ref 101–111)
CO2: 22 mmol/L (ref 22–32)
Calcium: 8.7 mg/dL — ABNORMAL LOW (ref 8.9–10.3)
Creatinine, Ser: 0.67 mg/dL (ref 0.44–1.00)
GFR calc non Af Amer: >60 mL/min (ref >60)
Glucose, Bld: 116 mg/dL — ABNORMAL HIGH (ref 65–99)
Potassium: 4 mmol/L (ref 3.5–5.1)
Sodium: 139 mmol/L (ref 135–145)

## 2016-10-09 MED ORDER — OSELTAMIVIR PHOSPHATE 75 MG PO CAPS: 75.0000 mg | ORAL_CAPSULE | Freq: Two times a day (BID) | ORAL | 0 refills | Status: DC

## 2016-10-09 MED ORDER — DEXAMETHASONE SODIUM PHOSPHATE 10 MG/ML IJ SOLN
10.0000 mg | Freq: Once | INTRAMUSCULAR | Status: AC
  Administered 2016-10-09: 10 mg via INTRAVENOUS
  Filled 2016-10-09: qty 1

## 2016-10-09 MED ORDER — KETOROLAC TROMETHAMINE 15 MG/ML IJ SOLN
15.0000 mg | Freq: Once | INTRAMUSCULAR | Status: AC
  Administered 2016-10-09: 15 mg via INTRAVENOUS
  Filled 2016-10-09: qty 1

## 2016-10-09 MED ORDER — PROMETHAZINE HCL 25 MG PO TABS: 25.0000 mg | ORAL_TABLET | Freq: Four times a day (QID) | ORAL | 0 refills | Status: DC | PRN

## 2016-10-09 MED ORDER — IPRATROPIUM-ALBUTEROL 0.5-2.5 (3) MG/3ML IN SOLN
3.0000 mL | Freq: Once | RESPIRATORY_TRACT | Status: AC
  Administered 2016-10-09: 3 mL via RESPIRATORY_TRACT
  Filled 2016-10-09: qty 3

## 2016-10-09 MED ORDER — SODIUM CHLORIDE 0.9 % IV BOLUS (SEPSIS)
1000.0000 mL | Freq: Once | INTRAVENOUS | Status: AC
  Administered 2016-10-09: 1000 mL via INTRAVENOUS

## 2016-10-09 MED ORDER — OXYCODONE-ACETAMINOPHEN 5-325 MG PO TABS
1.0000 | ORAL_TABLET | Freq: Once | ORAL | Status: AC
  Administered 2016-10-09: 1 via ORAL

## 2016-10-09 MED ORDER — DIPHENHYDRAMINE HCL 50 MG/ML IJ SOLN
25.0000 mg | Freq: Once | INTRAMUSCULAR | Status: AC
  Administered 2016-10-09: 25 mg via INTRAVENOUS
  Filled 2016-10-09: qty 1

## 2016-10-09 MED ORDER — IBUPROFEN 600 MG PO TABS: 600.0000 mg | ORAL_TABLET | Freq: Four times a day (QID) | ORAL | 0 refills | Status: DC | PRN

## 2016-10-09 MED ORDER — PROCHLORPERAZINE EDISYLATE 5 MG/ML IJ SOLN
10.0000 mg | Freq: Once | INTRAMUSCULAR | Status: AC
  Administered 2016-10-09: 10 mg via INTRAVENOUS
  Filled 2016-10-09: qty 2

## 2016-10-09 MED ORDER — OXYCODONE-ACETAMINOPHEN 5-325 MG PO TABS
ORAL_TABLET | ORAL | Status: AC
  Filled 2016-10-09: qty 1

## 2016-10-09 NOTE — ED Notes (Signed)
Ambulated pt around [place] per Dr.Horton. Pt O2 started at 95% and dropped down to 92% while ambulating. Pt said that she felt fine being upright and walking. Denies being dizzy, SOB, lightheaded. Pt back in room and currently at 94% on room air. Dr. Dina Rich notified

## 2016-10-09 NOTE — ED Notes (Signed)
Pt is complaining of a severe headache. Pt is questioning if she can receive something for headache.

## 2016-10-09 NOTE — ED Notes (Signed)
EKG done and blood tests ordered , pt. Reported left arm pain radiating to left jaw/left shoulder and upper back .

## 2016-10-09 NOTE — Discharge Instructions (Signed)
You were seen today for fever, cough, headache and flulike symptoms. Your influenza swab is pending. You'll be given a perception for Tamiflu. Otherwise supportive measures at home with nausea medication and anti-inflammatories. If you develop new or worsening symptoms she needs to be reevaluated. You were given a dose of Decadron here which should help any wheezing.

## 2016-10-09 NOTE — ED Provider Notes (Addendum)
[facility] DEPT Provider Note   CSN: [id number] Arrival date & time: 10/08/16  1823  By signing my name below, I, Tamara Cross, attest that this documentation has been prepared under the direction and in the presence of Merryl Hacker, MD. Electronically Signed: Gwenlyn Cross, ED Scribe. 10/09/16. 2:06 AM.   History   Chief Complaint Chief Complaint  Patient presents with  . Fever  . Cough   The history is provided by the patient. No language interpreter was used.   HPI Comments: Tamara Cross is a 51 y.o. female who presents to the Emergency Department complaining of gradual onset, intermittent 10/10 headaches to the frontal and occipital regions onset a few days. Pt states she has had hx of migraine headaches, but states pain is much worse. Pt reports associated fever today (Tmax 102), productive cough, shortness of breath, chest congestion, myalgias, nausea, vomiting and wheezing. Pt has had respiratory symptoms for 1 month and has been prescribed medications that provide only temporary relief. She has tried Ibuprofen, Tylenol, inhalers, cough syrup and Excedrin migraine with no relief. She has multiple inhalers . Pt occasionally smokes and drinks. She has not had her flu shot and has had sick contact with others who have flu-like symptoms. She denies neck stiffness, abdominal pain  Past Medical History:  Diagnosis Date  . Allergy    chroni rhinitis  . Anxiety   . Arthritis   . 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 Complication of anesthesia    last 2 surgeries had some tachycardia  . Depression   . Dysrhythmia    tachy occ  . Esophageal stricture   . GERD (gastroesophageal reflux disease)   . H/O hiatal hernia   . Heart murmur    MVP  . IBS (irritable bowel syndrome)   . Migraine   . MVP (mitral valve prolapse)   . Osteoarthritis   . Peptic ulcer disease   . Pneumonia    hx  . S/P radiation therapy   06/28/2014-08/08/2014    1) Right Breast / 50 Gy in 25 fractions/ 2) Right Breast Boost / 10 Gy in 5 fractions    Patient Active Problem List   Diagnosis Date Noted  . Asthmatic bronchitis with acute exacerbation 04/03/2016  . Acute URI 12/20/2015  . Ataxia 05/30/2015  . Liver lesion, left lobe 01/03/2015  . Gallstone pancreatitis 11/28/2014  . Dysphagia, pharyngoesophageal phase 10/06/2014  . Special screening for malignant neoplasm of colon 10/06/2014  . Dysphagia   . Special screening for malignant neoplasms, colon   . Arthralgia 09/12/2014  . Submuscular lipoma of chest 09/12/2014  . Leg pain 08/29/2014  . Hot flashes due to tamoxifen 08/29/2014  . LBP (low back pain) 04/26/2014  . Rash and nonspecific skin eruption 04/26/2014  . Malignant neoplasm of upper-outer quadrant of right female breast ([place]) 04/13/2014  . Palpitations 11/18/2013  . Allergy   . Heart murmur   . HIP PAIN 09/07/2010  . PEDAL EDEMA 04/26/2010  . EXTERNAL HEMORRHOIDS 01/24/2010  . HIATAL HERNIA 01/24/2010  . Reflux esophagitis 12/19/2009  . GERD 12/19/2009  . Irritable bowel syndrome 12/19/2009  . 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  . Migraine 08/23/2008  . ESOPHAGEAL STRICTURE 03/11/2008  . POLYARTHRALGIA 03/11/2008  . Obesity 05/01/2007  . ANXIETY 05/01/2007  . Chronic depressive disorder 05/01/2007  . PREMATURE VENTRICULAR CONTRACTIONS 05/01/2007  . ALLERGIC RHINITIS 05/01/2007  . MITRAL VALVE PROLAPSE, HX OF 05/01/2007  . PUD, HX  OF 05/01/2007    Past Surgical History:  Procedure Laterality Date  . APPENDECTOMY    . BREAST LUMPECTOMY Bilateral   . BREAST LUMPECTOMY WITH NEEDLE LOCALIZATION Bilateral 05/02/2014   Procedure: BILATERIAL BREAST LUMPECTOMY WITH NEEDLE LOCALIZATION;  Surgeon: Shann Medal, MD;  Location: [facility];  Service: General;  Laterality: Bilateral;  . CERVIX LESION DESTRUCTION    . CESAREAN SECTION  2002  . CHOLECYSTECTOMY  11/29/2014   lap chole   . CHOLECYSTECTOMY N/A  11/29/2014   Procedure: LAPAROSCOPIC CHOLECYSTECTOMY WITH INTRAOPERATIVE CHOLANGIOGRAM;  Surgeon: Doreen Salvage, MD;  Location: [facility];  Service: General;  Laterality: N/A;  . COLONOSCOPY N/A 10/06/2014   Procedure: COLONOSCOPY;  Surgeon: Lafayette Dragon, MD;  Location: [facility];  Service: Endoscopy;  Laterality: N/A;  . COLPOSCOPY    . CYSTECTOMY     left breast,in 20's  . DILATION AND CURETTAGE OF UTERUS     x2  . ESOPHAGOGASTRODUODENOSCOPY N/A 10/06/2014   Procedure: ESOPHAGOGASTRODUODENOSCOPY (EGD);  Surgeon: Lafayette Dragon, MD;  Location: [facility];  Service: Endoscopy;  Laterality: N/A;  . NOVASURE ABLATION    . SHOULDER SURGERY Left    debridement ,bone spurs  . TONSILLECTOMY      OB History    Gravida Para Term Preterm AB Living   1 1           SAB TAB Ectopic Multiple Live Births                  Obstetric Comments   She is not on hormones.  She had her last menstrual period around 2013.       Home Medications    Prior to Admission medications   Medication Sig Start Date End Date Taking? Authorizing Provider  acetaminophen (TYLENOL) 500 MG tablet Take 1,000 mg by mouth daily as needed for mild pain.   Yes Historical Provider, MD  albuterol (PROVENTIL HFA;VENTOLIN HFA) 108 (90 Base) MCG/ACT inhaler Inhale 2 puffs into the lungs every 6 (six) hours as needed for wheezing or shortness of breath. 09/04/16  Yes Flossie Buffy, NP  aspirin-acetaminophen-caffeine (EXCEDRIN MIGRAINE) [phone] MG tablet Take 1 tablet by mouth every 6 (six) hours as needed for headache.   Yes Historical Provider, MD  budesonide-formoterol (SYMBICORT) 160-4.5 MCG/ACT inhaler Inhale 2 puffs into the lungs 2 (two) times daily. 07/30/16  Yes Cassandria Anger, MD  chlorpheniramine-HYDROcodone (TUSSIONEX PENNKINETIC ER) 10-8 MG/5ML SUER Take 5 mLs by mouth every 12 (twelve) hours as needed for cough. 07/30/16  Yes Evie Lacks Plotnikov, MD  desvenlafaxine (PRISTIQ) 100 MG 24 hr tablet Take 100 mg  by mouth daily.     Yes Historical Provider, MD  guaiFENesin (MUCINEX) 600 MG 12 hr tablet Take 1 tablet (600 mg total) by mouth 2 (two) times daily as needed for cough or to loosen phlegm. 09/04/16  Yes Charlene Brooke Nche, NP  lamoTRIgine (LAMICTAL) 100 MG tablet Take 100-200 mg by mouth See admin instructions. Take two tablets by mouth in the morning then take one in the evening   Yes Historical Provider, MD  metoprolol succinate (TOPROL-XL) 50 MG 24 hr tablet Take 1 tablet by mouth  daily with or immediately  following a meal 05/21/16  Yes Deboraha Sprang, MD  montelukast (SINGULAIR) 10 MG tablet Take 1 tablet (10 mg total) by mouth at bedtime. 09/04/16  Yes Charlene Brooke Nche, NP  pantoprazole (PROTONIX) 40 MG tablet Take 1 tablet (40 mg total) by mouth daily. Patient  taking differently: Take 40 mg by mouth daily as needed.  01/30/15  Yes Evie Lacks Plotnikov, MD  traZODone (DESYREL) 100 MG tablet Take 100 mg by mouth at bedtime as needed for sleep.  05/03/13  Yes Historical Provider, MD  valACYclovir (VALTREX) 500 MG tablet Take 500 mg by mouth daily as needed (for breakouts). Reported on 12/20/2015   Yes Historical Provider, MD  VYVANSE 50 MG capsule Take 50 mg by mouth daily. 11/06/14  Yes Historical Provider, MD  fluticasone (FLONASE) 50 MCG/ACT nasal spray Place 2 sprays into both nostrils daily. Patient not taking: Reported on 10/08/2016 12/20/15   Rubbie Battiest, NP  ibuprofen (ADVIL,MOTRIN) 600 MG tablet Take 1 tablet (600 mg total) by mouth every 6 (six) hours as needed. 10/09/16   Merryl Hacker, MD  oseltamivir (TAMIFLU) 75 MG capsule Take 1 capsule (75 mg total) by mouth every 12 (twelve) hours. 10/09/16   Merryl Hacker, MD  predniSONE (STERAPRED UNI-PAK 21 TAB) 10 MG (21) TBPK tablet Take 1 tablet (10 mg total) by mouth as directed. Patient not taking: Reported on 10/08/2016 09/04/16   Flossie Buffy, NP  promethazine (PHENERGAN) 25 MG tablet Take 1 tablet (25 mg total) by mouth every 6  (six) hours as needed for nausea or vomiting. 10/09/16   Merryl Hacker, MD  tamoxifen (NOLVADEX) 20 MG tablet Take 0.5 tablets (10 mg total) by mouth daily. Patient not taking: Reported on 10/08/2016 10/19/15   Nicholas Lose, MD    Family History Family History  Problem Relation Age of Onset  . Heart disease Father   . Hypertension Father   . Colon cancer Maternal Grandmother   . Depression Brother   . Diabetes Maternal Grandfather   . Heart disease Maternal Grandfather   . Colon cancer Maternal Uncle     x 2  . Colon polyps      x 9 maternal uncles/aunts  . Breast cancer Neg Hx     Social History Social History  Substance Use Topics  . Smoking status: Former Smoker    Packs/day: 0.25    Years: 10.00    Types: Cigarettes    Start date: 02/23/1998  . Smokeless tobacco: Never Used  . Alcohol use 3.0 oz/week    5 Glasses of wine per week     Comment: socially     Allergies   Imitrex [sumatriptan]; Sulfonamide derivatives; Reglan [metoclopramide]; Zofran [ondansetron hcl]; and Ondansetron   Review of Systems Review of Systems  Constitutional: Positive for fever.  HENT: Positive for congestion.   Respiratory: Positive for cough, shortness of breath and wheezing.   Gastrointestinal: Positive for nausea and vomiting. Negative for abdominal pain.  Musculoskeletal: Positive for arthralgias. Negative for neck pain and neck stiffness.  Neurological: Positive for weakness and headaches.  All other systems reviewed and are negative.  Physical Exam Updated Vital Signs BP 124/62 (BP Location: Left Arm)   Pulse 75   Temp 100 F (37.8 C) (Oral)   Resp 18   LMP 04/11/2012   SpO2 97%   Physical Exam  Constitutional: She is oriented to person, place, and time. She appears well-developed and well-nourished. No distress.  HENT:  Head: Normocephalic and atraumatic.  Tenderness to palpation frontal and maxillary sinuses  Eyes: EOM are normal. Pupils are equal, round, and  reactive to light.  Neck: Normal range of motion. Neck supple.  No meningismus  Cardiovascular: Normal rate, regular rhythm and normal heart sounds.   Pulmonary/Chest: Effort normal.  No respiratory distress. She has wheezes.  Diffuse expiratory wheezing, fair air movement  Abdominal: Soft. Bowel sounds are normal. There is no tenderness. There is no guarding.  Musculoskeletal: She exhibits no edema.  Neurological: She is alert and oriented to person, place, and time.  Skin: Skin is warm and dry.  Psychiatric: She has a normal mood and affect.  Nursing note and vitals reviewed.    ED Treatments / Results  DIAGNOSTIC STUDIES: Oxygen Saturation is 97% on RA, normal by my interpretation.    COORDINATION OF CARE: 1:26 AM Discussed treatment plan with pt at bedside which includes Compazine, Benadryl, Toradol and Influenza panel and pt agreed to plan.  Labs (all labs ordered are listed, but only abnormal results are displayed) Labs Reviewed  BASIC METABOLIC PANEL - Abnormal; Notable for the following:       Result Value   Glucose, Bld 116 (*)    Calcium 8.7 (*)    All other components within normal limits  CBC WITH DIFFERENTIAL/PLATELET  INFLUENZA PANEL BY PCR (TYPE A & B, H1N1)  I-STAT TROPOININ, ED    EKG  EKG Interpretation  Date/Time:  Tuesday October 08 2016 23:42:02 EST Ventricular Rate:  80 PR Interval:  140 QRS Duration: 74 QT Interval:  364 QTC Calculation: 419 R Axis:   44 Text Interpretation:  Normal sinus rhythm Low voltage QRS Cannot rule out Anterior infarct , age undetermined Abnormal ECG Confirmed by Dina Rich  MD, Lenea Bywater ([id number]) on 10/09/2016 2:44:08 AM       Radiology Dg Chest 2 View  Result Date: 10/09/2016 CLINICAL DATA:  One month chest congestion, productive cough and shortness of breath. History of breast cancer. EXAM: CHEST  2 VIEW COMPARISON:  Chest radiograph September 04, 2016 FINDINGS: Cardiomediastinal silhouette is normal. No pleural effusions or  focal consolidations. Mild bronchitic changes. Trachea projects midline and there is no pneumothorax. Soft tissue planes and included osseous structures are non-suspicious. Surgical clips in the included right abdomen compatible with cholecystectomy. IMPRESSION: Mild bronchitic changes without focal consolidation . Electronically Signed   By: Elon Alas M.D.   On: 10/09/2016 02:15    Procedures Procedures (including critical care time)  Medications Ordered in ED Medications  oxyCODONE-acetaminophen (PERCOCET/ROXICET) 5-325 MG per tablet 1 tablet (1 tablet Oral Given 10/09/16 0020)  sodium chloride 0.9 % bolus 1,000 mL (0 mLs Intravenous Stopped 10/09/16 0344)  prochlorperazine (COMPAZINE) injection 10 mg (10 mg Intravenous Given 10/09/16 0202)  diphenhydrAMINE (BENADRYL) injection 25 mg (25 mg Intravenous Given 10/09/16 0202)  ketorolac (TORADOL) 15 MG/ML injection 15 mg (15 mg Intravenous Given 10/09/16 0201)  ipratropium-albuterol (DUONEB) 0.5-2.5 (3) MG/3ML nebulizer solution 3 mL (3 mLs Nebulization Given 10/09/16 0252)  dexamethasone (DECADRON) injection 10 mg (10 mg Intravenous Given 10/09/16 0254)     Initial Impression / Assessment and Plan / ED Course  I have reviewed the triage vital signs and the nursing notes.  Pertinent labs & imaging results that were available during my care of the patient were reviewed by me and considered in my medical decision making (see chart for details).  Clinical Course     Patient presents with multiple complaints including fever, myalgia, headache, cough, nausea, vomiting. She is nontoxic. Initially afebrile. MAXIMUM TEMPERATURE 100. Does not appear septic. Basic labwork obtained and reassuring. Suspect acute viral syndrome versus flu. Patient was given a migraine cocktail and fluids.  She was also given a duo neb and Decadron for her wheezing.  4:12 AM Patient states that she  feels much better. She is able to ambulate and maintain pulse ox. Of note,  blood pressure now 95 systolic. Normal blood pressure in the 123456 systolic. Patient is asymptomatic. She has received several medications that could affect her blood pressure. Given that she is asymptomatic, will discharge with Tamiflu, phenergan and ibuprofen for symptom management.  After history, exam, and medical workup I feel the patient has been appropriately medically screened and is safe for discharge home. Pertinent diagnoses were discussed with the patient. Patient was given return precautions.  8:02 PM  Flu result: Flu A positive Spoke with patient to inform of results .  Patient taking Tamiflu.  Final Clinical Impressions(s) / ED Diagnoses   Final diagnoses:  Influenza-like illness    New Prescriptions New Prescriptions   IBUPROFEN (ADVIL,MOTRIN) 600 MG TABLET    Take 1 tablet (600 mg total) by mouth every 6 (six) hours as needed.   OSELTAMIVIR (TAMIFLU) 75 MG CAPSULE    Take 1 capsule (75 mg total) by mouth every 12 (twelve) hours.   PROMETHAZINE (PHENERGAN) 25 MG TABLET    Take 1 tablet (25 mg total) by mouth every 6 (six) hours as needed for nausea or vomiting.   I personally performed the services described in this documentation, which was scribed in my presence. The recorded information has been reviewed and is accurate.    Merryl Hacker, MD 10/09/16 [id number]    Merryl Hacker, MD 10/10/16 2007

## 2016-10-11 ENCOUNTER — Ambulatory Visit: Payer: Self-pay | Admitting: Nurse Practitioner

## 2016-10-15 ENCOUNTER — Ambulatory Visit (INDEPENDENT_AMBULATORY_CARE_PROVIDER_SITE_OTHER): Payer: 59 | Admitting: Internal Medicine

## 2016-10-15 ENCOUNTER — Encounter: Payer: Self-pay | Admitting: Internal Medicine

## 2016-10-15 VITALS — BP 90/62 | HR 77 | Temp 98.6°F | Wt 199.0 lb

## 2016-10-15 DIAGNOSIS — J3089 Other allergic rhinitis: Secondary | ICD-10-CM | POA: Diagnosis not present

## 2016-10-15 DIAGNOSIS — J320 Chronic maxillary sinusitis: Secondary | ICD-10-CM | POA: Diagnosis not present

## 2016-10-15 DIAGNOSIS — R0602 Shortness of breath: Secondary | ICD-10-CM

## 2016-10-15 DIAGNOSIS — J4531 Mild persistent asthma with (acute) exacerbation: Secondary | ICD-10-CM | POA: Diagnosis not present

## 2016-10-15 DIAGNOSIS — J329 Chronic sinusitis, unspecified: Secondary | ICD-10-CM | POA: Insufficient documentation

## 2016-10-15 MED ORDER — AZITHROMYCIN 250 MG PO TABS: ORAL_TABLET | ORAL | 0 refills | Status: DC

## 2016-10-15 MED ORDER — PREDNISONE 10 MG PO TABS: ORAL_TABLET | ORAL | 1 refills | Status: DC

## 2016-10-15 MED ORDER — MONTELUKAST SODIUM 10 MG PO TABS: 10.0000 mg | ORAL_TABLET | Freq: Every day | ORAL | 11 refills | Status: DC

## 2016-10-15 NOTE — Assessment & Plan Note (Signed)
Singulair po Allergy testing ref

## 2016-10-15 NOTE — Progress Notes (Signed)
Pre visit review using our clinic review tool, if applicable. No additional management support is needed unless otherwise documented below in the visit note. 

## 2016-10-15 NOTE — Progress Notes (Signed)
Subjective:  Patient ID: Tamara Cross, female    DOB: December 02, 1965  Age: 51 y.o. MRN: [id number]  CC: No chief complaint on file.   HPI Tamara Cross presents for cough and wheezing since June 2017. C/o nausea and diarrhea x 3 d. Last abx - October 2017. C/o sinus issues, brown d/c at times   Outpatient Medications Prior to Visit  Medication Sig Dispense Refill  . acetaminophen (TYLENOL) 500 MG tablet Take 1,000 mg by mouth daily as needed for mild pain.    [name] albuterol (PROVENTIL HFA;VENTOLIN HFA) 108 (90 Base) MCG/ACT inhaler Inhale 2 puffs into the lungs every 6 (six) hours as needed for wheezing or shortness of breath. 1 Inhaler 6  . aspirin-acetaminophen-caffeine (EXCEDRIN MIGRAINE) 250-250-65 MG tablet Take 1 tablet by mouth every 6 (six) hours as needed for headache.    . budesonide-formoterol (SYMBICORT) 160-4.5 MCG/ACT inhaler Inhale 2 puffs into the lungs 2 (two) times daily. 1 Inhaler 6  . chlorpheniramine-HYDROcodone (TUSSIONEX PENNKINETIC ER) 10-8 MG/5ML SUER Take 5 mLs by mouth every 12 (twelve) hours as needed for cough. 115 mL 0  . desvenlafaxine (PRISTIQ) 100 MG 24 hr tablet Take 100 mg by mouth daily.      . fluticasone (FLONASE) 50 MCG/ACT nasal spray Place 2 sprays into both nostrils daily. 16 g 6  . guaiFENesin (MUCINEX) 600 MG 12 hr tablet Take 1 tablet (600 mg total) by mouth 2 (two) times daily as needed for cough or to loosen phlegm. 14 tablet 0  . ibuprofen (ADVIL,MOTRIN) 600 MG tablet Take 1 tablet (600 mg total) by mouth every 6 (six) hours as needed. 30 tablet 0  . lamoTRIgine (LAMICTAL) 100 MG tablet Take 100-200 mg by mouth See admin instructions. Take two tablets by mouth in the morning then take one in the evening    . metoprolol succinate (TOPROL-XL) 50 MG 24 hr tablet Take 1 tablet by mouth  daily with or immediately  following a meal 30 tablet 11  . montelukast (SINGULAIR) 10 MG tablet Take 1 tablet (10 mg total) by mouth at bedtime. 30 tablet 3  .  pantoprazole (PROTONIX) 40 MG tablet Take 1 tablet (40 mg total) by mouth daily. (Patient taking differently: Take 40 mg by mouth daily as needed. ) 90 tablet 3  . promethazine (PHENERGAN) 25 MG tablet Take 1 tablet (25 mg total) by mouth every 6 (six) hours as needed for nausea or vomiting. 30 tablet 0  . tamoxifen (NOLVADEX) 20 MG tablet Take 0.5 tablets (10 mg total) by mouth daily. 90 tablet 3  . traZODone (DESYREL) 100 MG tablet Take 100 mg by mouth at bedtime as needed for sleep.     . valACYclovir (VALTREX) 500 MG tablet Take 500 mg by mouth daily as needed (for breakouts). Reported on 12/20/2015    . VYVANSE 50 MG capsule Take 50 mg by mouth daily.  0  . oseltamivir (TAMIFLU) 75 MG capsule Take 1 capsule (75 mg total) by mouth every 12 (twelve) hours. 10 capsule 0  . predniSONE (STERAPRED UNI-PAK 21 TAB) 10 MG (21) TBPK tablet Take 1 tablet (10 mg total) by mouth as directed. (Patient not taking: Reported on 10/15/2016) 21 tablet 0   Facility-Administered Medications Prior to Visit  Medication Dose Route Frequency Provider Last Rate Last Dose  . gadopentetate dimeglumine (MAGNEVIST) injection 18 mL  18 mL Intravenous Once PRN Penni Bombard, MD        ROS Review of Systems  Constitutional:  Positive for fatigue. Negative for activity change, appetite change, chills and unexpected weight change.  HENT: Positive for postnasal drip, rhinorrhea, sinus pain, sinus pressure, sore throat and voice change. Negative for congestion and mouth sores.   Eyes: Negative for visual disturbance.  Respiratory: Positive for cough, shortness of breath and wheezing. Negative for chest tightness.   Gastrointestinal: Negative for abdominal pain and nausea.  Genitourinary: Negative for difficulty urinating, frequency and vaginal pain.  Musculoskeletal: Positive for arthralgias. Negative for back pain and gait problem.  Skin: Negative for pallor and rash.  Neurological: Negative for dizziness, tremors,  weakness, numbness and headaches.  Psychiatric/Behavioral: Negative for confusion and sleep disturbance.    Objective:  BP 90/62   Pulse 77   Temp 98.6 F (37 C) (Oral)   Wt 199 lb (90.3 kg)   LMP 04/11/2012   SpO2 92%   BMI 34.16 kg/m   BP Readings from Last 3 Encounters:  10/15/16 90/62  10/09/16 (!) 96/53  09/04/16 118/78    Wt Readings from Last 3 Encounters:  10/15/16 199 lb (90.3 kg)  09/04/16 200 lb (90.7 kg)  07/30/16 205 lb (93 kg)    Physical Exam  Constitutional: She appears well-developed. No distress.  HENT:  Head: Normocephalic.  Right Ear: External ear normal.  Left Ear: External ear normal.  Mouth/Throat: Oropharynx is clear and moist.  Eyes: Conjunctivae are normal. Pupils are equal, round, and reactive to light. Right eye exhibits no discharge. Left eye exhibits no discharge.  Neck: Normal range of motion. Neck supple. No JVD present. No tracheal deviation present. No thyromegaly present.  Cardiovascular: Normal rate, regular rhythm and normal heart sounds.   Pulmonary/Chest: No stridor. No respiratory distress. She has wheezes.  Abdominal: Soft. Bowel sounds are normal. She exhibits no distension and no mass. There is no tenderness. There is no rebound and no guarding.  Musculoskeletal: She exhibits no edema or tenderness.  Lymphadenopathy:    She has no cervical adenopathy.  Neurological: She displays normal reflexes. No cranial nerve deficit. She exhibits normal muscle tone. Coordination normal.  Skin: No rash noted. No erythema.  Psychiatric: She has a normal mood and affect. Her behavior is normal. Judgment and thought content normal.  eryth nasal mucosa  Lab Results  Component Value Date   WBC 5.2 10/09/2016   HGB 14.2 10/09/2016   HCT 43.0 10/09/2016   PLT 231 10/09/2016   GLUCOSE 116 (H) 10/09/2016   CHOL 178 08/23/2008   TRIG 47 08/23/2008   HDL 52.7 08/23/2008   LDLCALC 116 (H) 08/23/2008   ALT 15 01/03/2015   AST 19 01/03/2015    NA 139 10/09/2016   K 4.0 10/09/2016   CL 104 10/09/2016   CREATININE 0.67 10/09/2016   BUN 10 10/09/2016   CO2 22 10/09/2016   TSH 1.050 09/08/2015    Dg Chest 2 View  Result Date: 10/09/2016 CLINICAL DATA:  One month chest congestion, productive cough and shortness of breath. History of breast cancer. EXAM: CHEST  2 VIEW COMPARISON:  Chest radiograph September 04, 2016 FINDINGS: Cardiomediastinal silhouette is normal. No pleural effusions or focal consolidations. Mild bronchitic changes. Trachea projects midline and there is no pneumothorax. Soft tissue planes and included osseous structures are non-suspicious. Surgical clips in the included right abdomen compatible with cholecystectomy. IMPRESSION: Mild bronchitic changes without focal consolidation . Electronically Signed   By: Elon Alas M.D.   On: 10/09/2016 02:15    Assessment & Plan:   There are no diagnoses  linked to this encounter. I have discontinued Ms. Striano predniSONE and oseltamivir. I am also having her maintain her desvenlafaxine, lamoTRIgine, traZODone, valACYclovir, acetaminophen, VYVANSE, pantoprazole, tamoxifen, fluticasone, metoprolol succinate, chlorpheniramine-HYDROcodone, budesonide-formoterol, montelukast, albuterol, guaiFENesin, aspirin-acetaminophen-caffeine, promethazine, and ibuprofen.  No orders of the defined types were placed in this encounter.    Follow-up: No Follow-up on file.  Walker Kehr, MD

## 2016-10-15 NOTE — Assessment & Plan Note (Signed)
Zpac Prednisone 10 mg: take 4 tabs a day x 3 days; then 3 tabs a day x 4 days; then 2 tabs a day x 4 days, then 1 tab a day x 6 days, then stop. Take pc. CXR was ok Sinus CT Pulm ref dr Neldon Mc Cont Symbicort, singulair

## 2016-10-15 NOTE — Assessment & Plan Note (Signed)
Limited sinus CT Allergy ref placed - Dr Neldon Mc

## 2016-10-22 ENCOUNTER — Inpatient Hospital Stay: Admission: RE | Admit: 2016-10-22 | Payer: Self-pay | Source: Ambulatory Visit

## 2017-06-03 DIAGNOSIS — M25511 Pain in right shoulder: Secondary | ICD-10-CM | POA: Diagnosis not present

## 2017-06-03 DIAGNOSIS — M545 Low back pain: Secondary | ICD-10-CM | POA: Diagnosis not present

## 2017-06-07 ENCOUNTER — Other Ambulatory Visit: Payer: Self-pay | Admitting: Internal Medicine

## 2017-06-10 NOTE — Telephone Encounter (Signed)
Please advise on refill request as patient has not been seen here in over three years. Thanks, MI

## 2017-06-10 NOTE — Telephone Encounter (Signed)
May refill # 30 w/ 0 refills without an office visit or she will need to have this filled by her PCP.   Thanks!

## 2017-06-11 ENCOUNTER — Other Ambulatory Visit: Payer: Self-pay | Admitting: Internal Medicine

## 2017-06-12 NOTE — Telephone Encounter (Signed)
Tamara Cross  06/07/2017  Refill  MRN:  [id number]  Description: 51 year old female Provider: Deboraha Sprang, MD Department: [facility] Office  Call Documentation   Emily Filbert, RN at 06/10/2017 5:49 PM   Status: Signed    May refill # 30 w/ 0 refills without an office visit or she will need to have this filled by her PCP.   Thanks!    Juventino Slovak, CMA at 06/10/2017 3:42 PM   Status: Signed    Please advise on refill request as patient has not been seen here in over three years. Thanks, MI    Encounter MyChart Messages   No messages in this encounter  Approved    Disp Refills Start End  metoprolol succinate (TOPROL-XL) 50 MG 24 hr tablet 30 tablet 0 06/11/2017   Sig:  Take 1 tablet by mouth daily with or immediately following a meal PATIENT MUST HAVE AN APPOINTMENT FOR FURTHER REFILLS OR REFILL WITH PCP  Class:  Normal  DAW:  No  Authorizing Provider:  Deboraha Sprang, MD  Ordering User:  Juventino Slovak, CMA

## 2017-07-15 ENCOUNTER — Other Ambulatory Visit: Payer: Self-pay | Admitting: Internal Medicine

## 2017-07-24 ENCOUNTER — Other Ambulatory Visit: Payer: Self-pay | Admitting: Internal Medicine

## 2017-07-29 ENCOUNTER — Ambulatory Visit: Payer: Self-pay | Admitting: Family Medicine

## 2017-07-30 ENCOUNTER — Ambulatory Visit (INDEPENDENT_AMBULATORY_CARE_PROVIDER_SITE_OTHER): Payer: 59 | Admitting: Family Medicine

## 2017-07-30 ENCOUNTER — Encounter: Payer: Self-pay | Admitting: Family Medicine

## 2017-07-30 VITALS — BP 122/76 | HR 82 | Temp 98.6°F | Ht 64.0 in | Wt 207.0 lb

## 2017-07-30 DIAGNOSIS — J069 Acute upper respiratory infection, unspecified: Secondary | ICD-10-CM

## 2017-07-30 DIAGNOSIS — J320 Chronic maxillary sinusitis: Secondary | ICD-10-CM | POA: Diagnosis not present

## 2017-07-30 MED ORDER — PREDNISONE 5 MG PO TABS: ORAL_TABLET | ORAL | 0 refills | Status: DC

## 2017-07-30 MED ORDER — AZITHROMYCIN 250 MG PO TABS: ORAL_TABLET | ORAL | 0 refills | Status: DC

## 2017-07-30 NOTE — Patient Instructions (Signed)
Thank you for coming in,   Please try things such as zyrtec-D or allegra-D which is an antihistamine and decongestant.   Please try afrin which will help with nasal congestion but use for only three days.   Please also try using a netti pot on a regular occasion.  Honey can help with a sore throat.      Please feel free to call with any questions or concerns at any time, at [phone]. --Dr. Schmitz  
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
## 2017-07-30 NOTE — Assessment & Plan Note (Addendum)
Does not appear that she ever was seen by an allergist upon looking act at the referral placed in January. A CT of her sinuses was ordered but never completed.

## 2017-07-30 NOTE — Assessment & Plan Note (Signed)
Symptoms are suggestive of viral in nature. - Prednisone for wheezing. - Printed prescription for azithromycin was provided - Counseled on supportive care.

## 2017-07-31 ENCOUNTER — Other Ambulatory Visit: Payer: Self-pay | Admitting: Internal Medicine

## 2017-07-31 ENCOUNTER — Other Ambulatory Visit: Payer: Self-pay

## 2017-07-31 ENCOUNTER — Telehealth: Payer: Self-pay | Admitting: Internal Medicine

## 2017-07-31 MED ORDER — ALBUTEROL SULFATE HFA 108 (90 BASE) MCG/ACT IN AERS: 1.0000 | INHALATION_SPRAY | Freq: Four times a day (QID) | RESPIRATORY_TRACT | 0 refills | Status: DC | PRN

## 2017-07-31 MED ORDER — PREDNISONE 5 MG PO TABS: ORAL_TABLET | ORAL | 0 refills | Status: DC

## 2017-07-31 NOTE — Telephone Encounter (Signed)
Please disregard. It has been corrected.

## 2017-07-31 NOTE — Telephone Encounter (Signed)
PredniSONE (DELTASONE) 5 MG tablet should have been sent to [facility] on [address].

## 2017-08-08 ENCOUNTER — Ambulatory Visit (INDEPENDENT_AMBULATORY_CARE_PROVIDER_SITE_OTHER): Payer: 59 | Admitting: Internal Medicine

## 2017-08-08 ENCOUNTER — Encounter: Payer: Self-pay | Admitting: Internal Medicine

## 2017-08-08 DIAGNOSIS — J069 Acute upper respiratory infection, unspecified: Secondary | ICD-10-CM | POA: Diagnosis not present

## 2017-08-08 DIAGNOSIS — T7840XD Allergy, unspecified, subsequent encounter: Secondary | ICD-10-CM

## 2017-08-08 DIAGNOSIS — J4531 Mild persistent asthma with (acute) exacerbation: Secondary | ICD-10-CM

## 2017-08-08 MED ORDER — PROMETHAZINE-CODEINE 6.25-10 MG/5ML PO SYRP: 5.0000 mL | ORAL_SOLUTION | ORAL | 0 refills | Status: DC | PRN

## 2017-08-08 MED ORDER — CEFDINIR 300 MG PO CAPS: 300.0000 mg | ORAL_CAPSULE | Freq: Two times a day (BID) | ORAL | 0 refills | Status: DC

## 2017-08-08 MED ORDER — FLUCONAZOLE 150 MG PO TABS: 150.0000 mg | ORAL_TABLET | Freq: Once | ORAL | 1 refills | Status: AC

## 2017-08-08 MED ORDER — PREDNISONE 10 MG PO TABS: ORAL_TABLET | ORAL | 0 refills | Status: DC

## 2017-08-08 NOTE — Progress Notes (Signed)
Subjective:  Patient ID: Tamara Cross, female    DOB: Feb 06, 1966  Age: 51 y.o. MRN: [id number]  CC: No chief complaint on file.   HPI BRIEL GALLICCHIO presents for URI w/sinusitis and cough >1 week - worse. Green nasal d/c. Zpac did not help F/u allergies and asthma  Outpatient Medications Prior to Visit  Medication Sig Dispense Refill  . acetaminophen (TYLENOL) 500 MG tablet Take 1,000 mg by mouth daily as needed for mild pain.    [name] albuterol (PROVENTIL HFA;VENTOLIN HFA) 108 (90 Base) MCG/ACT inhaler Inhale 1-2 puffs into the lungs every 6 (six) hours as needed for wheezing or shortness of breath. 1 Inhaler 0  . aspirin-acetaminophen-caffeine (EXCEDRIN MIGRAINE) 250-250-65 MG tablet Take 1 tablet by mouth every 6 (six) hours as needed for headache.    . budesonide-formoterol (SYMBICORT) 160-4.5 MCG/ACT inhaler Inhale 2 puffs into the lungs 2 (two) times daily. 1 Inhaler 6  . desvenlafaxine (PRISTIQ) 100 MG 24 hr tablet Take 100 mg by mouth daily.      [name] ibuprofen (ADVIL,MOTRIN) 600 MG tablet Take 1 tablet (600 mg total) by mouth every 6 (six) hours as needed. 30 tablet 0  . lamoTRIgine (LAMICTAL) 100 MG tablet Take 100-200 mg by mouth See admin instructions. Take two tablets by mouth in the morning then take one in the evening    . metoprolol succinate (TOPROL-XL) 50 MG 24 hr tablet TAKE 1 TABLET BY MOUTH DAILY, WITH OR IMMEDIATELY FOLLOWING A MEAL 15 tablet 0  . pantoprazole (PROTONIX) 40 MG tablet Take 1 tablet (40 mg total) by mouth daily. (Patient taking differently: Take 40 mg by mouth daily as needed. ) 90 tablet 3  . promethazine (PHENERGAN) 25 MG tablet Take 1 tablet (25 mg total) by mouth every 6 (six) hours as needed for nausea or vomiting. 30 tablet 0  . tamoxifen (NOLVADEX) 20 MG tablet Take 0.5 tablets (10 mg total) by mouth daily. 90 tablet 3  . traZODone (DESYREL) 100 MG tablet Take 100 mg by mouth at bedtime as needed for sleep.     . valACYclovir (VALTREX) 500 MG tablet  Take 500 mg by mouth daily as needed (for breakouts). Reported on 12/20/2015    . VYVANSE 50 MG capsule Take 50 mg by mouth daily.  0  . azithromycin (ZITHROMAX) 250 MG tablet Take 500 mg the first day then 250 mg the next 4 days. Total of 5 days. 6 tablet 0  . predniSONE (DELTASONE) 5 MG tablet Take 6 pills for first day, 5 pills second day, 4 pills third day, 3 pills fourth day, 2 pills the fifth day, and 1 pill sixth day. 21 tablet 0   Facility-Administered Medications Prior to Visit  Medication Dose Route Frequency Provider Last Rate Last Dose  . gadopentetate dimeglumine (MAGNEVIST) injection 18 mL  18 mL Intravenous Once PRN Penumalli, Vikram R, MD        ROS Review of Systems  Constitutional: Positive for fatigue. Negative for activity change, appetite change, chills and unexpected weight change.  HENT: Positive for congestion. Negative for mouth sores and sinus pressure.   Eyes: Negative for visual disturbance.  Respiratory: Positive for cough and wheezing. Negative for chest tightness.   Gastrointestinal: Negative for abdominal pain and nausea.  Genitourinary: Negative for difficulty urinating, frequency and vaginal pain.  Musculoskeletal: Negative for back pain and gait problem.  Skin: Negative for pallor and rash.  Neurological: Negative for dizziness, tremors, weakness, numbness and headaches.  Psychiatric/Behavioral: Negative  for confusion and sleep disturbance.    Objective:  BP 120/82 (BP Location: Left Arm, Patient Position: Sitting, Cuff Size: Large)   Pulse 73   Temp 98.4 F (36.9 C) (Oral)   Ht 5\' 4"  (1.626 m)   Wt 207 lb (93.9 kg)   LMP 04/11/2012   SpO2 98%   BMI 35.53 kg/m   BP Readings from Last 3 Encounters:  08/08/17 120/82  07/30/17 122/76  10/15/16 90/62    Wt Readings from Last 3 Encounters:  08/08/17 207 lb (93.9 kg)  07/30/17 207 lb (93.9 kg)  10/15/16 199 lb (90.3 kg)    Physical Exam  Constitutional: She appears well-developed. No  distress.  HENT:  Head: Normocephalic.  Right Ear: External ear normal.  Left Ear: External ear normal.  Nose: Nose normal.  Mouth/Throat: Oropharynx is clear and moist.  Eyes: Pupils are equal, round, and reactive to light. Conjunctivae are normal. Right eye exhibits no discharge. Left eye exhibits no discharge.  Neck: Normal range of motion. Neck supple. No JVD present. No tracheal deviation present. No thyromegaly present.  Cardiovascular: Normal rate, regular rhythm and normal heart sounds.   Pulmonary/Chest: No stridor. No respiratory distress. She has wheezes.  Abdominal: Soft. Bowel sounds are normal. She exhibits no distension and no mass. There is no tenderness. There is no rebound and no guarding.  Musculoskeletal: She exhibits no edema or tenderness.  Lymphadenopathy:    She has no cervical adenopathy.  Neurological: She displays normal reflexes. No cranial nerve deficit. She exhibits normal muscle tone. Coordination normal.  Skin: No rash noted. No erythema.  Psychiatric: She has a normal mood and affect. Her behavior is normal. Judgment and thought content normal.  mild B wheezing eryth nares, throat  Lab Results  Component Value Date   WBC 5.2 10/09/2016   HGB 14.2 10/09/2016   HCT 43.0 10/09/2016   PLT 231 10/09/2016   GLUCOSE 116 (H) 10/09/2016   CHOL 178 08/23/2008   TRIG 47 08/23/2008   HDL 52.7 08/23/2008   LDLCALC 116 (H) 08/23/2008   ALT 15 01/03/2015   AST 19 01/03/2015   NA 139 10/09/2016   K 4.0 10/09/2016   CL 104 10/09/2016   CREATININE 0.67 10/09/2016   BUN 10 10/09/2016   CO2 22 10/09/2016   TSH 1.050 09/08/2015    Dg Chest 2 View  Result Date: 10/09/2016 CLINICAL DATA:  One month chest congestion, productive cough and shortness of breath. History of breast cancer. EXAM: CHEST  2 VIEW COMPARISON:  Chest radiograph September 04, 2016 FINDINGS: Cardiomediastinal silhouette is normal. No pleural effusions or focal consolidations. Mild bronchitic  changes. Trachea projects midline and there is no pneumothorax. Soft tissue planes and included osseous structures are non-suspicious. Surgical clips in the included right abdomen compatible with cholecystectomy. IMPRESSION: Mild bronchitic changes without focal consolidation . Electronically Signed   By: Elon Alas M.D.   On: 10/09/2016 02:15    Assessment & Plan:   There are no diagnoses linked to this encounter. I have discontinued Ms. Hammond azithromycin and predniSONE. I am also having her maintain her desvenlafaxine, lamoTRIgine, traZODone, valACYclovir, acetaminophen, VYVANSE, pantoprazole, tamoxifen, budesonide-formoterol, aspirin-acetaminophen-caffeine, promethazine, ibuprofen, metoprolol succinate, and albuterol.  No orders of the defined types were placed in this encounter.    Follow-up: No Follow-up on file.  Walker Kehr, MD

## 2017-08-08 NOTE — Assessment & Plan Note (Signed)
If not better - take Prednisone 10 mg: take 4 tabs a day x 3 days; then 3 tabs a day x 4 days; then 2 tabs a day x 4 days, then 1 tab a day x 6 days, then stop. Take pc.

## 2017-08-08 NOTE — Patient Instructions (Signed)
You can use over-the-counter  "cold" medicines  such as "Tylenol cold" , "Advil cold",  "Mucinex" or" Mucinex D"  for cough and congestion.   Avoid decongestants if you have high blood pressure and use "Afrin" nasal spray for nasal congestion as directed. Use " Delsym" or" Robitussin" cough syrup varietis for cough.  You can use plain "Tylenol" or "Advil" for fever, chills and achyness. Use Halls or Ricola cough drops.   Please, make an appointment if you are not better or if you're worse.  

## 2017-08-08 NOTE — Assessment & Plan Note (Signed)
Not better Prom-cod syr Cefdinir po

## 2017-08-08 NOTE — Assessment & Plan Note (Signed)
Chronic rhinitis - worse in fall and winter Take Claritin/Singulair seasonally

## 2017-08-14 ENCOUNTER — Ambulatory Visit: Payer: Self-pay | Admitting: Internal Medicine

## 2017-08-21 ENCOUNTER — Other Ambulatory Visit: Payer: Self-pay | Admitting: Internal Medicine

## 2017-08-27 ENCOUNTER — Other Ambulatory Visit: Payer: Self-pay | Admitting: Internal Medicine

## 2017-10-17 ENCOUNTER — Ambulatory Visit (INDEPENDENT_AMBULATORY_CARE_PROVIDER_SITE_OTHER)
Admission: RE | Admit: 2017-10-17 | Discharge: 2017-10-17 | Disposition: A | Discharge status: Home / Self Care | Payer: 59 | Source: Ambulatory Visit | Attending: Family | Admitting: Family

## 2017-10-17 ENCOUNTER — Encounter: Payer: Self-pay | Admitting: Family

## 2017-10-17 ENCOUNTER — Ambulatory Visit: Payer: 59 | Admitting: Family

## 2017-10-17 VITALS — BP 118/78 | HR 86 | Temp 98.2°F | Ht 64.0 in | Wt 206.1 lb

## 2017-10-17 DIAGNOSIS — J329 Chronic sinusitis, unspecified: Secondary | ICD-10-CM

## 2017-10-17 DIAGNOSIS — M545 Low back pain: Secondary | ICD-10-CM | POA: Diagnosis not present

## 2017-10-17 DIAGNOSIS — G8929 Other chronic pain: Secondary | ICD-10-CM

## 2017-10-17 DIAGNOSIS — M5442 Lumbago with sciatica, left side: Secondary | ICD-10-CM | POA: Diagnosis not present

## 2017-10-17 MED ORDER — PREDNISONE 10 MG (21) PO TBPK: ORAL_TABLET | ORAL | 0 refills | Status: DC

## 2017-10-17 MED ORDER — DOXYCYCLINE HYCLATE 100 MG PO TABS: 100.0000 mg | ORAL_TABLET | Freq: Two times a day (BID) | ORAL | 0 refills | Status: DC

## 2017-10-17 NOTE — Progress Notes (Signed)
Tamara Cross is a 52 y.o. female with the following history as recorded in [employer]:  Patient Active Problem List   Diagnosis Date Noted  . Sinusitis, chronic 10/15/2016  . Asthmatic bronchitis with acute exacerbation 04/03/2016  . Acute URI 12/20/2015  . Ataxia 05/30/2015  . Liver lesion, left lobe 01/03/2015  . Gallstone pancreatitis 11/28/2014  . Dysphagia, pharyngoesophageal phase 10/06/2014  . Special screening for malignant neoplasm of colon 10/06/2014  . Dysphagia   . Special screening for malignant neoplasms, colon   . Arthralgia 09/12/2014  . Submuscular lipoma of chest 09/12/2014  . Leg pain 08/29/2014  . Hot flashes due to tamoxifen 08/29/2014  . LBP (low back pain) 04/26/2014  . Rash and nonspecific skin eruption 04/26/2014  . 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 Palpitations 11/18/2013  . Allergy   . Heart murmur   . HIP PAIN 09/07/2010  . PEDAL EDEMA 04/26/2010  . EXTERNAL HEMORRHOIDS 01/24/2010  . HIATAL HERNIA 01/24/2010  . Reflux esophagitis 12/19/2009  . GERD 12/19/2009  . Irritable bowel syndrome 12/19/2009  . 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  . Migraine 08/23/2008  . ESOPHAGEAL STRICTURE 03/11/2008  . POLYARTHRALGIA 03/11/2008  . Obesity 05/01/2007  . ANXIETY 05/01/2007  . Chronic depressive disorder 05/01/2007  . PREMATURE VENTRICULAR CONTRACTIONS 05/01/2007  . Allergic rhinitis 05/01/2007  . MITRAL VALVE PROLAPSE, HX OF 05/01/2007  . PUD, HX OF 05/01/2007    Current Outpatient Medications  Medication Sig Dispense Refill  . acetaminophen (TYLENOL) 500 MG tablet Take 1,000 mg by mouth daily as needed for mild pain.    [name] albuterol (PROVENTIL HFA;VENTOLIN HFA) 108 (90 Base) MCG/ACT inhaler Inhale 1-2 puffs into the lungs every 6 (six) hours as needed for wheezing or shortness of breath. 1 Inhaler 0  . aspirin-acetaminophen-caffeine (EXCEDRIN MIGRAINE) 250-250-65 MG tablet Take 1 tablet by mouth every 6 (six)  hours as needed for headache.    . budesonide-formoterol (SYMBICORT) 160-4.5 MCG/ACT inhaler Inhale 2 puffs into the lungs 2 (two) times daily. 1 Inhaler 6  . desvenlafaxine (PRISTIQ) 100 MG 24 hr tablet Take 100 mg by mouth daily.      [name] ibuprofen (ADVIL,MOTRIN) 600 MG tablet Take 1 tablet (600 mg total) by mouth every 6 (six) hours as needed. 30 tablet 0  . lamoTRIgine (LAMICTAL) 100 MG tablet Take 100-200 mg by mouth See admin instructions. Take two tablets by mouth in the morning then take one in the evening    . metoprolol succinate (TOPROL-XL) 50 MG 24 hr tablet Take 1 tablet (50 mg total) by mouth daily. Patient needs office visit before refills will be given 30 tablet 0  . pantoprazole (PROTONIX) 40 MG tablet Take 1 tablet (40 mg total) by mouth daily. (Patient taking differently: Take 40 mg by mouth daily as needed. ) 90 tablet 3  . tamoxifen (NOLVADEX) 20 MG tablet Take 0.5 tablets (10 mg total) by mouth daily. 90 tablet 3  . traZODone (DESYREL) 100 MG tablet Take 100 mg by mouth at bedtime as needed for sleep.     . valACYclovir (VALTREX) 500 MG tablet Take 500 mg by mouth daily as needed (for breakouts). Reported on 12/20/2015    . VYVANSE 50 MG capsule Take 50 mg by mouth daily.  0  . doxycycline (VIBRA-TABS) 100 MG tablet Take 1 tablet (100 mg total) by mouth 2 (two) times daily. 20 tablet 0  . predniSONE (STERAPRED UNI-PAK 21 TAB) 10 MG (21) TBPK tablet Taper as  directed 21 tablet 0   No current facility-administered medications for this visit.    Facility-Administered Medications Ordered in Other Visits  Medication Dose Route Frequency Provider Last Rate Last Dose  . gadopentetate dimeglumine (MAGNEVIST) injection 18 mL  18 mL Intravenous Once PRN Penumalli, Earlean Polka, MD        Allergies: Imitrex [sumatriptan]; Sulfonamide derivatives; Reglan [metoclopramide]; Zofran [ondansetron hcl]; and Ondansetron  Past Medical History:  Diagnosis Date  . Allergy    chroni rhinitis  .  Anxiety   . Arthritis   . 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 Complication of anesthesia    last 2 surgeries had some tachycardia  . Depression   . Dysrhythmia    tachy occ  . Esophageal stricture   . GERD (gastroesophageal reflux disease)   . H/O hiatal hernia   . Heart murmur    MVP  . IBS (irritable bowel syndrome)   . Migraine   . MVP (mitral valve prolapse)   . Osteoarthritis   . Peptic ulcer disease   . Pneumonia    hx  . S/P radiation therapy  06/28/2014-08/08/2014    1) Right Breast / 50 Gy in 25 fractions/ 2) Right Breast Boost / 10 Gy in 5 fractions    Past Surgical History:  Procedure Laterality Date  . APPENDECTOMY    . BREAST LUMPECTOMY Bilateral   . BREAST LUMPECTOMY WITH NEEDLE LOCALIZATION Bilateral 05/02/2014   Procedure: BILATERIAL BREAST LUMPECTOMY WITH NEEDLE LOCALIZATION;  Surgeon: Shann Medal, MD;  Location: [facility];  Service: General;  Laterality: Bilateral;  . CERVIX LESION DESTRUCTION    . CESAREAN SECTION  2002  . CHOLECYSTECTOMY  11/29/2014   lap chole   . CHOLECYSTECTOMY N/A 11/29/2014   Procedure: LAPAROSCOPIC CHOLECYSTECTOMY WITH INTRAOPERATIVE CHOLANGIOGRAM;  Surgeon: Doreen Salvage, MD;  Location: [facility];  Service: General;  Laterality: N/A;  . COLONOSCOPY N/A 10/06/2014   Procedure: COLONOSCOPY;  Surgeon: Lafayette Dragon, MD;  Location: [facility];  Service: Endoscopy;  Laterality: N/A;  . COLPOSCOPY    . CYSTECTOMY     left breast,in 20's  . DILATION AND CURETTAGE OF UTERUS     x2  . ESOPHAGOGASTRODUODENOSCOPY N/A 10/06/2014   Procedure: ESOPHAGOGASTRODUODENOSCOPY (EGD);  Surgeon: Lafayette Dragon, MD;  Location: [facility];  Service: Endoscopy;  Laterality: N/A;  . NOVASURE ABLATION    . SHOULDER SURGERY Left    debridement ,bone spurs  . TONSILLECTOMY      Family History  Problem Relation Age of Onset  . Heart disease Father   . Hypertension Father   . Colon cancer Maternal Grandmother    . Depression Brother   . Diabetes Maternal Grandfather   . Heart disease Maternal Grandfather   . Colon cancer Maternal Uncle        x 2  . Colon polyps Unknown        x 9 maternal uncles/aunts  . Breast cancer Neg Hx     Social History   Tobacco Use  . Smoking status: Former Smoker    Packs/day: 0.25    Years: 10.00    Pack years: 2.50    Types: Cigarettes    Start date: 02/23/1998  . Smokeless tobacco: Never Used  Substance Use Topics  . Alcohol use: Yes    Alcohol/week: 3.0 oz    Types: 5 Glasses of wine per week    Comment: socially    Subjective:  Patient presents with recurrent sinus infection; has been running occasional fever; notes that head and chest feel "tight and full." Prone to frequent infections; last treated in November with Omnicef; has Symbicort but does not have to use regularly; does not take allergy medication daily;  Also mentions chronic low back pain with radiating symptoms into left leg; notes that pain can be so bad she can hardly stand; planning to come see her PCP about this issue next week; had MRI lumbar spine in 2016; using Extra Strength Tyelnol; "just tired of being in pain" and unable to move easily; no loss of bowel or bladder;    Objective:  Vitals:   10/17/17 0833  BP: 118/78  Pulse: 86  Temp: 98.2 F (36.8 C)  TempSrc: Oral  SpO2: 99%  Weight: 206 lb 1.3 oz (93.5 kg)  Height: 5\' 4"  (1.626 m)    General: Well developed, well nourished, in no acute distress  Skin : Warm and dry.  Head: Normocephalic and atraumatic  Eyes: Sclera and conjunctiva clear; pupils round and reactive to light; extraocular movements intact  Ears: External normal; canals clear; tympanic membranes congested bilaterally Oropharynx: Pink, supple. No suspicious lesions  Neck: Supple without thyromegaly, adenopathy  Lungs: Respirations unlabored; clear to auscultation bilaterally without wheeze, rales, rhonchi  CVS exam: normal rate and regular rhythm.   Musculoskeletal: No deformities; no active joint inflammation  Extremities: No edema, cyanosis, clubbing  Vessels: Symmetric bilaterally  Neurologic: Alert and oriented; speech intact; face symmetrical; moves all extremities well; CNII-XII intact without focal deficit; visible limp noted- favors left leg while walking  Assessment:  1. Recurrent sinus infections   2. Chronic left-sided low back pain with left-sided sciatica     Plan:  1. Rx for Doxycycline 100 mg bid x 10 days; Rx for Prednisone 10 mg taper pack; refer to ENT due to frequent infections; 2. Update lumbar X-ray today; prednisone should help with radiating pain; keep planned appt with PCP next week; will most likely need MRI and specialist evaluation.  No Follow-up on file.  Orders Placed This Encounter  Procedures  . DG Lumbar Spine 2-3 Views    Standing Status:   Future    Number of Occurrences:   1    Standing Expiration Date:   12/16/2018    Order Specific Question:   Reason for Exam (SYMPTOM  OR DIAGNOSIS REQUIRED)    Answer:   low back pain with radiculopathy    Order Specific Question:   Is patient pregnant?    Answer:   No    Order Specific Question:   Preferred imaging location?    Answer:   [facility]    Order Specific Question:   Radiology Contrast Protocol - do NOT remove file path    Answer:   [email or web address]  . Ambulatory referral to ENT    Referral Priority:   Routine    Referral Type:   Consultation    Referral Reason:   Specialty Services Required    Requested Specialty:   Otolaryngology    Number of Visits Requested:   1    Requested Prescriptions   Signed Prescriptions Disp Refills  . predniSONE (STERAPRED UNI-PAK 21 TAB) 10 MG (21) TBPK tablet 21 tablet 0    Sig: Taper as directed  . doxycycline (VIBRA-TABS) 100 MG tablet 20 tablet 0    Sig: Take 1 tablet (100 mg total) by mouth 2 (two) times daily.

## 2017-10-21 ENCOUNTER — Other Ambulatory Visit (INDEPENDENT_AMBULATORY_CARE_PROVIDER_SITE_OTHER): Payer: 59

## 2017-10-21 ENCOUNTER — Ambulatory Visit: Payer: 59 | Admitting: Internal Medicine

## 2017-10-21 ENCOUNTER — Encounter: Payer: Self-pay | Admitting: Internal Medicine

## 2017-10-21 ENCOUNTER — Other Ambulatory Visit: Payer: Self-pay | Admitting: Internal Medicine

## 2017-10-21 VITALS — BP 126/82 | HR 91 | Temp 98.3°F | Ht 64.0 in | Wt 208.0 lb

## 2017-10-21 DIAGNOSIS — M5430 Sciatica, unspecified side: Secondary | ICD-10-CM | POA: Insufficient documentation

## 2017-10-21 DIAGNOSIS — R202 Paresthesia of skin: Secondary | ICD-10-CM

## 2017-10-21 DIAGNOSIS — J320 Chronic maxillary sinusitis: Secondary | ICD-10-CM

## 2017-10-21 LAB — BASIC METABOLIC PANEL
BUN: 16 mg/dL (ref 6–23)
CHLORIDE: 101 meq/L (ref 96–112)
CO2: 31 mEq/L (ref 19–32)
CREATININE: 0.68 mg/dL (ref 0.40–1.20)
Calcium: 9.1 mg/dL (ref 8.4–10.5)
GFR: 95.97 mL/min (ref >60.00)
GLUCOSE: 110 mg/dL — AB (ref 70–99)
POTASSIUM: 4.2 meq/L (ref 3.5–5.1)
Sodium: 141 mEq/L (ref 135–145)

## 2017-10-21 LAB — HEPATIC FUNCTION PANEL
ALK PHOS: 77 U/L (ref 39–117)
ALT: 20 U/L (ref 0–35)
AST: 14 U/L (ref 0–37)
Albumin: 4.2 g/dL (ref 3.5–5.2)
BILIRUBIN DIRECT: 0.1 mg/dL (ref 0.0–0.3)
BILIRUBIN TOTAL: 0.5 mg/dL (ref 0.2–1.2)
TOTAL PROTEIN: 6.7 g/dL (ref 6.0–8.3)

## 2017-10-21 LAB — VITAMIN B12: Vitamin B-12: 522 pg/mL (ref 211–911)

## 2017-10-21 LAB — VITAMIN D 25 HYDROXY (VIT D DEFICIENCY, FRACTURES): VITD: 23.55 ng/mL — AB (ref 30.00–100.00)

## 2017-10-21 LAB — TSH: TSH: 5.12 u[IU]/mL — AB (ref 0.35–4.50)

## 2017-10-21 MED ORDER — MONTELUKAST SODIUM 10 MG PO TABS: 10.0000 mg | ORAL_TABLET | Freq: Every day | ORAL | 3 refills | Status: DC

## 2017-10-21 MED ORDER — GABAPENTIN 100 MG PO CAPS: 100.0000 mg | ORAL_CAPSULE | Freq: Two times a day (BID) | ORAL | 3 refills | Status: DC

## 2017-10-21 MED ORDER — VITAMIN D3 1.25 MG (50000 UT) PO CAPS: 1.0000 | ORAL_CAPSULE | ORAL | 0 refills | Status: DC

## 2017-10-21 MED ORDER — PREDNISONE 10 MG PO TABS: ORAL_TABLET | ORAL | 1 refills | Status: DC

## 2017-10-21 NOTE — Assessment & Plan Note (Signed)
?  piriformis syndrome Start  ROM exercising MRI if not better Prednisone 10 mg: take 4 tabs a day x 3 days; then 3 tabs a day x 4 days; then 2 tabs a day x 4 days, then 1 tab a day x 6 days, then stop. Take pc.

## 2017-10-21 NOTE — Patient Instructions (Addendum)
Hip opener exercises - youtube.com   Piriformis Syndrome Rehab Ask your health care provider which exercises are safe for you. Do exercises exactly as told by your health care provider and adjust them as directed. It is normal to feel mild stretching, pulling, tightness, or discomfort as you do these exercises, but you should stop right away if you feel sudden pain or your pain gets worse.Do not begin these exercises until told by your health care provider. Stretching and range of motion exercises These exercises warm up your muscles and joints and improve the movement and flexibility of your hip and pelvis. These exercises also help to relieve pain, numbness, and tingling. Exercise A: Hip rotators  1. Lie on your back on a firm surface. 2. Pull your left / right knee toward your same shoulder with your left / right hand until your knee is pointing toward the ceiling. Hold your left / right ankle with your other hand. 3. Keeping your knee steady, gently pull your left / right ankle toward your other shoulder until you feel a stretch in your buttocks. 4. Hold this position for __________ seconds. Repeat __________ times. Complete this stretch __________ times a day. Exercise B: Hip extensors 1. Lie on your back on a firm surface. Both of your legs should be straight. 2. Pull your left / right knee to your chest. Hold your leg in this position by holding onto the back of your thigh or the front of your knee. 3. Hold this position for __________ seconds. 4. Slowly return to the starting position. Repeat __________ times. Complete this stretch __________ times a day. Strengthening exercises These exercises build strength and endurance in your hip and thigh muscles. Endurance is the ability to use your muscles for a long time, even after they get tired. Exercise C: Straight leg raises ( hip abductors) 1. Lie on your side with your left / right leg in the top position. Lie so your head, shoulder,  knee, and hip line up. Bend your bottom knee to help you balance. 2. Lift your top leg up 4-6 inches (10-15 cm), keeping your toes pointed straight ahead. 3. Hold this position for __________ seconds. 4. Slowly lower your leg to the starting position. Let your muscles relax completely. Repeat __________ times. Complete this exercise__________ times a day. Exercise D: Hip abductors and rotators, quadruped  1. Get on your hands and knees on a firm, lightly padded surface. Your hands should be directly below your shoulders, and your knees should be directly below your hips. 2. Lift your left / right knee out to the side. Keep your knee bent. Do not twist your body. 3. Hold this position for __________ seconds. 4. Slowly lower your leg. Repeat __________ times. Complete this exercise__________ times a day. Exercise E: Straight leg raises ( hip extensors) 1. Lie on your abdomen on a bed or a firm surface with a pillow under your hips. 2. Squeeze your buttock muscles and lift your left / right thigh off the bed. Do not let your back arch. 3. Hold this position for __________ seconds. 4. Slowly return to the starting position. Let your muscles relax completely before doing another repetition. Repeat __________ times. Complete this exercise__________ times a day. This information is not intended to replace advice given to you by your health care provider. Make sure you discuss any questions you have with your health care provider. Document Released: 09/23/2005 Document Revised: 05/28/2016 Document Reviewed: 09/05/2015 Elsevier Interactive Patient Education  2018 [employer] Piriformis Syndrome Piriformis syndrome is a condition that can cause pain and numbness in your buttocks and down the back of your leg. Piriformis syndrome happens when the small muscle that connects the base of your spine to your hip (piriformis muscle) presses on the nerve that runs down the back of your leg (sciatic  nerve). The piriformis muscle helps your hip rotate and helps to bring your leg back and out. It also helps shift your weight while you are walking to keep you stable. The sciatic nerve runs under or through the piriformis. Damage to the piriformis muscle can cause spasms that put pressure on the nerve below. This causes pain and discomfort while sitting and moving. The pain may feel as if it begins in the buttock and spreads (radiates) down your hip and thigh. What are the causes? This condition is caused by pressure on the sciatic nerve from the piriformis muscle. The piriformis muscle can get irritated with overuse, especially if other hip muscles are weak and the piriformis has to do extra work. Piriformis syndrome can also occur after an injury, like a fall onto your buttocks. What increases the risk? This condition is more likely to develop in:  Women.  People who sit for long periods of time.  Cyclists.  People who have weak buttocks muscles (gluteal muscles).  What are the signs or symptoms? Pain, tingling, or numbness that starts in the buttock and runs down the back of your leg (sciatica) is the most common symptom of this condition. Your symptoms may:  Get worse the longer you sit.  Get worse when you walk, run, or go up on stairs.  How is this diagnosed? This condition is diagnosed based on your symptoms, medical history, and physical exam. During this exam, your health care provider may move your leg into different positions to check for pain. He or she will also press on the muscles of your hip and buttock to see if that increases your symptoms. You may also have an X-ray or MRI. How is this treated? Treatment for this condition may include:  Stopping all activities that cause pain or make your condition worse.  Using heat or ice to relieve pain as told by your health care provider.  Taking medicines to reduce pain and swelling.  Taking a muscle relaxer to release the  piriformis muscle.  Doing range-of-motion and strengthening exercises (physical therapy) as told by your health care provider.  Massaging the affected area.  Getting an injection of an anti-inflammatory medicine or muscle relaxer to reduce inflammation and muscle tension.  In rare cases, you may need surgery to cut the muscle and release pressure on the nerve if other treatments do not work. Follow these instructions at home:  Take over-the-counter and prescription medicines only as told by your health care provider.  Do not sit for long periods. Get up and walk around every 20 minutes or as often as told by your health care provider.  If directed, apply heat to the affected area as often as told by your health care provider. Use the heat source that your health care provider recommends, such as a moist heat pack or a heating pad. ? Place a towel between your skin and the heat source. ? Leave the heat on for 20-30 minutes. ? Remove the heat if your skin turns bright red. This is especially important if you are unable to feel pain, heat, or cold. You may have a greater risk of getting burned.  If directed, apply ice to the injured area. ? Put ice in a plastic bag. ? Place a towel between your skin and the bag. ? Leave the ice on for 20 minutes, 2-3 times a day.  Do exercises as told by your health care provider.  Return to your normal activities as told by your health care provider. Ask your health care provider what activities are safe for you.  Keep all follow-up visits as told by your health care provider. This is important. How is this prevented?  Do not sit for longer than 20 minutes at a time. When you sit, choose padded surfaces.  Warm up and stretch before being active.  Cool down and stretch after being active.  Give your body time to rest between periods of activity.  Make sure to use equipment that fits you.  Maintain physical fitness,  including: ? Strength. ? Flexibility. Contact a health care provider if:  Your pain and stiffness continue or get worse.  Your leg or hip becomes weak.  You have changes in your bowel function or bladder function. This information is not intended to replace advice given to you by your health care provider. Make sure you discuss any questions you have with your health care provider. Document Released: 09/23/2005 Document Revised: 05/28/2016 Document Reviewed: 09/05/2015 Elsevier Interactive Patient Education  2018 [place].  Trochanteric Bursitis Trochanteric bursitis is a condition that causes hip pain. Trochanteric bursitis happens when fluid-filled sacs (bursae) in the hip get irritated. Normally these sacs absorb shock and help strong bands of tissue (tendons) in your hip glide smoothly over each other and over your hip bones. What are the causes? This condition results from increased friction between the hip bones and the tendons that go over them. This condition can happen if you:  Have weak hips.  Use your hip muscles too much (overuse).  Get hit in the hip.  What increases the risk? This condition is more likely to develop in:  Women.  Adults who are middle-aged or older.  People with arthritis or a spinal condition.  People with weak buttocks muscles (gluteal muscles).  People who have one leg that is shorter than the other.  People who participate in certain kinds of athletic activities, such as: ? Running sports, especially long-distance running. ? Contact sports, like football or martial arts. ? Sports in which falls may occur, like skiing.  What are the signs or symptoms? The main symptom of this condition is pain and tenderness over the point of your hip. The pain may be:  Sharp and intense.  Dull and achy.  Felt on the outside of your thigh.  It may increase when you:  Lie on your side.  Walk or run.  Go up on stairs.  Sit.  Stand up  after sitting.  Stand for long periods of time.  How is this diagnosed? This condition may be diagnosed based on:  Your symptoms.  Your medical history.  A physical exam.  Imaging tests, such as: ? X-rays to check your bones. ? An MRI or ultrasound to check your tendons and muscles.  During your physical exam, your health care provider will check the movement and strength of your hip. He or she may press on the point of your hip to check for pain. How is this treated? This condition may be treated by:  Resting.  Reducing your activity.  Avoiding activities that cause pain.  Using crutches, a cane, or a walker to decrease  the strain on your hip.  Taking medicine to help with swelling.  Having medicine injected into the bursae to help with swelling.  Using ice, heat, and massage therapy for pain relief.  Physical therapy exercises for strength and flexibility.  Surgery (rare).  Follow these instructions at home: Activity  Rest.  Avoid activities that cause pain.  Return to your normal activities as told by your health care provider. Ask your health care provider what activities are safe for you. Managing pain, stiffness, and swelling  Take over-the-counter and prescription medicines only as told by your health care provider.  If directed, apply heat to the injured area as told by your health care provider. ? Place a towel between your skin and the heat source. ? Leave the heat on for 20-30 minutes. ? Remove the heat if your skin turns bright red. This is especially important if you are unable to feel pain, heat, or cold. You may have a greater risk of getting burned.  If directed, apply ice to the injured area: ? Put ice in a plastic bag. ? Place a towel between your skin and the bag. ? Leave the ice on for 20 minutes, 2-3 times a day. General instructions  If the affected leg is one that you use for driving, ask your health care provider when it is safe to  drive.  Use crutches, a cane, or a walker as told by your health care provider.  If one of your legs is shorter than the other, get fitted for a shoe insert.  Lose weight if you are overweight. How is this prevented?  Wear supportive footwear that is appropriate for your sport.  If you have hip pain, start any new exercise or sport slowly.  Maintain physical fitness, including: ? Strength. ? Flexibility. Contact a health care provider if:  Your pain does not improve with 2-4 weeks. Get help right away if:  You develop severe pain.  You have a fever.  You develop increased redness over your hip.  You have a change in your bowel function or bladder function.  You cannot control the muscles in your feet. This information is not intended to replace advice given to you by your health care provider. Make sure you discuss any questions you have with your health care provider. Document Released: 10/31/2004 Document Revised: 05/29/2016 Document Reviewed: 09/08/2015 Elsevier Interactive Patient Education  [employer].

## 2017-10-21 NOTE — Assessment & Plan Note (Signed)
CT if not better Prednisone 10 mg: take 4 tabs a day x 3 days; then 3 tabs a day x 4 days; then 2 tabs a day x 4 days, then 1 tab a day x 6 days, then stop. Take pc.

## 2017-10-21 NOTE — Progress Notes (Signed)
Subjective:  Patient ID: Tamara Cross, female    DOB: 03-May-1966  Age: 52 y.o. MRN: [id number]  CC: No chief complaint on file.   HPI Tamara Cross presents for pain in the L buttock irrad to the lower leg w/standing, tingling and numbness down to the foot x 4-5 weeks. No pain w/supine position, better w/sitting. Pain is very bad at times. C/o sinusitis and asthma sx's  Outpatient Medications Prior to Visit  Medication Sig Dispense Refill  . acetaminophen (TYLENOL) 500 MG tablet Take 1,000 mg by mouth daily as needed for mild pain.    [name] albuterol (PROVENTIL HFA;VENTOLIN HFA) 108 (90 Base) MCG/ACT inhaler Inhale 1-2 puffs into the lungs every 6 (six) hours as needed for wheezing or shortness of breath. 1 Inhaler 0  . aspirin-acetaminophen-caffeine (EXCEDRIN MIGRAINE) 250-250-65 MG tablet Take 1 tablet by mouth every 6 (six) hours as needed for headache.    . budesonide-formoterol (SYMBICORT) 160-4.5 MCG/ACT inhaler Inhale 2 puffs into the lungs 2 (two) times daily. 1 Inhaler 6  . desvenlafaxine (PRISTIQ) 100 MG 24 hr tablet Take 100 mg by mouth daily.      [name] doxycycline (VIBRA-TABS) 100 MG tablet Take 1 tablet (100 mg total) by mouth 2 (two) times daily. 20 tablet 0  . ibuprofen (ADVIL,MOTRIN) 600 MG tablet Take 1 tablet (600 mg total) by mouth every 6 (six) hours as needed. 30 tablet 0  . lamoTRIgine (LAMICTAL) 100 MG tablet Take 100-200 mg by mouth See admin instructions. Take two tablets by mouth in the morning then take one in the evening    . metoprolol succinate (TOPROL-XL) 50 MG 24 hr tablet Take 1 tablet (50 mg total) by mouth daily. Patient needs office visit before refills will be given 30 tablet 0  . pantoprazole (PROTONIX) 40 MG tablet Take 1 tablet (40 mg total) by mouth daily. (Patient taking differently: Take 40 mg by mouth daily as needed. ) 90 tablet 3  . predniSONE (STERAPRED UNI-PAK 21 TAB) 10 MG (21) TBPK tablet Taper as directed 21 tablet 0  . tamoxifen (NOLVADEX) 20  MG tablet Take 0.5 tablets (10 mg total) by mouth daily. 90 tablet 3  . traZODone (DESYREL) 100 MG tablet Take 100 mg by mouth at bedtime as needed for sleep.     . valACYclovir (VALTREX) 500 MG tablet Take 500 mg by mouth daily as needed (for breakouts). Reported on 12/20/2015    . VYVANSE 50 MG capsule Take 50 mg by mouth daily.  0   Facility-Administered Medications Prior to Visit  Medication Dose Route Frequency Provider Last Rate Last Dose  . gadopentetate dimeglumine (MAGNEVIST) injection 18 mL  18 mL Intravenous Once PRN Penumalli, Vikram R, MD        ROS Review of Systems  Constitutional: Positive for fatigue. Negative for activity change, appetite change, chills and unexpected weight change.  HENT: Positive for congestion, postnasal drip, rhinorrhea and sinus pressure. Negative for mouth sores.   Eyes: Negative for visual disturbance.  Respiratory: Positive for chest tightness, shortness of breath and wheezing. Negative for cough.   Gastrointestinal: Negative for abdominal pain and nausea.  Genitourinary: Negative for difficulty urinating, frequency and vaginal pain.  Musculoskeletal: Positive for back pain and gait problem.  Skin: Negative for pallor and rash.  Neurological: Negative for dizziness, tremors, weakness, numbness and headaches.  Psychiatric/Behavioral: Negative for confusion and sleep disturbance.    Objective:  BP 126/82 (BP Location: Left Arm, Patient Position: Sitting, Cuff Size: Large)  Pulse 91   Temp 98.3 F (36.8 C) (Oral)   Ht 5\' 4"  (1.626 m)   Wt 208 lb (94.3 kg)   LMP 04/11/2012   SpO2 99%   BMI 35.70 kg/m   BP Readings from Last 3 Encounters:  10/21/17 126/82  10/17/17 118/78  08/08/17 120/82    Wt Readings from Last 3 Encounters:  10/21/17 208 lb (94.3 kg)  10/17/17 206 lb 1.3 oz (93.5 kg)  08/08/17 207 lb (93.9 kg)    Physical Exam  Constitutional: She appears well-developed. No distress.  HENT:  Head: Normocephalic.  Right Ear:  External ear normal.  Left Ear: External ear normal.  Nose: Nose normal.  Mouth/Throat: Oropharynx is clear and moist.  Eyes: Conjunctivae are normal. Pupils are equal, round, and reactive to light. Right eye exhibits no discharge. Left eye exhibits no discharge.  Neck: Normal range of motion. Neck supple. No JVD present. No tracheal deviation present. No thyromegaly present.  Cardiovascular: Normal rate, regular rhythm and normal heart sounds.  Pulmonary/Chest: No stridor. No respiratory distress. She has no wheezes.  Abdominal: Soft. Bowel sounds are normal. She exhibits no distension and no mass. There is no tenderness. There is no rebound and no guarding.  Musculoskeletal: She exhibits tenderness. She exhibits no edema.  Lymphadenopathy:    She has no cervical adenopathy.  Neurological: She displays normal reflexes. No cranial nerve deficit. She exhibits normal muscle tone. Coordination normal.  Skin: No rash noted. No erythema.  Psychiatric: She has a normal mood and affect. Her behavior is normal. Judgment and thought content normal.  eryth nasal mucosa str leg elev (+) L L buttock - very tender L lat hip - very tender  Lab Results  Component Value Date   WBC 5.2 10/09/2016   HGB 14.2 10/09/2016   HCT 43.0 10/09/2016   PLT 231 10/09/2016   GLUCOSE 116 (H) 10/09/2016   CHOL 178 08/23/2008   TRIG 47 08/23/2008   HDL 52.7 08/23/2008   LDLCALC 116 (H) 08/23/2008   ALT 15 01/03/2015   AST 19 01/03/2015   NA 139 10/09/2016   K 4.0 10/09/2016   CL 104 10/09/2016   CREATININE 0.67 10/09/2016   BUN 10 10/09/2016   CO2 22 10/09/2016   TSH 1.050 09/08/2015    Dg Lumbar Spine 2-3 Views  Result Date: 10/17/2017 CLINICAL DATA:  Low back pain and left leg pain for 1 month, no known injury, initial encounter EXAM: LUMBAR SPINE - 3 VIEW COMPARISON:  None. FINDINGS: Five lumbar type vertebral bodies are well visualized. Mild osteophytic changes are noted. Mild disc space narrowing is  noted at L4-5. No anterolisthesis is seen. No soft tissue abnormality is noted. IMPRESSION: Mild degenerative change without acute abnormality. Electronically Signed   By: Inez Catalina M.D.   On: 10/17/2017 10:41    Assessment & Plan:   There are no diagnoses linked to this encounter. I am having Tamara Cross maintain her desvenlafaxine, lamoTRIgine, traZODone, valACYclovir, acetaminophen, VYVANSE, pantoprazole, tamoxifen, budesonide-formoterol, aspirin-acetaminophen-caffeine, ibuprofen, albuterol, metoprolol succinate, predniSONE, and doxycycline.  No orders of the defined types were placed in this encounter.    Follow-up: No Follow-up on file.  Walker Kehr, MD

## 2017-10-27 ENCOUNTER — Other Ambulatory Visit: Payer: Self-pay | Admitting: Family

## 2017-10-27 ENCOUNTER — Telehealth: Payer: Self-pay | Admitting: Internal Medicine

## 2017-10-27 MED ORDER — DOXYCYCLINE HYCLATE 100 MG PO TABS: 100.0000 mg | ORAL_TABLET | Freq: Two times a day (BID) | ORAL | 0 refills | Status: DC

## 2017-10-27 NOTE — Telephone Encounter (Signed)
Please advise about refill

## 2017-10-27 NOTE — Telephone Encounter (Signed)
Done. Thx.

## 2017-10-27 NOTE — Telephone Encounter (Signed)
Copied from [place] [id number]. Topic: General - Other >> Oct 27, 2017 11:44 AM Darl Householder, RMA wrote: Reason for CRM: Medication refill request for doxycycline 100 mg to be sent to [facility]

## 2017-10-29 ENCOUNTER — Other Ambulatory Visit: Payer: Self-pay | Admitting: Family

## 2017-10-30 ENCOUNTER — Inpatient Hospital Stay: Admission: RE | Admit: 2017-10-30 | Payer: Self-pay | Source: Ambulatory Visit

## 2017-11-06 ENCOUNTER — Telehealth: Payer: Self-pay | Admitting: Internal Medicine

## 2017-11-06 DIAGNOSIS — M5430 Sciatica, unspecified side: Secondary | ICD-10-CM

## 2017-11-06 NOTE — Telephone Encounter (Signed)
Copied from [place] [id number]. Topic: Quick Communication - See Telephone Encounter >> Nov 06, 2017 11:55 AM Ether Griffins B wrote: CRM for notification. See Telephone encounter for:  Pt calling in about sciatic leg pain. She was given gabapentin and that is not helping at all. She states she was standing last night for about 10-15 minutes and her leg about buckled. Her pain is over a 10. She has finished the prednisone. Pt states her leg is staying numb from her ankle to her upper calf. She feels like her leg is constantly asleep.  11/06/17.

## 2017-11-06 NOTE — Telephone Encounter (Signed)
Please advise 

## 2017-11-07 NOTE — Telephone Encounter (Signed)
Pt would like like to know what to do for the pain in the mean time.

## 2017-11-07 NOTE — Telephone Encounter (Signed)
I will order an MRI of the lower spine.  Thank you

## 2017-11-09 MED ORDER — HYDROCODONE-ACETAMINOPHEN 5-325 MG PO TABS: 1.0000 | ORAL_TABLET | Freq: Four times a day (QID) | ORAL | 0 refills | Status: DC | PRN

## 2017-11-09 NOTE — Telephone Encounter (Signed)
Norco prescription was emailed to [facility].  Thank you

## 2017-11-10 ENCOUNTER — Ambulatory Visit (INDEPENDENT_AMBULATORY_CARE_PROVIDER_SITE_OTHER)
Admission: RE | Admit: 2017-11-10 | Discharge: 2017-11-10 | Disposition: A | Discharge status: Home / Self Care | Payer: 59 | Source: Ambulatory Visit | Attending: Internal Medicine | Admitting: Internal Medicine

## 2017-11-10 DIAGNOSIS — J320 Chronic maxillary sinusitis: Secondary | ICD-10-CM

## 2017-11-10 NOTE — Telephone Encounter (Signed)
Notified pt w/MD response.../lmb 

## 2017-11-11 ENCOUNTER — Ambulatory Visit
Admission: RE | Admit: 2017-11-11 | Discharge: 2017-11-11 | Disposition: A | Discharge status: Home / Self Care | Payer: 59 | Source: Ambulatory Visit | Attending: Internal Medicine | Admitting: Internal Medicine

## 2017-11-11 ENCOUNTER — Telehealth: Payer: Self-pay | Admitting: Internal Medicine

## 2017-11-11 DIAGNOSIS — M79605 Pain in left leg: Secondary | ICD-10-CM

## 2017-11-11 DIAGNOSIS — M5442 Lumbago with sciatica, left side: Secondary | ICD-10-CM

## 2017-11-11 DIAGNOSIS — M48061 Spinal stenosis, lumbar region without neurogenic claudication: Secondary | ICD-10-CM | POA: Diagnosis not present

## 2017-11-11 DIAGNOSIS — M5430 Sciatica, unspecified side: Secondary | ICD-10-CM

## 2017-11-11 MED ORDER — PREDNISONE 10 MG PO TABS: ORAL_TABLET | ORAL | 1 refills | Status: DC

## 2017-11-11 NOTE — Telephone Encounter (Signed)
Routed message to Dr. Alain Marion to address.

## 2017-11-11 NOTE — Telephone Encounter (Signed)
MRI is abnormal w/a pinched nerve on the left. NS referral was made to be seen ASAP w/[facility] renew prednisone - see RX Go to ER if pain is too severe  Thx

## 2017-11-11 NOTE — Addendum Note (Signed)
Addended by: Cassandria Anger on: 11/11/2017 01:28 PM   Modules accepted: Orders

## 2017-11-11 NOTE — Telephone Encounter (Signed)
Spoke with patient today and info given. She has been instructed that she will be contacted regarding the referral to [facility] as well as level where nerve was being compressed. She has been informed about her refill and to go to the ER if her symptoms are to severe.

## 2017-11-11 NOTE — Telephone Encounter (Signed)
Copied from [place] [id number]. Topic: Inquiry >> Nov 11, 2017 11:28 AM Tamara Cross, NT wrote: Patient is calling and is requesting her MRI results. States she is still in pain and the hydrocodone is not helping. She said prednisone helps and would like to know if more of that can be called in as well. Also would like Dr. Alain Marion to know that she can not do physical therapy right now due to the pain.   [facility].

## 2017-11-12 ENCOUNTER — Other Ambulatory Visit: Payer: Self-pay | Admitting: Neurosurgery

## 2017-11-12 DIAGNOSIS — M5126 Other intervertebral disc displacement, lumbar region: Secondary | ICD-10-CM | POA: Diagnosis not present

## 2017-11-12 DIAGNOSIS — I10 Essential (primary) hypertension: Secondary | ICD-10-CM | POA: Diagnosis not present

## 2017-11-13 ENCOUNTER — Encounter (HOSPITAL_COMMUNITY): Payer: Self-pay | Admitting: *Deleted

## 2017-11-13 ENCOUNTER — Other Ambulatory Visit: Payer: Self-pay

## 2017-11-13 NOTE — Progress Notes (Signed)
Patient verbalized understanding of pre-op instructions  7 days prior to surgery STOP taking any Aspirin(unless otherwise instructed by your surgeon), Aleve, Naproxen, Ibuprofen, Motrin, Advil, Goody's, BC's, all herbal medications, fish oil, and all vitamins   Sending to anestheisa for review of tachycardia with anesthesia

## 2017-11-14 ENCOUNTER — Ambulatory Visit (HOSPITAL_COMMUNITY): Payer: 59

## 2017-11-14 ENCOUNTER — Ambulatory Visit (HOSPITAL_COMMUNITY): Payer: 59 | Admitting: Anesthesiology

## 2017-11-14 ENCOUNTER — Ambulatory Visit (HOSPITAL_COMMUNITY)
Admission: RE | Admit: 2017-11-14 | Discharge: 2017-11-15 | Disposition: A | Discharge status: Home / Self Care | Payer: 59 | Source: Ambulatory Visit | Attending: Neurosurgery | Admitting: Neurosurgery

## 2017-11-14 ENCOUNTER — Encounter (HOSPITAL_COMMUNITY): Payer: Self-pay | Admitting: *Deleted

## 2017-11-14 ENCOUNTER — Encounter (HOSPITAL_COMMUNITY)
Admission: RE | Disposition: A | Discharge status: Home / Self Care | Payer: Self-pay | Source: Ambulatory Visit | Attending: Neurosurgery

## 2017-11-14 DIAGNOSIS — K589 Irritable bowel syndrome without diarrhea: Secondary | ICD-10-CM | POA: Insufficient documentation

## 2017-11-14 DIAGNOSIS — K219 Gastro-esophageal reflux disease without esophagitis: Secondary | ICD-10-CM | POA: Insufficient documentation

## 2017-11-14 DIAGNOSIS — Z85828 Personal history of other malignant neoplasm of skin: Secondary | ICD-10-CM | POA: Insufficient documentation

## 2017-11-14 DIAGNOSIS — I739 Peripheral vascular disease, unspecified: Secondary | ICD-10-CM | POA: Insufficient documentation

## 2017-11-14 DIAGNOSIS — M1990 Unspecified osteoarthritis, unspecified site: Secondary | ICD-10-CM | POA: Insufficient documentation

## 2017-11-14 DIAGNOSIS — Z8711 Personal history of peptic ulcer disease: Secondary | ICD-10-CM | POA: Diagnosis not present

## 2017-11-14 DIAGNOSIS — F329 Major depressive disorder, single episode, unspecified: Secondary | ICD-10-CM | POA: Diagnosis not present

## 2017-11-14 DIAGNOSIS — F419 Anxiety disorder, unspecified: Secondary | ICD-10-CM | POA: Insufficient documentation

## 2017-11-14 DIAGNOSIS — Z882 Allergy status to sulfonamides status: Secondary | ICD-10-CM | POA: Diagnosis not present

## 2017-11-14 DIAGNOSIS — F17210 Nicotine dependence, cigarettes, uncomplicated: Secondary | ICD-10-CM | POA: Diagnosis not present

## 2017-11-14 DIAGNOSIS — Z888 Allergy status to other drugs, medicaments and biological substances status: Secondary | ICD-10-CM | POA: Insufficient documentation

## 2017-11-14 DIAGNOSIS — I341 Nonrheumatic mitral (valve) prolapse: Secondary | ICD-10-CM | POA: Diagnosis not present

## 2017-11-14 DIAGNOSIS — R011 Cardiac murmur, unspecified: Secondary | ICD-10-CM | POA: Diagnosis not present

## 2017-11-14 DIAGNOSIS — Z79899 Other long term (current) drug therapy: Secondary | ICD-10-CM | POA: Diagnosis not present

## 2017-11-14 DIAGNOSIS — Z853 Personal history of malignant neoplasm of breast: Secondary | ICD-10-CM | POA: Diagnosis not present

## 2017-11-14 DIAGNOSIS — M5126 Other intervertebral disc displacement, lumbar region: Secondary | ICD-10-CM | POA: Diagnosis present

## 2017-11-14 DIAGNOSIS — J45909 Unspecified asthma, uncomplicated: Secondary | ICD-10-CM | POA: Diagnosis not present

## 2017-11-14 DIAGNOSIS — M5127 Other intervertebral disc displacement, lumbosacral region: Secondary | ICD-10-CM | POA: Diagnosis not present

## 2017-11-14 DIAGNOSIS — Z419 Encounter for procedure for purposes other than remedying health state, unspecified: Secondary | ICD-10-CM

## 2017-11-14 DIAGNOSIS — Z923 Personal history of irradiation: Secondary | ICD-10-CM | POA: Insufficient documentation

## 2017-11-14 DIAGNOSIS — Z981 Arthrodesis status: Secondary | ICD-10-CM | POA: Diagnosis not present

## 2017-11-14 HISTORY — DX: Unspecified malignant neoplasm of skin, unspecified: C44.90

## 2017-11-14 HISTORY — DX: Nausea with vomiting, unspecified: R11.2

## 2017-11-14 HISTORY — DX: Other specified postprocedural states: Z98.890

## 2017-11-14 HISTORY — PX: LUMBAR LAMINECTOMY/DECOMPRESSION MICRODISCECTOMY: SHX5026

## 2017-11-14 SURGERY — LUMBAR LAMINECTOMY/DECOMPRESSION MICRODISCECTOMY 1 LEVEL
Anesthesia: General | Site: Back | Laterality: Left

## 2017-11-14 MED ORDER — THROMBIN 5000 UNITS EX SOLR
CUTANEOUS | Status: AC
  Filled 2017-11-14: qty 10000

## 2017-11-14 MED ORDER — POTASSIUM CHLORIDE IN NACL 20-0.9 MEQ/L-% IV SOLN: INTRAVENOUS | Status: DC

## 2017-11-14 MED ORDER — MIDAZOLAM HCL 2 MG/2ML IJ SOLN
0.5000 mg | Freq: Once | INTRAMUSCULAR | Status: AC
  Administered 2017-11-14: 0.5 mg via INTRAVENOUS

## 2017-11-14 MED ORDER — SUGAMMADEX SODIUM 200 MG/2ML IV SOLN
INTRAVENOUS | Status: DC | PRN
  Administered 2017-11-14: 200 mg via INTRAVENOUS

## 2017-11-14 MED ORDER — ROCURONIUM BROMIDE 10 MG/ML (PF) SYRINGE
PREFILLED_SYRINGE | INTRAVENOUS | Status: DC | PRN
  Administered 2017-11-14: 50 mg via INTRAVENOUS

## 2017-11-14 MED ORDER — LIDOCAINE-EPINEPHRINE 0.5 %-1:200000 IJ SOLN
INTRAMUSCULAR | Status: AC
  Filled 2017-11-14: qty 1

## 2017-11-14 MED ORDER — PROPOFOL 10 MG/ML IV BOLUS
INTRAVENOUS | Status: AC
  Filled 2017-11-14: qty 20

## 2017-11-14 MED ORDER — ONDANSETRON HCL 4 MG/2ML IJ SOLN
INTRAMUSCULAR | Status: DC | PRN
  Administered 2017-11-14: 4 mg via INTRAVENOUS

## 2017-11-14 MED ORDER — ONDANSETRON HCL 4 MG/2ML IJ SOLN
INTRAMUSCULAR | Status: AC
  Filled 2017-11-14: qty 2

## 2017-11-14 MED ORDER — CEFAZOLIN SODIUM-DEXTROSE 2-3 GM-%(50ML) IV SOLR
INTRAVENOUS | Status: DC | PRN
  Administered 2017-11-14: 2 g via INTRAVENOUS

## 2017-11-14 MED ORDER — HEMOSTATIC AGENTS (NO CHARGE) OPTIME
TOPICAL | Status: DC | PRN
  Administered 2017-11-14: 1 via TOPICAL

## 2017-11-14 MED ORDER — FENTANYL CITRATE (PF) 250 MCG/5ML IJ SOLN
INTRAMUSCULAR | Status: AC
  Filled 2017-11-14: qty 5

## 2017-11-14 MED ORDER — LIDOCAINE 2% (20 MG/ML) 5 ML SYRINGE
INTRAMUSCULAR | Status: AC
  Filled 2017-11-14: qty 5

## 2017-11-14 MED ORDER — METHYLPREDNISOLONE ACETATE 80 MG/ML IJ SUSP
INTRAMUSCULAR | Status: AC
  Filled 2017-11-14: qty 1

## 2017-11-14 MED ORDER — PROPOFOL 10 MG/ML IV BOLUS
INTRAVENOUS | Status: DC | PRN
  Administered 2017-11-14: 160 mg via INTRAVENOUS

## 2017-11-14 MED ORDER — FENTANYL CITRATE (PF) 100 MCG/2ML IJ SOLN
25.0000 ug | INTRAMUSCULAR | Status: DC | PRN
  Administered 2017-11-14 (×2): 50 ug via INTRAVENOUS

## 2017-11-14 MED ORDER — MIDAZOLAM HCL 5 MG/5ML IJ SOLN
INTRAMUSCULAR | Status: DC | PRN
  Administered 2017-11-14: 2 mg via INTRAVENOUS

## 2017-11-14 MED ORDER — FENTANYL CITRATE (PF) 250 MCG/5ML IJ SOLN
INTRAMUSCULAR | Status: DC | PRN
  Administered 2017-11-14 (×3): 50 ug via INTRAVENOUS
  Administered 2017-11-14: 100 ug via INTRAVENOUS
  Administered 2017-11-14: 50 ug via INTRAVENOUS

## 2017-11-14 MED ORDER — THROMBIN 5000 UNITS EX SOLR
CUTANEOUS | Status: DC | PRN
  Administered 2017-11-14 (×2): 5000 [IU] via TOPICAL

## 2017-11-14 MED ORDER — HYDROMORPHONE HCL 1 MG/ML IJ SOLN
INTRAMUSCULAR | Status: AC
  Filled 2017-11-14: qty 1

## 2017-11-14 MED ORDER — HYDROMORPHONE HCL 1 MG/ML IJ SOLN
0.2500 mg | INTRAMUSCULAR | Status: DC | PRN
  Administered 2017-11-14 (×3): 0.5 mg via INTRAVENOUS

## 2017-11-14 MED ORDER — LACTATED RINGERS IV SOLN
INTRAVENOUS | Status: DC
  Administered 2017-11-14 (×2): via INTRAVENOUS

## 2017-11-14 MED ORDER — MIDAZOLAM HCL 2 MG/2ML IJ SOLN
INTRAMUSCULAR | Status: AC
  Filled 2017-11-14: qty 2

## 2017-11-14 MED ORDER — METOPROLOL SUCCINATE ER 50 MG PO TB24
50.0000 mg | ORAL_TABLET | Freq: Every day | ORAL | Status: DC
  Administered 2017-11-14: 50 mg via ORAL
  Filled 2017-11-14: qty 1

## 2017-11-14 MED ORDER — FENTANYL CITRATE (PF) 100 MCG/2ML IJ SOLN
INTRAMUSCULAR | Status: DC | PRN
  Administered 2017-11-14: 100 ug via INTRAVENOUS

## 2017-11-14 MED ORDER — DEXAMETHASONE SODIUM PHOSPHATE 10 MG/ML IJ SOLN
INTRAMUSCULAR | Status: DC | PRN
  Administered 2017-11-14: 10 mg via INTRAVENOUS

## 2017-11-14 MED ORDER — FENTANYL CITRATE (PF) 100 MCG/2ML IJ SOLN
INTRAMUSCULAR | Status: AC
  Filled 2017-11-14: qty 2

## 2017-11-14 MED ORDER — METHYLPREDNISOLONE ACETATE 80 MG/ML IJ SUSP
INTRAMUSCULAR | Status: DC | PRN
  Administered 2017-11-14: 80 mg

## 2017-11-14 MED ORDER — LIDOCAINE-EPINEPHRINE 0.5 %-1:200000 IJ SOLN
INTRAMUSCULAR | Status: DC | PRN
  Administered 2017-11-14: 10 mL via INTRADERMAL

## 2017-11-14 MED ORDER — DEXAMETHASONE SODIUM PHOSPHATE 10 MG/ML IJ SOLN
INTRAMUSCULAR | Status: AC
  Filled 2017-11-14: qty 1

## 2017-11-14 MED ORDER — LIDOCAINE 2% (20 MG/ML) 5 ML SYRINGE
INTRAMUSCULAR | Status: DC | PRN
  Administered 2017-11-14: 80 mg via INTRAVENOUS

## 2017-11-14 MED ORDER — SUGAMMADEX SODIUM 200 MG/2ML IV SOLN
INTRAVENOUS | Status: AC
  Filled 2017-11-14: qty 2

## 2017-11-14 MED ORDER — ROCURONIUM BROMIDE 10 MG/ML (PF) SYRINGE
PREFILLED_SYRINGE | INTRAVENOUS | Status: AC
  Filled 2017-11-14: qty 5

## 2017-11-14 MED ORDER — METOPROLOL SUCCINATE ER 25 MG PO TB24
ORAL_TABLET | ORAL | Status: AC
  Filled 2017-11-14: qty 2

## 2017-11-14 MED ORDER — 0.9 % SODIUM CHLORIDE (POUR BTL) OPTIME
TOPICAL | Status: DC | PRN
  Administered 2017-11-14: 1000 mL

## 2017-11-14 SURGICAL SUPPLY — 54 items
ADH SKN CLS APL DERMABOND .7 (GAUZE/BANDAGES/DRESSINGS) ×1
APL SKNCLS STERI-STRIP NONHPOA (GAUZE/BANDAGES/DRESSINGS)
BAG DECANTER FOR FLEXI CONT (MISCELLANEOUS) ×3 IMPLANT
BENZOIN TINCTURE PRP APPL 2/3 (GAUZE/BANDAGES/DRESSINGS) IMPLANT
BLADE CLIPPER SURG (BLADE) IMPLANT
BUR MATCHSTICK NEURO 3.0 LAGG (BURR) ×3 IMPLANT
BUR PRECISION FLUTE 5.0 (BURR) IMPLANT
CANISTER SUCT 3000ML PPV (MISCELLANEOUS) ×3 IMPLANT
CARTRIDGE OIL MAESTRO DRILL (MISCELLANEOUS) ×1 IMPLANT
CLOSURE WOUND 1/2 X4 (GAUZE/BANDAGES/DRESSINGS)
DECANTER SPIKE VIAL GLASS SM (MISCELLANEOUS) ×3 IMPLANT
DERMABOND ADVANCED (GAUZE/BANDAGES/DRESSINGS) ×2
DERMABOND ADVANCED .7 DNX12 (GAUZE/BANDAGES/DRESSINGS) ×1 IMPLANT
DIFFUSER DRILL AIR PNEUMATIC (MISCELLANEOUS) ×3 IMPLANT
DRAPE LAPAROTOMY 100X72X124 (DRAPES) ×3 IMPLANT
DRAPE MICROSCOPE LEICA (MISCELLANEOUS) ×3 IMPLANT
DRAPE POUCH INSTRU U-SHP 10X18 (DRAPES) ×3 IMPLANT
DRAPE SURG 17X23 STRL (DRAPES) ×3 IMPLANT
DURAPREP 26ML APPLICATOR (WOUND CARE) ×3 IMPLANT
ELECT BLADE 4.0 EZ CLEAN MEGAD (MISCELLANEOUS) ×3
ELECT REM PT RETURN 9FT ADLT (ELECTROSURGICAL) ×3
ELECTRODE BLDE 4.0 EZ CLN MEGD (MISCELLANEOUS) ×1 IMPLANT
ELECTRODE REM PT RTRN 9FT ADLT (ELECTROSURGICAL) ×1 IMPLANT
GAUZE SPONGE 4X4 12PLY STRL (GAUZE/BANDAGES/DRESSINGS) IMPLANT
GAUZE SPONGE 4X4 16PLY XRAY LF (GAUZE/BANDAGES/DRESSINGS) IMPLANT
GLOVE ECLIPSE 6.5 STRL STRAW (GLOVE) ×3 IMPLANT
GLOVE EXAM NITRILE LRG STRL (GLOVE) IMPLANT
GLOVE EXAM NITRILE XL STR (GLOVE) IMPLANT
GLOVE EXAM NITRILE XS STR PU (GLOVE) IMPLANT
GLOVE SURG SS PI 7.5 STRL IVOR (GLOVE) ×6 IMPLANT
GOWN STRL REUS W/ TWL LRG LVL3 (GOWN DISPOSABLE) ×2 IMPLANT
GOWN STRL REUS W/ TWL XL LVL3 (GOWN DISPOSABLE) IMPLANT
GOWN STRL REUS W/TWL 2XL LVL3 (GOWN DISPOSABLE) IMPLANT
GOWN STRL REUS W/TWL LRG LVL3 (GOWN DISPOSABLE) ×6
GOWN STRL REUS W/TWL XL LVL3 (GOWN DISPOSABLE)
KIT BASIN OR (CUSTOM PROCEDURE TRAY) ×3 IMPLANT
KIT ROOM TURNOVER OR (KITS) ×3 IMPLANT
NEEDLE HYPO 25X1 1.5 SAFETY (NEEDLE) ×3 IMPLANT
NEEDLE SPNL 18GX3.5 QUINCKE PK (NEEDLE) IMPLANT
NS IRRIG 1000ML POUR BTL (IV SOLUTION) ×3 IMPLANT
OIL CARTRIDGE MAESTRO DRILL (MISCELLANEOUS) ×3
PACK LAMINECTOMY NEURO (CUSTOM PROCEDURE TRAY) ×3 IMPLANT
PAD ARMBOARD 7.5X6 YLW CONV (MISCELLANEOUS) ×9 IMPLANT
RUBBERBAND STERILE (MISCELLANEOUS) ×6 IMPLANT
SPONGE LAP 4X18 X RAY DECT (DISPOSABLE) IMPLANT
SPONGE SURGIFOAM ABS GEL SZ50 (HEMOSTASIS) ×3 IMPLANT
STRIP CLOSURE SKIN 1/2X4 (GAUZE/BANDAGES/DRESSINGS) IMPLANT
SUT VIC AB 0 CT1 18XCR BRD8 (SUTURE) ×1 IMPLANT
SUT VIC AB 0 CT1 8-18 (SUTURE) ×3
SUT VIC AB 2-0 CT1 18 (SUTURE) ×3 IMPLANT
SUT VIC AB 3-0 SH 8-18 (SUTURE) ×3 IMPLANT
TOWEL GREEN STERILE (TOWEL DISPOSABLE) ×3 IMPLANT
TOWEL GREEN STERILE FF (TOWEL DISPOSABLE) ×3 IMPLANT
WATER STERILE IRR 1000ML POUR (IV SOLUTION) ×3 IMPLANT

## 2017-11-14 NOTE — Progress Notes (Signed)
D; c/o lips numbness, Dr Nyoka Cowden VS pt. Explained. Alert ox4, drinking coke now.

## 2017-11-14 NOTE — Op Note (Signed)
11/14/2017  10:50 PM  PATIENT:  Tamara Cross  52 y.o. female  PRE-OPERATIVE DIAGNOSIS:  [place], LUMBAR 5,S1  POST-OPERATIVE DIAGNOSIS:  [place], LUMBAR 5/S1  PROCEDURE:  Procedure(s): DISCECTOMY LUMBAR Five - SACRAL - one, LEFT  SURGEON:   Surgeon(s): Ashok Pall, MD Ditty, Kevan Ny, MD  ASSISTANTS:Ditty, [name]  ANESTHESIA:   general  EBL:  Total I/O In: 1600 [I.V.:1600] Out: 100 [Blood:100]  BLOOD ADMINISTERED:none  CELL SAVER GIVEN:none  COUNT:per nursing  DRAINS: none   SPECIMEN:  No Specimen  DICTATION: Mrs.Horvath was taken to the operating room, intubated and placed under a general anesthetic without difficulty. She was positioned prone on a Wilson frame with all pressure points padded. Her back was prepped and draped in a sterile manner. I opened the skin with a 10 blade and carried the dissection down to the thoracolumbar fascia. I used both sharp dissection and the monopolar cautery to expose the lamina of L5, and S1. I confirmed my location with an intraoperative xray.  I used the drill, Kerrison punches, and curettes to perform a semihemilaminectomy of L5. I used the punches to remove the ligamentum flavum to expose the thecal sac. I brought the microscope into the operative field and with Dr.Ditty's assistance we started our decompression of the spinal canal, thecal sac and S1 root(s). I cauterized epidural veins overlying the disc space then divided them sharply. I opened the disc space with a 15 blade and proceeded with the discectomy. I used pituitary rongeurs, curettes, and other instruments to remove disc material. After the discectomy was completed I inspected the S1 nerve root and felt it was well decompressed. I explored rostrally, laterally, medially, and caudally and was satisfied with the decompression. I bathed the wound with a mixture of depomedrol and fentanyl I irrigated the wound, then closed in layers. I approximated the  thoracolumbar fascia, subcutaneous, and subcuticular planes with vicryl sutures. I used dermabond for a sterile dressing.   PLAN OF CARE: Admit for overnight observation  PATIENT DISPOSITION:  PACU - hemodynamically stable.   Delay start of Pharmacological VTE agent (>24hrs) due to surgical blood loss or risk of bleeding:  yes

## 2017-11-14 NOTE — H&P (Signed)
Tamara Cross is an 52 y.o. female.   Chief Complaint: left lower extremity pain HPI: Tamara Cross presents with weakness in the left lower extremity and an MRI showing a herniated disc at L5/s1 eccentric to the left.   Past Medical History:  Diagnosis Date  . Allergy    chroni rhinitis  . Anxiety   . Arthritis   . 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 Complication of anesthesia    last 2 surgeries had some tachycardia  . Depression   . Dysrhythmia    tachy occ  . Esophageal stricture   . GERD (gastroesophageal reflux disease)   . H/O hiatal hernia   . Heart murmur    MVP  . IBS (irritable bowel syndrome)   . Migraine   . MVP (mitral valve prolapse)   . Osteoarthritis   . Peptic ulcer disease   . Pneumonia    hx  . PONV (postoperative nausea and vomiting)   . S/P radiation therapy  06/28/2014-08/08/2014    1) Right Breast / 50 Gy in 25 fractions/ 2) Right Breast Boost / 10 Gy in 5 fractions  . Skin cancer    basal cell     Past Surgical History:  Procedure Laterality Date  . APPENDECTOMY    . BREAST LUMPECTOMY Bilateral   . BREAST LUMPECTOMY WITH NEEDLE LOCALIZATION Bilateral 05/02/2014   Procedure: BILATERIAL BREAST LUMPECTOMY WITH NEEDLE LOCALIZATION;  Surgeon: Shann Medal, MD;  Location: [facility];  Service: General;  Laterality: Bilateral;  . CERVIX LESION DESTRUCTION    . CESAREAN SECTION  2002  . CHOLECYSTECTOMY  11/29/2014   lap chole   . CHOLECYSTECTOMY N/A 11/29/2014   Procedure: LAPAROSCOPIC CHOLECYSTECTOMY WITH INTRAOPERATIVE CHOLANGIOGRAM;  Surgeon: Doreen Salvage, MD;  Location: [facility];  Service: General;  Laterality: N/A;  . COLONOSCOPY N/A 10/06/2014   Procedure: COLONOSCOPY;  Surgeon: Lafayette Dragon, MD;  Location: [facility];  Service: Endoscopy;  Laterality: N/A;  . COLPOSCOPY    . CYSTECTOMY     left breast,in 20's  . DILATION AND CURETTAGE OF UTERUS     x2  . ESOPHAGOGASTRODUODENOSCOPY N/A 10/06/2014   Procedure: ESOPHAGOGASTRODUODENOSCOPY (EGD);  Surgeon: Lafayette Dragon, MD;  Location: [facility];  Service: Endoscopy;  Laterality: N/A;  . HAND SURGERY     cancer removed basal cell  . NOVASURE ABLATION    . SHOULDER SURGERY Left    debridement ,bone spurs  . TONSILLECTOMY      Family History  Problem Relation Age of Onset  . Heart disease Father   . Hypertension Father   . Colon cancer Maternal Grandmother   . Depression Brother   . Diabetes Maternal Grandfather   . Heart disease Maternal Grandfather   . Colon cancer Maternal Uncle        x 2  . Colon polyps Unknown        x 9 maternal uncles/aunts  . Breast cancer Neg Hx    Social History:  reports that Tamara Cross has been smoking cigarettes.  Tamara Cross started smoking about 19 years ago. Tamara Cross has a 10.00 pack-year smoking history. Tamara Cross has never used smokeless tobacco. Tamara Cross reports that Tamara Cross drinks about 3.0 oz of alcohol per week. Tamara Cross reports that Tamara Cross does not use drugs.  Allergies:  Allergies  Allergen Reactions  . Imitrex [Sumatriptan] Anaphylaxis  . Sulfonamide Derivatives Shortness Of Breath  . Reglan [Metoclopramide] Other (See Comments)    REACTION:  Possible cause of SVT.  [name] Zofran [Ondansetron Hcl] Other (See Comments)    REACTION: Possible cause of SVT  . Ondansetron Palpitations    Medications Prior to Admission  Medication Sig Dispense Refill  . acetaminophen (TYLENOL) 500 MG tablet Take 1,000 mg by mouth daily as needed for mild pain.    [name] albuterol (PROVENTIL HFA;VENTOLIN HFA) 108 (90 Base) MCG/ACT inhaler Inhale 1-2 puffs into the lungs every 6 (six) hours as needed for wheezing or shortness of breath. (Patient taking differently: Inhale 2 puffs into the lungs every 6 (six) hours as needed for wheezing or shortness of breath. ) 1 Inhaler 0  . b complex vitamins tablet Take 1 tablet by mouth daily.    . budesonide-formoterol (SYMBICORT) 160-4.5 MCG/ACT inhaler Inhale 2 puffs into the lungs 2 (two) times daily. (Patient  taking differently: Inhale 2 puffs into the lungs 2 (two) times daily as needed (wheezing). ) 1 Inhaler 6  . cetirizine (ZYRTEC) 10 MG tablet Take 10 mg by mouth daily.    . Cholecalciferol (VITAMIN D PO) Take 1 tablet by mouth daily.    . Cholecalciferol (VITAMIN D3) 50000 units CAPS Take 1 capsule by mouth once a week. 6 capsule 0  . desvenlafaxine (PRISTIQ) 100 MG 24 hr tablet Take 100 mg by mouth daily.      [name] HYDROcodone-acetaminophen (NORCO/VICODIN) 5-325 MG tablet Take 1 tablet by mouth every 6 (six) hours as needed for moderate pain. 28 tablet 0  . ibuprofen (ADVIL,MOTRIN) 200 MG tablet Take 400 mg by mouth every 8 (eight) hours as needed for headache or mild pain.    . metoprolol succinate (TOPROL-XL) 50 MG 24 hr tablet Take 1 tablet (50 mg total) by mouth daily. Patient needs office visit before refills will be given 30 tablet 0  . mometasone (NASONEX) 50 MCG/ACT nasal spray Place 1 spray into the nose daily as needed (congestion).    . montelukast (SINGULAIR) 10 MG tablet Take 1 tablet (10 mg total) by mouth daily. 90 tablet 3  . predniSONE (DELTASONE) 10 MG tablet Prednisone 10 mg: take 4 tabs a day x 3 days; then 3 tabs a day x 4 days; then 2 tabs a day x 4 days, then 1 tab a day x 6 days, then stop. Take pc. (Patient taking differently: Take 10-40 mg by mouth See admin instructions. Prednisone 10 mg: take 4 tabs a day x 3 days; then 3 tabs a day x 4 days; then 2 tabs a day x 4 days, then 1 tab a day x 6 days, then stop. Take pc.) 38 tablet 1  . traZODone (DESYREL) 100 MG tablet Take 100 mg by mouth at bedtime as needed for sleep.     [name] VYVANSE 50 MG capsule Take 50 mg by mouth daily.  0  . doxycycline (VIBRA-TABS) 100 MG tablet Take 1 tablet (100 mg total) by mouth 2 (two) times daily. (Patient not taking: Reported on 11/13/2017) 20 tablet 0  . gabapentin (NEURONTIN) 100 MG capsule Take 1-2 capsules (100-200 mg total) by mouth 2 (two) times daily. (Patient not taking: Reported on 11/13/2017)  120 capsule 3  . ibuprofen (ADVIL,MOTRIN) 600 MG tablet Take 1 tablet (600 mg total) by mouth every 6 (six) hours as needed. (Patient not taking: Reported on 11/13/2017) 30 tablet 0  . pantoprazole (PROTONIX) 40 MG tablet Take 1 tablet (40 mg total) by mouth daily. (Patient not taking: Reported on 11/13/2017) 90 tablet 3  . tamoxifen (NOLVADEX) 20 MG  tablet Take 0.5 tablets (10 mg total) by mouth daily. (Patient not taking: Reported on 11/13/2017) 90 tablet 3  . valACYclovir (VALTREX) 500 MG tablet Take 500 mg by mouth daily as needed (for breakouts). Reported on 12/20/2015      No results found for this or any previous visit (from the past 48 hour(s)). No results found.  ROS  Blood pressure 128/78, pulse 96, temperature 98.6 F (37 C), temperature source Oral, resp. rate 18, height 5\' 4"  (1.626 m), weight 90.7 kg (200 lb), last menstrual period 04/11/2012, SpO2 98 %. Physical Exam  Constitutional: Tamara Cross is oriented to person, place, and time. Tamara Cross appears well-developed and well-nourished. Tamara Cross appears distressed.  HENT:  Head: Normocephalic and atraumatic.  Eyes: Conjunctivae and EOM are normal. Pupils are equal, round, and reactive to light.  Neck: Normal range of motion. Neck supple.  Cardiovascular: Normal rate, regular rhythm, normal heart sounds and intact distal pulses.  Respiratory: Effort normal and breath sounds normal.  GI: Soft.  Neurological: Tamara Cross is alert and oriented to person, place, and time. Tamara Cross displays abnormal reflex. A sensory deficit is present. No cranial nerve deficit. Tamara Cross exhibits normal muscle tone. Tamara Cross displays a negative Romberg sign. Coordination normal.  Reflex Scores:      Tricep reflexes are 2+ on the right side and 2+ on the left side.      Bicep reflexes are 2+ on the right side and 2+ on the left side.      Brachioradialis reflexes are 2+ on the right side and 2+ on the left side.      Patellar reflexes are 2+ on the right side and 2+ on the left side.       Achilles reflexes are 2+ on the right side and 0 on the left side. Weak gastrocnemius, left. Difficulty toe walking on left..  Skin: Skin is warm and dry.  Psychiatric: Tamara Cross has a normal mood and affect. Her behavior is normal. Judgment and thought content normal.     Assessment/Plan OR for lumbar discetomy. BP 128/78   Pulse 96   Temp 98.6 F (37 C) (Oral)   Resp 18   Ht 5\' 4"  (1.626 m)   Wt 90.7 kg (200 lb)   LMP 04/11/2012   SpO2 98%   BMI 34.33 kg/m  Tamara Cross has decided to undergo a lumbar discetomy/decompression for an HNP at levels L5/S1. Risks and benefits including but not limited to bleeding, infection, paralysis, weakness in one or both extremities, bowel and/or bladder dysfunction, need for further surgery, no relief of pain. [name] understands and wishes to proceed.  Darlinda Bellows L, MD 11/14/2017, 8:17 PM

## 2017-11-14 NOTE — Anesthesia Preprocedure Evaluation (Signed)
Anesthesia Evaluation    History of Anesthesia Complications (+) PONV  Airway Mallampati: II  TM Distance: >3 FB     Dental   Pulmonary asthma , pneumonia, Current Smoker,    breath sounds clear to auscultation       Cardiovascular + Peripheral Vascular Disease  + dysrhythmias + Valvular Problems/Murmurs  Rhythm:Regular Rate:Normal     Neuro/Psych    GI/Hepatic Neg liver ROS, hiatal hernia, PUD, GERD  ,  Endo/Other  negative endocrine ROS  Renal/GU negative Renal ROS     Musculoskeletal   Abdominal   Peds  Hematology   Anesthesia Other Findings   Reproductive/Obstetrics                             Anesthesia Physical Anesthesia Plan  ASA: III  Anesthesia Plan: General   Post-op Pain Management:    Induction: Intravenous  PONV Risk Score and Plan: 3 and Treatment may vary due to age or medical condition, Ondansetron, Dexamethasone and Midazolam  Airway Management Planned:   Additional Equipment:   Intra-op Plan:   Post-operative Plan: Extubation in OR  Informed Consent: I have reviewed the patients History and Physical, chart, labs and discussed the procedure including the risks, benefits and alternatives for the proposed anesthesia with the patient or authorized representative who has indicated his/her understanding and acceptance.   Dental advisory given  Plan Discussed with:   Anesthesia Plan Comments:         Anesthesia Quick Evaluation

## 2017-11-14 NOTE — Anesthesia Procedure Notes (Signed)
Procedure Name: Intubation Date/Time: 11/14/2017 8:43 PM Performed by: Babs Bertin, CRNA Pre-anesthesia Checklist: Patient identified, Emergency Drugs available, Suction available and Patient being monitored Patient Re-evaluated:Patient Re-evaluated prior to induction Oxygen Delivery Method: Circle System Utilized Preoxygenation: Pre-oxygenation with 100% oxygen Induction Type: IV induction Ventilation: Mask ventilation without difficulty Laryngoscope Size: Mac and 3 Grade View: Grade I Tube type: Oral Tube size: 7.0 mm Number of attempts: 1 Airway Equipment and Method: Stylet and Oral airway Placement Confirmation: ETT inserted through vocal cords under direct vision,  positive ETCO2 and breath sounds checked- equal and bilateral Secured at: 21 cm Tube secured with: Tape Dental Injury: Teeth and Oropharynx as per pre-operative assessment

## 2017-11-14 NOTE — Anesthesia Postprocedure Evaluation (Signed)
Anesthesia Post Note  Patient: Tamara Cross  Procedure(s) Performed: DISCECTOMY LUMBAR Five - SACRAL - one, LEFT (Left Back)     Anesthesia Post Evaluation  Last Vitals:  Vitals:   11/14/17 2245 11/14/17 2300  BP: 122/76 136/87  Pulse: 96 96  Resp: (!) 25 20  Temp:    SpO2: 96% 96%    Last Pain:  Vitals:   11/14/17 2255  TempSrc:   PainSc: 10-Worst pain ever                 Estelene Carmack

## 2017-11-15 ENCOUNTER — Other Ambulatory Visit: Payer: Self-pay

## 2017-11-15 DIAGNOSIS — M5127 Other intervertebral disc displacement, lumbosacral region: Secondary | ICD-10-CM | POA: Diagnosis not present

## 2017-11-15 MED ORDER — ALBUTEROL SULFATE (2.5 MG/3ML) 0.083% IN NEBU: 3.0000 mL | INHALATION_SOLUTION | Freq: Four times a day (QID) | RESPIRATORY_TRACT | Status: DC | PRN

## 2017-11-15 MED ORDER — OXYCODONE HCL 5 MG PO TABS: 5.0000 mg | ORAL_TABLET | ORAL | Status: DC | PRN

## 2017-11-15 MED ORDER — KETOROLAC TROMETHAMINE 15 MG/ML IJ SOLN
15.0000 mg | Freq: Four times a day (QID) | INTRAMUSCULAR | Status: DC
  Administered 2017-11-15: 15 mg via INTRAVENOUS
  Filled 2017-11-15 (×2): qty 1

## 2017-11-15 MED ORDER — MENTHOL 3 MG MT LOZG: 1.0000 | LOZENGE | OROMUCOSAL | Status: DC | PRN

## 2017-11-15 MED ORDER — ACETAMINOPHEN 325 MG PO TABS: 650.0000 mg | ORAL_TABLET | ORAL | Status: DC | PRN

## 2017-11-15 MED ORDER — FLUTICASONE PROPIONATE 50 MCG/ACT NA SUSP: 1.0000 | Freq: Every day | NASAL | Status: DC | PRN

## 2017-11-15 MED ORDER — DIAZEPAM 5 MG PO TABS
5.0000 mg | ORAL_TABLET | Freq: Four times a day (QID) | ORAL | Status: DC | PRN
  Administered 2017-11-15: 5 mg via ORAL
  Filled 2017-11-15: qty 1

## 2017-11-15 MED ORDER — SODIUM CHLORIDE 0.9% FLUSH
3.0000 mL | Freq: Two times a day (BID) | INTRAVENOUS | Status: DC
  Administered 2017-11-15: 3 mL via INTRAVENOUS

## 2017-11-15 MED ORDER — LISDEXAMFETAMINE DIMESYLATE 30 MG PO CAPS
30.0000 mg | ORAL_CAPSULE | Freq: Every day | ORAL | Status: DC
Start: 2017-11-15 — End: 2017-11-15

## 2017-11-15 MED ORDER — OXYCODONE HCL 5 MG PO TABS
10.0000 mg | ORAL_TABLET | ORAL | Status: DC | PRN
  Administered 2017-11-15 (×2): 10 mg via ORAL
  Filled 2017-11-15 (×2): qty 2

## 2017-11-15 MED ORDER — SODIUM CHLORIDE 0.9 % IV SOLN: 250.0000 mL | INTRAVENOUS | Status: DC

## 2017-11-15 MED ORDER — B COMPLEX-C PO TABS
1.0000 | ORAL_TABLET | Freq: Every day | ORAL | Status: DC
  Filled 2017-11-15: qty 1

## 2017-11-15 MED ORDER — OXYCODONE HCL ER 10 MG PO T12A: 10.0000 mg | EXTENDED_RELEASE_TABLET | Freq: Two times a day (BID) | ORAL | Status: DC

## 2017-11-15 MED ORDER — LORATADINE 10 MG PO TABS: 10.0000 mg | ORAL_TABLET | Freq: Every day | ORAL | Status: DC

## 2017-11-15 MED ORDER — TRAZODONE HCL 100 MG PO TABS: 100.0000 mg | ORAL_TABLET | Freq: Every evening | ORAL | Status: DC | PRN

## 2017-11-15 MED ORDER — MORPHINE SULFATE (PF) 4 MG/ML IV SOLN
2.0000 mg | INTRAVENOUS | Status: DC | PRN
  Administered 2017-11-15: 2 mg via INTRAVENOUS
  Filled 2017-11-15: qty 1

## 2017-11-15 MED ORDER — MOMETASONE FURO-FORMOTEROL FUM 200-5 MCG/ACT IN AERO
2.0000 | INHALATION_SPRAY | Freq: Two times a day (BID) | RESPIRATORY_TRACT | Status: DC
  Filled 2017-11-15: qty 8.8

## 2017-11-15 MED ORDER — OXYCODONE HCL 5 MG PO TABS: 5.0000 mg | ORAL_TABLET | ORAL | 0 refills | Status: DC | PRN

## 2017-11-15 MED ORDER — PHENOL 1.4 % MT LIQD: 1.0000 | OROMUCOSAL | Status: DC | PRN

## 2017-11-15 MED ORDER — LISDEXAMFETAMINE DIMESYLATE 20 MG PO CAPS: 20.0000 mg | ORAL_CAPSULE | Freq: Every day | ORAL | Status: DC

## 2017-11-15 MED ORDER — ACETAMINOPHEN 650 MG RE SUPP: 650.0000 mg | RECTAL | Status: DC | PRN

## 2017-11-15 MED ORDER — LISDEXAMFETAMINE DIMESYLATE 50 MG PO CAPS: 50.0000 mg | ORAL_CAPSULE | Freq: Every day | ORAL | Status: DC

## 2017-11-15 MED ORDER — HYDROXYZINE HCL 25 MG PO TABS: 50.0000 mg | ORAL_TABLET | ORAL | Status: DC | PRN

## 2017-11-15 MED ORDER — SODIUM CHLORIDE 0.9% FLUSH: 3.0000 mL | INTRAVENOUS | Status: DC | PRN

## 2017-11-15 MED ORDER — ZOLPIDEM TARTRATE 5 MG PO TABS: 5.0000 mg | ORAL_TABLET | Freq: Every evening | ORAL | Status: DC | PRN

## 2017-11-15 MED ORDER — MONTELUKAST SODIUM 10 MG PO TABS
10.0000 mg | ORAL_TABLET | Freq: Every day | ORAL | Status: DC
  Filled 2017-11-15: qty 1

## 2017-11-15 MED ORDER — HYDROXYZINE HCL 50 MG/ML IM SOLN: 50.0000 mg | INTRAMUSCULAR | Status: DC | PRN

## 2017-11-15 MED ORDER — CHOLECALCIFEROL 10 MCG (400 UNIT) PO TABS
400.0000 [IU] | ORAL_TABLET | Freq: Every day | ORAL | Status: DC
  Filled 2017-11-15: qty 1

## 2017-11-15 MED ORDER — VENLAFAXINE HCL ER 75 MG PO CP24: 150.0000 mg | ORAL_CAPSULE | Freq: Every day | ORAL | Status: DC

## 2017-11-15 MED ORDER — VALACYCLOVIR HCL 500 MG PO TABS: 500.0000 mg | ORAL_TABLET | Freq: Every day | ORAL | Status: DC | PRN

## 2017-11-15 MED ORDER — VITAMIN D3 1.25 MG (50000 UT) PO CAPS: 1.0000 | ORAL_CAPSULE | ORAL | Status: DC

## 2017-11-15 MED ORDER — METOPROLOL SUCCINATE ER 50 MG PO TB24: 50.0000 mg | ORAL_TABLET | Freq: Every day | ORAL | 0 refills | Status: DC

## 2017-11-15 NOTE — Plan of Care (Signed)
  Progressing Safety: Ability to remain free from injury will improve 11/15/2017 0156 - Progressing by Charlena Cross, RN

## 2017-11-15 NOTE — Progress Notes (Signed)
Pt arrived on the unit from PACU via stretcher accompanied by 2 RNs in severe pain, yelling and screaming at staff who were trying to assist her. She was irritable and agitated and even yelled at another RN who was trying to silence her beeping IV pump and refused her IV infusion reconnected altogether. As per PACU RN, she was upset with her delayed surgery and upon learning in the recovery that she was given Zofran though it was listed as one of her allergies. Out of her frustrations, she stated to one RN that she would not take any medications that will be offered her but would rather be in pain because she just trusts no one; she also stated that she will file a law suit against the hospital for the incidence. Staff remained calm and supportive to pt all throughout her outburst. Reassured her that she will be taken care while within the unit.

## 2017-11-15 NOTE — Discharge Summary (Signed)
Physician Discharge Summary  Patient ID: Tamara Cross MRN: [id number] DOB/AGE: [birth year] 54 y.o.  Admit date: 11/14/2017 Discharge date: 11/15/2017  Admission Diagnoses: L5-S1 herniated disc  Discharge Diagnoses: The same Active Problems:   HNP (herniated nucleus pulposus), lumbar   Discharged Condition: good  Hospital Course: Dr. Christella Noa performed a left L5-S1 discectomy on the patient on 11/14/2017.  The patient's postoperative course was unremarkable.  On postoperative day #1 she requested discharge home.  She has run out of her Toprol and requested a temporary prescription until she can get back to her primary doctor.  She was given written and oral discharge instructions.  All her questions were answered.  Consults: None Significant Diagnostic Studies: None Treatments: Left L5-S1 discectomy Discharge Exam: Blood pressure 125/80, pulse 85, temperature 98.8 F (37.1 C), temperature source Oral, resp. rate 18, height 5\' 4"  (1.626 m), weight 90.7 kg (200 lb), last menstrual period 04/11/2012, SpO2 98 %. The patient is alert and pleasant.  She looks well.  Her strength is normal.  Her wound is intact.  Disposition: Home  Discharge Instructions    Call MD for:  difficulty breathing, headache or visual disturbances   Complete by:  As directed    Call MD for:  extreme fatigue   Complete by:  As directed    Call MD for:  hives   Complete by:  As directed    Call MD for:  persistant dizziness or light-headedness   Complete by:  As directed    Call MD for:  persistant nausea and vomiting   Complete by:  As directed    Call MD for:  redness, tenderness, or signs of infection (pain, swelling, redness, odor or green/yellow discharge around incision site)   Complete by:  As directed    Call MD for:  severe uncontrolled pain   Complete by:  As directed    Call MD for:  temperature >100.4   Complete by:  As directed    Diet - low sodium heart healthy   Complete by:  As directed     Discharge instructions   Complete by:  As directed    Call [phone] for a followup appointment. Take a stool softener while you are using pain medications.   Driving Restrictions   Complete by:  As directed    Do not drive for 2 weeks.   Increase activity slowly   Complete by:  As directed    Lifting restrictions   Complete by:  As directed    Do not lift more than 5 pounds. No excessive bending or twisting.   May shower / Bathe   Complete by:  As directed    He may shower after the pain she is removed 3 days after surgery. Leave the incision alone.   Remove dressing in 48 hours   Complete by:  As directed    Your stitches are under the scan and will dissolve by themselves. The Steri-Strips will fall off after you take a few showers. Do not rub back or pick at the wound, Leave the wound alone.     Allergies as of 11/15/2017      Reactions   Imitrex [sumatriptan] Anaphylaxis   Sulfonamide Derivatives Shortness Of Breath   Reglan [metoclopramide] Other (See Comments)   REACTION: Possible cause of SVT.   Zofran [ondansetron Hcl] Other (See Comments)   REACTION: Possible cause of SVT   Ondansetron Palpitations      Medication List    STOP taking  these medications   doxycycline 100 MG tablet Commonly known as:  VIBRA-TABS   HYDROcodone-acetaminophen 5-325 MG tablet Commonly known as:  NORCO/VICODIN     TAKE these medications   acetaminophen 500 MG tablet Commonly known as:  TYLENOL Take 1,000 mg by mouth daily as needed for mild pain.   albuterol 108 (90 Base) MCG/ACT inhaler Commonly known as:  PROVENTIL HFA;VENTOLIN HFA Inhale 1-2 puffs into the lungs every 6 (six) hours as needed for wheezing or shortness of breath. What changed:  how much to take   b complex vitamins tablet Take 1 tablet by mouth daily.   budesonide-formoterol 160-4.5 MCG/ACT inhaler Commonly known as:  SYMBICORT Inhale 2 puffs into the lungs 2 (two) times daily. What changed:    when to  take this  reasons to take this   cetirizine 10 MG tablet Commonly known as:  ZYRTEC Take 10 mg by mouth daily.   desvenlafaxine 100 MG 24 hr tablet Commonly known as:  PRISTIQ Take 100 mg by mouth daily.   gabapentin 100 MG capsule Commonly known as:  NEURONTIN Take 1-2 capsules (100-200 mg total) by mouth 2 (two) times daily.   ibuprofen 200 MG tablet Commonly known as:  ADVIL,MOTRIN Take 400 mg by mouth every 8 (eight) hours as needed for headache or mild pain.   ibuprofen 600 MG tablet Commonly known as:  ADVIL,MOTRIN Take 1 tablet (600 mg total) by mouth every 6 (six) hours as needed.   metoprolol succinate 50 MG 24 hr tablet Commonly known as:  TOPROL-XL Take 1 tablet (50 mg total) by mouth daily. Patient needs office visit before refills will be given What changed:  Another medication with the same name was added. Make sure you understand how and when to take each.   metoprolol succinate 50 MG 24 hr tablet Commonly known as:  TOPROL-XL Take 1 tablet (50 mg total) by mouth daily. Take with or immediately following a meal. What changed:  You were already taking a medication with the same name, and this prescription was added. Make sure you understand how and when to take each.   mometasone 50 MCG/ACT nasal spray Commonly known as:  NASONEX Place 1 spray into the nose daily as needed (congestion).   montelukast 10 MG tablet Commonly known as:  SINGULAIR Take 1 tablet (10 mg total) by mouth daily.   oxyCODONE 5 MG immediate release tablet Commonly known as:  Oxy IR/ROXICODONE Take 1 tablet (5 mg total) by mouth every 4 (four) hours as needed for moderate pain ((score 4 to 6)).   predniSONE 10 MG tablet Commonly known as:  DELTASONE Prednisone 10 mg: take 4 tabs a day x 3 days; then 3 tabs a day x 4 days; then 2 tabs a day x 4 days, then 1 tab a day x 6 days, then stop. Take pc. What changed:    how much to take  how to take this  when to take  this  additional instructions   traZODone 100 MG tablet Commonly known as:  DESYREL Take 100 mg by mouth at bedtime as needed for sleep.   valACYclovir 500 MG tablet Commonly known as:  VALTREX Take 500 mg by mouth daily as needed (for breakouts). Reported on 12/20/2015   VITAMIN D PO Take 1 tablet by mouth daily.   Vitamin D3 50000 units Caps Take 1 capsule by mouth once a week.   VYVANSE 50 MG capsule Generic drug:  lisdexamfetamine Take 50 mg by mouth  daily.        Signed: Ophelia Charter 11/15/2017, 8:25 AM

## 2017-11-15 NOTE — Progress Notes (Signed)
Patient is discharged from room 3C03 at this time. Alert and in stable condition. IV site d/c'[d and instructions read to patient and spouse with understanding verbalized. Left unit via wheelchair with all belongings at side. 

## 2017-11-15 NOTE — Discharge Summary (Signed)
Physician Discharge Summary  Patient ID: Tamara Cross MRN: [id number] DOB/AGE: [birth year] 52 y.o.  Admit date: 11/14/2017 Discharge date: 11/15/2017  Admission Diagnoses: Left L5-S1 herniated disc  Discharge Diagnoses: The same Active Problems:   HNP (herniated nucleus pulposus), lumbar   Discharged Condition: good  Hospital Course: Dr. Christella Noa performed a left L5-S1 discectomy on the patient on 11/14/2017.  The patient's postoperative course was unremarkable.  On postoperative day #1 the patient requested discharge home.  The patient was given written and oral discharge instructions.  All her questions were answered.  Consults: None Significant Diagnostic Studies: None Treatments: Left L5-S1 discectomy Discharge Exam: Blood pressure 125/80, pulse 85, temperature 98.8 F (37.1 C), temperature source Oral, resp. rate 18, height 5\' 4"  (1.626 m), weight 90.7 kg (200 lb), last menstrual period 04/11/2012, SpO2 98 %. Patient is alert and pleasant.  She looks well.  Her wound is healing well.  There is no drainage.  Her strength is normal.  Disposition: Home  Discharge Instructions    Call MD for:  difficulty breathing, headache or visual disturbances   Complete by:  As directed    Call MD for:  extreme fatigue   Complete by:  As directed    Call MD for:  hives   Complete by:  As directed    Call MD for:  persistant dizziness or light-headedness   Complete by:  As directed    Call MD for:  persistant nausea and vomiting   Complete by:  As directed    Call MD for:  redness, tenderness, or signs of infection (pain, swelling, redness, odor or green/yellow discharge around incision site)   Complete by:  As directed    Call MD for:  severe uncontrolled pain   Complete by:  As directed    Call MD for:  temperature >100.4   Complete by:  As directed    Diet - low sodium heart healthy   Complete by:  As directed    Discharge instructions   Complete by:  As directed    Call  [phone] for a followup appointment. Take a stool softener while you are using pain medications.   Driving Restrictions   Complete by:  As directed    Do not drive for 2 weeks.   Increase activity slowly   Complete by:  As directed    Lifting restrictions   Complete by:  As directed    Do not lift more than 5 pounds. No excessive bending or twisting.   May shower / Bathe   Complete by:  As directed    He may shower after the pain she is removed 3 days after surgery. Leave the incision alone.   Remove dressing in 48 hours   Complete by:  As directed    Your stitches are under the scan and will dissolve by themselves. The Steri-Strips will fall off after you take a few showers. Do not rub back or pick at the wound, Leave the wound alone.     Allergies as of 11/15/2017      Reactions   Imitrex [sumatriptan] Anaphylaxis   Sulfonamide Derivatives Shortness Of Breath   Reglan [metoclopramide] Other (See Comments)   REACTION: Possible cause of SVT.   Zofran [ondansetron Hcl] Other (See Comments)   REACTION: Possible cause of SVT   Ondansetron Palpitations      Medication List    STOP taking these medications   doxycycline 100 MG tablet Commonly known as:  VIBRA-TABS  HYDROcodone-acetaminophen 5-325 MG tablet Commonly known as:  NORCO/VICODIN     TAKE these medications   acetaminophen 500 MG tablet Commonly known as:  TYLENOL Take 1,000 mg by mouth daily as needed for mild pain.   albuterol 108 (90 Base) MCG/ACT inhaler Commonly known as:  PROVENTIL HFA;VENTOLIN HFA Inhale 1-2 puffs into the lungs every 6 (six) hours as needed for wheezing or shortness of breath. What changed:  how much to take   b complex vitamins tablet Take 1 tablet by mouth daily.   budesonide-formoterol 160-4.5 MCG/ACT inhaler Commonly known as:  SYMBICORT Inhale 2 puffs into the lungs 2 (two) times daily. What changed:    when to take this  reasons to take this   cetirizine 10 MG  tablet Commonly known as:  ZYRTEC Take 10 mg by mouth daily.   desvenlafaxine 100 MG 24 hr tablet Commonly known as:  PRISTIQ Take 100 mg by mouth daily.   gabapentin 100 MG capsule Commonly known as:  NEURONTIN Take 1-2 capsules (100-200 mg total) by mouth 2 (two) times daily.   ibuprofen 200 MG tablet Commonly known as:  ADVIL,MOTRIN Take 400 mg by mouth every 8 (eight) hours as needed for headache or mild pain.   ibuprofen 600 MG tablet Commonly known as:  ADVIL,MOTRIN Take 1 tablet (600 mg total) by mouth every 6 (six) hours as needed.   metoprolol succinate 50 MG 24 hr tablet Commonly known as:  TOPROL-XL Take 1 tablet (50 mg total) by mouth daily. Patient needs office visit before refills will be given   mometasone 50 MCG/ACT nasal spray Commonly known as:  NASONEX Place 1 spray into the nose daily as needed (congestion).   montelukast 10 MG tablet Commonly known as:  SINGULAIR Take 1 tablet (10 mg total) by mouth daily.   oxyCODONE 5 MG immediate release tablet Commonly known as:  Oxy IR/ROXICODONE Take 1 tablet (5 mg total) by mouth every 4 (four) hours as needed for moderate pain ((score 4 to 6)).   predniSONE 10 MG tablet Commonly known as:  DELTASONE Prednisone 10 mg: take 4 tabs a day x 3 days; then 3 tabs a day x 4 days; then 2 tabs a day x 4 days, then 1 tab a day x 6 days, then stop. Take pc. What changed:    how much to take  how to take this  when to take this  additional instructions   traZODone 100 MG tablet Commonly known as:  DESYREL Take 100 mg by mouth at bedtime as needed for sleep.   valACYclovir 500 MG tablet Commonly known as:  VALTREX Take 500 mg by mouth daily as needed (for breakouts). Reported on 12/20/2015   VITAMIN D PO Take 1 tablet by mouth daily.   Vitamin D3 50000 units Caps Take 1 capsule by mouth once a week.   VYVANSE 50 MG capsule Generic drug:  lisdexamfetamine Take 50 mg by mouth daily.         Signed: Ophelia Charter 11/15/2017, 7:58 AM

## 2017-11-15 NOTE — Evaluation (Signed)
Physical Therapy Evaluation Patient Details Name: Tamara Cross MRN: [id number] DOB: 08-04-1966 Today's Date: 11/15/2017   History of Present Illness  Patient is a 52 yo female s/p DISCECTOMY LUMBAR Five - SACRAL - one, LEFT side  Clinical Impression  Patient seen for mobility assessment s/p spinal surgery. Mobilize well. Educated patient on precautions, mobility expectations, safety and car transfers. No further acute PT needs. Will sign off.     Follow Up Recommendations No PT follow up    Equipment Recommendations  None recommended by PT    Recommendations for Other Services       Precautions / Restrictions Precautions Precautions: Back Precaution Booklet Issued: Yes (comment) Precaution Comments: verbally reviewed Restrictions Weight Bearing Restrictions: No      Mobility  Bed Mobility Overal bed mobility: Independent                Transfers Overall transfer level: Independent                  Ambulation/Gait Ambulation/Gait assistance: Independent Ambulation Distance (Feet): 340 Feet Assistive device: None Gait Pattern/deviations: WFL(Within Functional Limits)        Stairs Stairs: Yes Stairs assistance: Modified independent (Device/Increase time) Stair Management: One rail Right Number of Stairs: 4 General stair comments: no difficulty  Wheelchair Mobility    Modified Rankin (Stroke Patients Only)       Balance Overall balance assessment: No apparent balance deficits (not formally assessed)                                           Pertinent Vitals/Pain      Home Living Family/patient expects to be discharged to:: Private residence Living Arrangements: Spouse/significant other Available Help at Discharge: Family Type of Home: House Home Access: Stairs to enter Entrance Stairs-Rails: Can reach both Entrance Stairs-Number of Steps: 4 Home Layout: One level Home Equipment: Shower seat      Prior  Function Level of Independence: Independent               Hand Dominance        Extremity/Trunk Assessment   Upper Extremity Assessment Upper Extremity Assessment: Overall WFL for tasks assessed    Lower Extremity Assessment Lower Extremity Assessment: Overall WFL for tasks assessed       Communication      Cognition Arousal/Alertness: Awake/alert Behavior During Therapy: WFL for tasks assessed/performed Overall Cognitive Status: Within Functional Limits for tasks assessed                                        General Comments      Exercises     Assessment/Plan    PT Assessment Patent does not need any further PT services  PT Problem List         PT Treatment Interventions      PT Goals (Current goals can be found in the Care Plan section)  Acute Rehab PT Goals PT Goal Formulation: All assessment and education complete, DC therapy    Frequency     Barriers to discharge        Co-evaluation               AM-PAC PT "6 Clicks" Daily Activity  Outcome Measure Difficulty turning over in bed (including  adjusting bedclothes, sheets and blankets)?: None Difficulty moving from lying on back to sitting on the side of the bed? : None Difficulty sitting down on and standing up from a chair with arms (e.g., wheelchair, bedside commode, etc,.)?: None Help needed moving to and from a bed to chair (including a wheelchair)?: None Help needed walking in hospital room?: None Help needed climbing 3-5 steps with a railing? : A Little 6 Click Score: 23    End of Session   Activity Tolerance: No increased pain;Patient tolerated treatment well Patient left: in bed(sitting EOB) Nurse Communication: Mobility status PT Visit Diagnosis: Difficulty in walking, not elsewhere classified (R26.2)    Time: 0702-0719 PT Time Calculation (min) (ACUTE ONLY): 17 min   Charges:   PT Evaluation $PT Eval Low Complexity: 1 Low     PT G Codes:         Alben Deeds, PT DPT  Board Certified Neurologic Specialist Helper 11/15/2017, 7:24 AM

## 2017-11-15 NOTE — Progress Notes (Signed)
Pt was offered her scheduled pain medications after midnight, of which she gladly accepted. She asked for the unwrapped medication from the RN, read the label, opened it and took it herself. She was in a much better mood than when she first arrived on the unit. Husband at bedside was calm and collected as well; pt's anger and frustrations an hour ago was observed to have finally dissipated. She requested assistance to the bathroom where husband led her by the hand in a slow pace. After which, pt ambulated down the hallway assisted by her husband without difficulty. She stated she wanted to walk so she could go home the following morning. She also verbalized the great improvement she felt on both feet where prior to surgery, she could not stand up long enough, both feet will start to shake and would just sit due to weakness. Overall, pt's mood and behavior had a complete turn around from the last hour she arrived on the unit; she was pleasant and appreciative for the care she was given her during this time. Will continue to monitor pt.

## 2017-11-15 NOTE — Transfer of Care (Signed)
Immediate Anesthesia Transfer of Care Note  Patient: Tamara Cross  Procedure(s) Performed: DISCECTOMY LUMBAR Five - SACRAL - one, LEFT (Left Back)  Patient Location: PACU  Anesthesia Type:General  Level of Consciousness: awake, alert  and oriented  Airway & Oxygen Therapy: Patient Spontanous Breathing and Patient connected to nasal cannula oxygen  Post-op Assessment: Report given to RN and Post -op Vital signs reviewed and stable  Post vital signs: Reviewed and stable  Last Vitals:  Vitals:   11/14/17 2340 11/15/17 0010  BP: 130/78 (!) 144/87  Pulse: 84 91  Resp: 19 20  Temp: (!) 36.3 C 36.9 C  SpO2: 98% 100%    Last Pain:  Vitals:   11/15/17 0203  TempSrc:   PainSc: 7       Patients Stated Pain Goal: 3 (90/38/33 3832)  Complications: No apparent anesthesia complications

## 2017-11-15 NOTE — Anesthesia Postprocedure Evaluation (Signed)
Anesthesia Post Note  Patient: Tamara Cross  Procedure(s) Performed: DISCECTOMY LUMBAR Five - SACRAL - one, LEFT (Left Back)     Patient location during evaluation: PACU Anesthesia Type: General Level of consciousness: awake Pain management: pain level controlled Vital Signs Assessment: post-procedure vital signs reviewed and stable Respiratory status: spontaneous breathing Cardiovascular status: stable Anesthetic complications: no    Last Vitals:  Vitals:   11/14/17 2340 11/15/17 0010  BP: 130/78 (!) 144/87  Pulse: 84 91  Resp: 19 20  Temp: (!) 36.3 C 36.9 C  SpO2: 98% 100%    Last Pain:  Vitals:   11/15/17 0120  TempSrc:   PainSc: 8                  Kentrell Hallahan

## 2017-11-16 ENCOUNTER — Encounter (HOSPITAL_COMMUNITY): Payer: Self-pay | Admitting: Neurosurgery

## 2017-11-17 ENCOUNTER — Telehealth: Payer: Self-pay | Admitting: *Deleted

## 2017-11-17 NOTE — Telephone Encounter (Signed)
Pt was on TCM list admitted 11/14/17 for HNP (herniated nucleus pulposus), lumbar. Pt had a left L5-S1 discectomy on the patient on 11/14/2017 and was D/C 11/15/17. Pt will be following up w/specilaist Newman Pies, MD in 2 weeks.Marland KitchenJohny Chess

## 2017-12-21 ENCOUNTER — Ambulatory Visit (HOSPITAL_COMMUNITY)
Admission: EM | Admit: 2017-12-21 | Discharge: 2017-12-21 | Disposition: A | Discharge status: Home / Self Care | Payer: 59 | Attending: Urgent Care | Admitting: Urgent Care

## 2017-12-21 ENCOUNTER — Encounter (HOSPITAL_COMMUNITY): Payer: Self-pay | Admitting: *Deleted

## 2017-12-21 DIAGNOSIS — H6690 Otitis media, unspecified, unspecified ear: Secondary | ICD-10-CM | POA: Diagnosis not present

## 2017-12-21 MED ORDER — CEFDINIR 300 MG PO CAPS: 300.0000 mg | ORAL_CAPSULE | Freq: Two times a day (BID) | ORAL | 0 refills | Status: DC

## 2017-12-21 NOTE — ED Provider Notes (Signed)
  MRN: [id number] DOB: [birth year]  Subjective:   Tamara Cross is a 52 y.o. female presenting for 4 day history of severe right ear pain, swelling, fullness, subjective fever. Denies ear drainage, tinnitus, dizziness, trauma. She uses Zyrtec daily. Also takes ibuprofen and oxycodone for pain.    Tamara Cross is allergic to imitrex [sumatriptan]; sulfonamide derivatives; reglan [metoclopramide]; zofran [ondansetron hcl]; and ondansetron.  Tamara Cross  has a past medical history of Allergy, Anxiety, Arthritis, Breast cancer ([place]) (9/83/38), Complication of anesthesia, Depression, Dysrhythmia, Esophageal stricture, GERD (gastroesophageal reflux disease), H/O hiatal hernia, Heart murmur, IBS (irritable bowel syndrome), Migraine, MVP (mitral valve prolapse), Osteoarthritis, Peptic ulcer disease, Pneumonia, PONV (postoperative nausea and vomiting), S/P radiation therapy ( 06/28/2014-08/08/2014), and Skin cancer. Also  has a past surgical history that includes Cesarean section (2002); Appendectomy; Tonsillectomy; Cystectomy; Breast lumpectomy (Bilateral); Shoulder surgery (Left); Dilation and curettage of uterus; Breast lumpectomy with needle localization (Bilateral, 05/02/2014); Colposcopy; Novasure ablation; Cervix lesion destruction; Esophagogastroduodenoscopy (N/A, 10/06/2014); Colonoscopy (N/A, 10/06/2014); Cholecystectomy (11/29/2014); Cholecystectomy (N/A, 11/29/2014); Hand surgery; and Lumbar laminectomy/decompression microdiscectomy (Left, 11/14/2017).  Objective:   Vitals: BP 139/84 (BP Location: Left Arm)   Pulse 94   Temp 98.5 F (36.9 C) (Oral)   Resp 16   LMP 04/11/2012   SpO2 98%   Physical Exam  Constitutional: She is oriented to person, place, and time. She appears well-developed and well-nourished.  HENT:  Right Ear: There is swelling. No drainage. No foreign bodies. No mastoid tenderness. Tympanic membrane is erythematous. Tympanic membrane is not perforated.  Left Ear: Tympanic membrane, external  ear and ear canal normal.  Cardiovascular: Normal rate.  Pulmonary/Chest: Effort normal.  Neurological: She is alert and oriented to person, place, and time.   Assessment and Plan :   Acute otitis media, unspecified otitis media type  Start cefdinir, use Sudafed, maintain Zyrtec and pain medications. Return-to-clinic precautions discussed, patient verbalized understanding.    Jaynee Eagles, PA-C 12/21/17 1704

## 2017-12-21 NOTE — ED Triage Notes (Addendum)
Patient reports right ear pain x 3-4 days, states she has been taking otc meds to help with pain and fever. Patient is on pain medication for recent back surgery, states that the medicine is not helping with the pain.

## 2017-12-21 NOTE — Discharge Instructions (Signed)
Please continue taking the pain medications prescribed.

## 2017-12-22 ENCOUNTER — Emergency Department (HOSPITAL_COMMUNITY)
Admission: EM | Admit: 2017-12-22 | Discharge: 2017-12-22 | Disposition: A | Discharge status: Home / Self Care | Payer: 59 | Attending: Physician Assistant | Admitting: Physician Assistant

## 2017-12-22 ENCOUNTER — Emergency Department (HOSPITAL_COMMUNITY): Payer: 59

## 2017-12-22 ENCOUNTER — Encounter: Payer: Self-pay | Admitting: Family

## 2017-12-22 ENCOUNTER — Encounter (HOSPITAL_COMMUNITY): Payer: Self-pay | Admitting: Emergency Medicine

## 2017-12-22 ENCOUNTER — Ambulatory Visit: Payer: 59 | Admitting: Family

## 2017-12-22 VITALS — BP 140/82 | HR 96 | Temp 98.4°F | Ht 64.0 in | Wt 217.0 lb

## 2017-12-22 DIAGNOSIS — H9201 Otalgia, right ear: Secondary | ICD-10-CM | POA: Diagnosis not present

## 2017-12-22 DIAGNOSIS — Z85828 Personal history of other malignant neoplasm of skin: Secondary | ICD-10-CM | POA: Diagnosis not present

## 2017-12-22 DIAGNOSIS — R51 Headache: Secondary | ICD-10-CM | POA: Insufficient documentation

## 2017-12-22 DIAGNOSIS — J45909 Unspecified asthma, uncomplicated: Secondary | ICD-10-CM | POA: Diagnosis not present

## 2017-12-22 DIAGNOSIS — F17210 Nicotine dependence, cigarettes, uncomplicated: Secondary | ICD-10-CM | POA: Insufficient documentation

## 2017-12-22 DIAGNOSIS — R05 Cough: Secondary | ICD-10-CM | POA: Diagnosis not present

## 2017-12-22 DIAGNOSIS — Z79899 Other long term (current) drug therapy: Secondary | ICD-10-CM | POA: Diagnosis not present

## 2017-12-22 LAB — BASIC METABOLIC PANEL
Anion gap: 9 (ref 5–15)
BUN: 8 mg/dL (ref 6–20)
CALCIUM: 9.2 mg/dL (ref 8.9–10.3)
CHLORIDE: 105 mmol/L (ref 101–111)
CO2: 28 mmol/L (ref 22–32)
CREATININE: 0.65 mg/dL (ref 0.44–1.00)
GFR calc Af Amer: >60 mL/min (ref >60)
Glucose, Bld: 92 mg/dL (ref 65–99)
Potassium: 4.4 mmol/L (ref 3.5–5.1)
SODIUM: 142 mmol/L (ref 135–145)

## 2017-12-22 LAB — CBC
HCT: 42.9 % (ref 36.0–46.0)
Hemoglobin: 14.4 g/dL (ref 12.0–15.0)
MCH: 30.8 pg (ref 26.0–34.0)
MCHC: 33.6 g/dL (ref 30.0–36.0)
MCV: 91.7 fL (ref 78.0–100.0)
PLATELETS: 308 10*3/uL (ref 150–400)
RBC: 4.68 MIL/uL (ref 3.87–5.11)
RDW: 12.9 % (ref 11.5–15.5)
WBC: 8.5 10*3/uL (ref 4.0–10.5)

## 2017-12-22 MED ORDER — PREDNISONE 20 MG PO TABS: 40.0000 mg | ORAL_TABLET | Freq: Every day | ORAL | 0 refills | Status: AC

## 2017-12-22 MED ORDER — CIPROFLOXACIN-DEXAMETHASONE 0.3-0.1 % OT SUSP: 4.0000 [drp] | Freq: Two times a day (BID) | OTIC | 0 refills | Status: DC

## 2017-12-22 MED ORDER — IOPAMIDOL (ISOVUE-300) INJECTION 61%
INTRAVENOUS | Status: AC
  Administered 2017-12-22: 75 mL
  Filled 2017-12-22: qty 75

## 2017-12-22 MED ORDER — SODIUM CHLORIDE 0.9 % IJ SOLN
INTRAMUSCULAR | Status: AC
  Filled 2017-12-22: qty 50

## 2017-12-22 MED ORDER — IBUPROFEN 200 MG PO TABS
400.0000 mg | ORAL_TABLET | Freq: Once | ORAL | Status: AC | PRN
  Administered 2017-12-22: 400 mg via ORAL
  Filled 2017-12-22: qty 2

## 2017-12-22 MED ORDER — FLUTICASONE PROPIONATE 50 MCG/ACT NA SUSP: 2.0000 | Freq: Every day | NASAL | 0 refills | Status: DC

## 2017-12-22 MED ORDER — MELOXICAM 15 MG PO TABS: 15.0000 mg | ORAL_TABLET | Freq: Every day | ORAL | 0 refills | Status: DC

## 2017-12-22 NOTE — ED Provider Notes (Signed)
[facility] DEPT Provider Note   CSN: [id number] Arrival date & time: 12/22/17  1552     History   Chief Complaint Chief Complaint  Patient presents with  . Otalgia    HPI Tamara Cross is a 52 y.o. female who presents the emergency department today for right ear otalgia. Patient states that 2 days ago she started having a dry, non-productive cough, nasal congestion, sinus pressure. She tried to treat this with over the counter, cough and cold medicine without relief.  She notes that the next morning she woke up with right ear otalgia that is worse to the touch.  She notes that her pain radiates up her her into her head, and down into her jaw.  It does not follow a dermatomal pattern.  She was seen at urgent care yesterday and diagnosed with a otitis media and discharged home on Omnicef.  The patient's PCP was concerned for mastoiditis so she was sent over here for CT imaging.  The patient denies any hearing changes, tinnitus, ear discharge, recent plane travel, recent swimming, trauma, trauma, dental pain, facial swelling, vesicular rash, fever, headache, numbness/tingling of the face, facial droop, visual changes, neck pain, trauma, or any other symptoms at that time.   HPI  Past Medical History:  Diagnosis Date  . Allergy    chroni rhinitis  . Anxiety   . Arthritis   . 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 Complication of anesthesia    last 2 surgeries had some tachycardia  . Depression   . Dysrhythmia    tachy occ  . Esophageal stricture   . GERD (gastroesophageal reflux disease)   . H/O hiatal hernia   . Heart murmur    MVP  . IBS (irritable bowel syndrome)   . Migraine   . MVP (mitral valve prolapse)   . Osteoarthritis   . Peptic ulcer disease   . Pneumonia    hx  . PONV (postoperative nausea and vomiting)   . S/P radiation therapy  06/28/2014-08/08/2014    1) Right Breast / 50 Gy in  25 fractions/ 2) Right Breast Boost / 10 Gy in 5 fractions  . Skin cancer    basal cell     Patient Active Problem List   Diagnosis Date Noted  . HNP (herniated nucleus pulposus), lumbar 11/14/2017  . Sciatic leg pain 10/21/2017  . Sinusitis, chronic 10/15/2016  . Asthmatic bronchitis with acute exacerbation 04/03/2016  . Acute URI 12/20/2015  . Ataxia 05/30/2015  . Liver lesion, left lobe 01/03/2015  . Gallstone pancreatitis 11/28/2014  . Dysphagia, pharyngoesophageal phase 10/06/2014  . Special screening for malignant neoplasm of colon 10/06/2014  . Dysphagia   . Special screening for malignant neoplasms, colon   . Arthralgia 09/12/2014  . Submuscular lipoma of chest 09/12/2014  . Leg pain 08/29/2014  . Hot flashes due to tamoxifen 08/29/2014  . LBP (low back pain) 04/26/2014  . Rash and nonspecific skin eruption 04/26/2014  . Malignant neoplasm of upper-outer quadrant of right female breast ([facility]) 04/13/2014  . Palpitations 11/18/2013  . Allergy   . Heart murmur   . HIP PAIN 09/07/2010  . PEDAL EDEMA 04/26/2010  . EXTERNAL HEMORRHOIDS 01/24/2010  . HIATAL HERNIA 01/24/2010  . Reflux esophagitis 12/19/2009  . GERD 12/19/2009  . Irritable bowel syndrome 12/19/2009  . 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  . Migraine 08/23/2008  . ESOPHAGEAL STRICTURE 03/11/2008  . POLYARTHRALGIA 03/11/2008  .  Obesity 05/01/2007  . ANXIETY 05/01/2007  . Chronic depressive disorder 05/01/2007  . PREMATURE VENTRICULAR CONTRACTIONS 05/01/2007  . Allergic rhinitis 05/01/2007  . MITRAL VALVE PROLAPSE, HX OF 05/01/2007  . PUD, HX OF 05/01/2007    Past Surgical History:  Procedure Laterality Date  . APPENDECTOMY    . BREAST LUMPECTOMY Bilateral   . BREAST LUMPECTOMY WITH NEEDLE LOCALIZATION Bilateral 05/02/2014   Procedure: BILATERIAL BREAST LUMPECTOMY WITH NEEDLE LOCALIZATION;  Surgeon: Shann Medal, MD;  Location: [facility];  Service: General;  Laterality: Bilateral;  . CERVIX LESION DESTRUCTION      . CESAREAN SECTION  2002  . CHOLECYSTECTOMY  11/29/2014   lap chole   . CHOLECYSTECTOMY N/A 11/29/2014   Procedure: LAPAROSCOPIC CHOLECYSTECTOMY WITH INTRAOPERATIVE CHOLANGIOGRAM;  Surgeon: Doreen Salvage, MD;  Location: [facility];  Service: General;  Laterality: N/A;  . COLONOSCOPY N/A 10/06/2014   Procedure: COLONOSCOPY;  Surgeon: Lafayette Dragon, MD;  Location: [facility];  Service: Endoscopy;  Laterality: N/A;  . COLPOSCOPY    . CYSTECTOMY     left breast,in 20's  . DILATION AND CURETTAGE OF UTERUS     x2  . ESOPHAGOGASTRODUODENOSCOPY N/A 10/06/2014   Procedure: ESOPHAGOGASTRODUODENOSCOPY (EGD);  Surgeon: Lafayette Dragon, MD;  Location: [facility];  Service: Endoscopy;  Laterality: N/A;  . HAND SURGERY     cancer removed basal cell  . LUMBAR LAMINECTOMY/DECOMPRESSION MICRODISCECTOMY Left 11/14/2017   Procedure: DISCECTOMY LUMBAR Five - SACRAL - one, LEFT;  Surgeon: Ashok Pall, MD;  Location: [facility];  Service: Neurosurgery;  Laterality: Left;  MICRODISCECTOMY LUMBAR 5- SACRAL 1, LEFT  . NOVASURE ABLATION    . SHOULDER SURGERY Left    debridement ,bone spurs  . TONSILLECTOMY      OB History    Gravida Para Term Preterm AB Living   1 1           SAB TAB Ectopic Multiple Live Births                  Obstetric Comments   She is not on hormones.  She had her last menstrual period around 2013.       Home Medications    Prior to Admission medications   Medication Sig Start Date End Date Taking? Authorizing Provider  albuterol (PROVENTIL HFA;VENTOLIN HFA) 108 (90 Base) MCG/ACT inhaler Inhale 1-2 puffs into the lungs every 6 (six) hours as needed for wheezing or shortness of breath. Patient taking differently: Inhale 2 puffs into the lungs every 6 (six) hours as needed for wheezing or shortness of breath.  07/31/17  Yes Plotnikov, Evie Lacks, MD  b complex vitamins tablet Take 1 tablet by mouth daily.   Yes [provider]  budesonide-formoterol (SYMBICORT) 160-4.5 MCG/ACT  inhaler Inhale 2 puffs into the lungs 2 (two) times daily. Patient taking differently: Inhale 2 puffs into the lungs 2 (two) times daily as needed (wheezing).  07/30/16  Yes Plotnikov, Evie Lacks, MD  cefdinir (OMNICEF) 300 MG capsule Take 1 capsule (300 mg total) by mouth 2 (two) times daily. 12/21/17  Yes Jaynee Eagles, PA-C  cetirizine (ZYRTEC) 10 MG tablet Take 10 mg by mouth daily.   Yes [provider]  Cholecalciferol (VITAMIN D3) 50000 units CAPS Take 1 capsule by mouth once a week. 10/21/17  Yes Plotnikov, Evie Lacks, MD  desvenlafaxine (PRISTIQ) 100 MG 24 hr tablet Take 100 mg by mouth daily.     Yes [provider]  fluticasone (FLONASE) 50 MCG/ACT nasal spray  Inhale 2 sprays into nostrils daily   Yes [provider]  ibuprofen (ADVIL,MOTRIN) 600 MG tablet Take 1 tablet (600 mg total) by mouth every 6 (six) hours as needed. Patient taking differently: Take 600 mg by mouth every 6 (six) hours as needed for moderate pain.  10/09/16  Yes Horton, Barbette Hair, MD  metoprolol succinate (TOPROL-XL) 50 MG 24 hr tablet Take 1 tablet (50 mg total) by mouth daily. Take with or immediately following a meal. 11/15/17  Yes Newman Pies, MD  montelukast (SINGULAIR) 10 MG tablet Take 1 tablet (10 mg total) by mouth daily. 10/21/17 10/21/18 Yes Plotnikov, Evie Lacks, MD  pseudoephedrine (SUDAFED) 30 MG tablet Take 60-120 mg by mouth daily as needed for congestion (pain).   Yes [provider]  VYVANSE 50 MG capsule Take 50 mg by mouth daily. 11/06/14  Yes [provider]  acetaminophen (TYLENOL) 500 MG tablet Take 1,000 mg by mouth daily as needed for mild pain.    [provider]  gabapentin (NEURONTIN) 100 MG capsule Take 1-2 capsules (100-200 mg total) by mouth 2 (two) times daily. Patient not taking: Reported on 12/22/2017 10/21/17   Plotnikov, Evie Lacks, MD  ibuprofen (ADVIL,MOTRIN) 200 MG tablet Take 400 mg by mouth every 8 (eight) hours as needed for headache  or mild pain.    [provider]  metoprolol succinate (TOPROL-XL) 50 MG 24 hr tablet Take 1 tablet (50 mg total) by mouth daily. Patient needs office visit before refills will be given Patient not taking: Reported on 12/22/2017 08/27/17   Plotnikov, Evie Lacks, MD  oxyCODONE (OXY IR/ROXICODONE) 5 MG immediate release tablet Take 1 tablet (5 mg total) by mouth every 4 (four) hours as needed for moderate pain ((score 4 to 6)). Patient not taking: Reported on 12/22/2017 11/15/17   Newman Pies, MD  predniSONE (DELTASONE) 10 MG tablet Prednisone 10 mg: take 4 tabs a day x 3 days; then 3 tabs a day x 4 days; then 2 tabs a day x 4 days, then 1 tab a day x 6 days, then stop. Take pc. Patient not taking: Reported on 12/22/2017 11/11/17   Plotnikov, Evie Lacks, MD  valACYclovir (VALTREX) 500 MG tablet Take 500 mg by mouth daily as needed (for breakouts). Reported on 12/20/2015    [provider]    Family History Family History  Problem Relation Age of Onset  . Heart disease Father   . Hypertension Father   . Colon cancer Maternal Grandmother   . Depression Brother   . Diabetes Maternal Grandfather   . Heart disease Maternal Grandfather   . Colon cancer Maternal Uncle        x 2  . Colon polyps Unknown        x 9 maternal uncles/aunts  . Breast cancer Neg Hx     Social History Social History   Tobacco Use  . Smoking status: Current Every Day Smoker    Packs/day: 1.00    Years: 10.00    Pack years: 10.00    Types: Cigarettes    Start date: 02/23/1998  . Smokeless tobacco: Never Used  Substance Use Topics  . Alcohol use: Yes    Alcohol/week: 3.0 oz    Types: 5 Glasses of wine per week    Comment: socially  . Drug use: No    Comment: quit for 15 yrs smoked for 15 started again 1 yr     Allergies   Imitrex [sumatriptan]; Sulfonamide derivatives; Reglan [metoclopramide];  Zofran [ondansetron hcl]; and Ondansetron   Review of Systems Review of Systems  All other  systems reviewed and are negative.    Physical Exam Updated Vital Signs BP 116/89 (BP Location: Left Arm)   Pulse 93   Temp 98.5 F (36.9 C) (Oral)   Resp 20   Ht 5\' 4"  (1.626 m)   Wt 97.5 kg (215 lb)   LMP 04/11/2012   SpO2 99%   BMI 36.90 kg/m   Physical Exam  Constitutional: She appears well-developed and well-nourished.  HENT:  Head: Normocephalic and atraumatic.  Right Ear: Hearing and external ear normal. There is swelling and tenderness. No drainage. Tympanic membrane is erythematous.  Left Ear: Hearing, tympanic membrane, external ear and ear canal normal.  Nose: Mucosal edema present. Right sinus exhibits maxillary sinus tenderness. Left sinus exhibits maxillary sinus tenderness.  Mouth/Throat: Uvula is midline, oropharynx is clear and moist and mucous membranes are normal. No tonsillar exudate.  No mastoid erythema, edema.  There is mild tenderness over the right mastoid.  There is no protrusion of the auricle or obliteration of the postauricular crease. The patient has normal phonation and is in control of secretions. No stridor.  Midline uvula without edema. Soft palate rises symmetrically.  No tonsillar erythema or exudates. No PTA. Tongue protrusion is normal. No trismus. No creptius on neck palpation and patient has fair dentition. No dental pain on percussion. No gingival erythema or fluctuance noted. Mucus membranes moist.  No TMJ crepitus.  Eyes: Pupils are equal, round, and reactive to light. Right eye exhibits no discharge. Left eye exhibits no discharge. No scleral icterus.  Neck: Trachea normal. Neck supple. No JVD present. No spinous process tenderness present. Carotid bruit is not present. No neck rigidity. Normal range of motion present.  No nuchal rigidity or meningismus  Cardiovascular: Normal rate, regular rhythm and intact distal pulses.  No murmur heard. Pulses:      Radial pulses are 2+ on the right side, and 2+ on the left side.       Dorsalis pedis  pulses are 2+ on the right side, and 2+ on the left side.       Posterior tibial pulses are 2+ on the right side, and 2+ on the left side.  No lower extremity swelling or edema. Calves symmetric in size bilaterally.  Pulmonary/Chest: Effort normal and breath sounds normal. She exhibits no tenderness.  Abdominal: Soft. Bowel sounds are normal. There is no tenderness. There is no rebound and no guarding.  Musculoskeletal: She exhibits no edema.  Lymphadenopathy:    She has no cervical adenopathy.  Neurological: She is alert.  Speech clear. Follows commands. No facial droop. PERRLA. EOM grossly intact. CN III-XII grossly intact. Grossly moves all extremities 4 without ataxia. Able and appropriate strength for age to upper and lower extremities bilaterally including grip strength.   Skin: Skin is warm, dry and intact. Capillary refill takes less than 2 seconds. No rash noted. She is not diaphoretic.  No vesicular-like rash on the face.  Psychiatric: She has a normal mood and affect.  Nursing note and vitals reviewed.    ED Treatments / Results  Labs (all labs ordered are listed, but only abnormal results are displayed) Labs Reviewed  CBC  BASIC METABOLIC PANEL    EKG  EKG Interpretation None       Radiology Ct Head Wo Contrast  Result Date: 12/22/2017 CLINICAL DATA:  Altered mental status EXAM: CT HEAD WITHOUT CONTRAST TECHNIQUE: Contiguous axial images  were obtained from the base of the skull through the vertex without intravenous contrast. COMPARISON:  None. FINDINGS: Brain: No mass lesion, intraparenchymal hemorrhage or extra-axial collection. No evidence of acute cortical infarct. Normal appearance of the brain parenchyma and extra axial spaces for age. Vascular: No hyperdense vessel or unexpected vascular calcification. Skull: Normal visualized skull base, calvarium and extracranial soft tissues. Sinuses/Orbits: No sinus fluid levels or advanced mucosal thickening. No mastoid  effusion. Normal orbits. IMPRESSION: Normal head CT. Electronically Signed   By: Ulyses Jarred M.D.   On: 12/22/2017 18:43   Ct Temporal Bones W Contrast  Result Date: 12/22/2017 CLINICAL DATA:  Right ear pain. Right dental pain. Concern for mastoiditis. EXAM: CT TEMPORAL BONES WITH CONTRAST TECHNIQUE: Axial and coronal plane CT imaging of the petrous temporal bones was performed with thin-collimation image reconstruction after intravenous contrast administration. Multiplanar CT image reconstructions were also generated. CONTRAST:  45mL ISOVUE-300 IOPAMIDOL (ISOVUE-300) INJECTION 61% COMPARISON:  None. FINDINGS: RIGHT: --Pinna and external auditory canal: Normal. --Ossicular chain: Normal. No erosion or dislocation. --Tympanic membrane: Normal. --Middle ear: Normal. --Epitympanum: The Prussak space is clear. The scutum is sharp. Tegmen tympani is intact. --Cochlea, vestibule, vestibular aqueduct and semicircular canals: Normal. No evidence of canal dehiscence or otospongiosis. --Internal auditory canal: Normal. No widening of the porus acusticus. --Facial nerve: No focal abnormality along the course of the facial nerve. --Cerebellopontine angle: Normal. --Petrous Apex: Normal. --Mastoids: Normal. --Carotid canal: Normal position. LEFT: --Pinna and external auditory canal: Normal. --Ossicular chain: Normal. No erosion or dislocation. --Tympanic membrane: Normal. --Middle ear: Normal. --Epitympanum: The Prussak space is clear. The scutum is sharp. Tegmen tympani is intact. --Cochlea, vestibule, vestibular aqueduct and semicircular canals: Normal. No evidence of canal dehiscence or otospongiosis. --Internal auditory canal: Normal. No widening of the porus acusticus. --Facial nerve: No focal abnormality along the course of the facial nerve. --Cerebellopontine angle: Normal. --Petrous Apex: Normal. --Mastoids: Normal. --Carotid canal: Normal position. OTHER: --Visualized intracranial: Normal. --Visualized paranasal  sinuses: Normal. --Nasopharynx: Clear. --Temporomandibular joints: Normal. --Visualized extracranial soft tissues: Normal. IMPRESSION: Normal CT of the temporal bones. No imaging evidence of otomastoiditis. Electronically Signed   By: Ulyses Jarred M.D.   On: 12/22/2017 18:41    Procedures Procedures (including critical care time)  Medications Ordered in ED Medications  sodium chloride 0.9 % injection (not administered)  ibuprofen (ADVIL,MOTRIN) tablet 400 mg (400 mg Oral Given 12/22/17 1638)  iopamidol (ISOVUE-300) 61 % injection (75 mLs  Contrast Given 12/22/17 1813)     Initial Impression / Assessment and Plan / ED Course  I have reviewed the triage vital signs and the nursing notes.  Pertinent labs & imaging results that were available during my care of the patient were reviewed by me and considered in my medical decision making (see chart for details).     52 year old female with right ear otalgia that began shortly after started URI symptoms 2 days ago.  Was seen at urgent care yesterday and diagnosed with acute otitis media discharged home on Omnicef.  PCP was concerned for mastoiditis so she was sent over here for CT imaging.  On exam the patient is noted to have swollen, edematous ear canal with erythematous TM that is intact.  Patient does have some mild tenderness of the right mastoid without edema or erythema.  There is no protrusion of the auricle or obliteration of the postauricular crease.  No cranial nerve palsies.  Patient without vesicular-like rash or pain that follows a dermatome.  Do not suspect  shingles.  Her blood work is reassuring.  No leukocytosis.  Otherwise patient blood work is benign.  CT scans with no evidence of mastoiditis.  Normal CT of the temporal bones and head.  Will treat the patient with oral analgesics, advised continuing antibiotics and prescribed ear drops. Will also give steroid burst. The evaluation does not show pathology that would require ongoing  emergent intervention or inpatient treatment. I advised the patient to follow-up with PCP this week. I advised the patient to return to the emergency department with new or worsening symptoms or new concerns. Specific return precautions discussed. The patient verbalized understanding and agreement with plan. All questions answered. No further questions at this time. The patient is hemodynamically stable, mentating appropriately and appears safe for discharge.  Final Clinical Impressions(s) / ED Diagnoses   Final diagnoses:  Right ear pain    ED Discharge Orders        Ordered    ciprofloxacin-dexamethasone (CIPRODEX) OTIC suspension  2 times daily     12/22/17 2119    meloxicam (MOBIC) 15 MG tablet  Daily     12/22/17 2119    predniSONE (DELTASONE) 20 MG tablet  Daily with breakfast     12/22/17 2119    fluticasone (FLONASE) 50 MCG/ACT nasal spray  Daily     12/22/17 2121       Lorelle Gibbs 12/22/17 2121    Macarthur Critchley, MD 12/28/17 0230

## 2017-12-22 NOTE — ED Notes (Signed)
Patient transported to CT 

## 2017-12-22 NOTE — Progress Notes (Signed)
Tamara Cross is a 52 y.o. female with the following history as recorded in Epic[employer]:  Patient Active Problem List   Diagnosis Date Noted  . HNP (herniated nucleus pulposus), lumbar 11/14/2017  . Sciatic leg pain 10/21/2017  . Sinusitis, chronic 10/15/2016  . Asthmatic bronchitis with acute exacerbation 04/03/2016  . Acute URI 12/20/2015  . Ataxia 05/30/2015  . Liver lesion, left lobe 01/03/2015  . Gallstone pancreatitis 11/28/2014  . Dysphagia, pharyngoesophageal phase 10/06/2014  . Special screening for malignant neoplasm of colon 10/06/2014  . Dysphagia   . Special screening for malignant neoplasms, colon   . Arthralgia 09/12/2014  . Submuscular lipoma of chest 09/12/2014  . Leg pain 08/29/2014  . Hot flashes due to tamoxifen 08/29/2014  . LBP (low back pain) 04/26/2014  . Rash and nonspecific skin eruption 04/26/2014  . 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 Palpitations 11/18/2013  . Allergy   . Heart murmur   . HIP PAIN 09/07/2010  . PEDAL EDEMA 04/26/2010  . EXTERNAL HEMORRHOIDS 01/24/2010  . HIATAL HERNIA 01/24/2010  . Reflux esophagitis 12/19/2009  . GERD 12/19/2009  . Irritable bowel syndrome 12/19/2009  . 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  . Migraine 08/23/2008  . ESOPHAGEAL STRICTURE 03/11/2008  . POLYARTHRALGIA 03/11/2008  . Obesity 05/01/2007  . ANXIETY 05/01/2007  . Chronic depressive disorder 05/01/2007  . PREMATURE VENTRICULAR CONTRACTIONS 05/01/2007  . Allergic rhinitis 05/01/2007  . MITRAL VALVE PROLAPSE, HX OF 05/01/2007  . PUD, HX OF 05/01/2007    Current Outpatient Medications  Medication Sig Dispense Refill  . acetaminophen (TYLENOL) 500 MG tablet Take 1,000 mg by mouth daily as needed for mild pain.    [name] albuterol (PROVENTIL HFA;VENTOLIN HFA) 108 (90 Base) MCG/ACT inhaler Inhale 1-2 puffs into the lungs every 6 (six) hours as needed for wheezing or shortness of breath. (Patient taking differently: Inhale 2  puffs into the lungs every 6 (six) hours as needed for wheezing or shortness of breath. ) 1 Inhaler 0  . b complex vitamins tablet Take 1 tablet by mouth daily.    . budesonide-formoterol (SYMBICORT) 160-4.5 MCG/ACT inhaler Inhale 2 puffs into the lungs 2 (two) times daily. (Patient taking differently: Inhale 2 puffs into the lungs 2 (two) times daily as needed (wheezing). ) 1 Inhaler 6  . cefdinir (OMNICEF) 300 MG capsule Take 1 capsule (300 mg total) by mouth 2 (two) times daily. 14 capsule 0  . cetirizine (ZYRTEC) 10 MG tablet Take 10 mg by mouth daily.    . Cholecalciferol (VITAMIN D3) 50000 units CAPS Take 1 capsule by mouth once a week. 6 capsule 0  . desvenlafaxine (PRISTIQ) 100 MG 24 hr tablet Take 100 mg by mouth daily.      [name] gabapentin (NEURONTIN) 100 MG capsule Take 1-2 capsules (100-200 mg total) by mouth 2 (two) times daily. (Patient not taking: Reported on 12/22/2017) 120 capsule 3  . ibuprofen (ADVIL,MOTRIN) 200 MG tablet Take 400 mg by mouth every 8 (eight) hours as needed for headache or mild pain.    [name] ibuprofen (ADVIL,MOTRIN) 600 MG tablet Take 1 tablet (600 mg total) by mouth every 6 (six) hours as needed. (Patient taking differently: Take 600 mg by mouth every 6 (six) hours as needed for moderate pain. ) 30 tablet 0  . metoprolol succinate (TOPROL-XL) 50 MG 24 hr tablet Take 1 tablet (50 mg total) by mouth daily. Patient needs office visit before refills will be given (Patient not taking: Reported on 12/22/2017)  30 tablet 0  . metoprolol succinate (TOPROL-XL) 50 MG 24 hr tablet Take 1 tablet (50 mg total) by mouth daily. Take with or immediately following a meal. 30 tablet 0  . montelukast (SINGULAIR) 10 MG tablet Take 1 tablet (10 mg total) by mouth daily. 90 tablet 3  . oxyCODONE (OXY IR/ROXICODONE) 5 MG immediate release tablet Take 1 tablet (5 mg total) by mouth every 4 (four) hours as needed for moderate pain ((score 4 to 6)). (Patient not taking: Reported on 12/22/2017) 30  tablet 0  . predniSONE (DELTASONE) 10 MG tablet Prednisone 10 mg: take 4 tabs a day x 3 days; then 3 tabs a day x 4 days; then 2 tabs a day x 4 days, then 1 tab a day x 6 days, then stop. Take pc. (Patient not taking: Reported on 12/22/2017) 38 tablet 1  . valACYclovir (VALTREX) 500 MG tablet Take 500 mg by mouth daily as needed (for breakouts). Reported on 12/20/2015    . VYVANSE 50 MG capsule Take 50 mg by mouth daily.  0  . ciprofloxacin-dexamethasone (CIPRODEX) OTIC suspension Place 4 drops into the right ear 2 (two) times daily. 7.5 mL 0  . fluticasone (FLONASE) 50 MCG/ACT nasal spray Place 2 sprays into both nostrils daily. 16 g 0  . meloxicam (MOBIC) 15 MG tablet Take 1 tablet (15 mg total) by mouth daily. 30 tablet 0  . predniSONE (DELTASONE) 20 MG tablet Take 2 tablets (40 mg total) by mouth daily with breakfast for 5 days. 10 tablet 0  . pseudoephedrine (SUDAFED) 30 MG tablet Take 60-120 mg by mouth daily as needed for congestion (pain).     No current facility-administered medications for this visit.    Facility-Administered Medications Ordered in Other Visits  Medication Dose Route Frequency Provider Last Rate Last Dose  . gadopentetate dimeglumine (MAGNEVIST) injection 18 mL  18 mL Intravenous Once PRN Penumalli, Earlean Polka, MD        Allergies: Imitrex [sumatriptan]; Sulfonamide derivatives; Reglan [metoclopramide]; Zofran [ondansetron hcl]; and Ondansetron  Past Medical History:  Diagnosis Date  . Allergy    chroni rhinitis  . Anxiety   . Arthritis   . 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 Complication of anesthesia    last 2 surgeries had some tachycardia  . Depression   . Dysrhythmia    tachy occ  . Esophageal stricture   . GERD (gastroesophageal reflux disease)   . H/O hiatal hernia   . Heart murmur    MVP  . IBS (irritable bowel syndrome)   . Migraine   . MVP (mitral valve prolapse)   . Osteoarthritis   . Peptic  ulcer disease   . Pneumonia    hx  . PONV (postoperative nausea and vomiting)   . S/P radiation therapy  06/28/2014-08/08/2014    1) Right Breast / 50 Gy in 25 fractions/ 2) Right Breast Boost / 10 Gy in 5 fractions  . Skin cancer    basal cell     Past Surgical History:  Procedure Laterality Date  . APPENDECTOMY    . BREAST LUMPECTOMY Bilateral   . BREAST LUMPECTOMY WITH NEEDLE LOCALIZATION Bilateral 05/02/2014   Procedure: BILATERIAL BREAST LUMPECTOMY WITH NEEDLE LOCALIZATION;  Surgeon: Shann Medal, MD;  Location: [facility];  Service: General;  Laterality: Bilateral;  . CERVIX LESION DESTRUCTION    . CESAREAN SECTION  2002  . CHOLECYSTECTOMY  11/29/2014   lap chole   .  CHOLECYSTECTOMY N/A 11/29/2014   Procedure: LAPAROSCOPIC CHOLECYSTECTOMY WITH INTRAOPERATIVE CHOLANGIOGRAM;  Surgeon: Doreen Salvage, MD;  Location: [facility];  Service: General;  Laterality: N/A;  . COLONOSCOPY N/A 10/06/2014   Procedure: COLONOSCOPY;  Surgeon: Lafayette Dragon, MD;  Location: [facility];  Service: Endoscopy;  Laterality: N/A;  . COLPOSCOPY    . CYSTECTOMY     left breast,in 20's  . DILATION AND CURETTAGE OF UTERUS     x2  . ESOPHAGOGASTRODUODENOSCOPY N/A 10/06/2014   Procedure: ESOPHAGOGASTRODUODENOSCOPY (EGD);  Surgeon: Lafayette Dragon, MD;  Location: [facility];  Service: Endoscopy;  Laterality: N/A;  . HAND SURGERY     cancer removed basal cell  . LUMBAR LAMINECTOMY/DECOMPRESSION MICRODISCECTOMY Left 11/14/2017   Procedure: DISCECTOMY LUMBAR Five - SACRAL - one, LEFT;  Surgeon: Ashok Pall, MD;  Location: [facility];  Service: Neurosurgery;  Laterality: Left;  MICRODISCECTOMY LUMBAR 5- SACRAL 1, LEFT  . NOVASURE ABLATION    . SHOULDER SURGERY Left    debridement ,bone spurs  . TONSILLECTOMY      Family History  Problem Relation Age of Onset  . Heart disease Father   . Hypertension Father   . Colon cancer Maternal Grandmother   . Depression Brother   . Diabetes Maternal Grandfather   . Heart disease  Maternal Grandfather   . Colon cancer Maternal Uncle        x 2  . Colon polyps Unknown        x 9 maternal uncles/aunts  . Breast cancer Neg Hx     Social History   Tobacco Use  . Smoking status: Current Every Day Smoker    Packs/day: 1.00    Years: 10.00    Pack years: 10.00    Types: Cigarettes    Start date: 02/23/1998  . Smokeless tobacco: Never Used  Substance Use Topics  . Alcohol use: Yes    Alcohol/week: 3.0 oz    Types: 5 Glasses of wine per week    Comment: socially    Subjective:  Patient presents with 3 day history of right ear swelling/ pain; started with pain in right side of neck and progressively got worse; notes that pain is so severe Oxycontin is not touching the pain; feels like cannot open and close jaw completely; feels like pain started behind her ear and has progressively worsened; was seen at [facility] yesterday and was started on Omnicef for presumed sinus/ ear infection; she notes she actually feels much worse since being on the antibiotic. Is able to move her neck completely and denies being light sensitive.    Objective:  Vitals:   12/22/17 1455  BP: 140/82  Pulse: 96  Temp: 98.4 F (36.9 C)  TempSrc: Oral  SpO2: 96%  Weight: 217 lb 0.6 oz (98.4 kg)  Height: 5\' 4"  (1.626 m)    General: Well developed, well nourished, in mild distress  Skin : Warm and dry.  Head: Normocephalic and atraumatic  Eyes: Sclera and conjunctiva clear; pupils round and reactive to light; extraocular movements intact  Ears: Entire right ear lobe is swollen and erythematous/ painful to touch/ movement; canal is swollen- patient experiences marked pain with attempt with otoscope- TM appears normal; mildly tender over right mastoid bone; Oropharynx: Pink, supple. No suspicious lesions  Neck: Supple without thyromegaly, adenopathy  Lungs: Respirations unlabored; clear to auscultation bilaterally without wheeze, rales, rhonchi  CVS exam: normal rate and regular rhythm.   Neurologic: Alert and oriented; speech intact; face symmetrical; moves all extremities  well; CNII-XII intact without focal deficit   Assessment:  1. Right ear pain     Plan:  Based on presentation, severe pain that patient is experiencing and tenderness over mastoid bone, am concerned for atypical presentation of mastoiditis; explained to patient that CT is recommended and warranted; however, due to late time of appointment, feel ER evaluation appropriate; she is in agreement; ER notified. Follow-up to be determined.   No Follow-up on file.  No orders of the defined types were placed in this encounter.   Requested Prescriptions    No prescriptions requested or ordered in this encounter

## 2017-12-22 NOTE — Discharge Instructions (Signed)
Take medications as prescribed.  Follow up with PCP in 2 days.  If you develop worsening or new concerning symptoms you can return to the emergency department for re-evaluation.

## 2017-12-22 NOTE — ED Triage Notes (Signed)
Patient sent by PCP to rule out infection in bone behind right ear. Pt was seen at [facility] yesterday and is not happy about care by doctor. Reports pain is becoming more severe. Having right sided dental pain that developed today. Pt was sent for IV antibiotics and ct scan.

## 2018-01-09 ENCOUNTER — Telehealth: Payer: Self-pay | Admitting: Internal Medicine

## 2018-01-09 MED ORDER — METOPROLOL SUCCINATE ER 50 MG PO TB24: 50.0000 mg | ORAL_TABLET | Freq: Every day | ORAL | 0 refills | Status: DC

## 2018-01-09 NOTE — Telephone Encounter (Signed)
Pt requesting 30 day refill of Metooprolol (Toprol-XL) 50mg  24hr tablet until seen by Cardiologist on 01/14/18. Medication was not previously prescribed by this provider. Pt requesting 30 day supple due to cost.   LOV: 10/21/17  Dr. Alain Marion  [facility]    [place]

## 2018-01-09 NOTE — Telephone Encounter (Signed)
Pt called stating she has has irregular heartbeats all week , has made a re-est appt from 2015 , and is requesting refill of Metoprolol that she stopped taking because the last refill was denied due to needing an appt-she is scheduled for 01-19-18 with Caryl Comes at 3:30pm

## 2018-01-09 NOTE — Telephone Encounter (Signed)
Sorry this was to be sent to [facility]

## 2018-01-09 NOTE — Telephone Encounter (Signed)
Copied from [place] [id number]. Topic: Quick Communication - Rx Refill/Question >> Jan 09, 2018 12:51 PM Robina Ade, Helene Kelp D wrote: Medication:  metoprolol succinate (TOPROL-XL) 50 MG 24 hr tablet  Has the patient contacted their pharmacy? Yes, patient needs enough to last her until she sees cardiology on 01/14/18. She would like a 30 day supply due to cost. (Agent: If no, request that the patient contact the pharmacy for the refill.) Preferred Pharmacy (with phone number or street name): [id number] - [place], [place] Agent: Please be advised that RX refills may take up to 3 business days. We ask that you follow-up with your pharmacy.

## 2018-01-09 NOTE — Telephone Encounter (Signed)
Pt is up-to-date- with Dr. Alain Marion will send 30 day script only until appt w/cardiology

## 2018-01-09 NOTE — Telephone Encounter (Signed)
Pt is requesting a refill on metoprolol, pt has not been seen since 2015, pt has an appt on 01/19/18 with Dr. Caryl Comes, would Dr. Caryl Comes like to refill this medication up until pt's appt. Please address

## 2018-01-09 NOTE — Telephone Encounter (Signed)
LM with pts VM regarding refill on Toprol XL. I referred her back to her PCP, who has been filling this medication most recently. Dr Caryl Comes, nor any [facility] provider, has not seen her since November of 2015. She will need to be reestablished before we can give her a refill.

## 2018-01-13 ENCOUNTER — Encounter: Payer: Self-pay | Admitting: Internal Medicine

## 2018-01-19 ENCOUNTER — Ambulatory Visit: Payer: 59 | Admitting: Internal Medicine
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
## 2018-01-20 ENCOUNTER — Encounter: Payer: Self-pay | Admitting: Internal Medicine

## 2018-01-22 DIAGNOSIS — M5126 Other intervertebral disc displacement, lumbar region: Secondary | ICD-10-CM | POA: Diagnosis not present

## 2018-01-27 ENCOUNTER — Other Ambulatory Visit: Payer: Self-pay | Admitting: Neurosurgery

## 2018-01-29 ENCOUNTER — Encounter (HOSPITAL_COMMUNITY): Payer: Self-pay | Admitting: *Deleted

## 2018-01-29 ENCOUNTER — Other Ambulatory Visit: Payer: Self-pay

## 2018-01-29 ENCOUNTER — Other Ambulatory Visit: Payer: Self-pay | Admitting: Neurosurgery

## 2018-01-29 NOTE — Progress Notes (Signed)
Spoke with pt for pre-op all. Pt has hx of MVP and tachycardia. States she hasn't had any problems with the MVP and she states the tachycardia comes and goes. Pt states is allergic to Zofran and when she was here in February a nurse gave it to her IV along with her pain med. She states Zofran causes her to go into a tachy arrhythmia. Pt states she broke out in a rash in February after using the CHG wipes. Pt instructed to shower with Dial soap in the AM. Pt voiced understanding.

## 2018-01-30 ENCOUNTER — Encounter (HOSPITAL_COMMUNITY)
Admission: RE | Disposition: A | Discharge status: Home / Self Care | Payer: Self-pay | Source: Ambulatory Visit | Attending: Neurosurgery

## 2018-01-30 ENCOUNTER — Ambulatory Visit (HOSPITAL_COMMUNITY)
Admission: RE | Admit: 2018-01-30 | Discharge: 2018-01-31 | Disposition: A | Discharge status: Home / Self Care | Payer: 59 | Source: Ambulatory Visit | Attending: Neurosurgery | Admitting: Neurosurgery

## 2018-01-30 ENCOUNTER — Encounter (HOSPITAL_COMMUNITY): Payer: Self-pay | Admitting: *Deleted

## 2018-01-30 ENCOUNTER — Ambulatory Visit (HOSPITAL_COMMUNITY): Payer: 59

## 2018-01-30 DIAGNOSIS — Z800 Family history of malignant neoplasm of digestive organs: Secondary | ICD-10-CM | POA: Insufficient documentation

## 2018-01-30 DIAGNOSIS — I739 Peripheral vascular disease, unspecified: Secondary | ICD-10-CM | POA: Insufficient documentation

## 2018-01-30 DIAGNOSIS — Z8371 Family history of colonic polyps: Secondary | ICD-10-CM | POA: Insufficient documentation

## 2018-01-30 DIAGNOSIS — M5126 Other intervertebral disc displacement, lumbar region: Secondary | ICD-10-CM | POA: Diagnosis not present

## 2018-01-30 DIAGNOSIS — Z888 Allergy status to other drugs, medicaments and biological substances status: Secondary | ICD-10-CM | POA: Insufficient documentation

## 2018-01-30 DIAGNOSIS — M5116 Intervertebral disc disorders with radiculopathy, lumbar region: Secondary | ICD-10-CM | POA: Diagnosis not present

## 2018-01-30 DIAGNOSIS — J45909 Unspecified asthma, uncomplicated: Secondary | ICD-10-CM | POA: Diagnosis not present

## 2018-01-30 DIAGNOSIS — J310 Chronic rhinitis: Secondary | ICD-10-CM | POA: Insufficient documentation

## 2018-01-30 DIAGNOSIS — F329 Major depressive disorder, single episode, unspecified: Secondary | ICD-10-CM | POA: Insufficient documentation

## 2018-01-30 DIAGNOSIS — K449 Diaphragmatic hernia without obstruction or gangrene: Secondary | ICD-10-CM | POA: Diagnosis not present

## 2018-01-30 DIAGNOSIS — Z833 Family history of diabetes mellitus: Secondary | ICD-10-CM | POA: Diagnosis not present

## 2018-01-30 DIAGNOSIS — Z6836 Body mass index (BMI) 36.0-36.9, adult: Secondary | ICD-10-CM | POA: Diagnosis not present

## 2018-01-30 DIAGNOSIS — D509 Iron deficiency anemia, unspecified: Secondary | ICD-10-CM | POA: Insufficient documentation

## 2018-01-30 DIAGNOSIS — Z8711 Personal history of peptic ulcer disease: Secondary | ICD-10-CM | POA: Insufficient documentation

## 2018-01-30 DIAGNOSIS — Z9889 Other specified postprocedural states: Secondary | ICD-10-CM | POA: Diagnosis not present

## 2018-01-30 DIAGNOSIS — E669 Obesity, unspecified: Secondary | ICD-10-CM | POA: Insufficient documentation

## 2018-01-30 DIAGNOSIS — J309 Allergic rhinitis, unspecified: Secondary | ICD-10-CM | POA: Diagnosis not present

## 2018-01-30 DIAGNOSIS — Z923 Personal history of irradiation: Secondary | ICD-10-CM | POA: Diagnosis not present

## 2018-01-30 DIAGNOSIS — Z8249 Family history of ischemic heart disease and other diseases of the circulatory system: Secondary | ICD-10-CM | POA: Insufficient documentation

## 2018-01-30 DIAGNOSIS — I341 Nonrheumatic mitral (valve) prolapse: Secondary | ICD-10-CM | POA: Diagnosis not present

## 2018-01-30 DIAGNOSIS — F419 Anxiety disorder, unspecified: Secondary | ICD-10-CM | POA: Insufficient documentation

## 2018-01-30 DIAGNOSIS — M5127 Other intervertebral disc displacement, lumbosacral region: Secondary | ICD-10-CM

## 2018-01-30 DIAGNOSIS — I472 Ventricular tachycardia: Secondary | ICD-10-CM | POA: Insufficient documentation

## 2018-01-30 DIAGNOSIS — Z9049 Acquired absence of other specified parts of digestive tract: Secondary | ICD-10-CM | POA: Insufficient documentation

## 2018-01-30 DIAGNOSIS — K219 Gastro-esophageal reflux disease without esophagitis: Secondary | ICD-10-CM | POA: Diagnosis not present

## 2018-01-30 DIAGNOSIS — M1990 Unspecified osteoarthritis, unspecified site: Secondary | ICD-10-CM | POA: Insufficient documentation

## 2018-01-30 DIAGNOSIS — M5117 Intervertebral disc disorders with radiculopathy, lumbosacral region: Secondary | ICD-10-CM | POA: Diagnosis present

## 2018-01-30 DIAGNOSIS — Z853 Personal history of malignant neoplasm of breast: Secondary | ICD-10-CM | POA: Diagnosis not present

## 2018-01-30 DIAGNOSIS — F17210 Nicotine dependence, cigarettes, uncomplicated: Secondary | ICD-10-CM | POA: Insufficient documentation

## 2018-01-30 DIAGNOSIS — Z981 Arthrodesis status: Secondary | ICD-10-CM | POA: Diagnosis not present

## 2018-01-30 DIAGNOSIS — K589 Irritable bowel syndrome without diarrhea: Secondary | ICD-10-CM | POA: Diagnosis not present

## 2018-01-30 DIAGNOSIS — K279 Peptic ulcer, site unspecified, unspecified as acute or chronic, without hemorrhage or perforation: Secondary | ICD-10-CM | POA: Insufficient documentation

## 2018-01-30 DIAGNOSIS — Z882 Allergy status to sulfonamides status: Secondary | ICD-10-CM | POA: Insufficient documentation

## 2018-01-30 HISTORY — DX: Anemia, unspecified: D64.9

## 2018-01-30 HISTORY — PX: LUMBAR LAMINECTOMY/DECOMPRESSION MICRODISCECTOMY: SHX5026

## 2018-01-30 LAB — CBC
HCT: 41.7 % (ref 36.0–46.0)
Hemoglobin: 13.6 g/dL (ref 12.0–15.0)
MCH: 29.7 pg (ref 26.0–34.0)
MCHC: 32.6 g/dL (ref 30.0–36.0)
MCV: 91 fL (ref 78.0–100.0)
PLATELETS: 241 10*3/uL (ref 150–400)
RBC: 4.58 MIL/uL (ref 3.87–5.11)
RDW: 12.6 % (ref 11.5–15.5)
WBC: 6.9 10*3/uL (ref 4.0–10.5)

## 2018-01-30 LAB — BASIC METABOLIC PANEL
Anion gap: 11 (ref 5–15)
BUN: 7 mg/dL (ref 6–20)
CO2: 25 mmol/L (ref 22–32)
CREATININE: 0.57 mg/dL (ref 0.44–1.00)
Calcium: 9 mg/dL (ref 8.9–10.3)
Chloride: 104 mmol/L (ref 101–111)
GFR calc Af Amer: >60 mL/min (ref >60)
GLUCOSE: 104 mg/dL — AB (ref 65–99)
POTASSIUM: 3.5 mmol/L (ref 3.5–5.1)
SODIUM: 140 mmol/L (ref 135–145)

## 2018-01-30 LAB — SURGICAL PCR SCREEN
MRSA, PCR: NEGATIVE
Staphylococcus aureus: POSITIVE — AB

## 2018-01-30 LAB — HCG, SERUM, QUALITATIVE: Preg, Serum: NEGATIVE

## 2018-01-30 SURGERY — LUMBAR LAMINECTOMY/DECOMPRESSION MICRODISCECTOMY 1 LEVEL
Anesthesia: General | Laterality: Left

## 2018-01-30 MED ORDER — DOCUSATE SODIUM 100 MG PO CAPS
100.0000 mg | ORAL_CAPSULE | Freq: Two times a day (BID) | ORAL | Status: DC
  Administered 2018-01-30: 100 mg via ORAL
  Filled 2018-01-30: qty 1

## 2018-01-30 MED ORDER — OXYCODONE HCL 5 MG PO TABS
10.0000 mg | ORAL_TABLET | ORAL | Status: DC | PRN
  Administered 2018-01-30 – 2018-01-31 (×4): 10 mg via ORAL
  Filled 2018-01-30 (×3): qty 2

## 2018-01-30 MED ORDER — MIDAZOLAM HCL 2 MG/2ML IJ SOLN
INTRAMUSCULAR | Status: DC | PRN
  Administered 2018-01-30: 2 mg via INTRAVENOUS

## 2018-01-30 MED ORDER — OXYCODONE HCL 5 MG PO TABS: 5.0000 mg | ORAL_TABLET | ORAL | Status: DC | PRN

## 2018-01-30 MED ORDER — GABAPENTIN 300 MG PO CAPS
300.0000 mg | ORAL_CAPSULE | Freq: Three times a day (TID) | ORAL | Status: DC
  Administered 2018-01-30 (×2): 300 mg via ORAL
  Filled 2018-01-30 (×2): qty 1

## 2018-01-30 MED ORDER — CEFAZOLIN SODIUM-DEXTROSE 2-4 GM/100ML-% IV SOLN
INTRAVENOUS | Status: AC
  Filled 2018-01-30: qty 100

## 2018-01-30 MED ORDER — METOPROLOL SUCCINATE ER 50 MG PO TB24: 50.0000 mg | ORAL_TABLET | Freq: Every day | ORAL | Status: DC

## 2018-01-30 MED ORDER — ALBUTEROL SULFATE (2.5 MG/3ML) 0.083% IN NEBU: 3.0000 mL | INHALATION_SOLUTION | Freq: Four times a day (QID) | RESPIRATORY_TRACT | Status: DC | PRN

## 2018-01-30 MED ORDER — ACETAMINOPHEN 650 MG RE SUPP: 650.0000 mg | RECTAL | Status: DC | PRN

## 2018-01-30 MED ORDER — BISACODYL 5 MG PO TBEC: 5.0000 mg | DELAYED_RELEASE_TABLET | Freq: Every day | ORAL | Status: DC | PRN

## 2018-01-30 MED ORDER — MAGNESIUM CITRATE PO SOLN: 1.0000 | Freq: Once | ORAL | Status: DC | PRN

## 2018-01-30 MED ORDER — FENTANYL CITRATE (PF) 100 MCG/2ML IJ SOLN
100.0000 ug | Freq: Once | INTRAMUSCULAR | Status: AC
  Administered 2018-01-30 (×2): 50 ug via INTRAVENOUS

## 2018-01-30 MED ORDER — THROMBIN (RECOMBINANT) 5000 UNITS EX SOLR
CUTANEOUS | Status: DC | PRN
  Administered 2018-01-30 (×2): 5000 [IU] via TOPICAL

## 2018-01-30 MED ORDER — DESVENLAFAXINE SUCCINATE ER 100 MG PO TB24
100.0000 mg | ORAL_TABLET | Freq: Every day | ORAL | Status: DC
  Filled 2018-01-30: qty 1

## 2018-01-30 MED ORDER — FENTANYL CITRATE (PF) 250 MCG/5ML IJ SOLN
INTRAMUSCULAR | Status: AC
Start: 2018-01-30 — End: ?
  Filled 2018-01-30: qty 5

## 2018-01-30 MED ORDER — KETOROLAC TROMETHAMINE 15 MG/ML IJ SOLN
15.0000 mg | Freq: Four times a day (QID) | INTRAMUSCULAR | Status: DC
  Administered 2018-01-30 – 2018-01-31 (×3): 15 mg via INTRAVENOUS
  Filled 2018-01-30 (×3): qty 1

## 2018-01-30 MED ORDER — MUPIROCIN 2 % EX OINT
TOPICAL_OINTMENT | CUTANEOUS | Status: AC
  Administered 2018-01-30: 1
  Filled 2018-01-30: qty 22

## 2018-01-30 MED ORDER — ROCURONIUM BROMIDE 100 MG/10ML IV SOLN
INTRAVENOUS | Status: DC | PRN
  Administered 2018-01-30: 30 mg via INTRAVENOUS
  Administered 2018-01-30: 50 mg via INTRAVENOUS

## 2018-01-30 MED ORDER — SUGAMMADEX SODIUM 200 MG/2ML IV SOLN
INTRAVENOUS | Status: DC | PRN
  Administered 2018-01-30: 200 mg via INTRAVENOUS

## 2018-01-30 MED ORDER — MORPHINE SULFATE (PF) 4 MG/ML IV SOLN
2.0000 mg | INTRAVENOUS | Status: DC | PRN
  Administered 2018-01-30 (×2): 2 mg via INTRAVENOUS
  Filled 2018-01-30 (×2): qty 1

## 2018-01-30 MED ORDER — CHLORHEXIDINE GLUCONATE CLOTH 2 % EX PADS: 6.0000 | MEDICATED_PAD | Freq: Once | CUTANEOUS | Status: DC

## 2018-01-30 MED ORDER — CEFAZOLIN SODIUM-DEXTROSE 2-4 GM/100ML-% IV SOLN: 2.0000 g | INTRAVENOUS | Status: DC

## 2018-01-30 MED ORDER — VALACYCLOVIR HCL 500 MG PO TABS
500.0000 mg | ORAL_TABLET | Freq: Every day | ORAL | Status: DC | PRN
  Filled 2018-01-30: qty 1

## 2018-01-30 MED ORDER — LIDOCAINE 2% (20 MG/ML) 5 ML SYRINGE
INTRAMUSCULAR | Status: DC | PRN
  Administered 2018-01-30: 100 mg via INTRAVENOUS

## 2018-01-30 MED ORDER — PHENYLEPHRINE 40 MCG/ML (10ML) SYRINGE FOR IV PUSH (FOR BLOOD PRESSURE SUPPORT)
PREFILLED_SYRINGE | INTRAVENOUS | Status: DC | PRN
  Administered 2018-01-30 (×3): 40 ug via INTRAVENOUS

## 2018-01-30 MED ORDER — LACTATED RINGERS IV SOLN
INTRAVENOUS | Status: DC
  Administered 2018-01-30: 09:00:00 via INTRAVENOUS

## 2018-01-30 MED ORDER — B COMPLEX-C PO TABS
1.0000 | ORAL_TABLET | Freq: Every day | ORAL | Status: DC
  Filled 2018-01-30: qty 1

## 2018-01-30 MED ORDER — HYDROMORPHONE HCL 2 MG/ML IJ SOLN
0.2500 mg | INTRAMUSCULAR | Status: DC | PRN
  Administered 2018-01-30 (×4): 0.5 mg via INTRAVENOUS

## 2018-01-30 MED ORDER — FENTANYL CITRATE (PF) 250 MCG/5ML IJ SOLN
INTRAMUSCULAR | Status: AC
  Filled 2018-01-30: qty 5

## 2018-01-30 MED ORDER — FLUTICASONE PROPIONATE 50 MCG/ACT NA SUSP
2.0000 | Freq: Every day | NASAL | Status: DC
  Filled 2018-01-30: qty 16

## 2018-01-30 MED ORDER — POTASSIUM CHLORIDE IN NACL 20-0.9 MEQ/L-% IV SOLN: INTRAVENOUS | Status: DC

## 2018-01-30 MED ORDER — ZOLPIDEM TARTRATE 5 MG PO TABS: 5.0000 mg | ORAL_TABLET | Freq: Every evening | ORAL | Status: DC | PRN

## 2018-01-30 MED ORDER — FENTANYL CITRATE (PF) 100 MCG/2ML IJ SOLN
INTRAMUSCULAR | Status: AC
  Administered 2018-01-30: 50 ug via INTRAVENOUS
  Filled 2018-01-30: qty 2

## 2018-01-30 MED ORDER — PROPOFOL 10 MG/ML IV BOLUS
INTRAVENOUS | Status: DC | PRN
  Administered 2018-01-30: 150 mg via INTRAVENOUS

## 2018-01-30 MED ORDER — ACETAMINOPHEN 325 MG PO TABS: 650.0000 mg | ORAL_TABLET | ORAL | Status: DC | PRN

## 2018-01-30 MED ORDER — OXYCODONE HCL 5 MG PO TABS
ORAL_TABLET | ORAL | Status: AC
  Filled 2018-01-30: qty 2

## 2018-01-30 MED ORDER — LIDOCAINE-EPINEPHRINE 0.5 %-1:200000 IJ SOLN
INTRAMUSCULAR | Status: DC | PRN
  Administered 2018-01-30: 10 mL

## 2018-01-30 MED ORDER — PSEUDOEPHEDRINE HCL 60 MG PO TABS
60.0000 mg | ORAL_TABLET | Freq: Every day | ORAL | Status: DC | PRN
  Filled 2018-01-30: qty 1

## 2018-01-30 MED ORDER — LACTATED RINGERS IV SOLN
INTRAVENOUS | Status: DC | PRN
  Administered 2018-01-30: 11:00:00 via INTRAVENOUS

## 2018-01-30 MED ORDER — ONDANSETRON HCL 4 MG PO TABS: 4.0000 mg | ORAL_TABLET | Freq: Four times a day (QID) | ORAL | Status: DC | PRN

## 2018-01-30 MED ORDER — MIDAZOLAM HCL 2 MG/2ML IJ SOLN
INTRAMUSCULAR | Status: AC
  Filled 2018-01-30: qty 2

## 2018-01-30 MED ORDER — OXYCODONE HCL ER 10 MG PO T12A
10.0000 mg | EXTENDED_RELEASE_TABLET | Freq: Two times a day (BID) | ORAL | Status: DC
  Administered 2018-01-30: 10 mg via ORAL
  Filled 2018-01-30: qty 1

## 2018-01-30 MED ORDER — SODIUM CHLORIDE 0.9% FLUSH: 3.0000 mL | INTRAVENOUS | Status: DC | PRN

## 2018-01-30 MED ORDER — ONDANSETRON HCL 4 MG/2ML IJ SOLN: 4.0000 mg | Freq: Four times a day (QID) | INTRAMUSCULAR | Status: DC | PRN

## 2018-01-30 MED ORDER — SODIUM CHLORIDE 0.9% FLUSH
3.0000 mL | Freq: Two times a day (BID) | INTRAVENOUS | Status: DC
  Administered 2018-01-30: 3 mL via INTRAVENOUS

## 2018-01-30 MED ORDER — ACETAMINOPHEN 500 MG PO TABS
1000.0000 mg | ORAL_TABLET | Freq: Four times a day (QID) | ORAL | Status: DC
  Administered 2018-01-30 – 2018-01-31 (×2): 1000 mg via ORAL
  Filled 2018-01-30 (×3): qty 2

## 2018-01-30 MED ORDER — PHENOL 1.4 % MT LIQD: 1.0000 | OROMUCOSAL | Status: DC | PRN

## 2018-01-30 MED ORDER — ONDANSETRON HCL 4 MG/2ML IJ SOLN
INTRAMUSCULAR | Status: DC | PRN
  Administered 2018-01-30: 4 mg via INTRAVENOUS

## 2018-01-30 MED ORDER — THROMBIN 5000 UNITS EX SOLR
CUTANEOUS | Status: AC
  Filled 2018-01-30: qty 10000

## 2018-01-30 MED ORDER — FENTANYL CITRATE (PF) 100 MCG/2ML IJ SOLN
INTRAMUSCULAR | Status: DC | PRN
  Administered 2018-01-30: 50 ug via INTRAVENOUS
  Administered 2018-01-30: 100 ug via INTRAVENOUS
  Administered 2018-01-30 (×3): 50 ug via INTRAVENOUS

## 2018-01-30 MED ORDER — LISDEXAMFETAMINE DIMESYLATE 30 MG PO CAPS: 50.0000 mg | ORAL_CAPSULE | Freq: Every day | ORAL | Status: DC

## 2018-01-30 MED ORDER — MONTELUKAST SODIUM 10 MG PO TABS
10.0000 mg | ORAL_TABLET | Freq: Every day | ORAL | Status: DC
  Filled 2018-01-30: qty 1

## 2018-01-30 MED ORDER — KETOROLAC TROMETHAMINE 30 MG/ML IJ SOLN
INTRAMUSCULAR | Status: AC
  Administered 2018-01-30: 30 mg via INTRAVENOUS
  Filled 2018-01-30: qty 1

## 2018-01-30 MED ORDER — ARTIFICIAL TEARS OPHTHALMIC OINT
TOPICAL_OINTMENT | OPHTHALMIC | Status: DC | PRN
  Administered 2018-01-30: 1 via OPHTHALMIC

## 2018-01-30 MED ORDER — MEPERIDINE HCL 50 MG/ML IJ SOLN: 6.2500 mg | INTRAMUSCULAR | Status: DC | PRN

## 2018-01-30 MED ORDER — HEMOSTATIC AGENTS (NO CHARGE) OPTIME
TOPICAL | Status: DC | PRN
  Administered 2018-01-30: 1 via TOPICAL

## 2018-01-30 MED ORDER — PROPOFOL 10 MG/ML IV BOLUS
INTRAVENOUS | Status: AC
  Filled 2018-01-30: qty 20

## 2018-01-30 MED ORDER — LORATADINE 10 MG PO TABS: 10.0000 mg | ORAL_TABLET | Freq: Every day | ORAL | Status: DC

## 2018-01-30 MED ORDER — SUGAMMADEX SODIUM 200 MG/2ML IV SOLN
INTRAVENOUS | Status: AC
  Filled 2018-01-30: qty 2

## 2018-01-30 MED ORDER — PROMETHAZINE HCL 25 MG/ML IJ SOLN: 6.2500 mg | INTRAMUSCULAR | Status: DC | PRN

## 2018-01-30 MED ORDER — MENTHOL 3 MG MT LOZG: 1.0000 | LOZENGE | OROMUCOSAL | Status: DC | PRN

## 2018-01-30 MED ORDER — KETOROLAC TROMETHAMINE 30 MG/ML IJ SOLN
30.0000 mg | Freq: Once | INTRAMUSCULAR | Status: AC | PRN
  Administered 2018-01-30: 30 mg via INTRAVENOUS

## 2018-01-30 MED ORDER — DIAZEPAM 5 MG PO TABS
5.0000 mg | ORAL_TABLET | Freq: Four times a day (QID) | ORAL | Status: DC | PRN
  Administered 2018-01-30 – 2018-01-31 (×3): 5 mg via ORAL
  Filled 2018-01-30 (×3): qty 1

## 2018-01-30 MED ORDER — DEXAMETHASONE SODIUM PHOSPHATE 10 MG/ML IJ SOLN
INTRAMUSCULAR | Status: DC | PRN
  Administered 2018-01-30: 10 mg via INTRAVENOUS

## 2018-01-30 MED ORDER — SENNOSIDES-DOCUSATE SODIUM 8.6-50 MG PO TABS: 1.0000 | ORAL_TABLET | Freq: Every evening | ORAL | Status: DC | PRN

## 2018-01-30 MED ORDER — LIDOCAINE-EPINEPHRINE 0.5 %-1:200000 IJ SOLN
INTRAMUSCULAR | Status: AC
  Filled 2018-01-30: qty 1

## 2018-01-30 MED ORDER — 0.9 % SODIUM CHLORIDE (POUR BTL) OPTIME
TOPICAL | Status: DC | PRN
  Administered 2018-01-30: 1000 mL

## 2018-01-30 MED ORDER — HYDROMORPHONE HCL 2 MG/ML IJ SOLN
INTRAMUSCULAR | Status: AC
  Administered 2018-01-30: 0.5 mg via INTRAVENOUS
  Filled 2018-01-30: qty 1

## 2018-01-30 SURGICAL SUPPLY — 52 items
ADH SKN CLS APL DERMABOND .7 (GAUZE/BANDAGES/DRESSINGS) ×1
BENZOIN TINCTURE PRP APPL 2/3 (GAUZE/BANDAGES/DRESSINGS) IMPLANT
BLADE CLIPPER SURG (BLADE) IMPLANT
BUR MATCHSTICK NEURO 3.0 LAGG (BURR) ×3 IMPLANT
BUR PRECISION FLUTE 5.0 (BURR) IMPLANT
CANISTER SUCT 3000ML PPV (MISCELLANEOUS) ×3 IMPLANT
CARTRIDGE OIL MAESTRO DRILL (MISCELLANEOUS) ×1 IMPLANT
CLOSURE WOUND 1/2 X4 (GAUZE/BANDAGES/DRESSINGS)
DECANTER SPIKE VIAL GLASS SM (MISCELLANEOUS) ×3 IMPLANT
DERMABOND ADVANCED (GAUZE/BANDAGES/DRESSINGS) ×2
DERMABOND ADVANCED .7 DNX12 (GAUZE/BANDAGES/DRESSINGS) ×1 IMPLANT
DIFFUSER DRILL AIR PNEUMATIC (MISCELLANEOUS) ×3 IMPLANT
DRAPE HALF SHEET 40X57 (DRAPES) ×3 IMPLANT
DRAPE LAPAROTOMY 100X72X124 (DRAPES) ×3 IMPLANT
DRAPE MICROSCOPE LEICA (MISCELLANEOUS) ×3 IMPLANT
DRAPE SURG 17X23 STRL (DRAPES) ×3 IMPLANT
DURAPREP 26ML APPLICATOR (WOUND CARE) ×3 IMPLANT
ELECT BLADE 4.0 EZ CLEAN MEGAD (MISCELLANEOUS) ×3
ELECT REM PT RETURN 9FT ADLT (ELECTROSURGICAL) ×3
ELECTRODE BLDE 4.0 EZ CLN MEGD (MISCELLANEOUS) ×1 IMPLANT
ELECTRODE REM PT RTRN 9FT ADLT (ELECTROSURGICAL) ×1 IMPLANT
GAUZE SPONGE 4X4 12PLY STRL (GAUZE/BANDAGES/DRESSINGS) IMPLANT
GAUZE SPONGE 4X4 16PLY XRAY LF (GAUZE/BANDAGES/DRESSINGS) IMPLANT
GLOVE ECLIPSE 6.5 STRL STRAW (GLOVE) ×3 IMPLANT
GLOVE EXAM NITRILE LRG STRL (GLOVE) IMPLANT
GLOVE EXAM NITRILE XL STR (GLOVE) IMPLANT
GLOVE EXAM NITRILE XS STR PU (GLOVE) IMPLANT
GOWN STRL REUS W/ TWL LRG LVL3 (GOWN DISPOSABLE) ×2 IMPLANT
GOWN STRL REUS W/ TWL XL LVL3 (GOWN DISPOSABLE) IMPLANT
GOWN STRL REUS W/TWL 2XL LVL3 (GOWN DISPOSABLE) IMPLANT
GOWN STRL REUS W/TWL LRG LVL3 (GOWN DISPOSABLE) ×4
GOWN STRL REUS W/TWL XL LVL3 (GOWN DISPOSABLE)
GRAFT DURAGEN MATRIX 1WX1L (Tissue) ×3 IMPLANT
KIT BASIN OR (CUSTOM PROCEDURE TRAY) ×3 IMPLANT
KIT TURNOVER KIT B (KITS) ×3 IMPLANT
NEEDLE HYPO 25X1 1.5 SAFETY (NEEDLE) ×3 IMPLANT
NEEDLE SPNL 18GX3.5 QUINCKE PK (NEEDLE) IMPLANT
NS IRRIG 1000ML POUR BTL (IV SOLUTION) ×3 IMPLANT
OIL CARTRIDGE MAESTRO DRILL (MISCELLANEOUS) ×3
PACK LAMINECTOMY NEURO (CUSTOM PROCEDURE TRAY) ×3 IMPLANT
PAD ARMBOARD 7.5X6 YLW CONV (MISCELLANEOUS) ×9 IMPLANT
RUBBERBAND STERILE (MISCELLANEOUS) ×6 IMPLANT
SPONGE LAP 4X18 X RAY DECT (DISPOSABLE) IMPLANT
SPONGE SURGIFOAM ABS GEL SZ50 (HEMOSTASIS) ×3 IMPLANT
STRIP CLOSURE SKIN 1/2X4 (GAUZE/BANDAGES/DRESSINGS) IMPLANT
SUT VIC AB 0 CT1 18XCR BRD8 (SUTURE) ×1 IMPLANT
SUT VIC AB 0 CT1 8-18 (SUTURE) ×3
SUT VIC AB 2-0 CT1 18 (SUTURE) ×3 IMPLANT
SUT VIC AB 3-0 SH 8-18 (SUTURE) ×3 IMPLANT
TOWEL GREEN STERILE (TOWEL DISPOSABLE) ×3 IMPLANT
TOWEL GREEN STERILE FF (TOWEL DISPOSABLE) ×3 IMPLANT
WATER STERILE IRR 1000ML POUR (IV SOLUTION) ×3 IMPLANT
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
## 2018-01-30 NOTE — Anesthesia Preprocedure Evaluation (Addendum)
Anesthesia Evaluation  Patient identified by MRN, date of birth, ID band Patient awake    Reviewed: Allergy & Precautions, NPO status , Patient's Chart, lab work & pertinent test results  History of Anesthesia Complications (+) PONV  Airway Mallampati: II  TM Distance: >3 FB     Dental no notable dental hx. (+) Teeth Intact   Pulmonary asthma , pneumonia, Current Smoker,    Pulmonary exam normal breath sounds clear to auscultation       Cardiovascular + Peripheral Vascular Disease  Normal cardiovascular exam+ dysrhythmias Supra Ventricular Tachycardia + Valvular Problems/Murmurs  Rhythm:Regular Rate:Normal     Neuro/Psych    GI/Hepatic Neg liver ROS, hiatal hernia, PUD, GERD  ,  Endo/Other  negative endocrine ROS  Renal/GU negative Renal ROS     Musculoskeletal   Abdominal (+) + obese,   Peds  Hematology   Anesthesia Other Findings   Reproductive/Obstetrics                             Anesthesia Physical  Anesthesia Plan  ASA: III  Anesthesia Plan: General   Post-op Pain Management:    Induction: Intravenous  PONV Risk Score and Plan: 3 and Treatment may vary due to age or medical condition, Ondansetron, Dexamethasone and Midazolam  Airway Management Planned: Oral ETT  Additional Equipment:   Intra-op Plan:   Post-operative Plan: Extubation in OR  Informed Consent: I have reviewed the patients History and Physical, chart, labs and discussed the procedure including the risks, benefits and alternatives for the proposed anesthesia with the patient or authorized representative who has indicated his/her understanding and acceptance.   Dental advisory given  Plan Discussed with: CRNA and Surgeon  Anesthesia Plan Comments:         Anesthesia Quick Evaluation

## 2018-01-30 NOTE — Anesthesia Procedure Notes (Signed)
Procedure Name: Intubation Date/Time: 01/30/2018 10:54 AM Performed by: Lyn Hollingshead, MD Pre-anesthesia Checklist: Patient identified, Emergency Drugs available, Suction available and Patient being monitored Patient Re-evaluated:Patient Re-evaluated prior to induction Oxygen Delivery Method: Circle System Utilized Preoxygenation: Pre-oxygenation with 100% oxygen Induction Type: IV induction Ventilation: Mask ventilation without difficulty Laryngoscope Size: Mac and 3 Grade View: Grade I Tube type: Oral Tube size: 7.0 mm Number of attempts: 1 Airway Equipment and Method: Stylet Placement Confirmation: ETT inserted through vocal cords under direct vision,  positive ETCO2 and breath sounds checked- equal and bilateral Secured at: 22 cm Tube secured with: Tape Dental Injury: Teeth and Oropharynx as per pre-operative assessment  Comments: Performed by Sydnee Levans

## 2018-01-30 NOTE — Transfer of Care (Signed)
Immediate Anesthesia Transfer of Care Note  Patient: Tamara Cross  Procedure(s) Performed: MICRODISCECTOMY LUMBAR 4- LUMBAR 5 LEFT (Left )  Patient Location: PACU  Anesthesia Type:General  Level of Consciousness: awake, alert  and oriented  Airway & Oxygen Therapy: Patient Spontanous Breathing and Patient connected to nasal cannula oxygen  Post-op Assessment: Report given to RN, Post -op Vital signs reviewed and stable and Patient moving all extremities  Post vital signs: Reviewed and stable   Last Vitals:  Vitals Value Taken Time  BP 115/68 01/30/2018  1:40 PM  Temp    Pulse 81 01/30/2018  1:42 PM  Resp 17 01/30/2018  1:42 PM  SpO2 97 % 01/30/2018  1:42 PM  Vitals shown include unvalidated device data.  Last Pain:  Vitals:   01/30/18 0828  TempSrc:   PainSc: 7          Complications: No apparent anesthesia complications

## 2018-01-30 NOTE — Op Note (Signed)
01/30/2018  1:34 PM  PATIENT:  Tamara Cross  52 y.o. 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 was taken to the operating room, intubated and placed under a general anesthetic without difficulty. She was positioned prone on a Wilson frame with all pressure points padded. Her back was prepped and draped in a sterile manner. I opened the skin with a 10 blade and carried the dissection down to the thoracolumbar fascia. I used both sharp dissection and the monopolar cautery to expose the lamina of L4, and L5. I confirmed my location with an intraoperative xray.  I used the drill, Kerrison punches, and curettes to perform a semihemilaminectomy of L4. I used the punches to remove the ligamentum flavum to expose the thecal sac. I brought the microscope into the operative field and with Dr.Jenkins' assistance we started our decompression of the spinal canal, thecal sac and L5 root(s). I cauterized epidural veins overlying the disc space then divided them sharply. I opened the disc space with a 15 blade and proceeded with the discectomy. I used pituitary rongeurs, curettes, and other instruments to remove disc material. After the discectomy was completed we inspected the L5 nerve root and felt it was well decompressed. I explored rostrally, laterally, medially, and caudally and was satisfied with the decompression.  I irrigated the wound, then closed in layers. I approximated the thoracolumbar fascia, subcutaneous, and subcuticular planes with vicryl sutures. I used dermabond for a sterile dressing.   PLAN OF CARE: Admit for overnight observation  PATIENT DISPOSITION:  PACU - hemodynamically stable.   Delay start of Pharmacological VTE agent (>24hrs) due to surgical blood loss or risk of bleeding:  yes

## 2018-01-31 DIAGNOSIS — M5116 Intervertebral disc disorders with radiculopathy, lumbar region: Secondary | ICD-10-CM | POA: Diagnosis not present

## 2018-01-31 MED ORDER — OXYCODONE HCL 5 MG PO TABS: 5.0000 mg | ORAL_TABLET | Freq: Four times a day (QID) | ORAL | 0 refills | Status: DC | PRN

## 2018-01-31 NOTE — Progress Notes (Signed)
PT Cancellation Note  Patient Details Name: Tamara Cross MRN: [id number] DOB: 04-05-1966   Cancelled Treatment:    Reason Eval/Treat Not Completed: PT screened, no needs identified, will sign off  Pt ambulating ion hallway, drinking coffee. Reports no difficulty with mobility. She reports having been through this in Feb and does not need PT. Issued Back handout and reviewed precautions with husband and pt continued to walk.  Answered one question for husband and he reports no concerns or questions.  Will sign off.  Please re-consult if needed.    Melvern Banker 01/31/2018, 8:07 AM Lavonia Dana, PT  [phone] 01/31/2018

## 2018-01-31 NOTE — Discharge Summary (Signed)
Physician Discharge Summary  Patient ID: Tamara Cross MRN: [id number] DOB/AGE: 14-Nov-1965 52 y.o.  Admit date: 01/30/2018 Discharge date: 01/31/2018  Admission Diagnoses: lumbar disk    Discharge Diagnoses: same   Discharged Condition: good  Hospital Course: The patient was admitted on 01/30/2018 and taken to the operating room where the patient underwent lum lam. The patient tolerated the procedure well and was taken to the recovery room and then to the floor in stable condition. The hospital course was routine. There were no complications. The wound remained clean dry and intact. Pt had appropriate back soreness. No complaints of leg pain or new N/T/W. The patient remained afebrile with stable vital signs, and tolerated a regular diet. The patient continued to increase activities, and pain was well controlled with oral pain medications.   Consults: None  Significant Diagnostic Studies:  Results for orders placed or performed during the hospital encounter of 01/30/18  Surgical pcr screen  Result Value Ref Range   MRSA, PCR NEGATIVE NEGATIVE   Staphylococcus aureus POSITIVE (A) NEGATIVE  CBC  Result Value Ref Range   WBC 6.9 4.0 - 10.5 K/uL   RBC 4.58 3.87 - 5.11 MIL/uL   Hemoglobin 13.6 12.0 - 15.0 g/dL   HCT 41.7 36.0 - 46.0 %   MCV 91.0 78.0 - 100.0 fL   MCH 29.7 26.0 - 34.0 pg   MCHC 32.6 30.0 - 36.0 g/dL   RDW 12.6 11.5 - 15.5 %   Platelets 241 150 - 400 K/uL  Basic metabolic panel  Result Value Ref Range   Sodium 140 135 - 145 mmol/L   Potassium 3.5 3.5 - 5.1 mmol/L   Chloride 104 101 - 111 mmol/L   CO2 25 22 - 32 mmol/L   Glucose, Bld 104 (H) 65 - 99 mg/dL   BUN 7 6 - 20 mg/dL   Creatinine, Ser 0.57 0.44 - 1.00 mg/dL   Calcium 9.0 8.9 - 10.3 mg/dL   GFR calc non Af Amer >60 >60 mL/min   GFR calc Af Amer >60 >60 mL/min   Anion gap 11 5 - 15  hCG, serum, qualitative  Result Value Ref Range   Preg, Serum NEGATIVE NEGATIVE    Dg Lumbar Spine 2-3  Views  Result Date: 01/30/2018 CLINICAL DATA:  L4-5 micro discectomy. EXAM: LUMBAR SPINE - 2-3 VIEW COMPARISON:  MRI of January 22, 2018. FINDINGS: Two intraoperative fluoroscopic images were obtained of the lumbar spine. The first image demonstrates surgical probe directed toward the posterior spinous process of L4. The second demonstrates surgical probe directed toward posterior elements of L5. IMPRESSION: Surgical localization as described above. Electronically Signed   By: Marijo Conception, M.D.   On: 01/30/2018 12:45    Antibiotics:  Anti-infectives (From admission, onward)   Start     Dose/Rate Route Frequency Ordered Stop   01/30/18 1524  valACYclovir (VALTREX) tablet 500 mg    Note to Pharmacy:  Reported on 12/20/2015     500 mg Oral Daily PRN 01/30/18 1525     01/30/18 0815  ceFAZolin (ANCEF) IVPB 2g/100 mL premix  Status:  Discontinued     2 g 200 mL/hr over 30 Minutes Intravenous On call to O.R. 01/30/18 0810 01/30/18 1510   01/30/18 0815  ceFAZolin (ANCEF) 2-4 GM/100ML-% IVPB    Note to Pharmacy:  Starleen Arms   : cabinet override      01/30/18 0815 01/30/18 2029      Discharge Exam: Blood pressure (!) 102/57, pulse  72, temperature 98.3 F (36.8 C), temperature source Oral, resp. rate 17, height 5\' 4"  (1.626 m), weight 96.2 kg (212 lb), last menstrual period 04/11/2012, SpO2 97 %. Neurologic: Grossly normal Incision CDI  Discharge Medications:   Allergies as of 01/31/2018      Reactions   Imitrex [sumatriptan] Anaphylaxis   Sulfonamide Derivatives Shortness Of Breath   Reglan [metoclopramide] Other (See Comments)   REACTION: Possible cause of SVT.   Zofran [ondansetron Hcl] Palpitations, Other (See Comments)   REACTION: Possible cause of SVT   Hibiclens [chlorhexidine] Rash   rash      Medication List    TAKE these medications   acetaminophen 500 MG tablet Commonly known as:  TYLENOL Take 1,000 mg by mouth daily as needed for mild pain.   albuterol 108 (90  Base) MCG/ACT inhaler Commonly known as:  PROVENTIL HFA;VENTOLIN HFA Inhale 1-2 puffs into the lungs every 6 (six) hours as needed for wheezing or shortness of breath. What changed:  how much to take   b complex vitamins tablet Take 1 tablet by mouth daily.   budesonide-formoterol 160-4.5 MCG/ACT inhaler Commonly known as:  SYMBICORT Inhale 2 puffs into the lungs 2 (two) times daily.   cetirizine 10 MG tablet Commonly known as:  ZYRTEC Take 10 mg by mouth daily.   desvenlafaxine 100 MG 24 hr tablet Commonly known as:  PRISTIQ Take 100 mg by mouth daily.   fluticasone 50 MCG/ACT nasal spray Commonly known as:  FLONASE Place 2 sprays into both nostrils daily.   gabapentin 100 MG capsule Commonly known as:  NEURONTIN Take 1-2 capsules (100-200 mg total) by mouth 2 (two) times daily.   ibuprofen 200 MG tablet Commonly known as:  ADVIL,MOTRIN Take 400 mg by mouth every 8 (eight) hours as needed for headache or mild pain. What changed:  Another medication with the same name was changed. Make sure you understand how and when to take each.   ibuprofen 600 MG tablet Commonly known as:  ADVIL,MOTRIN Take 1 tablet (600 mg total) by mouth every 6 (six) hours as needed. What changed:  reasons to take this   metoprolol succinate 50 MG 24 hr tablet Commonly known as:  TOPROL-XL Take 1 tablet (50 mg total) by mouth daily. Take with or immediately following a meal.   montelukast 10 MG tablet Commonly known as:  SINGULAIR Take 1 tablet (10 mg total) by mouth daily.   oxyCODONE 5 MG immediate release tablet Commonly known as:  Oxy IR/ROXICODONE Take 1 tablet (5 mg total) by mouth every 6 (six) hours as needed for moderate pain ((score 4 to 6)).   pseudoephedrine 30 MG tablet Commonly known as:  SUDAFED Take 60-120 mg by mouth daily as needed for congestion (pain).   valACYclovir 500 MG tablet Commonly known as:  VALTREX Take 500 mg by mouth daily as needed (for breakouts).  Reported on 12/20/2015   VYVANSE 50 MG capsule Generic drug:  lisdexamfetamine Take 50 mg by mouth every morning.       Disposition: home   Final Dx: microdiskectomy  Discharge Instructions    Call MD for:  difficulty breathing, headache or visual disturbances   Complete by:  As directed    Call MD for:  persistant nausea and vomiting   Complete by:  As directed    Call MD for:  redness, tenderness, or signs of infection (pain, swelling, redness, odor or green/yellow discharge around incision site)   Complete by:  As directed  Call MD for:  severe uncontrolled pain   Complete by:  As directed    Call MD for:  temperature >100.4   Complete by:  As directed    Diet - low sodium heart healthy   Complete by:  As directed    Increase activity slowly   Complete by:  As directed    No wound care   Complete by:  As directed          Signed: Aisa Schoeppner S 01/31/2018, 9:23 AM

## 2018-01-31 NOTE — Progress Notes (Signed)
Patient is discharged from room 3C02 at this time. Alert and in stable condition. IV site d/c'd and instructions read to patient and spouse with understanding verbalized. Left unit with all belongings at side.

## 2018-02-02 ENCOUNTER — Encounter (HOSPITAL_COMMUNITY): Payer: Self-pay | Admitting: Neurosurgery

## 2018-02-02 MED FILL — Thrombin For Soln 5000 Unit: CUTANEOUS | Qty: 5000 | Status: AC

## 2018-02-03 NOTE — Anesthesia Postprocedure Evaluation (Signed)
Anesthesia Post Note  Patient: Tamara Cross  Procedure(s) Performed: MICRODISCECTOMY LUMBAR 4- LUMBAR 5 LEFT (Left )     Patient location during evaluation: PACU Anesthesia Type: General Level of consciousness: awake Pain management: pain level controlled Vital Signs Assessment: post-procedure vital signs reviewed and stable Respiratory status: spontaneous breathing Cardiovascular status: stable Postop Assessment: no apparent nausea or vomiting Anesthetic complications: no    Last Vitals:  Vitals:   01/31/18 0501 01/31/18 0729  BP: 119/74 (!) 102/57  Pulse: 65 72  Resp: 16 17  Temp: 36.6 C 36.8 C  SpO2: 99% 97%    Last Pain:  Vitals:   01/31/18 0950  TempSrc:   PainSc: 8    Pain Goal: Patients Stated Pain Goal: 3 (01/31/18 0503)               [place]

## 2018-02-04 ENCOUNTER — Ambulatory Visit: Payer: 59 | Admitting: Internal Medicine

## 2018-02-04 ENCOUNTER — Ambulatory Visit (INDEPENDENT_AMBULATORY_CARE_PROVIDER_SITE_OTHER)
Admission: RE | Admit: 2018-02-04 | Discharge: 2018-02-04 | Disposition: A | Discharge status: Home / Self Care | Payer: 59 | Source: Ambulatory Visit | Attending: Internal Medicine | Admitting: Internal Medicine

## 2018-02-04 ENCOUNTER — Encounter: Payer: Self-pay | Admitting: Internal Medicine

## 2018-02-04 ENCOUNTER — Telehealth: Payer: Self-pay | Admitting: Internal Medicine

## 2018-02-04 VITALS — BP 144/86 | HR 97 | Temp 98.5°F | Ht 64.0 in | Wt 225.0 lb

## 2018-02-04 DIAGNOSIS — S8002XA Contusion of left knee, initial encounter: Secondary | ICD-10-CM

## 2018-02-04 DIAGNOSIS — M25552 Pain in left hip: Secondary | ICD-10-CM

## 2018-02-04 DIAGNOSIS — M5440 Lumbago with sciatica, unspecified side: Secondary | ICD-10-CM | POA: Diagnosis not present

## 2018-02-04 DIAGNOSIS — M179 Osteoarthritis of knee, unspecified: Secondary | ICD-10-CM | POA: Insufficient documentation

## 2018-02-04 DIAGNOSIS — S79912A Unspecified injury of left hip, initial encounter: Secondary | ICD-10-CM | POA: Diagnosis not present

## 2018-02-04 DIAGNOSIS — R7989 Other specified abnormal findings of blood chemistry: Secondary | ICD-10-CM

## 2018-02-04 DIAGNOSIS — G8929 Other chronic pain: Secondary | ICD-10-CM

## 2018-02-04 DIAGNOSIS — S8000XA Contusion of unspecified knee, initial encounter: Secondary | ICD-10-CM | POA: Insufficient documentation

## 2018-02-04 MED ORDER — METOPROLOL SUCCINATE ER 50 MG PO TB24: 50.0000 mg | ORAL_TABLET | Freq: Every day | ORAL | 11 refills | Status: DC

## 2018-02-04 MED ORDER — VITAMIN D3 1.25 MG (50000 UT) PO CAPS: 1.0000 | ORAL_CAPSULE | ORAL | 0 refills | Status: DC

## 2018-02-04 MED ORDER — KETOROLAC TROMETHAMINE 60 MG/2ML IM SOLN
60.0000 mg | Freq: Once | INTRAMUSCULAR | Status: AC
  Administered 2018-02-04: 60 mg via INTRAMUSCULAR

## 2018-02-04 NOTE — Assessment & Plan Note (Signed)
Severe pain X ray

## 2018-02-04 NOTE — Telephone Encounter (Signed)
Copied from [place] [id number]. Topic: Quick Communication - See Telephone Encounter >> Feb 04, 2018  2:57 PM Aurelio Brash B wrote: CRM for notification. See Telephone encounter for: 02/04/18.  PT called for xray results

## 2018-02-04 NOTE — Assessment & Plan Note (Signed)
L hip contusion 4/19 severe

## 2018-02-04 NOTE — Progress Notes (Signed)
Subjective:  Patient ID: Tamara Cross, female    DOB: 13-Apr-1966  Age: 52 y.o. MRN: [id number]  CC: No chief complaint on file.   HPI Tamara Cross presents for two HNP L4-5 surgeries in Feb and Apr 2019 The pt fell the day prior to her 2nd surgery on 4/26 on the L knee and L hip. The fall was hard.  C/o L hip pain - severe. The pt is on Oxy 10 mg qid. Pain is spastic. Getting worse. Sleeping in a recliner.   Outpatient Medications Prior to Visit  Medication Sig Dispense Refill  . acetaminophen (TYLENOL) 500 MG tablet Take 1,000 mg by mouth daily as needed for mild pain.    [name] albuterol (PROVENTIL HFA;VENTOLIN HFA) 108 (90 Base) MCG/ACT inhaler Inhale 1-2 puffs into the lungs every 6 (six) hours as needed for wheezing or shortness of breath. (Patient taking differently: Inhale 2 puffs into the lungs every 6 (six) hours as needed for wheezing or shortness of breath. ) 1 Inhaler 0  . b complex vitamins tablet Take 1 tablet by mouth daily.    . budesonide-formoterol (SYMBICORT) 160-4.5 MCG/ACT inhaler Inhale 2 puffs into the lungs 2 (two) times daily. 1 Inhaler 6  . cetirizine (ZYRTEC) 10 MG tablet Take 10 mg by mouth daily.    . cyclobenzaprine (FLEXERIL) 10 MG tablet TK 1 T PO TID PRN FOR SPASMS  0  . desvenlafaxine (PRISTIQ) 100 MG 24 hr tablet Take 100 mg by mouth daily.      . fluticasone (FLONASE) 50 MCG/ACT nasal spray Place 2 sprays into both nostrils daily. 16 g 0  . gabapentin (NEURONTIN) 100 MG capsule Take 1-2 capsules (100-200 mg total) by mouth 2 (two) times daily. 120 capsule 3  . ibuprofen (ADVIL,MOTRIN) 200 MG tablet Take 400 mg by mouth every 8 (eight) hours as needed for headache or mild pain.    [name] ibuprofen (ADVIL,MOTRIN) 600 MG tablet Take 1 tablet (600 mg total) by mouth every 6 (six) hours as needed. (Patient taking differently: Take 600 mg by mouth every 6 (six) hours as needed for moderate pain. ) 30 tablet 0  . metoprolol succinate (TOPROL-XL) 50 MG 24 hr tablet  Take 1 tablet (50 mg total) by mouth daily. Take with or immediately following a meal. 30 tablet 0  . montelukast (SINGULAIR) 10 MG tablet Take 1 tablet (10 mg total) by mouth daily. 90 tablet 3  . oxyCODONE (OXY IR/ROXICODONE) 5 MG immediate release tablet Take 1 tablet (5 mg total) by mouth every 6 (six) hours as needed for moderate pain ((score 4 to 6)). 30 tablet 0  . predniSONE (STERAPRED UNI-PAK 48 TAB) 10 MG (48) TBPK tablet TK UTD  0  . pseudoephedrine (SUDAFED) 30 MG tablet Take 60-120 mg by mouth daily as needed for congestion (pain).    . valACYclovir (VALTREX) 500 MG tablet Take 500 mg by mouth daily as needed (for breakouts). Reported on 12/20/2015    . VYVANSE 50 MG capsule Take 50 mg by mouth every morning.   0   Facility-Administered Medications Prior to Visit  Medication Dose Route Frequency Provider Last Rate Last Dose  . gadopentetate dimeglumine (MAGNEVIST) injection 18 mL  18 mL Intravenous Once PRN Penumalli, Vikram R, MD        ROS Review of Systems  Constitutional: Positive for unexpected weight change. Negative for activity change, appetite change, chills and fatigue.  HENT: Negative for congestion, mouth sores and sinus pressure.  Eyes: Negative for visual disturbance.  Respiratory: Negative for cough and chest tightness.   Gastrointestinal: Negative for abdominal pain and nausea.  Genitourinary: Negative for difficulty urinating, frequency and vaginal pain.  Musculoskeletal: Positive for arthralgias, back pain and gait problem.  Skin: Negative for pallor and rash.  Neurological: Negative for dizziness, tremors, weakness, numbness and headaches.  Psychiatric/Behavioral: Positive for sleep disturbance. Negative for confusion.    Objective:  BP (!) 144/86 (BP Location: Left Arm, Patient Position: Sitting, Cuff Size: Large)   Pulse 97   Temp 98.5 F (36.9 C) (Oral)   Ht 5\' 4"  (1.626 m)   Wt 225 lb (102.1 kg)   LMP 04/11/2012   SpO2 99%   BMI 38.62 kg/m    BP Readings from Last 3 Encounters:  02/04/18 (!) 144/86  01/31/18 (!) 102/57  12/22/17 137/89    Wt Readings from Last 3 Encounters:  02/04/18 225 lb (102.1 kg)  01/30/18 212 lb (96.2 kg)  12/22/17 215 lb (97.5 kg)    Physical Exam  Constitutional: She appears well-developed. No distress.  HENT:  Head: Normocephalic.  Right Ear: External ear normal.  Left Ear: External ear normal.  Nose: Nose normal.  Mouth/Throat: Oropharynx is clear and moist.  Eyes: Pupils are equal, round, and reactive to light. Conjunctivae are normal. Right eye exhibits no discharge. Left eye exhibits no discharge.  Neck: Normal range of motion. Neck supple. No JVD present. No tracheal deviation present. No thyromegaly present.  Cardiovascular: Normal rate, regular rhythm and normal heart sounds.  Pulmonary/Chest: No stridor. No respiratory distress. She has no wheezes.  Abdominal: Soft. Bowel sounds are normal. She exhibits no distension and no mass. There is no tenderness. There is no rebound and no guarding.  Musculoskeletal: She exhibits tenderness. She exhibits no edema.  Lymphadenopathy:    She has no cervical adenopathy.  Neurological: She displays normal reflexes. No cranial nerve deficit. She exhibits normal muscle tone. Coordination normal.  Skin: No rash noted. No erythema.  Psychiatric: She has a normal mood and affect. Her behavior is normal. Judgment and thought content normal.    Lab Results  Component Value Date   WBC 6.9 01/30/2018   HGB 13.6 01/30/2018   HCT 41.7 01/30/2018   PLT 241 01/30/2018   GLUCOSE 104 (H) 01/30/2018   CHOL 178 08/23/2008   TRIG 47 08/23/2008   HDL 52.7 08/23/2008   LDLCALC 116 (H) 08/23/2008   ALT 20 10/21/2017   AST 14 10/21/2017   NA 140 01/30/2018   K 3.5 01/30/2018   CL 104 01/30/2018   CREATININE 0.57 01/30/2018   BUN 7 01/30/2018   CO2 25 01/30/2018   TSH 5.12 (H) 10/21/2017    Dg Lumbar Spine 2-3 Views  Result Date:  01/30/2018 CLINICAL DATA:  L4-5 micro discectomy. EXAM: LUMBAR SPINE - 2-3 VIEW COMPARISON:  MRI of January 22, 2018. FINDINGS: Two intraoperative fluoroscopic images were obtained of the lumbar spine. The first image demonstrates surgical probe directed toward the posterior spinous process of L4. The second demonstrates surgical probe directed toward posterior elements of L5. IMPRESSION: Surgical localization as described above. Electronically Signed   By: Marijo Conception, M.D.   On: 01/30/2018 12:45    Assessment & Plan:   There are no diagnoses linked to this encounter. I am having Tamara Cross maintain her desvenlafaxine, valACYclovir, acetaminophen, VYVANSE, budesonide-formoterol, ibuprofen, albuterol, gabapentin, montelukast, b complex vitamins, cetirizine, ibuprofen, pseudoephedrine, fluticasone, metoprolol succinate, oxyCODONE, cyclobenzaprine, and predniSONE.  No orders  of the defined types were placed in this encounter.    Follow-up: No follow-ups on file.  Walker Kehr, MD

## 2018-02-04 NOTE — Patient Instructions (Signed)
Start Ibuprofen Rx

## 2018-02-04 NOTE — Assessment & Plan Note (Signed)
Better w/HNP surgery 2/19 and 4/19

## 2018-02-04 NOTE — Addendum Note (Signed)
Addended by: Karren Cobble on: 02/04/2018 11:59 AM   Modules accepted: Orders

## 2018-02-05 ENCOUNTER — Encounter: Payer: Self-pay | Admitting: Internal Medicine

## 2018-02-06 NOTE — Telephone Encounter (Signed)
My chart message sent yesterday and viewed by pt

## 2018-02-09 ENCOUNTER — Encounter (HOSPITAL_COMMUNITY): Payer: Self-pay

## 2018-02-09 ENCOUNTER — Emergency Department (HOSPITAL_COMMUNITY)
Admission: EM | Admit: 2018-02-09 | Discharge: 2018-02-09 | Disposition: A | Discharge status: Home / Self Care | Payer: 59 | Attending: Emergency Medicine | Admitting: Emergency Medicine

## 2018-02-09 ENCOUNTER — Other Ambulatory Visit: Payer: Self-pay

## 2018-02-09 ENCOUNTER — Emergency Department (HOSPITAL_BASED_OUTPATIENT_CLINIC_OR_DEPARTMENT_OTHER)
Admit: 2018-02-09 | Discharge: 2018-02-09 | Disposition: A | Discharge status: Home / Self Care | Payer: 59 | Attending: Emergency Medicine | Admitting: Emergency Medicine

## 2018-02-09 DIAGNOSIS — F17210 Nicotine dependence, cigarettes, uncomplicated: Secondary | ICD-10-CM | POA: Insufficient documentation

## 2018-02-09 DIAGNOSIS — M25852 Other specified joint disorders, left hip: Secondary | ICD-10-CM | POA: Insufficient documentation

## 2018-02-09 DIAGNOSIS — M79605 Pain in left leg: Secondary | ICD-10-CM | POA: Diagnosis present

## 2018-02-09 DIAGNOSIS — Z79899 Other long term (current) drug therapy: Secondary | ICD-10-CM | POA: Diagnosis not present

## 2018-02-09 DIAGNOSIS — M79609 Pain in unspecified limb: Secondary | ICD-10-CM

## 2018-02-09 LAB — CBC WITH DIFFERENTIAL/PLATELET
BASOS ABS: 0.1 10*3/uL (ref 0.0–0.1)
Basophils Relative: 0 %
EOS PCT: 1 %
Eosinophils Absolute: 0.2 10*3/uL (ref 0.0–0.7)
HCT: 43.2 % (ref 36.0–46.0)
HEMOGLOBIN: 14.1 g/dL (ref 12.0–15.0)
LYMPHS PCT: 23 %
Lymphs Abs: 3.8 10*3/uL (ref 0.7–4.0)
MCH: 30.7 pg (ref 26.0–34.0)
MCHC: 32.6 g/dL (ref 30.0–36.0)
MCV: 94.1 fL (ref 78.0–100.0)
Monocytes Absolute: 1.1 10*3/uL — ABNORMAL HIGH (ref 0.1–1.0)
Monocytes Relative: 7 %
NEUTROS PCT: 69 %
Neutro Abs: 11.3 10*3/uL — ABNORMAL HIGH (ref 1.7–7.7)
PLATELETS: 330 10*3/uL (ref 150–400)
RBC: 4.59 MIL/uL (ref 3.87–5.11)
RDW: 14 % (ref 11.5–15.5)
WBC: 16.5 10*3/uL — ABNORMAL HIGH (ref 4.0–10.5)

## 2018-02-09 LAB — COMPREHENSIVE METABOLIC PANEL
ALT: 36 U/L (ref 14–54)
ANION GAP: 11 (ref 5–15)
AST: 24 U/L (ref 15–41)
Albumin: 3.9 g/dL (ref 3.5–5.0)
Alkaline Phosphatase: 69 U/L (ref 38–126)
BILIRUBIN TOTAL: 0.2 mg/dL — AB (ref 0.3–1.2)
BUN: 14 mg/dL (ref 6–20)
CHLORIDE: 101 mmol/L (ref 101–111)
CO2: 28 mmol/L (ref 22–32)
Calcium: 9.3 mg/dL (ref 8.9–10.3)
Creatinine, Ser: 0.69 mg/dL (ref 0.44–1.00)
GFR calc Af Amer: >60 mL/min (ref >60)
GFR calc non Af Amer: >60 mL/min (ref >60)
Glucose, Bld: 88 mg/dL (ref 65–99)
POTASSIUM: 4.3 mmol/L (ref 3.5–5.1)
Sodium: 140 mmol/L (ref 135–145)
TOTAL PROTEIN: 6.6 g/dL (ref 6.5–8.1)

## 2018-02-09 LAB — I-STAT CG4 LACTIC ACID, ED
LACTIC ACID, VENOUS: 2.01 mmol/L — AB (ref 0.5–1.9)
Lactic Acid, Venous: 1.75 mmol/L (ref 0.5–1.9)

## 2018-02-09 LAB — URINALYSIS, ROUTINE W REFLEX MICROSCOPIC
Bilirubin Urine: NEGATIVE
Glucose, UA: NEGATIVE mg/dL
Hgb urine dipstick: NEGATIVE
KETONES UR: NEGATIVE mg/dL
LEUKOCYTES UA: NEGATIVE
NITRITE: NEGATIVE
PH: 6 (ref 5.0–8.0)
Protein, ur: NEGATIVE mg/dL
Specific Gravity, Urine: 1.011 (ref 1.005–1.030)

## 2018-02-09 MED ORDER — MORPHINE SULFATE (PF) 4 MG/ML IV SOLN
4.0000 mg | Freq: Once | INTRAVENOUS | Status: AC
  Administered 2018-02-09: 4 mg via INTRAVENOUS
  Filled 2018-02-09: qty 1

## 2018-02-09 MED ORDER — OXYCODONE-ACETAMINOPHEN 5-325 MG PO TABS: 1.0000 | ORAL_TABLET | Freq: Four times a day (QID) | ORAL | 0 refills | Status: DC | PRN

## 2018-02-09 MED ORDER — KETOROLAC TROMETHAMINE 10 MG PO TABS: 10.0000 mg | ORAL_TABLET | Freq: Four times a day (QID) | ORAL | 0 refills | Status: DC | PRN

## 2018-02-09 MED ORDER — KETOROLAC TROMETHAMINE 15 MG/ML IJ SOLN
15.0000 mg | Freq: Once | INTRAMUSCULAR | Status: AC
  Administered 2018-02-09: 15 mg via INTRAVENOUS
  Filled 2018-02-09: qty 1

## 2018-02-09 NOTE — ED Notes (Signed)
This EMT and PA helped pt to the bedside, pt asked to have some privacy. We stepped outside of the room and a few min after heard the bed ban fall onto the floor. We walked in, pt stated "are you fucking kidding me." This EMT told pt it was okay that we would clean it up. Pt stated "its no okay cause it all over me." This EMT told pt that we would clean her up and sometimes they fall out because they are not made to go in the bedsides. Pt stated "then why would you put the damn thing in there if its not made for it." This EMT cleaned the floor and wiped off the bedside and collected the urine. PA still at bedside with pt.

## 2018-02-09 NOTE — ED Notes (Signed)
Placed restrictions band on pt's left wrist. 

## 2018-02-09 NOTE — ED Triage Notes (Signed)
Patient complains of recent lumbar back surgery on 4/26. The past few days severe lower back pain running through buttocks down left leg, no new trauma. Denies fever. Sent by neurosurgeon for further evaluation. Alert and oriented

## 2018-02-09 NOTE — Discharge Instructions (Addendum)
You were seen in the emergency department for pain in your left gluteal region which goes down your leg.  The ultrasound you had of your left leg did not show signs of a blood clot.    He received morphine and Toradol in the emergency department.  We are sending you home with 2 prescriptions: -Percocet-take 1 tablet every 6 hours as needed for severe pain.  Be aware that there is Tylenol in this medication, (325 mg per tablet (be sure to keep his mind if you take additional Tylenol and do not exceed of 3200 mg daily.  Keep this on the reach of small children.  Do not drink alcohol when taking this medicine. -Toradol-this is a nonsteroidal anti-inflammatory medication, you may take this once every 6 hours as needed for pain.  Patient to take this with food as it does not upset your stomach.  You may not take other NSAIDs (naproxen, Mobic, ibuprofen, Aleve, Advil, Motrin) with this medication.  Additionally do not take prednisone.  This medication may cause stomach upset and at worst GI bleeding.  This is why it is important to take it with food intake in moderation.  We have prescribed you new medication(s) today. Discuss the medications prescribed today with your pharmacist as they can have adverse effects and interactions with your other medicines including over the counter and prescribed medications. Seek medical evaluation if you start to experience new or abnormal symptoms after taking one of these medicines, seek care immediately if you start to experience difficulty breathing, feeling of your throat closing, facial swelling, or rash as these could be indications of a more serious allergic reaction   Follow-up with your neurosurgeon in the next 24 to 48 hours.  Return to the ER for new or worsening symptoms including but not limited to worsening pain, inability to walk, fever, loss of control of your bladder function, inability to urinate, or any other concerns that you may have.

## 2018-02-09 NOTE — ED Notes (Signed)
Patient transported to vascular. 

## 2018-02-09 NOTE — ED Provider Notes (Signed)
[facility] EMERGENCY DEPARTMENT Provider Note   CSN: [id number] Arrival date & time: 02/09/18  1051     History   Chief Complaint No chief complaint on file.  HPI Tamara Cross is a 52 y.o. female with a hx of tobacco abuse, breast cancer s/p lumpectomy and radiation therapy, anemia, depression, PUD, IBS, and lumbar surgical procedures who was sent to the ED today by her neurosurgeon's office for evaluation of LLE pain that started approximately 8 days ago. Patient had an L5/S1 lumbar laminectomy/decompression microdiscectomy 11/14/17 with subsequent same procedure on L4/L5 01/30/18 by neurosurgeon Dr. Dayton Bailiff. She states she was having minimal post op pain after the surgery. Approximately 2-3 days after this she started having moderate constant discomfort described as being in the left gluteal region radiating down to the the ankle. She states that she is also having intermittent much more severe bouts of pain in this same location described as sharp/stabbing and as if someone is "tearing at my piriformis muscle". These pains seem to occur when she initially weight bears on the LLE after sitting, when she lifts the leg sometimes (straight leg raise), and if she lays on her left side. She has been taking her prescribed 10mg  oxycodone QID as well as muscle relaxers and prednisone with some relief. No other specific alleviating/aggravating factors. She has intermittent numbness to the left lateral calf and intermittent paresthesias to the L foot with spurts of pain. Her left leg feels weak. She has started ambulating with a cane. States she had follow up with her neurosurgeon today- she went to the office and explained her symptoms to office staff- she was not seen by her neurosurgeon but was instead directed to come to the emergency department. Denies incontinence, urinary retention, fever, chills, or any injuries/falls post op.   HPI  Past Medical History:  Diagnosis Date    . Allergy    chroni rhinitis  . Anemia    low iron  . Anxiety   . Arthritis   . 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 Complication of anesthesia    last 2 surgeries had some tachycardia  . Depression   . Dysrhythmia    tachy occ  . Esophageal stricture   . GERD (gastroesophageal reflux disease)   . H/O hiatal hernia   . Heart murmur    MVP  . IBS (irritable bowel syndrome)   . Migraine   . MVP (mitral valve prolapse)   . Osteoarthritis   . Peptic ulcer disease   . Pneumonia    hx  . PONV (postoperative nausea and vomiting)   . S/P radiation therapy  06/28/2014-08/08/2014    1) Right Breast / 50 Gy in 25 fractions/ 2) Right Breast Boost / 10 Gy in 5 fractions  . Skin cancer    basal cell     Patient Active Problem List   Diagnosis Date Noted  . Knee contusion 02/04/2018  . HNP (herniated nucleus pulposus), lumbar 11/14/2017  . Sciatic leg pain 10/21/2017  . Sinusitis, chronic 10/15/2016  . Asthmatic bronchitis with acute exacerbation 04/03/2016  . Acute URI 12/20/2015  . Ataxia 05/30/2015  . Liver lesion, left lobe 01/03/2015  . Gallstone pancreatitis 11/28/2014  . Dysphagia, pharyngoesophageal phase 10/06/2014  . Special screening for malignant neoplasm of colon 10/06/2014  . Dysphagia   . Special screening for malignant neoplasms, colon   . Arthralgia 09/12/2014  . Submuscular lipoma of  chest 09/12/2014  . Leg pain 08/29/2014  . Hot flashes due to tamoxifen 08/29/2014  . Low back pain 04/26/2014  . Rash and nonspecific skin eruption 04/26/2014  . Malignant neoplasm of upper-outer quadrant of right female breast ([place]) 04/13/2014  . Palpitations 11/18/2013  . Allergy   . Heart murmur   . HIP PAIN 09/07/2010  . PEDAL EDEMA 04/26/2010  . EXTERNAL HEMORRHOIDS 01/24/2010  . HIATAL HERNIA 01/24/2010  . Reflux esophagitis 12/19/2009  . GERD 12/19/2009  . Irritable bowel syndrome 12/19/2009  . Blepharospasm  12/23/2008  . Migraine 08/23/2008  . ESOPHAGEAL STRICTURE 03/11/2008  . POLYARTHRALGIA 03/11/2008  . Obesity 05/01/2007  . ANXIETY 05/01/2007  . Chronic depressive disorder 05/01/2007  . PREMATURE VENTRICULAR CONTRACTIONS 05/01/2007  . Allergic rhinitis 05/01/2007  . MITRAL VALVE PROLAPSE, HX OF 05/01/2007  . PUD, HX OF 05/01/2007    Past Surgical History:  Procedure Laterality Date  . APPENDECTOMY    . BREAST LUMPECTOMY Bilateral   . BREAST LUMPECTOMY WITH NEEDLE LOCALIZATION Bilateral 05/02/2014   Procedure: BILATERIAL BREAST LUMPECTOMY WITH NEEDLE LOCALIZATION;  Surgeon: Shann Medal, MD;  Location: [facility];  Service: General;  Laterality: Bilateral;  . CERVIX LESION DESTRUCTION    . CESAREAN SECTION  2002  . CHOLECYSTECTOMY  11/29/2014   lap chole   . CHOLECYSTECTOMY N/A 11/29/2014   Procedure: LAPAROSCOPIC CHOLECYSTECTOMY WITH INTRAOPERATIVE CHOLANGIOGRAM;  Surgeon: Doreen Salvage, MD;  Location: [facility];  Service: General;  Laterality: N/A;  . COLONOSCOPY N/A 10/06/2014   Procedure: COLONOSCOPY;  Surgeon: Lafayette Dragon, MD;  Location: [facility];  Service: Endoscopy;  Laterality: N/A;  . COLPOSCOPY    . CYSTECTOMY     left breast,in 20's  . DILATION AND CURETTAGE OF UTERUS     x2  . ESOPHAGOGASTRODUODENOSCOPY N/A 10/06/2014   Procedure: ESOPHAGOGASTRODUODENOSCOPY (EGD);  Surgeon: Lafayette Dragon, MD;  Location: [facility];  Service: Endoscopy;  Laterality: N/A;  . HAND SURGERY     cancer removed basal cell  . LUMBAR LAMINECTOMY/DECOMPRESSION MICRODISCECTOMY Left 11/14/2017   Procedure: DISCECTOMY LUMBAR Five - SACRAL - one, LEFT;  Surgeon: Ashok Pall, MD;  Location: [facility];  Service: Neurosurgery;  Laterality: Left;  MICRODISCECTOMY LUMBAR 5- SACRAL 1, LEFT  . LUMBAR LAMINECTOMY/DECOMPRESSION MICRODISCECTOMY Left 01/30/2018   Procedure: MICRODISCECTOMY LUMBAR 4- LUMBAR 5 LEFT;  Surgeon: Ashok Pall, MD;  Location: [facility];  Service: Neurosurgery;  Laterality: Left;   MICRODISCECTOMY LUMBAR 4- LUMBAR 5 LEFT  . NOVASURE ABLATION    . SHOULDER SURGERY Left    debridement ,bone spurs  . TONSILLECTOMY       OB History    Gravida  1   Para  1   Term      Preterm      AB      Living        SAB      TAB      Ectopic      Multiple      Live Births           Obstetric Comments  She is not on hormones.  She had her last menstrual period around 2013.         Home Medications    Prior to Admission medications   Medication Sig Start Date End Date Taking? Authorizing Provider  acetaminophen (TYLENOL) 500 MG tablet Take 1,000 mg by mouth daily as needed for mild pain.   Yes [provider]  albuterol (PROVENTIL HFA;VENTOLIN HFA) 108 (90  Base) MCG/ACT inhaler Inhale 1-2 puffs into the lungs every 6 (six) hours as needed for wheezing or shortness of breath. Patient taking differently: Inhale 2 puffs into the lungs every 6 (six) hours as needed for wheezing or shortness of breath.  07/31/17  Yes Plotnikov, Evie Lacks, MD  b complex vitamins tablet Take 1 tablet by mouth daily.   Yes [provider]  budesonide-formoterol (SYMBICORT) 160-4.5 MCG/ACT inhaler Inhale 2 puffs into the lungs 2 (two) times daily. 07/30/16  Yes Plotnikov, Evie Lacks, MD  cetirizine (ZYRTEC) 10 MG tablet Take 10 mg by mouth daily.   Yes [provider]  Cholecalciferol (VITAMIN D3) 50000 units CAPS Take 1 capsule by mouth once a week. 02/04/18  Yes Plotnikov, Evie Lacks, MD  cyclobenzaprine (FLEXERIL) 10 MG tablet TK 1 T PO TID PRN FOR SPASMS 01/31/18  Yes [provider]  desvenlafaxine (PRISTIQ) 100 MG 24 hr tablet Take 100 mg by mouth daily.     Yes [provider]  fluticasone (FLONASE) 50 MCG/ACT nasal spray Place 2 sprays into both nostrils daily. 12/22/17  Yes Maczis, Barth Kirks, PA-C  ibuprofen (ADVIL,MOTRIN) 200 MG tablet Take 400 mg by mouth every 8 (eight) hours as needed for headache or mild pain.   Yes [provider]  ibuprofen (ADVIL,MOTRIN) 600 MG tablet Take 1 tablet (600 mg total) by mouth every 6 (six) hours as needed. Patient taking differently: Take 600 mg by mouth every 6 (six) hours as needed for moderate pain.  10/09/16  Yes Horton, Barbette Hair, MD  metoprolol succinate (TOPROL-XL) 50 MG 24 hr tablet Take 1 tablet (50 mg total) by mouth daily. Take with or immediately following a meal. 02/04/18  Yes Plotnikov, Evie Lacks, MD  montelukast (SINGULAIR) 10 MG tablet Take 1 tablet (10 mg total) by mouth daily. 10/21/17 10/21/18 Yes Plotnikov, Evie Lacks, MD  oxyCODONE (OXY IR/ROXICODONE) 5 MG immediate release tablet Take 1 tablet (5 mg total) by mouth every 6 (six) hours as needed for moderate pain ((score 4 to 6)). 01/31/18  Yes Eustace Moore, MD  predniSONE (STERAPRED UNI-PAK 48 TAB) 10 MG (48) TBPK tablet TK UTD 01/31/18  Yes [provider]  pseudoephedrine (SUDAFED) 30 MG tablet Take 60-120 mg by mouth daily as needed for congestion (pain).   Yes [provider]  valACYclovir (VALTREX) 500 MG tablet Take 500 mg by mouth daily as needed (for breakouts). Reported on 12/20/2015   Yes [provider]  VYVANSE 50 MG capsule Take 50 mg by mouth every morning.  11/06/14  Yes [provider]  gabapentin (NEURONTIN) 100 MG capsule Take 1-2 capsules (100-200 mg total) by mouth 2 (two) times daily. Patient not taking: Reported on 02/09/2018 10/21/17   Plotnikov, Evie Lacks, MD    Family History Family History  Problem Relation Age of Onset  . Heart disease Father   . Hypertension Father   . Colon cancer Maternal Grandmother   . Depression Brother   . Diabetes Maternal Grandfather   . Heart disease Maternal Grandfather   . Colon cancer Maternal Uncle        x 2  . Colon polyps Unknown        x 9 maternal uncles/aunts  . Breast cancer Neg Hx     Social History Social History   Tobacco Use  . Smoking status: Current Every Day Smoker    Packs/day: 1.00    Years:  10.00    Pack years: 10.00  Types: Cigarettes    Start date: 02/23/1998  . Smokeless tobacco: Never Used  Substance Use Topics  . Alcohol use: Yes    Alcohol/week: 3.0 oz    Types: 5 Glasses of wine per week    Comment: socially  . Drug use: No    Comment: quit for 15 yrs smoked for 15 started again 1 yr     Allergies   Imitrex [sumatriptan]; Sulfonamide derivatives; Reglan [metoclopramide]; Zofran [ondansetron hcl]; and Hibiclens [chlorhexidine]   Review of Systems Review of Systems  Constitutional: Negative for chills and fever.  Respiratory: Negative for shortness of breath.   Cardiovascular: Negative for chest pain.  Gastrointestinal: Negative for abdominal pain.  Genitourinary: Negative for dysuria.  Musculoskeletal: Positive for back pain (left gluteal region) and myalgias (LLE).  Neurological: Positive for weakness and numbness.       Negative for incontinence or urinary retention.   All other systems reviewed and are negative.   Physical Exam Updated Vital Signs BP 130/87 (BP Location: Left Arm)   Pulse 100   Temp 98.5 F (36.9 C) (Oral)   Resp 18   LMP 04/11/2012   SpO2 100%   Physical Exam  Constitutional: She appears well-developed and well-nourished. No distress.  HENT:  Head: Normocephalic and atraumatic.  Eyes: Conjunctivae are normal. Right eye exhibits no discharge. Left eye exhibits no discharge.  Cardiovascular: Normal rate and regular rhythm.  No murmur heard. Pulses:      Dorsalis pedis pulses are 2+ on the right side, and 2+ on the left side.       Posterior tibial pulses are 2+ on the right side, and 2+ on the left side.  Pulmonary/Chest: Effort normal and breath sounds normal. No respiratory distress. She has no wheezes. She has no rhonchi. She has no rales.  Abdominal: Soft. She exhibits no distension. There is no tenderness.  Musculoskeletal:  Back/Lower extremities: Patient has a midline well-healing surgical scar in the lower lumbar  region.  This appears to have some scabbing.  There is no surrounding erythema or discharge from the wound.  There is no overlying warmth.  She has some purple discoloration to the anterior aspect of her left knee, consistent with fall prior to surgery and bruising which has been present prior to surgical procedure.  Back/gluteal region/lower extremity are otherwise without obvious deformity, appreciable swelling, erythema, ecchymosis or open wounds.  There is no overlying warmth.  She has fairly normal range of motion to bilateral hips, knees, and ankles. She is tender to palpation over the left gluteal region. There is no midline tenderness. LEs are otherwise nontender.   Neurological: She is alert.  Clear speech.  Patient has 5 out of 5 symmetric strength plantar dorsiflexion bilaterally.  She has 4 out of 5 strength with left hip flexion and 5 out of 5 strength right hip flexion.  Her sensation is intact to sharp and dull touch to bilateral lower extremities.  She is able to ambulate antalgically with a cane.  Skin: Skin is warm and dry. No rash noted.  Psychiatric: She has a normal mood and affect. Her behavior is normal.  Nursing note and vitals reviewed.    ED Treatments / Results  Labs Results for orders placed or performed during the hospital encounter of 02/09/18  Comprehensive metabolic panel  Result Value Ref Range   Sodium 140 135 - 145 mmol/L   Potassium 4.3 3.5 - 5.1 mmol/L   Chloride 101 101 - 111 mmol/L  CO2 28 22 - 32 mmol/L   Glucose, Bld 88 65 - 99 mg/dL   BUN 14 6 - 20 mg/dL   Creatinine, Ser 0.69 0.44 - 1.00 mg/dL   Calcium 9.3 8.9 - 10.3 mg/dL   Total Protein 6.6 6.5 - 8.1 g/dL   Albumin 3.9 3.5 - 5.0 g/dL   AST 24 15 - 41 U/L   ALT 36 14 - 54 U/L   Alkaline Phosphatase 69 38 - 126 U/L   Total Bilirubin 0.2 (L) 0.3 - 1.2 mg/dL   GFR calc non Af Amer >60 >60 mL/min   GFR calc Af Amer >60 >60 mL/min   Anion gap 11 5 - 15  CBC with Differential  Result Value Ref  Range   WBC 16.5 (H) 4.0 - 10.5 K/uL   RBC 4.59 3.87 - 5.11 MIL/uL   Hemoglobin 14.1 12.0 - 15.0 g/dL   HCT 43.2 36.0 - 46.0 %   MCV 94.1 78.0 - 100.0 fL   MCH 30.7 26.0 - 34.0 pg   MCHC 32.6 30.0 - 36.0 g/dL   RDW 14.0 11.5 - 15.5 %   Platelets 330 150 - 400 K/uL   Neutrophils Relative % 69 %   Neutro Abs 11.3 (H) 1.7 - 7.7 K/uL   Lymphocytes Relative 23 %   Lymphs Abs 3.8 0.7 - 4.0 K/uL   Monocytes Relative 7 %   Monocytes Absolute 1.1 (H) 0.1 - 1.0 K/uL   Eosinophils Relative 1 %   Eosinophils Absolute 0.2 0.0 - 0.7 K/uL   Basophils Relative 0 %   Basophils Absolute 0.1 0.0 - 0.1 K/uL  Urinalysis, Routine w reflex microscopic  Result Value Ref Range   Color, Urine YELLOW YELLOW   APPearance CLEAR CLEAR   Specific Gravity, Urine 1.011 1.005 - 1.030   pH 6.0 5.0 - 8.0   Glucose, UA NEGATIVE NEGATIVE mg/dL   Hgb urine dipstick NEGATIVE NEGATIVE   Bilirubin Urine NEGATIVE NEGATIVE   Ketones, ur NEGATIVE NEGATIVE mg/dL   Protein, ur NEGATIVE NEGATIVE mg/dL   Nitrite NEGATIVE NEGATIVE   Leukocytes, UA NEGATIVE NEGATIVE  I-Stat CG4 Lactic Acid, ED  Result Value Ref Range   Lactic Acid, Venous 2.01 (HH) 0.5 - 1.9 mmol/L   Comment NOTIFIED PHYSICIAN   I-Stat CG4 Lactic Acid, ED  Result Value Ref Range   Lactic Acid, Venous 1.75 0.5 - 1.9 mmol/L   EKG None  Radiology No results found.  Procedures Procedures (including critical care time)  Medications Ordered in ED Medications  morphine 4 MG/ML injection 4 mg (4 mg Intravenous Given 02/09/18 1413)  ketorolac (TORADOL) 15 MG/ML injection 15 mg (15 mg Intravenous Given 02/09/18 1633)    Initial Impression / Assessment and Plan / ED Course  I have reviewed the triage vital signs and the nursing notes.  Pertinent labs & imaging results that were available during my care of the patient were reviewed by me and considered in my medical decision making (see chart for details).   Patient presents at request of her Neurosurgeon  for L gluteal pain which radiates down LLE. Patient is nontoxic appearing, in no apparent distress, initial vitals are WNL. On exam patient is tender to palpation in L gluteal region. She has some mild decrease in strength in the L hip with flexion. She is NVI distally and is able to ambulate with some discomfort. Lab work initiated by triage reviewed- of note patient has a nonspecific leukocytosis at 16.5 with initial lactic acid  of 2.01, repeat lactic improved to 1.75. Unclear etiology to these lab findings- patient is afebrile, she is without erythema/discharge/warmth at her surgical site, do not suspect post-op infection or sepsis at this time. She is ambulatory without significant neurologic deficits, post residual bladder scan obtained with only 104 mL- doubt spinal cord compression. Given she reported some swelling in the LLE Ultrasound was ordered to evaluate for DVT- this was negative, patient has symmetric trace edema, and symmetric DP/PT pulses. She has not had any subsequent falls or injuries to concern me for fracture/dislocation. She was treated with morphine and toradol with improvement in her discomfort.   Findings and plan of care discussed with supervising physician Dr. Kathrynn Humble who personally evaluated and examined this patient- suspect impingement syndrome, she is requesting Toradol prescription and is running out of her oxycodone at home- Dr. Kathrynn Humble instructs prescriptions for Toradol and short course of Percocet with DC home and neurosurgery follow up.   I discussed results, treatment plan, need for neurosurgery follow-up, and return precautions with the patient. Provided opportunity for questions, patient confirmed understanding and is in agreement with plan.    Final Clinical Impressions(s) / ED Diagnoses   Final diagnoses:  Left hip impingement syndrome    ED Discharge Orders        Ordered    ketorolac (TORADOL) 10 MG tablet  Every 6 hours PRN     02/09/18 1651     oxyCODONE-acetaminophen (PERCOCET/ROXICET) 5-325 MG tablet  Every 6 hours PRN     02/09/18 1651       Terin Dierolf, Shelbina R, PA-C 02/09/18 1847    Varney Biles, MD 02/11/18 1900

## 2018-02-09 NOTE — ED Notes (Signed)
This RN attempted IV access twice without success, another RN to try

## 2018-02-09 NOTE — ED Notes (Signed)
Pt transported to vascular.  °

## 2018-02-09 NOTE — Progress Notes (Signed)
Preliminary results by tech - Venous Duplex Left Lower Ext. Completed. Negative for deep and superficial vein thrombosis. Husna Krone, BS, RDMS, RVT  

## 2018-02-09 NOTE — ED Notes (Signed)
Notified RN Marya Amsler of high lactic

## 2018-02-13 ENCOUNTER — Telehealth: Payer: Self-pay | Admitting: Internal Medicine

## 2018-02-13 NOTE — Telephone Encounter (Signed)
Please advise 

## 2018-02-13 NOTE — Telephone Encounter (Signed)
Noted. PLet me know when she needs a new pain med Rx Thx

## 2018-02-13 NOTE — Telephone Encounter (Signed)
Copied from [place] [id number]. Topic: Quick Communication - See Telephone Encounter >> Feb 13, 2018 10:29 AM Rutherford Nail, NT wrote: CRM for notification. See Telephone encounter for: 02/13/18. Patient states that when she last saw Dr. Alain Marion she had a fall and had recent back surgery. States she was in pain then, but had pain medication. States he said if the pain continues and she runs out of pain medication, she states that he told her he would write a prescription for pain medication for her. Please advise. CB#:  [phone]  [id number] - [place], [place]

## 2018-02-17 DIAGNOSIS — M545 Low back pain: Secondary | ICD-10-CM | POA: Diagnosis not present

## 2018-02-19 DIAGNOSIS — M25552 Pain in left hip: Secondary | ICD-10-CM | POA: Diagnosis not present

## 2018-02-19 MED ORDER — KETOROLAC TROMETHAMINE 10 MG PO TABS: 10.0000 mg | ORAL_TABLET | Freq: Three times a day (TID) | ORAL | 0 refills | Status: DC | PRN

## 2018-02-19 MED ORDER — OXYCODONE HCL 5 MG PO TABS: 5.0000 mg | ORAL_TABLET | Freq: Four times a day (QID) | ORAL | 0 refills | Status: DC | PRN

## 2018-02-19 NOTE — Telephone Encounter (Signed)
Ok Thx 

## 2018-02-19 NOTE — Telephone Encounter (Addendum)
Pt states she has 2 days left of current pain medication and is currently on Oxy immediate release 5MG  tablets 1 tab Q6H prn  Pt would also like to know if you can send in toradol that she was prescribed in ED because it seems to really help with the pain.

## 2018-02-25 ENCOUNTER — Ambulatory Visit: Payer: Self-pay | Admitting: Internal Medicine

## 2018-02-26 DIAGNOSIS — M5116 Intervertebral disc disorders with radiculopathy, lumbar region: Secondary | ICD-10-CM | POA: Diagnosis not present

## 2018-02-26 DIAGNOSIS — M5126 Other intervertebral disc displacement, lumbar region: Secondary | ICD-10-CM | POA: Diagnosis not present

## 2018-03-12 ENCOUNTER — Ambulatory Visit: Payer: 59 | Admitting: Family

## 2018-03-12 ENCOUNTER — Encounter: Payer: Self-pay | Admitting: Family

## 2018-03-12 VITALS — BP 132/88 | HR 101 | Temp 98.0°F | Ht 64.0 in | Wt 218.1 lb

## 2018-03-12 DIAGNOSIS — M5126 Other intervertebral disc displacement, lumbar region: Secondary | ICD-10-CM

## 2018-03-12 DIAGNOSIS — G629 Polyneuropathy, unspecified: Secondary | ICD-10-CM | POA: Diagnosis not present

## 2018-03-12 DIAGNOSIS — M5440 Lumbago with sciatica, unspecified side: Secondary | ICD-10-CM | POA: Diagnosis not present

## 2018-03-12 DIAGNOSIS — G8929 Other chronic pain: Secondary | ICD-10-CM

## 2018-03-12 MED ORDER — GABAPENTIN 100 MG PO CAPS: 100.0000 mg | ORAL_CAPSULE | Freq: Three times a day (TID) | ORAL | 3 refills | Status: DC

## 2018-03-12 NOTE — Patient Instructions (Signed)
Start your Neurontin 200 mg qhs x 3 days; Increase to 200 mg twice a day x 3 days; Increase to 200 mg three x a day;   I will talk to Dr. Jacalyn Lefevre about neurosurgery consult;

## 2018-03-12 NOTE — Progress Notes (Signed)
Tamara Cross is a 52 y.o. female with the following history as recorded in [employer]:  Patient Active Problem List   Diagnosis Date Noted  . Knee contusion 02/04/2018  . HNP (herniated nucleus pulposus), lumbar 11/14/2017  . Sciatic leg pain 10/21/2017  . Sinusitis, chronic 10/15/2016  . Asthmatic bronchitis with acute exacerbation 04/03/2016  . Acute URI 12/20/2015  . Ataxia 05/30/2015  . Liver lesion, left lobe 01/03/2015  . Gallstone pancreatitis 11/28/2014  . Dysphagia, pharyngoesophageal phase 10/06/2014  . Special screening for malignant neoplasm of colon 10/06/2014  . Dysphagia   . Special screening for malignant neoplasms, colon   . Arthralgia 09/12/2014  . Submuscular lipoma of chest 09/12/2014  . Leg pain 08/29/2014  . Hot flashes due to tamoxifen 08/29/2014  . Low back pain 04/26/2014  . Rash and nonspecific skin eruption 04/26/2014  . 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 Palpitations 11/18/2013  . Allergy   . Heart murmur   . HIP PAIN 09/07/2010  . PEDAL EDEMA 04/26/2010  . EXTERNAL HEMORRHOIDS 01/24/2010  . HIATAL HERNIA 01/24/2010  . Reflux esophagitis 12/19/2009  . GERD 12/19/2009  . Irritable bowel syndrome 12/19/2009  . 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  . Migraine 08/23/2008  . ESOPHAGEAL STRICTURE 03/11/2008  . POLYARTHRALGIA 03/11/2008  . Obesity 05/01/2007  . ANXIETY 05/01/2007  . Chronic depressive disorder 05/01/2007  . PREMATURE VENTRICULAR CONTRACTIONS 05/01/2007  . Allergic rhinitis 05/01/2007  . MITRAL VALVE PROLAPSE, HX OF 05/01/2007  . PUD, HX OF 05/01/2007    Current Outpatient Medications  Medication Sig Dispense Refill  . acetaminophen (TYLENOL) 500 MG tablet Take 1,000 mg by mouth daily as needed for mild pain.    [name] albuterol (PROVENTIL HFA;VENTOLIN HFA) 108 (90 Base) MCG/ACT inhaler Inhale 1-2 puffs into the lungs every 6 (six) hours as needed for wheezing or shortness of breath. (Patient  taking differently: Inhale 2 puffs into the lungs every 6 (six) hours as needed for wheezing or shortness of breath. ) 1 Inhaler 0  . b complex vitamins tablet Take 1 tablet by mouth daily.    . budesonide-formoterol (SYMBICORT) 160-4.5 MCG/ACT inhaler Inhale 2 puffs into the lungs 2 (two) times daily. 1 Inhaler 6  . cetirizine (ZYRTEC) 10 MG tablet Take 10 mg by mouth daily.    . Cholecalciferol (VITAMIN D3) 50000 units CAPS Take 1 capsule by mouth once a week. 6 capsule 0  . cyclobenzaprine (FLEXERIL) 10 MG tablet TK 1 T PO TID PRN FOR SPASMS  0  . desvenlafaxine (PRISTIQ) 100 MG 24 hr tablet Take 100 mg by mouth daily.      . fluticasone (FLONASE) 50 MCG/ACT nasal spray Place 2 sprays into both nostrils daily. 16 g 0  . gabapentin (NEURONTIN) 100 MG capsule Take 1-2 capsules (100-200 mg total) by mouth 3 (three) times daily. 180 capsule 3  . ibuprofen (ADVIL,MOTRIN) 600 MG tablet Take 1 tablet (600 mg total) by mouth every 6 (six) hours as needed. (Patient taking differently: Take 600 mg by mouth every 6 (six) hours as needed for moderate pain. ) 30 tablet 0  . ketorolac (TORADOL) 10 MG tablet Take 1 tablet (10 mg total) by mouth every 8 (eight) hours as needed for severe pain. 20 tablet 0  . metoprolol succinate (TOPROL-XL) 50 MG 24 hr tablet Take 1 tablet (50 mg total) by mouth daily. Take with or immediately following a meal. 30 tablet 11  . montelukast (SINGULAIR) 10 MG tablet Take 1  tablet (10 mg total) by mouth daily. 90 tablet 3  . oxyCODONE (OXY IR/ROXICODONE) 5 MG immediate release tablet Take 1 tablet (5 mg total) by mouth every 6 (six) hours as needed for severe pain ((score 4 to 6)). 60 tablet 0  . oxyCODONE-acetaminophen (PERCOCET/ROXICET) 5-325 MG tablet Take 1 tablet by mouth every 6 (six) hours as needed for severe pain. 6 tablet 0  . predniSONE (DELTASONE) 10 MG tablet   1  . predniSONE (STERAPRED UNI-PAK 48 TAB) 10 MG (48) TBPK tablet TK UTD  0  . pseudoephedrine (SUDAFED) 30 MG  tablet Take 60-120 mg by mouth daily as needed for congestion (pain).    . valACYclovir (VALTREX) 500 MG tablet Take 500 mg by mouth daily as needed (for breakouts). Reported on 12/20/2015    . VYVANSE 50 MG capsule Take 50 mg by mouth every morning.   0   No current facility-administered medications for this visit.    Facility-Administered Medications Ordered in Other Visits  Medication Dose Route Frequency Provider Last Rate Last Dose  . gadopentetate dimeglumine (MAGNEVIST) injection 18 mL  18 mL Intravenous Once PRN Penumalli, Earlean Polka, MD        Allergies: Imitrex [sumatriptan]; Sulfonamide derivatives; Reglan [metoclopramide]; Zofran [ondansetron hcl]; and Hibiclens [chlorhexidine]  Past Medical History:  Diagnosis Date  . Allergy    chroni rhinitis  . Anemia    low iron  . Anxiety   . Arthritis   . 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 Complication of anesthesia    last 2 surgeries had some tachycardia  . Depression   . Dysrhythmia    tachy occ  . Esophageal stricture   . GERD (gastroesophageal reflux disease)   . H/O hiatal hernia   . Heart murmur    MVP  . IBS (irritable bowel syndrome)   . Migraine   . MVP (mitral valve prolapse)   . Osteoarthritis   . Peptic ulcer disease   . Pneumonia    hx  . PONV (postoperative nausea and vomiting)   . S/P radiation therapy  06/28/2014-08/08/2014    1) Right Breast / 50 Gy in 25 fractions/ 2) Right Breast Boost / 10 Gy in 5 fractions  . Skin cancer    basal cell     Past Surgical History:  Procedure Laterality Date  . APPENDECTOMY    . BREAST LUMPECTOMY Bilateral   . BREAST LUMPECTOMY WITH NEEDLE LOCALIZATION Bilateral 05/02/2014   Procedure: BILATERIAL BREAST LUMPECTOMY WITH NEEDLE LOCALIZATION;  Surgeon: Shann Medal, MD;  Location: [facility];  Service: General;  Laterality: Bilateral;  . CERVIX LESION DESTRUCTION    . CESAREAN SECTION  2002  . CHOLECYSTECTOMY  11/29/2014    lap chole   . CHOLECYSTECTOMY N/A 11/29/2014   Procedure: LAPAROSCOPIC CHOLECYSTECTOMY WITH INTRAOPERATIVE CHOLANGIOGRAM;  Surgeon: Doreen Salvage, MD;  Location: [facility];  Service: General;  Laterality: N/A;  . COLONOSCOPY N/A 10/06/2014   Procedure: COLONOSCOPY;  Surgeon: Lafayette Dragon, MD;  Location: [facility];  Service: Endoscopy;  Laterality: N/A;  . COLPOSCOPY    . CYSTECTOMY     left breast,in 20's  . DILATION AND CURETTAGE OF UTERUS     x2  . ESOPHAGOGASTRODUODENOSCOPY N/A 10/06/2014   Procedure: ESOPHAGOGASTRODUODENOSCOPY (EGD);  Surgeon: Lafayette Dragon, MD;  Location: [facility];  Service: Endoscopy;  Laterality: N/A;  . HAND SURGERY     cancer removed basal cell  . LUMBAR  LAMINECTOMY/DECOMPRESSION MICRODISCECTOMY Left 11/14/2017   Procedure: DISCECTOMY LUMBAR Five - SACRAL - one, LEFT;  Surgeon: Ashok Pall, MD;  Location: [facility];  Service: Neurosurgery;  Laterality: Left;  MICRODISCECTOMY LUMBAR 5- SACRAL 1, LEFT  . LUMBAR LAMINECTOMY/DECOMPRESSION MICRODISCECTOMY Left 01/30/2018   Procedure: MICRODISCECTOMY LUMBAR 4- LUMBAR 5 LEFT;  Surgeon: Ashok Pall, MD;  Location: [facility];  Service: Neurosurgery;  Laterality: Left;  MICRODISCECTOMY LUMBAR 4- LUMBAR 5 LEFT  . NOVASURE ABLATION    . SHOULDER SURGERY Left    debridement ,bone spurs  . TONSILLECTOMY      Family History  Problem Relation Age of Onset  . Heart disease Father   . Hypertension Father   . Colon cancer Maternal Grandmother   . Depression Brother   . Diabetes Maternal Grandfather   . Heart disease Maternal Grandfather   . Colon cancer Maternal Uncle        x 2  . Colon polyps Unknown        x 9 maternal uncles/aunts  . Breast cancer Neg Hx     Social History   Tobacco Use  . Smoking status: Current Every Day Smoker    Packs/day: 1.00    Years: 10.00    Pack years: 10.00    Types: Cigarettes    Start date: 02/23/1998  . Smokeless tobacco: Never Used  Substance Use Topics  . Alcohol use: Yes     Alcohol/week: 3.0 oz    Types: 5 Glasses of wine per week    Comment: socially    Subjective:  Patient presents with concerns for worsening low back pain/ worsening numbness in left lower leg; accomapnied by her husband today; has had 2 back surgeries since February 2019; also complaining of persisting left hip pain; has seen her neurosurgeon about the persisting symptoms and has been scheduled for a nerve conduction study in 4 weeks; she is very frustrated about the care she has received from her neurosurgeon to this point; she would like a second opinion but needs recommendations for surgeon; admits she cannot tolerate the pain and numbness for another 4 weeks until nerve conduction study is done; no benefit with Oxycodone; limited benefit from prednisone; not currently taking Neurontin; wonders about need to start PT;   Objective:  Vitals:   03/12/18 1435  BP: 132/88  Pulse: (!) 101  Temp: 98 F (36.7 C)  TempSrc: Oral  SpO2: 99%  Weight: 218 lb 1.3 oz (98.9 kg)  Height: 5\' 4"  (1.626 m)    General: Well developed, well nourished, in no acute distress; tearful in office; Skin : Warm and dry.  Head: Normocephalic and atraumatic  Lungs: Respirations unlabored; clear to auscultation bilaterally without wheeze, rales, rhonchi  Musculoskeletal: No deformities; no active joint inflammation  Extremities: No edema, cyanosis, clubbing  Vessels: Symmetric bilaterally  Neurologic: Alert and oriented; speech intact; face symmetrical; moves all extremities well; CNII-XII intact without focal deficit; uses cane for mobility; Assessment:   1. HNP (herniated nucleus pulposus), lumbar   2. Neuropathy   3. Chronic low back pain with sciatica, sciatica laterality unspecified, unspecified back pain laterality     Plan:  Re-start Neurontin; will research options for 2nd opinion; keep planned appt for nerve conduction; follow-up to be determined.  Spent 30 minutes with patient; greater than 50% spent  in counseling;   No follow-ups on file.  No orders of the defined types were placed in this encounter.   Requested Prescriptions   Signed Prescriptions Disp Refills  .  gabapentin (NEURONTIN) 100 MG capsule 180 capsule 3    Sig: Take 1-2 capsules (100-200 mg total) by mouth 3 (three) times daily.

## 2018-03-13 ENCOUNTER — Encounter: Payer: Self-pay | Admitting: Family

## 2018-03-18 ENCOUNTER — Other Ambulatory Visit: Payer: Self-pay | Admitting: Family

## 2018-03-18 DIAGNOSIS — M79605 Pain in left leg: Secondary | ICD-10-CM

## 2018-04-01 DIAGNOSIS — M5416 Radiculopathy, lumbar region: Secondary | ICD-10-CM | POA: Diagnosis not present

## 2018-04-02 ENCOUNTER — Encounter: Payer: Self-pay | Admitting: Internal Medicine

## 2018-04-06 ENCOUNTER — Other Ambulatory Visit: Payer: Self-pay | Admitting: Internal Medicine

## 2018-04-06 DIAGNOSIS — M5126 Other intervertebral disc displacement, lumbar region: Secondary | ICD-10-CM

## 2018-04-27 ENCOUNTER — Other Ambulatory Visit: Payer: Self-pay | Admitting: Family

## 2018-04-27 ENCOUNTER — Telehealth: Payer: Self-pay | Admitting: Internal Medicine

## 2018-04-27 ENCOUNTER — Encounter: Payer: Self-pay | Admitting: Family

## 2018-04-27 ENCOUNTER — Encounter: Payer: Self-pay | Admitting: Internal Medicine

## 2018-04-27 MED ORDER — VALACYCLOVIR HCL 500 MG PO TABS: 500.0000 mg | ORAL_TABLET | Freq: Two times a day (BID) | ORAL | 1 refills | Status: DC

## 2018-04-27 MED ORDER — ACYCLOVIR 400 MG PO TABS: 400.0000 mg | ORAL_TABLET | Freq: Every day | ORAL | 0 refills | Status: DC

## 2018-04-27 MED ORDER — ACYCLOVIR 5 % EX CREA: 1.0000 "application " | TOPICAL_CREAM | CUTANEOUS | 0 refills | Status: DC

## 2018-04-27 NOTE — Telephone Encounter (Signed)
Please advise. Thanks.  

## 2018-04-27 NOTE — Telephone Encounter (Signed)
Copied from [place] [id number]. Topic: Quick Communication - Rx Refill/Question >> Apr 27, 2018 11:11 AM Alfredia Ferguson R wrote: Medication: valACYclovir (VALTREX) 500 MG tablet   Has the patient contacted their pharmacy? Yes   Preferred Pharmacy (with phone number or street name): [facility] [address], [address] [place] [phone] (Phone) [phone] (Fax)      Agent: Please be advised that RX refills may take up to 3 business days. We ask that you follow-up with your pharmacy.

## 2018-04-28 NOTE — Telephone Encounter (Signed)
Filled 04/27/18 at preferred RX.

## 2018-05-06 ENCOUNTER — Telehealth: Payer: Self-pay

## 2018-05-06 NOTE — Telephone Encounter (Signed)
PA started on CoverMyMeds KEY: [id number]

## 2018-05-14 ENCOUNTER — Encounter: Payer: Self-pay | Admitting: Family

## 2018-05-14 DIAGNOSIS — M545 Low back pain: Secondary | ICD-10-CM | POA: Diagnosis not present

## 2018-05-14 DIAGNOSIS — M5416 Radiculopathy, lumbar region: Secondary | ICD-10-CM | POA: Diagnosis not present

## 2018-05-16 ENCOUNTER — Other Ambulatory Visit: Payer: Self-pay

## 2018-05-16 ENCOUNTER — Encounter (HOSPITAL_COMMUNITY): Payer: Self-pay

## 2018-05-16 ENCOUNTER — Emergency Department (HOSPITAL_COMMUNITY)
Admission: EM | Admit: 2018-05-16 | Discharge: 2018-05-16 | Disposition: A | Discharge status: Home / Self Care | Payer: 59 | Attending: Emergency Medicine | Admitting: Emergency Medicine

## 2018-05-16 DIAGNOSIS — F17210 Nicotine dependence, cigarettes, uncomplicated: Secondary | ICD-10-CM | POA: Insufficient documentation

## 2018-05-16 DIAGNOSIS — R600 Localized edema: Secondary | ICD-10-CM | POA: Insufficient documentation

## 2018-05-16 DIAGNOSIS — Z79899 Other long term (current) drug therapy: Secondary | ICD-10-CM | POA: Insufficient documentation

## 2018-05-16 DIAGNOSIS — R609 Edema, unspecified: Secondary | ICD-10-CM

## 2018-05-16 DIAGNOSIS — Z853 Personal history of malignant neoplasm of breast: Secondary | ICD-10-CM | POA: Insufficient documentation

## 2018-05-16 NOTE — ED Notes (Signed)
EDP at bedside  

## 2018-05-16 NOTE — ED Triage Notes (Signed)
Onset 1 hour ago pt noticed swelling in bilateral LE and feet swelling.  Pt states "it feels like a dripping sensation in legs".  Also, c/o left arm "feels funny, weird".

## 2018-05-16 NOTE — ED Provider Notes (Signed)
[facility] EMERGENCY DEPARTMENT Provider Note   CSN: [id number] Arrival date & time: 05/16/18  1335     History   Chief Complaint Chief Complaint  Patient presents with  . Leg Swelling    HPI Tamara Cross is a 52 y.o. 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 2019 who peripheral neuropathy who presents to the emergency department with a chief complaint of bilateral ankle and foot swelling.  Patient reports swelling to her right ankle and foot that she first noticed when she awoke this morning.  She reports the swelling has been improving since onset.  She reports that her husband advised her to take 2 tablets of Benadryl to help with the swelling, but she took prior to arrival.  She also endorses some swelling of the left ankle that also began this morning that has since resolved.  She states that she also felt like there was a dripping sensation that was moving down her calves earlier today that lasted for a few minutes before resolving.  No history of similar.  She reports that she has been extremely anxious since turning 34 about her medical history.  She states that she was concerned that she was having a heart attack because her friends have told her that the swelling in the legs can mean that you are having a heart attack.  She also reports that she had some tingling in her left forearm for several minutes while she was driving from [place] to the hospital that has since resolved.  She denies chest pain, dyspnea, diaphoresis, nausea, vomiting, abdominal pain, fever, chills, calf or thigh pain, ankle or knee pain, numbness or weakness in the bilateral lower extremities.  No recent falls or injuries.  No new exercises or activities.  She has not spent any time outside.  No recent insect bites.  She has a history of peripheral  neuropathy and is already on gabapentin for sciatica.  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  She does not take OCPs.  No recent long travel.  No history of DVT or PE.  No history of gout.  No recent surgery or immobilization.  No active CA.  The history is provided by the patient. No language interpreter was used.    Past Medical History:  Diagnosis Date  . Allergy    chroni rhinitis  . Anemia    low iron  . Anxiety   . Arthritis   . 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 Complication of anesthesia    last 2 surgeries had some tachycardia  . Depression   . Dysrhythmia    tachy occ  . Esophageal stricture   . GERD (gastroesophageal reflux disease)   . H/O hiatal hernia   . Heart murmur    MVP  . IBS (irritable bowel syndrome)   . Migraine   . MVP (mitral valve prolapse)   . Osteoarthritis   . Peptic ulcer disease   . Pneumonia    hx  . PONV (postoperative nausea and vomiting)   . S/P radiation therapy  06/28/2014-08/08/2014    1) Right Breast / 50 Gy in 25 fractions/ 2) Right Breast Boost / 10 Gy in 5 fractions  . Skin cancer    basal  cell     Patient Active Problem List   Diagnosis Date Noted  . Knee contusion 02/04/2018  . HNP (herniated nucleus pulposus), lumbar 11/14/2017  . Sciatic leg pain 10/21/2017  . Sinusitis, chronic 10/15/2016  . Asthmatic bronchitis with acute exacerbation 04/03/2016  . Acute URI 12/20/2015  . Ataxia 05/30/2015  . Liver lesion, left lobe 01/03/2015  . Gallstone pancreatitis 11/28/2014  . Dysphagia, pharyngoesophageal phase 10/06/2014  . Special screening for malignant neoplasm of colon 10/06/2014  . Dysphagia   . Special screening for malignant neoplasms, colon   . Arthralgia 09/12/2014  . Submuscular lipoma of chest 09/12/2014  . Leg pain 08/29/2014  . Hot flashes due to tamoxifen 08/29/2014  . Low back pain 04/26/2014  . Rash and nonspecific skin  eruption 04/26/2014  . Malignant neoplasm of upper-outer quadrant of right female breast ([place]) 04/13/2014  . Palpitations 11/18/2013  . Allergy   . Heart murmur   . HIP PAIN 09/07/2010  . PEDAL EDEMA 04/26/2010  . EXTERNAL HEMORRHOIDS 01/24/2010  . HIATAL HERNIA 01/24/2010  . Reflux esophagitis 12/19/2009  . GERD 12/19/2009  . Irritable bowel syndrome 12/19/2009  . 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  . Migraine 08/23/2008  . ESOPHAGEAL STRICTURE 03/11/2008  . POLYARTHRALGIA 03/11/2008  . Obesity 05/01/2007  . ANXIETY 05/01/2007  . Chronic depressive disorder 05/01/2007  . PREMATURE VENTRICULAR CONTRACTIONS 05/01/2007  . Allergic rhinitis 05/01/2007  . MITRAL VALVE PROLAPSE, HX OF 05/01/2007  . PUD, HX OF 05/01/2007    Past Surgical History:  Procedure Laterality Date  . APPENDECTOMY    . BREAST LUMPECTOMY Bilateral   . BREAST LUMPECTOMY WITH NEEDLE LOCALIZATION Bilateral 05/02/2014   Procedure: BILATERIAL BREAST LUMPECTOMY WITH NEEDLE LOCALIZATION;  Surgeon: Shann Medal, MD;  Location: [facility];  Service: General;  Laterality: Bilateral;  . CERVIX LESION DESTRUCTION    . CESAREAN SECTION  2002  . CHOLECYSTECTOMY  11/29/2014   lap chole   . CHOLECYSTECTOMY N/A 11/29/2014   Procedure: LAPAROSCOPIC CHOLECYSTECTOMY WITH INTRAOPERATIVE CHOLANGIOGRAM;  Surgeon: Doreen Salvage, MD;  Location: [facility];  Service: General;  Laterality: N/A;  . COLONOSCOPY N/A 10/06/2014   Procedure: COLONOSCOPY;  Surgeon: [place] Dragon, MD;  Location: [facility];  Service: Endoscopy;  Laterality: N/A;  . COLPOSCOPY    . CYSTECTOMY     left breast,in 20's  . DILATION AND CURETTAGE OF UTERUS     x2  . ESOPHAGOGASTRODUODENOSCOPY N/A 10/06/2014   Procedure: ESOPHAGOGASTRODUODENOSCOPY (EGD);  Surgeon: [place] Dragon, MD;  Location: [facility];  Service: Endoscopy;  Laterality: N/A;  . HAND SURGERY     cancer removed basal cell  . LUMBAR LAMINECTOMY/DECOMPRESSION MICRODISCECTOMY Left 11/14/2017   Procedure: DISCECTOMY  LUMBAR Five - SACRAL - one, LEFT;  Surgeon: Ashok Pall, MD;  Location: [facility];  Service: Neurosurgery;  Laterality: Left;  MICRODISCECTOMY LUMBAR 5- SACRAL 1, LEFT  . LUMBAR LAMINECTOMY/DECOMPRESSION MICRODISCECTOMY Left 01/30/2018   Procedure: MICRODISCECTOMY LUMBAR 4- LUMBAR 5 LEFT;  Surgeon: Ashok Pall, MD;  Location: [facility];  Service: Neurosurgery;  Laterality: Left;  MICRODISCECTOMY LUMBAR 4- LUMBAR 5 LEFT  . NOVASURE ABLATION    . SHOULDER SURGERY Left    debridement ,bone spurs  . TONSILLECTOMY       OB History    Gravida  1   Para  1   Term      Preterm      AB      Living        SAB  TAB      Ectopic      Multiple      Live Births           Obstetric Comments  She is not on hormones.  She had her last menstrual period around 2013.         Home Medications    Prior to Admission medications   Medication Sig Start Date End Date Taking? Authorizing Provider  acetaminophen (TYLENOL) 500 MG tablet Take 1,000 mg by mouth daily as needed for mild pain.    [provider]  acyclovir (ZOVIRAX) 400 MG tablet Take 1 tablet (400 mg total) by mouth 5 (five) times daily. Use until flare resolves 04/27/18   Marrian Salvage, FNP  acyclovir cream (ZOVIRAX) 5 % Apply 1 application topically every 4 (four) hours. 04/27/18   Marrian Salvage, FNP  albuterol (PROVENTIL HFA;VENTOLIN HFA) 108 (90 Base) MCG/ACT inhaler Inhale 1-2 puffs into the lungs every 6 (six) hours as needed for wheezing or shortness of breath. Patient taking differently: Inhale 2 puffs into the lungs every 6 (six) hours as needed for wheezing or shortness of breath.  07/31/17   Plotnikov, Evie Lacks, MD  b complex vitamins tablet Take 1 tablet by mouth daily.    [provider]  budesonide-formoterol (SYMBICORT) 160-4.5 MCG/ACT inhaler Inhale 2 puffs into the lungs 2 (two) times daily. 07/30/16   Plotnikov, Evie Lacks, MD  cetirizine (ZYRTEC) 10 MG tablet Take 10 mg by  mouth daily.    [provider]  Cholecalciferol (VITAMIN D3) 50000 units CAPS Take 1 capsule by mouth once a week. 02/04/18   Plotnikov, Evie Lacks, MD  cyclobenzaprine (FLEXERIL) 10 MG tablet TK 1 T PO TID PRN FOR SPASMS 01/31/18   [provider]  desvenlafaxine (PRISTIQ) 100 MG 24 hr tablet Take 100 mg by mouth daily.      [provider]  fluticasone (FLONASE) 50 MCG/ACT nasal spray Place 2 sprays into both nostrils daily. 12/22/17   Maczis, Barth Kirks, PA-C  gabapentin (NEURONTIN) 100 MG capsule Take 1-2 capsules (100-200 mg total) by mouth 3 (three) times daily. 03/12/18   Marrian Salvage, FNP  ibuprofen (ADVIL,MOTRIN) 600 MG tablet Take 1 tablet (600 mg total) by mouth every 6 (six) hours as needed. Patient taking differently: Take 600 mg by mouth every 6 (six) hours as needed for moderate pain.  10/09/16   Horton, Barbette Hair, MD  ketorolac (TORADOL) 10 MG tablet Take 1 tablet (10 mg total) by mouth every 8 (eight) hours as needed for severe pain. 02/19/18   Plotnikov, Evie Lacks, MD  metoprolol succinate (TOPROL-XL) 50 MG 24 hr tablet Take 1 tablet (50 mg total) by mouth daily. Take with or immediately following a meal. 02/04/18   Plotnikov, Evie Lacks, MD  montelukast (SINGULAIR) 10 MG tablet Take 1 tablet (10 mg total) by mouth daily. 10/21/17 10/21/18  Plotnikov, Evie Lacks, MD  oxyCODONE (OXY IR/ROXICODONE) 5 MG immediate release tablet Take 1 tablet (5 mg total) by mouth every 6 (six) hours as needed for severe pain ((score 4 to 6)). 02/19/18   Plotnikov, Evie Lacks, MD  oxyCODONE-acetaminophen (PERCOCET/ROXICET) 5-325 MG tablet Take 1 tablet by mouth every 6 (six) hours as needed for severe pain. 02/09/18   Petrucelli, Aldona Bar R, PA-C  predniSONE (DELTASONE) 10 MG tablet  02/25/18   [provider]  predniSONE (STERAPRED UNI-PAK 48 TAB) 10 MG (48) TBPK tablet TK UTD 01/31/18   [provider]  pseudoephedrine (SUDAFED) 30 MG tablet Take 60-120 mg by mouth  daily as needed for congestion (pain).    [provider]  valACYclovir (VALTREX) 500 MG tablet Take 1 tablet (500 mg total) by mouth 2 (two) times daily. Use for 5 days as directed with outbreaks 04/27/18   Marrian Salvage, FNP  VYVANSE 50 MG capsule Take 50 mg by mouth every morning.  11/06/14   [provider]    Family History Family History  Problem Relation Age of Onset  . Heart disease Father   . Hypertension Father   . Colon cancer Maternal Grandmother   . Depression Brother   . Diabetes Maternal Grandfather   . Heart disease Maternal Grandfather   . Colon cancer Maternal Uncle        x 2  . Colon polyps Unknown        x 9 maternal uncles/aunts  . Breast cancer Neg Hx     Social History Social History   Tobacco Use  . Smoking status: Current Every Day Smoker    Packs/day: 1.00    Years: 10.00    Pack years: 10.00    Types: Cigarettes    Start date: 02/23/1998  . Smokeless tobacco: Never Used  Substance Use Topics  . Alcohol use: Yes    Alcohol/week: 5.0 standard drinks    Types: 5 Glasses of wine per week    Comment: socially  . Drug use: No    Comment: quit for 15 yrs smoked for 15 started again 1 yr     Allergies   Imitrex [sumatriptan]; Sulfonamide derivatives; Reglan [metoclopramide]; Zofran [ondansetron hcl]; and Hibiclens [chlorhexidine]   Review of Systems Review of Systems  Constitutional: Negative for activity change, chills and fever.  Respiratory: Negative for cough, shortness of breath and wheezing.   Cardiovascular: Positive for leg swelling. Negative for chest pain and palpitations.  Gastrointestinal: Negative for abdominal pain, nausea and vomiting.  Genitourinary: Negative for dysuria.  Musculoskeletal: Negative for arthralgias, back pain, gait problem, joint swelling, myalgias, neck pain and neck stiffness.  Skin: Negative for rash.  Allergic/Immunologic: Negative for immunocompromised state.  Neurological:  Positive for numbness (intermittent, resolved). Negative for weakness and headaches.  Psychiatric/Behavioral: Negative for confusion.   Physical Exam Updated Vital Signs BP (!) 133/97   Pulse 96   Temp 98.5 F (36.9 C) (Oral)   Resp (!) 24   LMP 04/11/2012   SpO2 96%   Physical Exam  Constitutional: No distress.  Anxious appearing  HENT:  Head: Normocephalic.  Eyes: Conjunctivae are normal.  Neck: Neck supple.  Cardiovascular: Normal rate, regular rhythm, normal heart sounds and intact distal pulses. Exam reveals no gallop and no friction rub.  No murmur heard. Pulmonary/Chest: Effort normal. No stridor. No respiratory distress. She has no wheezes. She has no rales. She exhibits no tenderness.  Abdominal: Soft. She exhibits no distension.  Musculoskeletal: She exhibits edema. She exhibits no deformity.  Mild pitting edema over the right lateral ankle that extends to the dorsum of the right foot. Minimal edema of the bilateral calves.  No overlying erythema or warmth.  5 out of 5 strength against resistance of the bilateral lower extremities with dorsiflexion plantarflexion.  Capillary refill is less than 2 seconds in all digits of the bilateral feet.  Sensation is intact and equal throughout the bilateral lower extremities.  Symmetric tandem gait.  No overlying ecchymosis, abrasions, or wounds.  Full active and passive range of motion of the bilateral ankles  and knees.  Bilateral knees and ankles are nontender to palpation.  5 out of 5 strength against resistance of the bilateral upper extremities.  Sensation is intact and equal throughout.  Full active and passive range of motion of all joints of bilateral upper extremities.  No tenderness to the cervical, thoracic, or lumbar spinous processes.  Neurological: She is alert.  Skin: Skin is warm. Capillary refill takes less than 2 seconds. No rash noted.  Psychiatric: Her behavior is normal. Her mood appears anxious.  Nursing note  and vitals reviewed.  ED Treatments / Results  Labs (all labs ordered are listed, but only abnormal results are displayed) Labs Reviewed - No data to display  EKG None  Radiology No results found.  Procedures Procedures (including critical care time)  Medications Ordered in ED Medications - No data to display   Initial Impression / Assessment and Plan / ED Course  I have reviewed the triage vital signs and the nursing notes.  Pertinent labs & imaging results that were available during my care of the patient were reviewed by me and considered in my medical decision making (see chart for details).     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 2019 who presents with swelling to the bilateral feet and ankles, onset this morning.  The patient was seen and evaluated by myself and Dr. Lacinda Axon, attending physician.  Patient reports improvement of symptoms since onset.  On exam, she is very anxious appearing.  She is neurovascularly intact and ambulatory without difficulty.  No overlying erythema or warmth.  Doubt cellulitis, gout, or septic joint.  Lungs are clear to auscultation bilaterally heart is regular rate and rhythm.  Doubt acute congestive heart failure.  She has mild pitting edema to the lateral aspect of the right foot that extends to the dorsum of the right foot.   She also mentions she has some intermittent tingling in her left forearm while driving that resolved after she stopped driving.  Normal exam of the bilateral upper extremities.  After reviewing her medical record, it appears that she is previously been diagnosed with pedal edema, she also has a history of peripheral neuropathy for which she is taking gabapentin.  Shared decision making conversation regarding imaging of the right foot and ankle, which she declines that she  has had no recent trauma or injury.  Encouraged supportive care including RICE therapy and compression stockings as well as following up with her PCP.  Strict return precautions given.  She is hemodynamically stable and in no acute distress.  She is safe for discharge home with outpatient follow-up at this time.   Final Clinical Impressions(s) / ED Diagnoses   Final diagnoses:  Peripheral edema    ED Discharge Orders    None       Tamara Gavel, PA-C 05/16/18 1540    Nat Christen, MD 05/17/18 1636

## 2018-05-16 NOTE — ED Notes (Signed)
Patient verbalizes understanding of discharge instructions. Opportunity for questioning and answers were provided. Armband removed by staff, pt discharged from ED.  

## 2018-05-16 NOTE — Discharge Instructions (Signed)
Thank you for allowing me to care for you today in the Emergency Department.   You can follow-up with your primary care provider if you continue to have swelling in your ankles.  There are many things that could cause worsening swelling in your ankle, including activity and diet.  Elevate your legs above the level of your heart.  You can apply ice for 15 to 20 minutes to help with swelling.  Wearing compression stockings can also help with swelling.  You should return to the emergency department if your legs get red and hot to the touch, if you develop a fever chills, if your joints get red and hot to the touch, or if you develop swelling in your legs with chest pain and shortness of breath.

## 2018-05-28 DIAGNOSIS — H524 Presbyopia: Secondary | ICD-10-CM | POA: Diagnosis not present

## 2018-06-18 DIAGNOSIS — M5416 Radiculopathy, lumbar region: Secondary | ICD-10-CM | POA: Diagnosis not present

## 2018-07-13 ENCOUNTER — Other Ambulatory Visit: Payer: Self-pay | Admitting: Family

## 2018-07-21 DIAGNOSIS — M5116 Intervertebral disc disorders with radiculopathy, lumbar region: Secondary | ICD-10-CM | POA: Diagnosis not present

## 2018-07-29 DIAGNOSIS — J45909 Unspecified asthma, uncomplicated: Secondary | ICD-10-CM | POA: Diagnosis not present

## 2018-07-29 DIAGNOSIS — M5416 Radiculopathy, lumbar region: Secondary | ICD-10-CM | POA: Diagnosis not present

## 2018-07-29 DIAGNOSIS — Z853 Personal history of malignant neoplasm of breast: Secondary | ICD-10-CM | POA: Diagnosis not present

## 2018-07-29 DIAGNOSIS — Z9889 Other specified postprocedural states: Secondary | ICD-10-CM | POA: Insufficient documentation

## 2018-08-18 ENCOUNTER — Other Ambulatory Visit: Payer: Self-pay | Admitting: Family

## 2018-08-24 MED ORDER — VALACYCLOVIR HCL 500 MG PO TABS: 500.0000 mg | ORAL_TABLET | Freq: Every day | ORAL | 11 refills | Status: DC

## 2018-08-31 ENCOUNTER — Encounter (HOSPITAL_COMMUNITY): Payer: Self-pay | Admitting: Emergency Medicine

## 2018-08-31 ENCOUNTER — Emergency Department (HOSPITAL_COMMUNITY)
Admission: EM | Admit: 2018-08-31 | Discharge: 2018-08-31 | Disposition: A | Discharge status: Home / Self Care | Payer: 59 | Attending: Emergency Medicine | Admitting: Emergency Medicine

## 2018-08-31 ENCOUNTER — Other Ambulatory Visit: Payer: Self-pay

## 2018-08-31 DIAGNOSIS — Y9389 Activity, other specified: Secondary | ICD-10-CM | POA: Insufficient documentation

## 2018-08-31 DIAGNOSIS — Z23 Encounter for immunization: Secondary | ICD-10-CM | POA: Diagnosis not present

## 2018-08-31 DIAGNOSIS — Y929 Unspecified place or not applicable: Secondary | ICD-10-CM | POA: Diagnosis not present

## 2018-08-31 DIAGNOSIS — Z85828 Personal history of other malignant neoplasm of skin: Secondary | ICD-10-CM | POA: Insufficient documentation

## 2018-08-31 DIAGNOSIS — F17210 Nicotine dependence, cigarettes, uncomplicated: Secondary | ICD-10-CM | POA: Insufficient documentation

## 2018-08-31 DIAGNOSIS — S61213A Laceration without foreign body of left middle finger without damage to nail, initial encounter: Secondary | ICD-10-CM | POA: Diagnosis not present

## 2018-08-31 DIAGNOSIS — Z79899 Other long term (current) drug therapy: Secondary | ICD-10-CM | POA: Diagnosis not present

## 2018-08-31 DIAGNOSIS — Z853 Personal history of malignant neoplasm of breast: Secondary | ICD-10-CM | POA: Diagnosis not present

## 2018-08-31 DIAGNOSIS — S6992XA Unspecified injury of left wrist, hand and finger(s), initial encounter: Secondary | ICD-10-CM | POA: Diagnosis present

## 2018-08-31 DIAGNOSIS — Y999 Unspecified external cause status: Secondary | ICD-10-CM | POA: Insufficient documentation

## 2018-08-31 DIAGNOSIS — W268XXA Contact with other sharp object(s), not elsewhere classified, initial encounter: Secondary | ICD-10-CM | POA: Insufficient documentation

## 2018-08-31 MED ORDER — LIDOCAINE HCL (PF) 1 % IJ SOLN
5.0000 mL | Freq: Once | INTRAMUSCULAR | Status: AC
  Administered 2018-08-31: 5 mL
  Filled 2018-08-31: qty 5

## 2018-08-31 MED ORDER — TETANUS-DIPHTH-ACELL PERTUSSIS 5-2.5-18.5 LF-MCG/0.5 IM SUSP
0.5000 mL | Freq: Once | INTRAMUSCULAR | Status: AC
Start: 2018-08-31 — End: 2018-08-31
  Administered 2018-08-31: 0.5 mL via INTRAMUSCULAR
  Filled 2018-08-31: qty 0.5

## 2018-08-31 NOTE — ED Provider Notes (Signed)
[facility] EMERGENCY DEPARTMENT Provider Note   CSN: [id number] Arrival date & time: 08/31/18  2111     History   Chief Complaint No chief complaint on file.   HPI Tamara Cross is a 52 y.o. female presents to the Emergency Department complaining of acute, persistent, left middle finger laceration onset 6am this morning. Pt reports she cut her left middle finger knuckle with a pair of sharp crafting scissors.  She reports persistent bleeding throughout the day.  Pt is not taking blood thinners, antiplatelets or ASA.  Pt denies numbness, tingling or weakness in the finger or hand.  Movement of the finger seems to make the bleeding worse.  Pressure makes it better.  Pt denies hx of HIV, DM or immunocompromise.     The history is provided by the patient and medical records. No language interpreter was used.    Past Medical History:  Diagnosis Date  . Allergy    chroni rhinitis  . Anemia    low iron  . Anxiety   . Arthritis   . 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 Complication of anesthesia    last 2 surgeries had some tachycardia  . Depression   . Dysrhythmia    tachy occ  . Esophageal stricture   . GERD (gastroesophageal reflux disease)   . H/O hiatal hernia   . Heart murmur    MVP  . IBS (irritable bowel syndrome)   . Migraine   . MVP (mitral valve prolapse)   . Osteoarthritis   . Peptic ulcer disease   . Pneumonia    hx  . PONV (postoperative nausea and vomiting)   . S/P radiation therapy  06/28/2014-08/08/2014    1) Right Breast / 50 Gy in 25 fractions/ 2) Right Breast Boost / 10 Gy in 5 fractions  . Skin cancer    basal cell     Patient Active Problem List   Diagnosis Date Noted  . Knee contusion 02/04/2018  . HNP (herniated nucleus pulposus), lumbar 11/14/2017  . Sciatic leg pain 10/21/2017  . Sinusitis, chronic 10/15/2016  . Asthmatic bronchitis with acute exacerbation 04/03/2016  .  Acute URI 12/20/2015  . Ataxia 05/30/2015  . Liver lesion, left lobe 01/03/2015  . Gallstone pancreatitis 11/28/2014  . Dysphagia, pharyngoesophageal phase 10/06/2014  . Special screening for malignant neoplasm of colon 10/06/2014  . Dysphagia   . Special screening for malignant neoplasms, colon   . Arthralgia 09/12/2014  . Submuscular lipoma of chest 09/12/2014  . Leg pain 08/29/2014  . Hot flashes due to tamoxifen 08/29/2014  . Low back pain 04/26/2014  . Rash and nonspecific skin eruption 04/26/2014  . Malignant neoplasm of upper-outer quadrant of right female breast ([place]) 04/13/2014  . Palpitations 11/18/2013  . Allergy   . Heart murmur   . HIP PAIN 09/07/2010  . PEDAL EDEMA 04/26/2010  . EXTERNAL HEMORRHOIDS 01/24/2010  . HIATAL HERNIA 01/24/2010  . Reflux esophagitis 12/19/2009  . GERD 12/19/2009  . Irritable bowel syndrome 12/19/2009  . 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  . Migraine 08/23/2008  . ESOPHAGEAL STRICTURE 03/11/2008  . POLYARTHRALGIA 03/11/2008  . Obesity 05/01/2007  . ANXIETY 05/01/2007  . Chronic depressive disorder 05/01/2007  . PREMATURE VENTRICULAR CONTRACTIONS 05/01/2007  . Allergic rhinitis 05/01/2007  . MITRAL VALVE PROLAPSE, HX OF 05/01/2007  . PUD, HX OF 05/01/2007    Past Surgical History:  Procedure Laterality Date  . APPENDECTOMY    .  BREAST LUMPECTOMY Bilateral   . BREAST LUMPECTOMY WITH NEEDLE LOCALIZATION Bilateral 05/02/2014   Procedure: BILATERIAL BREAST LUMPECTOMY WITH NEEDLE LOCALIZATION;  Surgeon: Shann Medal, MD;  Location: [place];  Service: General;  Laterality: Bilateral;  . CERVIX LESION DESTRUCTION    . CESAREAN SECTION  2002  . CHOLECYSTECTOMY  11/29/2014   lap chole   . CHOLECYSTECTOMY N/A 11/29/2014   Procedure: LAPAROSCOPIC CHOLECYSTECTOMY WITH INTRAOPERATIVE CHOLANGIOGRAM;  Surgeon: Doreen Salvage, MD;  Location: [place];  Service: General;  Laterality: N/A;  . COLONOSCOPY N/A 10/06/2014   Procedure: COLONOSCOPY;  Surgeon: Lafayette Dragon, MD;  Location: [facility];  Service: Endoscopy;  Laterality: N/A;  . COLPOSCOPY    . CYSTECTOMY     left breast,in 20's  . DILATION AND CURETTAGE OF UTERUS     x2  . ESOPHAGOGASTRODUODENOSCOPY N/A 10/06/2014   Procedure: ESOPHAGOGASTRODUODENOSCOPY (EGD);  Surgeon: Lafayette Dragon, MD;  Location: [facility];  Service: Endoscopy;  Laterality: N/A;  . HAND SURGERY     cancer removed basal cell  . LUMBAR LAMINECTOMY/DECOMPRESSION MICRODISCECTOMY Left 11/14/2017   Procedure: DISCECTOMY LUMBAR Five - SACRAL - one, LEFT;  Surgeon: Ashok Pall, MD;  Location: [place];  Service: Neurosurgery;  Laterality: Left;  MICRODISCECTOMY LUMBAR 5- SACRAL 1, LEFT  . LUMBAR LAMINECTOMY/DECOMPRESSION MICRODISCECTOMY Left 01/30/2018   Procedure: MICRODISCECTOMY LUMBAR 4- LUMBAR 5 LEFT;  Surgeon: Ashok Pall, MD;  Location: [place];  Service: Neurosurgery;  Laterality: Left;  MICRODISCECTOMY LUMBAR 4- LUMBAR 5 LEFT  . NOVASURE ABLATION    . SHOULDER SURGERY Left    debridement ,bone spurs  . TONSILLECTOMY       OB History    Gravida  1   Para  1   Term      Preterm      AB      Living        SAB      TAB      Ectopic      Multiple      Live Births           Obstetric Comments  She is not on hormones.  She had her last menstrual period around 2013.         Home Medications    Prior to Admission medications   Medication Sig Start Date End Date Taking? Authorizing Provider  acetaminophen (TYLENOL) 500 MG tablet Take 1,000 mg by mouth daily as needed for mild pain.    [provider]  acyclovir (ZOVIRAX) 400 MG tablet Take 1 tablet (400 mg total) by mouth 5 (five) times daily. Use until flare resolves 04/27/18   Marrian Salvage, FNP  acyclovir cream (ZOVIRAX) 5 % Apply 1 application topically every 4 (four) hours. 04/27/18   Marrian Salvage, FNP  albuterol (PROVENTIL HFA;VENTOLIN HFA) 108 (90 Base) MCG/ACT inhaler Inhale 1-2 puffs into the lungs every 6  (six) hours as needed for wheezing or shortness of breath. Patient taking differently: Inhale 2 puffs into the lungs every 6 (six) hours as needed for wheezing or shortness of breath.  07/31/17   Plotnikov, Evie Lacks, MD  b complex vitamins tablet Take 1 tablet by mouth daily.    [provider]  budesonide-formoterol (SYMBICORT) 160-4.5 MCG/ACT inhaler Inhale 2 puffs into the lungs 2 (two) times daily. 07/30/16   Plotnikov, Evie Lacks, MD  cetirizine (ZYRTEC) 10 MG tablet Take 10 mg by mouth daily.    [provider]  Cholecalciferol (VITAMIN D3) 50000  units CAPS Take 1 capsule by mouth once a week. 02/04/18   Plotnikov, Evie Lacks, MD  cyclobenzaprine (FLEXERIL) 10 MG tablet TK 1 T PO TID PRN FOR SPASMS 01/31/18   [provider]  desvenlafaxine (PRISTIQ) 100 MG 24 hr tablet Take 100 mg by mouth daily.      [provider]  fluticasone (FLONASE) 50 MCG/ACT nasal spray Place 2 sprays into both nostrils daily. 12/22/17   Maczis, Barth Kirks, PA-C  gabapentin (NEURONTIN) 100 MG capsule Take 1-2 capsules (100-200 mg total) by mouth 3 (three) times daily. 03/12/18   Marrian Salvage, FNP  ibuprofen (ADVIL,MOTRIN) 600 MG tablet Take 1 tablet (600 mg total) by mouth every 6 (six) hours as needed. Patient taking differently: Take 600 mg by mouth every 6 (six) hours as needed for moderate pain.  10/09/16   Horton, Barbette Hair, MD  ketorolac (TORADOL) 10 MG tablet Take 1 tablet (10 mg total) by mouth every 8 (eight) hours as needed for severe pain. 02/19/18   Plotnikov, Evie Lacks, MD  metoprolol succinate (TOPROL-XL) 50 MG 24 hr tablet Take 1 tablet (50 mg total) by mouth daily. Take with or immediately following a meal. 02/04/18   Plotnikov, Evie Lacks, MD  montelukast (SINGULAIR) 10 MG tablet Take 1 tablet (10 mg total) by mouth daily. 10/21/17 10/21/18  Plotnikov, Evie Lacks, MD  oxyCODONE (OXY IR/ROXICODONE) 5 MG immediate release tablet Take 1 tablet (5 mg total) by mouth every 6  (six) hours as needed for severe pain ((score 4 to 6)). 02/19/18   Plotnikov, Evie Lacks, MD  oxyCODONE-acetaminophen (PERCOCET/ROXICET) 5-325 MG tablet Take 1 tablet by mouth every 6 (six) hours as needed for severe pain. 02/09/18   Petrucelli, Aldona Bar R, PA-C  predniSONE (DELTASONE) 10 MG tablet  02/25/18   [provider]  predniSONE (STERAPRED UNI-PAK 48 TAB) 10 MG (48) TBPK tablet TK UTD 01/31/18   [provider]  pseudoephedrine (SUDAFED) 30 MG tablet Take 60-120 mg by mouth daily as needed for congestion (pain).    [provider]  valACYclovir (VALTREX) 500 MG tablet Take 1 tablet (500 mg total) by mouth daily. 08/24/18   Plotnikov, Evie Lacks, MD  VYVANSE 50 MG capsule Take 50 mg by mouth every morning.  11/06/14   [provider]    Family History Family History  Problem Relation Age of Onset  . Heart disease Father   . Hypertension Father   . Colon cancer Maternal Grandmother   . Depression Brother   . Diabetes Maternal Grandfather   . Heart disease Maternal Grandfather   . Colon cancer Maternal Uncle        x 2  . Colon polyps Unknown        x 9 maternal uncles/aunts  . Breast cancer Neg Hx     Social History Social History   Tobacco Use  . Smoking status: Current Every Day Smoker    Packs/day: 0.50    Years: 10.00    Pack years: 5.00    Types: Cigarettes    Start date: 02/23/1998  . Smokeless tobacco: Never Used  Substance Use Topics  . Alcohol use: Yes    Alcohol/week: 5.0 standard drinks    Types: 5 Glasses of wine per week    Comment: socially  . Drug use: No    Comment: quit for 15 yrs smoked for 15 started again 1 yr     Allergies   Imitrex [sumatriptan]; Sulfonamide derivatives; Reglan [metoclopramide]; Zofran Alvis Lemmings  hcl]; and Hibiclens [chlorhexidine]   Review of Systems Review of Systems  Constitutional: Negative for fever.  Musculoskeletal: Negative for arthralgias.  Skin: Positive for wound.    Allergic/Immunologic: Negative for immunocompromised state.  Neurological: Negative for weakness and numbness.     Physical Exam Updated Vital Signs BP 119/74 (BP Location: Left Arm)   Pulse (!) 104   Temp 98.1 F (36.7 C) (Oral)   Resp 17   Ht 5\' 4"  (1.626 m)   Wt 97.5 kg   LMP 04/11/2012   SpO2 100%   BMI 36.90 kg/m   Physical Exam  Constitutional: She appears well-developed and well-nourished. No distress.  HENT:  Head: Normocephalic and atraumatic.  Eyes: Conjunctivae are normal. No scleral icterus.  Neck: Normal range of motion.  Cardiovascular: Normal rate and intact distal pulses.  Capillary refill < 3 sec  Pulmonary/Chest: Effort normal.  Musculoskeletal: Normal range of motion. She exhibits no edema.  FROM of all fingers of the left hand  Neurological: She is alert.  Sensation: intact to normal touch throughout the left hand and left middle finger Strength: 5/5 strength with flexion and extension of the left middle finger  Skin: Skin is warm and dry. She is not diaphoretic.  3.2cm U shaped laceration over the PIP of the left middle finger.  Tendon is not visible at base of laceration.   Psychiatric: She has a normal mood and affect.  Nursing note and vitals reviewed.    ED Treatments / Results   Procedures .Marland KitchenLaceration Repair Date/Time: 08/31/2018 11:07 PM Performed by: Abigail Butts, PA-C Authorized by: Abigail Butts, PA-C   Consent:    Consent obtained:  Verbal   Consent given by:  Patient   Risks discussed:  Infection, need for additional repair, pain, poor cosmetic result and poor wound healing   Alternatives discussed:  No treatment and delayed treatment Universal protocol:    Procedure explained and questions answered to patient or proxy's satisfaction: yes     Relevant documents present and verified: yes     Test results available and properly labeled: yes     Imaging studies available: yes     Required blood products,  implants, devices, and special equipment available: yes     Site/side marked: yes     Immediately prior to procedure, a time out was called: yes     Patient identity confirmed:  Verbally with patient Anesthesia (see MAR for exact dosages):    Anesthesia method:  Nerve block   Block location:  Left middle finger   Block needle gauge:  27 G   Block anesthetic:  Lidocaine 1% w/o epi   Block technique:  Ring block   Block injection procedure:  Anatomic landmarks identified, anatomic landmarks palpated, introduced needle, negative aspiration for blood and incremental injection   Block outcome:  Anesthesia achieved Laceration details:    Location:  Finger   Finger location:  L long finger   Length (cm):  3.2 Repair type:    Repair type:  Intermediate Pre-procedure details:    Preparation:  Patient was prepped and draped in usual sterile fashion Exploration:    Hemostasis achieved with:  Direct pressure   Wound exploration: wound explored through full range of motion and entire depth of wound probed and visualized   Treatment:    Area cleansed with:  Betadine   Amount of cleaning:  Standard   Irrigation solution:  Sterile water   Irrigation volume:  522mL   Irrigation method:  Syringe  Subcutaneous repair:    Wound subcutaneous closure material used: prolene. Skin repair:    Repair method:  Sutures   Suture size:  4-0   Suture material:  Prolene   Suture technique:  Simple interrupted   Number of sutures:  4 Approximation:    Approximation:  Close Post-procedure details:    Dressing:  Open (no dressing)   Patient tolerance of procedure:  Tolerated well, no immediate complications .Splint Application Date/Time: 45/12/8880 11:09 PM Performed by: Abigail Butts, PA-C Authorized by: Abigail Butts, PA-C   Consent:    Consent obtained:  Verbal   Consent given by:  Patient   Risks discussed:  Discoloration, numbness, pain and swelling   Alternatives discussed:  No  treatment Pre-procedure details:    Sensation:  Normal   Skin color:  Flesh Procedure details:    Laterality:  Left   Location:  Finger   Finger:  L long finger   Splint type:  Finger   Supplies:  Aluminum splint Post-procedure details:    Pain:  Unchanged   Sensation:  Normal   Skin color:  Flesh   Patient tolerance of procedure:  Tolerated well, no immediate complications   (including critical care time)  Medications Ordered in ED Medications  Tdap (BOOSTRIX) injection 0.5 mL (has no administration in time range)  lidocaine (PF) (XYLOCAINE) 1 % injection 5 mL (5 mLs Infiltration Given 08/31/18 2217)     Initial Impression / Assessment and Plan / ED Course  I have reviewed the triage vital signs and the nursing notes.  Pertinent labs & imaging results that were available during my care of the patient were reviewed by me and considered in my medical decision making (see chart for details).     Pressure irrigation performed. Wound explored and base of wound visualized in a bloodless field without evidence of foreign body.  Laceration occurred >12 hours prior to repair.  Long discussion with patient about location of laceration and length of time that it had been opened.  I do recommend sutures as it continues to bleed however discussed with patient the risk of increased infection.  She states understanding and is in agreement with placing sutures, which was well tolerated.  Finger splint placed due to location of laceration.  Tdap updated.  Pt has no comorbidities to effect normal wound healing. Pt discharged without antibiotics, however discussed the possibility of infection and need for close follow-up if this occurs.  Discussed suture home care with patient and answered questions. Pt to follow-up for wound check and suture removal in 7-10 days; they are to return to the ED sooner for signs of infection. Pt is hemodynamically stable with no complaints prior to dc.    Final  Clinical Impressions(s) / ED Diagnoses   Final diagnoses:  Laceration of left middle finger without foreign body without damage to nail, initial encounter    ED Discharge Orders    None       Loni Muse Gwenlyn Perking 08/31/18 2314    Lajean Saver, MD 09/01/18 1819

## 2018-08-31 NOTE — Discharge Instructions (Addendum)
1. Medications: Tylenol or ibuprofen for pain, usual home medications °2. Treatment: ice for swelling, keep wound clean with warm soap and water and keep bandage dry, do not submerge in water for 24 hours °3. Follow Up: Please return in 7-10 days to have your stitches/staples removed or sooner if you have concerns. Return to the emergency department for increased redness, drainage of pus from the wound ° ° °WOUND CARE °• Keep area clean and dry for 24 hours. Do not remove bandage, if applied. °• After 24 hours, remove bandage and wash wound gently with mild soap and warm water. Reapply a new bandage after cleaning wound, if directed.  °• Continue daily cleansing with soap and water until stitches/staples are removed. °• Do not apply any ointments or creams to the wound while stitches/staples are in place, as this may cause delayed healing. °•Return if you experience any of the following signs of infection: Swelling, redness, pus drainage, streaking, fever >101.0 F °• Return if you experience excessive bleeding that does not stop after 15-20 minutes of constant, firm pressure. ° °

## 2018-08-31 NOTE — ED Notes (Signed)
Patient verbalizes understanding of discharge instructions. Opportunity for questioning and answers were provided. Armband removed by staff, pt discharged from ED ambulatory.   

## 2018-08-31 NOTE — ED Triage Notes (Signed)
Pt presents to the ED from home. Pt cut her left middle finger this morning with a pair of scissors. Pt states the cut will not stop bleeding and it "gushes".

## 2018-08-31 NOTE — ED Notes (Signed)
Suture cart at bedside, lidocaine at bedside

## 2018-10-28 DIAGNOSIS — M5134 Other intervertebral disc degeneration, thoracic region: Secondary | ICD-10-CM | POA: Diagnosis not present

## 2018-10-28 DIAGNOSIS — M5416 Radiculopathy, lumbar region: Secondary | ICD-10-CM | POA: Diagnosis not present

## 2018-10-28 DIAGNOSIS — M2578 Osteophyte, vertebrae: Secondary | ICD-10-CM | POA: Diagnosis not present

## 2018-10-28 DIAGNOSIS — Z9889 Other specified postprocedural states: Secondary | ICD-10-CM | POA: Diagnosis not present

## 2018-10-28 DIAGNOSIS — M4724 Other spondylosis with radiculopathy, thoracic region: Secondary | ICD-10-CM | POA: Diagnosis not present

## 2018-10-28 DIAGNOSIS — M5030 Other cervical disc degeneration, unspecified cervical region: Secondary | ICD-10-CM | POA: Diagnosis not present

## 2018-10-28 DIAGNOSIS — M9972 Connective tissue and disc stenosis of intervertebral foramina of thoracic region: Secondary | ICD-10-CM | POA: Diagnosis not present

## 2018-10-28 DIAGNOSIS — M48061 Spinal stenosis, lumbar region without neurogenic claudication: Secondary | ICD-10-CM | POA: Diagnosis not present

## 2018-10-28 DIAGNOSIS — Z4789 Encounter for other orthopedic aftercare: Secondary | ICD-10-CM | POA: Diagnosis not present

## 2018-10-28 DIAGNOSIS — M5136 Other intervertebral disc degeneration, lumbar region: Secondary | ICD-10-CM | POA: Diagnosis not present

## 2018-11-03 DIAGNOSIS — M48061 Spinal stenosis, lumbar region without neurogenic claudication: Secondary | ICD-10-CM | POA: Diagnosis not present

## 2018-11-19 DIAGNOSIS — M5416 Radiculopathy, lumbar region: Secondary | ICD-10-CM | POA: Diagnosis not present

## 2018-11-19 DIAGNOSIS — Z9889 Other specified postprocedural states: Secondary | ICD-10-CM | POA: Diagnosis not present

## 2018-11-24 DIAGNOSIS — M5416 Radiculopathy, lumbar region: Secondary | ICD-10-CM | POA: Diagnosis not present

## 2018-12-10 DIAGNOSIS — M5416 Radiculopathy, lumbar region: Secondary | ICD-10-CM | POA: Diagnosis not present

## 2018-12-11 ENCOUNTER — Ambulatory Visit: Payer: 59 | Admitting: Family

## 2018-12-11 ENCOUNTER — Encounter: Payer: Self-pay | Admitting: Family

## 2018-12-11 VITALS — BP 138/84 | HR 90 | Temp 98.0°F | Ht 64.0 in | Wt 211.0 lb

## 2018-12-11 DIAGNOSIS — M5440 Lumbago with sciatica, unspecified side: Secondary | ICD-10-CM

## 2018-12-11 DIAGNOSIS — G8929 Other chronic pain: Secondary | ICD-10-CM

## 2018-12-11 DIAGNOSIS — G629 Polyneuropathy, unspecified: Secondary | ICD-10-CM | POA: Diagnosis not present

## 2018-12-11 DIAGNOSIS — B009 Herpesviral infection, unspecified: Secondary | ICD-10-CM

## 2018-12-11 DIAGNOSIS — M79605 Pain in left leg: Secondary | ICD-10-CM

## 2018-12-11 MED ORDER — VALACYCLOVIR HCL 500 MG PO TABS: 500.0000 mg | ORAL_TABLET | Freq: Every day | ORAL | 11 refills | Status: DC

## 2018-12-11 MED ORDER — HYDROCODONE-ACETAMINOPHEN 5-325 MG PO TABS: 1.0000 | ORAL_TABLET | Freq: Four times a day (QID) | ORAL | 0 refills | Status: DC | PRN

## 2018-12-11 MED ORDER — ACYCLOVIR 5 % EX CREA: 1.0000 "application " | TOPICAL_CREAM | CUTANEOUS | 0 refills | Status: DC

## 2018-12-11 NOTE — Progress Notes (Signed)
Tamara Cross is a 53 y.o. female with the following history as recorded in EpicCare:  Patient Active Problem List   Diagnosis Date Noted  . Knee contusion 02/04/2018  . HNP (herniated nucleus pulposus), lumbar 11/14/2017  . Sciatic leg pain 10/21/2017  . Sinusitis, chronic 10/15/2016  . Asthmatic bronchitis with acute exacerbation 04/03/2016  . Acute URI 12/20/2015  . Ataxia 05/30/2015  . Liver lesion, left lobe 01/03/2015  . Gallstone pancreatitis 11/28/2014  . Dysphagia, pharyngoesophageal phase 10/06/2014  . Special screening for malignant neoplasm of colon 10/06/2014  . Dysphagia   . Special screening for malignant neoplasms, colon   . Arthralgia 09/12/2014  . Submuscular lipoma of chest 09/12/2014  . Leg pain 08/29/2014  . Hot flashes due to tamoxifen 08/29/2014  . Low back pain 04/26/2014  . Rash and nonspecific skin eruption 04/26/2014  . Malignant neoplasm of upper-outer quadrant of right female breast ([place]) 04/13/2014  . Palpitations 11/18/2013  . Allergy   . Heart murmur   . HIP PAIN 09/07/2010  . PEDAL EDEMA 04/26/2010  . EXTERNAL HEMORRHOIDS 01/24/2010  . HIATAL HERNIA 01/24/2010  . Reflux esophagitis 12/19/2009  . GERD 12/19/2009  . Irritable bowel syndrome 12/19/2009  . 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  . Migraine 08/23/2008  . ESOPHAGEAL STRICTURE 03/11/2008  . POLYARTHRALGIA 03/11/2008  . Obesity 05/01/2007  . ANXIETY 05/01/2007  . Chronic depressive disorder 05/01/2007  . PREMATURE VENTRICULAR CONTRACTIONS 05/01/2007  . Allergic rhinitis 05/01/2007  . MITRAL VALVE PROLAPSE, HX OF 05/01/2007  . PUD, HX OF 05/01/2007    Current Outpatient Medications  Medication Sig Dispense Refill  . albuterol (PROVENTIL HFA;VENTOLIN HFA) 108 (90 Base) MCG/ACT inhaler Inhale 1-2 puffs into the lungs every 6 (six) hours as needed for wheezing or shortness of breath. (Patient taking differently: Inhale 2 puffs into the lungs every 6 (six) hours as needed for wheezing or  shortness of breath. ) 1 Inhaler 0  . budesonide-formoterol (SYMBICORT) 160-4.5 MCG/ACT inhaler Inhale 2 puffs into the lungs 2 (two) times daily. 1 Inhaler 6  . cetirizine (ZYRTEC) 10 MG tablet Take 10 mg by mouth daily.    [name] desvenlafaxine (PRISTIQ) 100 MG 24 hr tablet Take 100 mg by mouth daily.      [name] gabapentin (NEURONTIN) 100 MG capsule Take 1-2 capsules (100-200 mg total) by mouth 3 (three) times daily. (Patient taking differently: Take 900 mg by mouth 3 (three) times daily. ) 180 capsule 3  . meloxicam (MOBIC) 7.5 MG tablet Take 7.5 mg by mouth daily.    . metoprolol succinate (TOPROL-XL) 50 MG 24 hr tablet Take 1 tablet (50 mg total) by mouth daily. Take with or immediately following a meal. 30 tablet 11  . predniSONE (DELTASONE) 10 MG tablet   1  . predniSONE (STERAPRED UNI-PAK 48 TAB) 10 MG (48) TBPK tablet TK UTD  0  . valACYclovir (VALTREX) 500 MG tablet Take 1 tablet (500 mg total) by mouth daily. Increase to bid prn with outbreak 60 tablet 11  . VYVANSE 50 MG capsule Take 50 mg by mouth every morning.   0  . acyclovir cream (ZOVIRAX) 5 % Apply 1 application topically every 4 (four) hours. 15 g 0  . HYDROcodone-acetaminophen (NORCO/VICODIN) 5-325 MG tablet Take 1 tablet by mouth every 6 (six) hours as needed for moderate pain. 30 tablet 0   No current facility-administered medications for this visit.    Facility-Administered Medications Ordered in Other Visits  Medication Dose Route Frequency Provider Last Rate Last Dose  .  gadopentetate dimeglumine (MAGNEVIST) injection 18 mL  18 mL Intravenous Once PRN Penumalli, Earlean Polka, MD        Allergies: Imitrex [sumatriptan]; Sulfonamide derivatives; Reglan [metoclopramide]; Zofran [ondansetron hcl]; and Hibiclens [chlorhexidine]  Past Medical History:  Diagnosis Date  . Allergy    chroni rhinitis  . Anemia    low iron  . Anxiety   . Arthritis   . 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 Complication of anesthesia    last 2 surgeries had some tachycardia  . Depression   . Dysrhythmia    tachy occ  . Esophageal stricture   . GERD (gastroesophageal reflux disease)   . H/O hiatal hernia   . Heart murmur    MVP  . IBS (irritable bowel syndrome)   . Migraine   . MVP (mitral valve prolapse)   . Osteoarthritis   . Peptic ulcer disease   . Pneumonia    hx  . PONV (postoperative nausea and vomiting)   . S/P radiation therapy  06/28/2014-08/08/2014    1) Right Breast / 50 Gy in 25 fractions/ 2) Right Breast Boost / 10 Gy in 5 fractions  . Skin cancer    basal cell     Past Surgical History:  Procedure Laterality Date  . APPENDECTOMY    . BREAST LUMPECTOMY Bilateral   . BREAST LUMPECTOMY WITH NEEDLE LOCALIZATION Bilateral 05/02/2014   Procedure: BILATERIAL BREAST LUMPECTOMY WITH NEEDLE LOCALIZATION;  Surgeon: Shann Medal, MD;  Location: [facility];  Service: General;  Laterality: Bilateral;  . CERVIX LESION DESTRUCTION    . CESAREAN SECTION  2002  . CHOLECYSTECTOMY  11/29/2014   lap chole   . CHOLECYSTECTOMY N/A 11/29/2014   Procedure: LAPAROSCOPIC CHOLECYSTECTOMY WITH INTRAOPERATIVE CHOLANGIOGRAM;  Surgeon: Doreen Salvage, MD;  Location: [facility];  Service: General;  Laterality: N/A;  . COLONOSCOPY N/A 10/06/2014   Procedure: COLONOSCOPY;  Surgeon: Lafayette Dragon, MD;  Location: [facility];  Service: Endoscopy;  Laterality: N/A;  . COLPOSCOPY    . CYSTECTOMY     left breast,in 20's  . DILATION AND CURETTAGE OF UTERUS     x2  . ESOPHAGOGASTRODUODENOSCOPY N/A 10/06/2014   Procedure: ESOPHAGOGASTRODUODENOSCOPY (EGD);  Surgeon: Lafayette Dragon, MD;  Location: [facility];  Service: Endoscopy;  Laterality: N/A;  . HAND SURGERY     cancer removed basal cell  . LUMBAR LAMINECTOMY/DECOMPRESSION MICRODISCECTOMY Left 11/14/2017   Procedure: DISCECTOMY LUMBAR Five - SACRAL - one, LEFT;  Surgeon: Ashok Pall, MD;  Location: [facility];  Service: Neurosurgery;  Laterality: Left;   MICRODISCECTOMY LUMBAR 5- SACRAL 1, LEFT  . LUMBAR LAMINECTOMY/DECOMPRESSION MICRODISCECTOMY Left 01/30/2018   Procedure: MICRODISCECTOMY LUMBAR 4- LUMBAR 5 LEFT;  Surgeon: Ashok Pall, MD;  Location: [facility];  Service: Neurosurgery;  Laterality: Left;  MICRODISCECTOMY LUMBAR 4- LUMBAR 5 LEFT  . NOVASURE ABLATION    . SHOULDER SURGERY Left    debridement ,bone spurs  . TONSILLECTOMY      Family History  Problem Relation Age of Onset  . Heart disease Father   . Hypertension Father   . Colon cancer Maternal Grandmother   . Depression Brother   . Diabetes Maternal Grandfather   . Heart disease Maternal Grandfather   . Colon cancer Maternal Uncle        x 2  . Colon polyps Unknown        x 9 maternal uncles/aunts  . Breast cancer Neg Hx  Social History   Tobacco Use  . Smoking status: Current Every Day Smoker    Packs/day: 0.50    Years: 10.00    Pack years: 5.00    Types: Cigarettes    Start date: 02/23/1998  . Smokeless tobacco: Never Used  Substance Use Topics  . Alcohol use: Yes    Alcohol/week: 5.0 standard drinks    Types: 5 Glasses of wine per week    Comment: socially    Subjective:  Patient is in the process of being referred to pain management for discussion/ evaluation of spinal cord stimulator; saw her orthopedist about continued pain yesterday- he refuses to give any narcotics per their office protocol/ until she can be seen by specialist- was told that she should try Mobic; today she is requesting short-term prescription for Norco until she is able to be seen by pain management on March 18. In reviewing chart, patient has had at least 3 surgeries on her back in the past year/ acute injury happened in December after fall.   Also requesting updated prescriptions for Valtrex; normally takes once per day- with outbreak, may need to take bid; also needs updated Rx for Acyclovir cream;    Objective:  Vitals:   12/11/18 0841  BP: 138/84  Pulse: 90  Temp: 98 F  (36.7 C)  TempSrc: Oral  SpO2: 98%  Weight: 211 lb (95.7 kg)  Height: 5\' 4"  (1.626 m)    General: Well developed, well nourished, in no acute distress- in obvious pain  Skin : Warm and dry.  Head: Normocephalic and atraumatic  Lungs: Respirations unlabored;  Musculoskeletal: No deformities; no active joint inflammation  Extremities: No edema, cyanosis, clubbing  Vessels: Symmetric bilaterally  Neurologic: Alert and oriented; speech intact; face symmetrical; using cane;  CNII-XII intact without focal deficit   Assessment:  1. Pain of left lower extremity   2. Chronic low back pain with sciatica, sciatica laterality unspecified, unspecified back pain laterality   3. Neuropathy   4. HSV (herpes simplex virus) infection     Plan:  In reviewing notes, patient's history is c/w what has been documented by her orthopedist; keep planned follow-up with pain management/ discussion of spinal cord stimulator; in the interim, will give short-term refill on Norco; NCCSRS has been reviewed; she will scheduled with her PCP to discuss long-term options for pain medication- she may choose to cancel this if not needed; 4. Refills updated.   Return in about 3 weeks (around 01/01/2019) for with Dr. Alain Marion.  No orders of the defined types were placed in this encounter.   Requested Prescriptions   Signed Prescriptions Disp Refills  . acyclovir cream (ZOVIRAX) 5 % 15 g 0    Sig: Apply 1 application topically every 4 (four) hours.  . valACYclovir (VALTREX) 500 MG tablet 60 tablet 11    Sig: Take 1 tablet (500 mg total) by mouth daily. Increase to bid prn with outbreak  . HYDROcodone-acetaminophen (NORCO/VICODIN) 5-325 MG tablet 30 tablet 0    Sig: Take 1 tablet by mouth every 6 (six) hours as needed for moderate pain.

## 2018-12-16 ENCOUNTER — Other Ambulatory Visit: Payer: Self-pay

## 2018-12-16 NOTE — Telephone Encounter (Signed)
Key: [id number]  Started today via cover my meds.

## 2018-12-30 DIAGNOSIS — M961 Postlaminectomy syndrome, not elsewhere classified: Secondary | ICD-10-CM | POA: Diagnosis not present

## 2018-12-30 DIAGNOSIS — M5416 Radiculopathy, lumbar region: Secondary | ICD-10-CM | POA: Diagnosis not present

## 2019-01-13 ENCOUNTER — Ambulatory Visit (INDEPENDENT_AMBULATORY_CARE_PROVIDER_SITE_OTHER): Payer: 59 | Admitting: Internal Medicine

## 2019-01-13 ENCOUNTER — Encounter: Payer: Self-pay | Admitting: Internal Medicine

## 2019-01-13 DIAGNOSIS — M79605 Pain in left leg: Secondary | ICD-10-CM | POA: Diagnosis not present

## 2019-01-13 DIAGNOSIS — K219 Gastro-esophageal reflux disease without esophagitis: Secondary | ICD-10-CM | POA: Diagnosis not present

## 2019-01-13 DIAGNOSIS — M5440 Lumbago with sciatica, unspecified side: Secondary | ICD-10-CM

## 2019-01-13 DIAGNOSIS — G8929 Other chronic pain: Secondary | ICD-10-CM | POA: Diagnosis not present

## 2019-01-13 MED ORDER — GABAPENTIN 300 MG PO CAPS: 900.0000 mg | ORAL_CAPSULE | Freq: Three times a day (TID) | ORAL | 0 refills | Status: DC

## 2019-01-13 MED ORDER — PANTOPRAZOLE SODIUM 40 MG PO TBEC: 40.0000 mg | DELAYED_RELEASE_TABLET | Freq: Every day | ORAL | 3 refills | Status: DC

## 2019-01-13 MED ORDER — VITAMIN D3 50 MCG (2000 UT) PO CAPS: 2000.0000 [IU] | ORAL_CAPSULE | Freq: Every day | ORAL | 3 refills | Status: DC

## 2019-01-13 MED ORDER — HYDROCODONE-ACETAMINOPHEN 5-325 MG PO TABS: ORAL_TABLET | ORAL | 0 refills | Status: DC

## 2019-01-13 NOTE — Assessment & Plan Note (Signed)
Re-start Protonix 

## 2019-01-13 NOTE — Progress Notes (Signed)
Virtual Visit via Telephone Note  I connected with Tamara Cross on 01/13/19 at  2:20 PM EDT by telephone and verified that I am speaking with the correct person using two identifiers.   I discussed the limitations, risks, security and privacy concerns of performing an evaluation and management service by telephone and the availability of in person appointments. I also discussed with the patient that there may be a patient responsible charge related to this service. The patient expressed understanding and agreed to proceed.   History of Present Illness: Tamara Cross is status post 3 back surgeries.  Her last surgery was in October 2019.  Since then she fell from the attic in December and hit her butt and her back on the left side.  The pain is very severe in the mornings with a lot of stiffness: she was taking 1 hydrocodone in the morning that helps her to get through the day.  She has been on gabapentin 900 mg 3 times a day.  She was put on Celebrex and stopped it due to lack of improvement.  She is currently taking ibuprofen 800 mg 3 times a day with food.  She used to be on Protonix a long time ago.  Dr. Redmond Pulling, her neurosurgeon, has been discussing the possibility of a nerve stimulator placement for pain control.   Observations/Objective:  NAD Assessment and Plan:  See plan Follow Up Instructions:    I discussed the assessment and treatment plan with the patient. The patient was provided an opportunity to ask questions and all were answered. The patient agreed with the plan and demonstrated an understanding of the instructions.   The patient was advised to call back or seek an in-person evaluation if the symptoms worsen or if the condition fails to improve as anticipated.  I provided 20 minutes of non-face-to-face time during this encounter.   Walker Kehr, MD

## 2019-01-13 NOTE — Assessment & Plan Note (Addendum)
Tamara Cross is status post 3 back surgeries.  Her last surgery was in October 2019.  Since then she fell from the attic in December and hit her butt and her back on the left side.  The pain is very severe in the mornings with a lot of stiffness: she was taking 1 hydrocodone in the morning that helps her to get through the day.  She has been on gabapentin 900 mg 3 times a day.  She was put on Celebrex and stopped it due to lack of improvement.  She is currently taking ibuprofen 800 mg 3 times a day with food.  She used to be on Protonix a long time ago.  Dr. Redmond Pulling, her neurosurgeon, has been discussing the possibility of a nerve stimulator placement for pain control. Norco prn Gabapentin  Potential benefits of a long term opioids use as well as potential risks (i.e. addiction risk, apnea etc) and complications (i.e. Somnolence, constipation and others) were explained to the patient and were aknowledged.

## 2019-01-13 NOTE — Assessment & Plan Note (Signed)
Tamara Cross is status post 3 back surgeries.  Her last surgery was in October 2019.  Since then she fell from the attic in December and hit her butt and her back on the left side.  The pain is very severe in the mornings with a lot of stiffness: she was taking 1 hydrocodone in the morning that helps her to get through the day.  She has been on gabapentin 900 mg 3 times a day.  She was put on Celebrex and stopped it due to lack of improvement.  She is currently taking ibuprofen 800 mg 3 times a day with food.  She used to be on Protonix a long time ago.  Dr. Redmond Pulling, her neurosurgeon, has been discussing the possibility of a nerve stimulator placement for pain control. Norco prn Gabapentin  Potential benefits of a long term opioids use as well as potential risks (i.e. addiction risk, apnea etc) and complications (i.e. Somnolence, constipation and others) were explained to the patient and were aknowledged.

## 2019-02-04 ENCOUNTER — Telehealth: Payer: Self-pay

## 2019-02-04 MED ORDER — HYDROCODONE-ACETAMINOPHEN 10-325 MG PO TABS: 1.0000 | ORAL_TABLET | Freq: Three times a day (TID) | ORAL | 0 refills | Status: DC | PRN

## 2019-02-04 NOTE — Telephone Encounter (Signed)
I emailed a prescription for higher dose hydrocodone.  Please follow-up up with me in the office in 1 month. Thanks

## 2019-02-04 NOTE — Telephone Encounter (Signed)
Copied from [place] [id number]. Topic: Quick Communication - Rx Refill/Question >> Feb 04, 2019  1:28 PM Alanda Slim E wrote: Medication: Pt wants to talk with Dr. Alain Marion about increasing her HYDROcodone-acetaminophen (NORCO/VICODIN) 5-325MG  tablet and a [facility] in [place] and get his opinion about something

## 2019-02-04 NOTE — Telephone Encounter (Signed)
Do we need to do a VOV? Thx

## 2019-02-04 NOTE — Telephone Encounter (Signed)
Pt has been doubling her current dosage of hydrocodone. She has tried the 1 tablet TID and she was still in severe pain with numbness and pain in her left leg. She would like to know if she can stay on the same dose or a stronger strength? She would also like to know if you can send in a prescription for Robaxin? She would also like to know if the [facility] in [place] is something you would recommend since she has already seen a neurosurgeon.

## 2019-02-09 NOTE — Telephone Encounter (Signed)
LM notifying pt

## 2019-02-09 NOTE — Telephone Encounter (Signed)
Appointment has been made in June.   Patient is also requesting a refill on the following medication.   metoprolol succinate (TOPROL-XL) 50 MG 24 hr tablet

## 2019-02-10 MED ORDER — METOPROLOL SUCCINATE ER 50 MG PO TB24: 50.0000 mg | ORAL_TABLET | Freq: Every day | ORAL | 11 refills | Status: DC

## 2019-02-10 NOTE — Telephone Encounter (Signed)
RX sent

## 2019-02-12 ENCOUNTER — Telehealth: Payer: 59 | Admitting: Family

## 2019-02-12 DIAGNOSIS — Z719 Counseling, unspecified: Secondary | ICD-10-CM

## 2019-02-12 NOTE — Progress Notes (Signed)
Thank you for the details you included in the comment boxes. Those details are very helpful in determining the best course of treatment for you and help [place] to provide the best care.  Given the complexity of your situation, please reach out to Dr. Judeen Hammans clinic so as to avoid medications which would interact with your current regimen.  Based on what you shared with me, I feel your condition warrants further evaluation and I recommend that you be seen for a face to face office visit. You will see offices below, but calling Dr. Alain Marion is ideal.    NOTE: If you entered your credit card information for this eVisit, you will not be charged. You may see a "hold" on your card for the $35 but that hold will drop off and you will not have a charge processed.  If you are having a true medical emergency please call 911.  If you need an urgent face to face visit, [facility] has four urgent care centers for your convenience.    PLEASE NOTE: THE [facility] LOCATIONS AND URGENT CARE CLINICS DO NOT HAVE THE TESTING FOR CORONAVIRUS COVID19 AVAILABLE.  IF YOU FEEL YOU NEED THIS TEST YOU MUST GO TO A TRIAGE LOCATION AT [facility]   [email or web address] to reserve your spot online an avoid wait times  [facility] [address] [place], [place] [address] Modified hours of operation: Monday-Friday, 12 PM to 6 PM  Saturday & Sunday 10 AM to 4 PM *Across the street from [place] (New Address!) [address], [place], [place] [address] *Just off [address], across the road from [place] hours of operation: Monday-Friday, 12 PM to 6 PM  Closed Saturday & Sunday  [facility]'s modified hours of operation will be in effect from May 1 until May 31   The following sites will take your insurance:  . [facility] [place] a Provider at  this Location  [address] [place], [place] [address] . 10 am to 8 pm Monday-Friday . 12 pm to 8 pm Saturday-Sunday   . [facility] [place] a Provider at this Location  [address], [place], [place] [address] . 8 am to 8 pm Monday-Friday . 9 am to 6 pm Saturday . 11 am to 6 pm Sunday   . [facility] at [place] Get Driving Directions  [address] [place], [place] [address] . 8 am to 8 pm Monday-Friday . 8 am to 4 pm Saturday-Sunday   Your e-visit answers were reviewed by a board certified advanced clinical practitioner to complete your personal care plan.  Thank you for using e-Visits.

## 2019-02-22 ENCOUNTER — Encounter: Payer: Self-pay | Admitting: Internal Medicine

## 2019-02-22 ENCOUNTER — Ambulatory Visit (INDEPENDENT_AMBULATORY_CARE_PROVIDER_SITE_OTHER): Payer: 59 | Admitting: Internal Medicine

## 2019-02-22 DIAGNOSIS — G8929 Other chronic pain: Secondary | ICD-10-CM

## 2019-02-22 DIAGNOSIS — M62830 Muscle spasm of back: Secondary | ICD-10-CM | POA: Diagnosis not present

## 2019-02-22 DIAGNOSIS — M5440 Lumbago with sciatica, unspecified side: Secondary | ICD-10-CM

## 2019-02-22 MED ORDER — BUDESONIDE-FORMOTEROL FUMARATE 160-4.5 MCG/ACT IN AERO: 2.0000 | INHALATION_SPRAY | Freq: Two times a day (BID) | RESPIRATORY_TRACT | 3 refills | Status: DC

## 2019-02-22 MED ORDER — ALBUTEROL SULFATE HFA 108 (90 BASE) MCG/ACT IN AERS: 1.0000 | INHALATION_SPRAY | Freq: Four times a day (QID) | RESPIRATORY_TRACT | 3 refills | Status: DC | PRN

## 2019-02-22 MED ORDER — METHOCARBAMOL 500 MG PO TABS: 500.0000 mg | ORAL_TABLET | Freq: Every evening | ORAL | 0 refills | Status: DC | PRN

## 2019-02-22 MED ORDER — HYDROCODONE-ACETAMINOPHEN 10-325 MG PO TABS: 1.0000 | ORAL_TABLET | Freq: Four times a day (QID) | ORAL | 0 refills | Status: AC | PRN

## 2019-02-22 NOTE — Assessment & Plan Note (Signed)
Methocarbamol added at HS

## 2019-02-22 NOTE — Progress Notes (Signed)
Virtual Visit via Video Note  I connected with Tamara Cross on 02/22/19 at  8:50 AM EDT by a video enabled telemedicine application and verified that I am speaking with the correct person using two identifiers.   I discussed the limitations of evaluation and management by telemedicine and the availability of in person appointments. The patient expressed understanding and agreed to proceed.  History of Present Illness:   We need to follow-up on a chronic low back pain.  Tamara Cross is status post 3 spinal surgeries.  She is planning to go to [facility] and [facility] for consultation and possible surgical intervention.  They are currently reviewing her MRIs.  They think they can help her.  Tamara Cross is using hydrocodone with some relief.  She used to get relief from methocarbamol at night in the past.  Observations/Objective:  Tamara Cross is in no acute distress Assessment and Plan: See plan  Follow Up Instructions:    I discussed the assessment and treatment plan with the patient. The patient was provided an opportunity to ask questions and all were answered. The patient agreed with the plan and demonstrated an understanding of the instructions.   The patient was advised to call back or seek an in-person evaluation if the symptoms worsen or if the condition fails to improve as anticipated.     Walker Kehr, MD

## 2019-02-22 NOTE — Assessment & Plan Note (Addendum)
  Norco 10/325 Methocarbamol at hs   Tamara Cross is status post 3 spinal surgeries.  She is planning to go to [facility] for consultation and possible surgical intervention.  They are currently reviewing her MRIs.  They think they can help her.  Tamara Cross is using hydrocodone with some relief.  She used to get relief from methocarbamol at night in the past.

## 2019-03-09 ENCOUNTER — Telehealth: Payer: Self-pay | Admitting: Internal Medicine

## 2019-03-09 MED ORDER — HYDROCODONE-ACETAMINOPHEN 10-325 MG PO TABS
1.0000 | ORAL_TABLET | Freq: Four times a day (QID) | ORAL | 0 refills | Status: AC | PRN
Start: 2019-03-09 — End: 2019-03-14

## 2019-03-09 MED ORDER — METHOCARBAMOL 500 MG PO TABS: 500.0000 mg | ORAL_TABLET | Freq: Every evening | ORAL | 0 refills | Status: DC | PRN

## 2019-03-09 NOTE — Telephone Encounter (Signed)
Copied from [place] [id number]. Topic: Quick Communication - Rx Refill/Question >> Mar 09, 2019 12:36 PM Gustavus Messing wrote: Medication: HYDROcodone-acetaminophen (NORCO) 10-325 MG tablet  methocarbamol (ROBAXIN) 500 MG tablet    Has the patient contacted their pharmacy? No. (Agent: If no, request that the patient contact the pharmacy for the refill.) Patient got approved for 2 spinal surgeries and will need these medications before she goes. Patient will need at least a 2 -3 week supply before flying out sunday  Preferred Pharmacy (with phone number or street name):   Agent: Please be advised that RX refills may take up to 3 business days. We ask that you follow-up with your pharmacy.

## 2019-03-09 NOTE — Telephone Encounter (Signed)
Ok Thx 

## 2019-03-09 NOTE — Telephone Encounter (Signed)
Please advise 

## 2019-03-25 ENCOUNTER — Ambulatory Visit: Payer: 59 | Admitting: Internal Medicine

## 2019-04-26 IMAGING — MR MR LUMBAR SPINE W/O CM
4 of 5 series · 18 of 48 positions shown · non-contrast
Comparison: Are MRI 09/27/2015

CLINICAL DATA: Sciatic leg pain. Back pain with right leg pain and
weakness

EXAM:
MRI LUMBAR SPINE WITHOUT CONTRAST
TECHNIQUE: Multiplanar, multisequence MR imaging of the lumbar spine was
performed. No intravenous contrast was administered.

[Series 6: T2 · sagittal · 4.0mm · 0.73mm/px · 6 of 15 slices shown (1 of 2)]
[im 1/15]
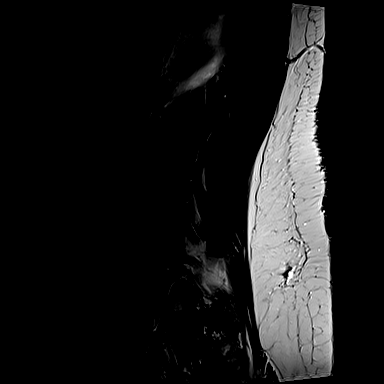
[im 3/15]
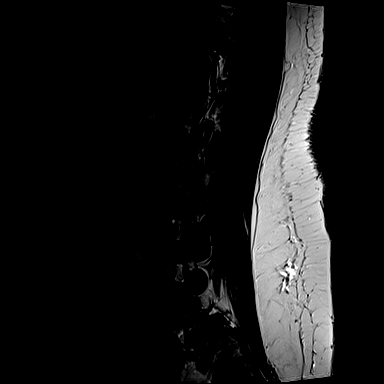
[im 6/15]
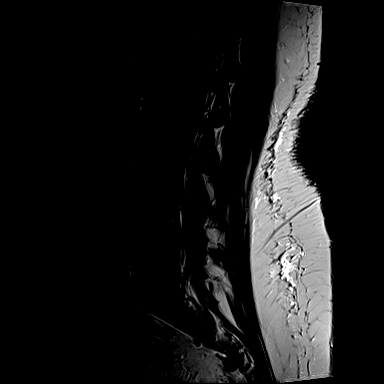
[im 9/15]
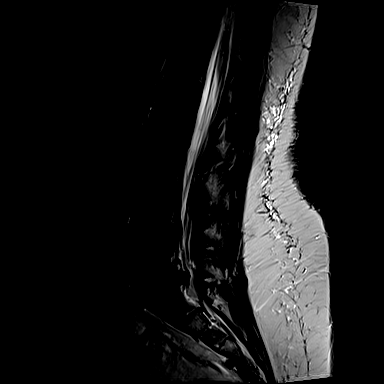
[im 12/15]
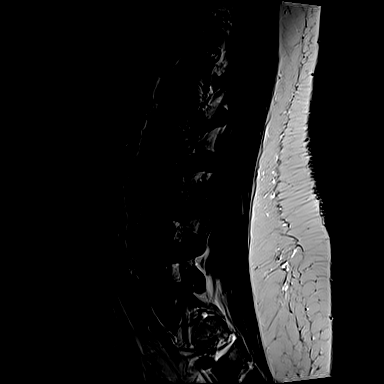
[im 15/15]
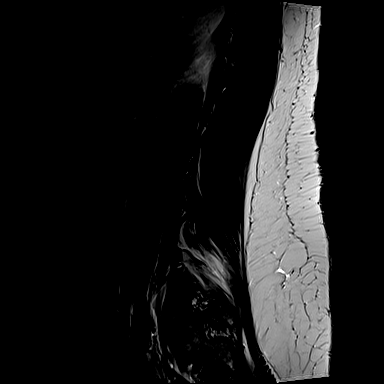

[Series 7: T1 · sagittal · 4.0mm · 0.73mm/px · 3 of 15 slices shown (1 of 2)]
[im 3/15]
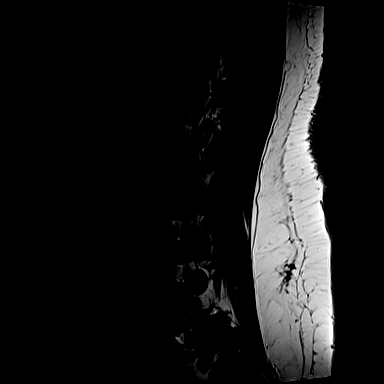
[im 9/15]
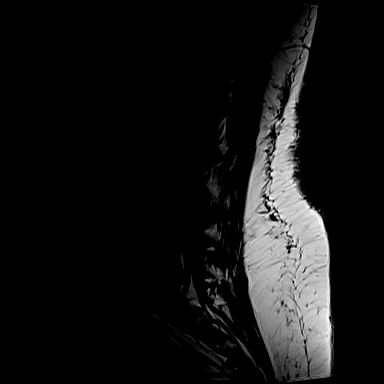
[im 15/15]
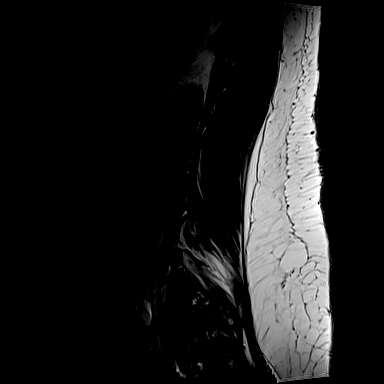

[Series 11: T1 · axial · 4.0mm · 0.28mm/px · z∈[+16,+172]mm · 3 of 40 slices shown (2 of 2)]
[im 6/40]
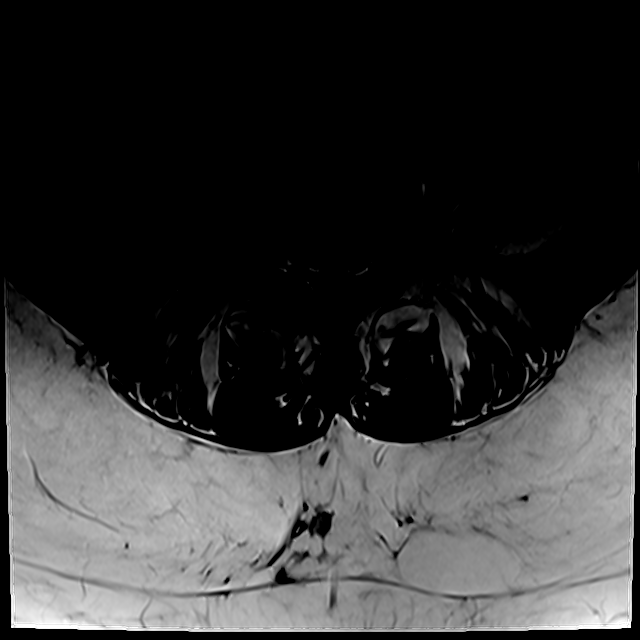
[im 20/40]
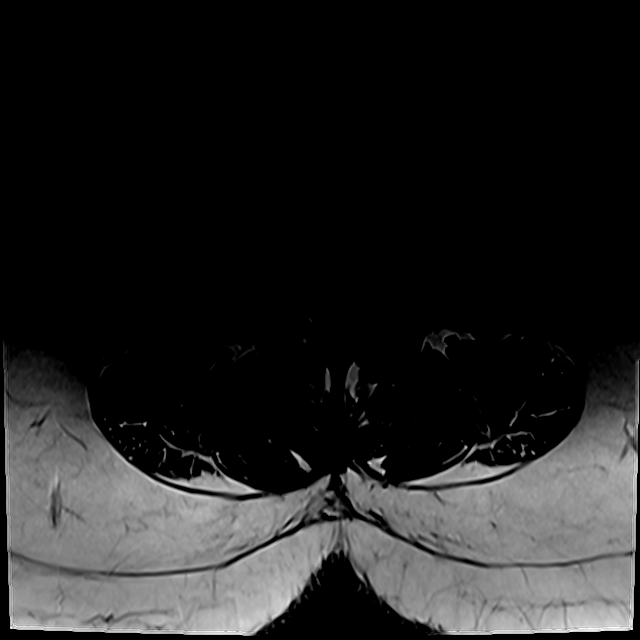
[im 34/40]
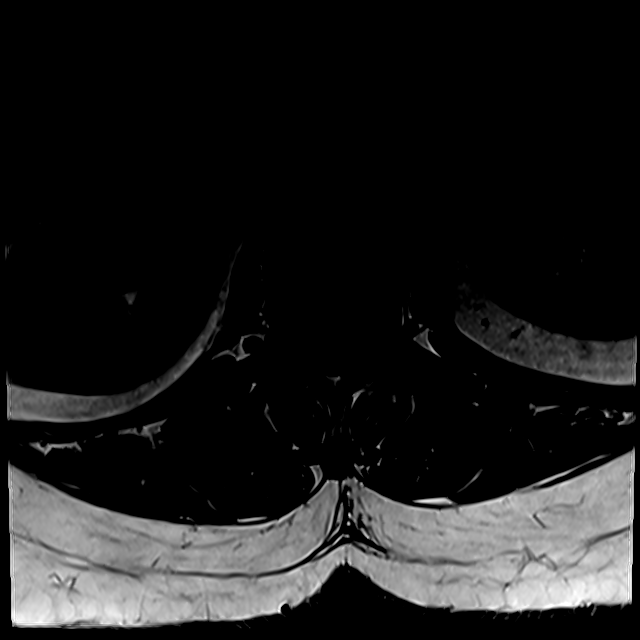

[Series 14: T2 · axial · 4.0mm · 0.28mm/px · z∈[-9,+172]mm · 6 of 40 slices shown (2 of 2)]
[im 1/40]
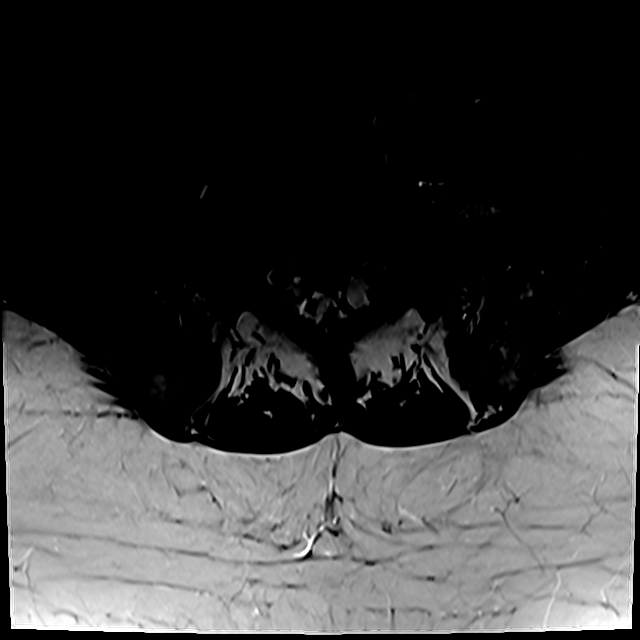
[im 6/40]
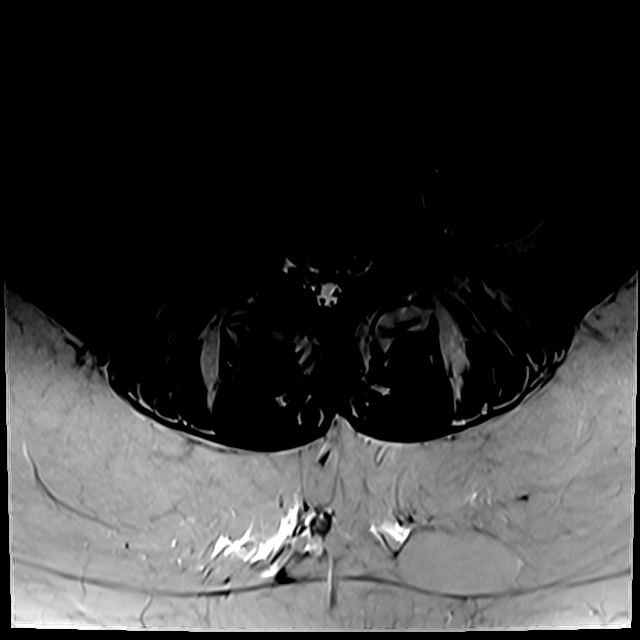
[im 12/40]
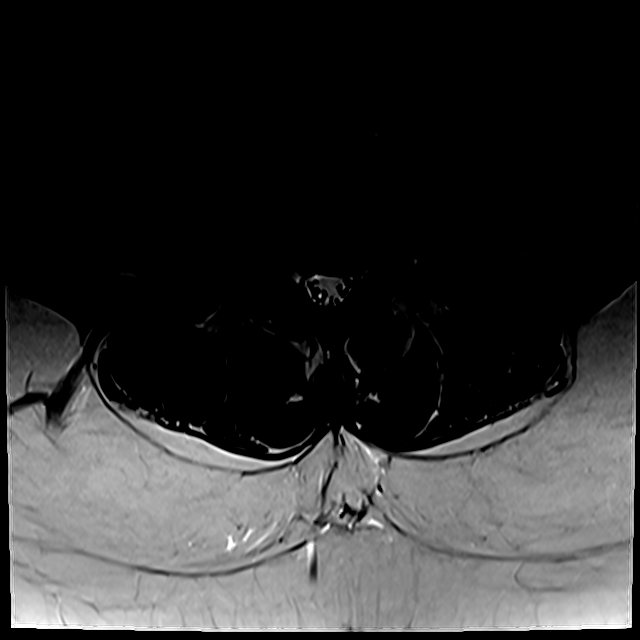
[im 17/40]
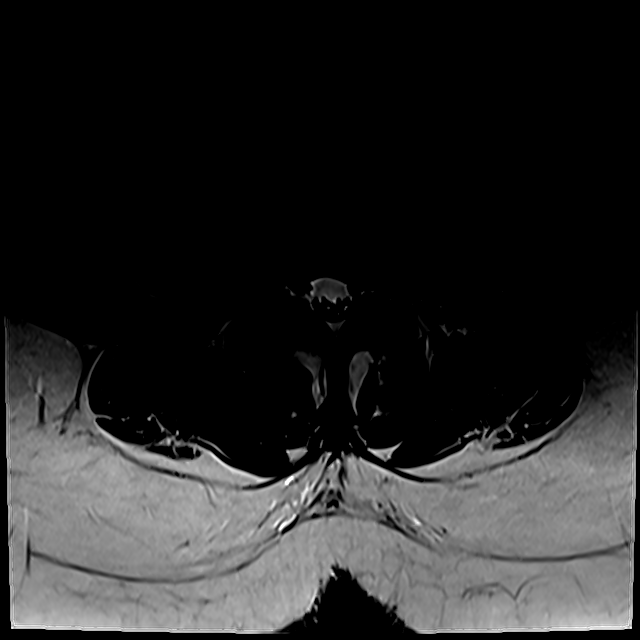
[im 20/40]
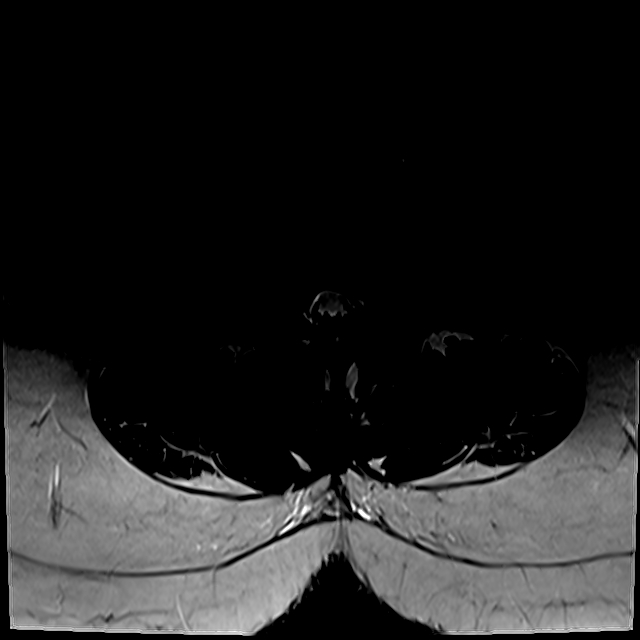
[im 34/40]
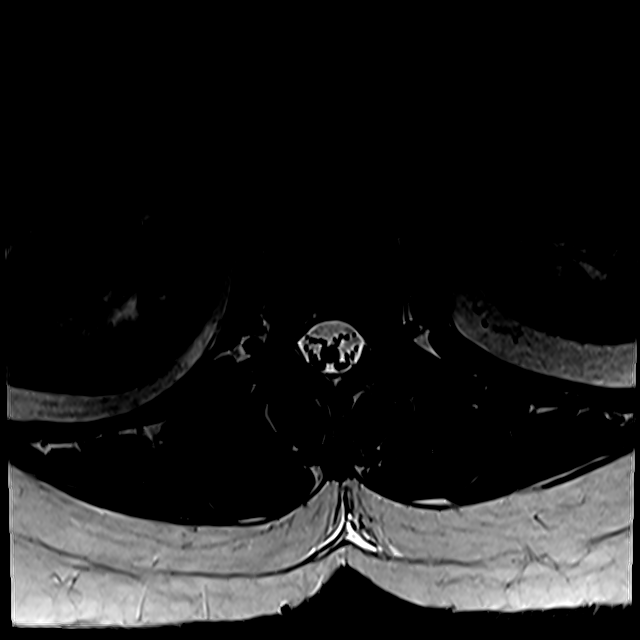

[18 of 48 positions shown; findings below may reference images not displayed]

FINDINGS: Segmentation:  Normal

Alignment: Mild retrolisthesis L2-3, unchanged. Remainder of the
alignment is normal.

Vertebrae:  Negative for fracture or mass

Conus medullaris and cauda equina: Conus extends to the L1-2 level.
Conus and cauda equina appear normal.

Paraspinal and other soft tissues: Negative for mass or adenopathy.

Disc levels:

L1-2: Negative

L2-3: Mild disc degeneration and mild retrolisthesis. Negative for
stenosis

L3-4: Mild disc bulging and mild facet degeneration. Negative for
stenosis

L4-5: Left paracentral shallow disc protrusion has progressed in the
interval. Mild subarticular stenosis on the left with possible mild
impingement of the left L5 nerve root. Mild spinal stenosis. Mild
facet degeneration.

L5-S1: Moderately large left paracentral disc extrusion with
compression of the left S1 nerve root. This is new since the prior
study.
IMPRESSION: Mild spinal stenosis L4-5. Shallow left paracentral disc protrusion
with possible mild impingement left L5 nerve root

Moderately large left paracentral disc protrusion L5-S1 with
extruded disc fragment compressing the left S1 nerve root.

## 2019-04-27 ENCOUNTER — Telehealth: Payer: Self-pay | Admitting: Internal Medicine

## 2019-04-27 NOTE — Telephone Encounter (Signed)
Copied from [place] [id number]. Topic: General - Other >> Apr 27, 2019  3:24 PM Keene Breath wrote: Reason for CRM: Patient called to ask the doctor for some pain medication before she has her surgery in [place] in a few weeks.  Please advise and call patient back at [phone]

## 2019-04-27 NOTE — Telephone Encounter (Signed)
Called patient and she has 4 pills of her Hydrocodone left And would like her Rx renewed for another 2-3 weeks. She gets her spinals surgery in about 2 weeks. Please advise

## 2019-04-28 MED ORDER — HYDROCODONE-ACETAMINOPHEN 10-325 MG PO TABS: 1.0000 | ORAL_TABLET | Freq: Three times a day (TID) | ORAL | 0 refills | Status: AC | PRN

## 2019-04-28 NOTE — Telephone Encounter (Signed)
Okay. Thank you.

## 2019-04-28 NOTE — Telephone Encounter (Signed)
[place] Controlled Database Checked Last filled: 03/09/19 # 120 LOV w/you: 02/22/19 Next appt w/you: None

## 2019-04-29 NOTE — Telephone Encounter (Signed)
Notified pt MD sent rx to [facility] 

## 2019-07-12 ENCOUNTER — Other Ambulatory Visit: Payer: Self-pay

## 2019-07-12 ENCOUNTER — Ambulatory Visit (INDEPENDENT_AMBULATORY_CARE_PROVIDER_SITE_OTHER): Payer: 59 | Admitting: Internal Medicine

## 2019-07-12 ENCOUNTER — Encounter: Payer: Self-pay | Admitting: Internal Medicine

## 2019-07-12 DIAGNOSIS — F329 Major depressive disorder, single episode, unspecified: Secondary | ICD-10-CM

## 2019-07-12 DIAGNOSIS — M5430 Sciatica, unspecified side: Secondary | ICD-10-CM

## 2019-07-12 DIAGNOSIS — M5126 Other intervertebral disc displacement, lumbar region: Secondary | ICD-10-CM

## 2019-07-12 DIAGNOSIS — M5440 Lumbago with sciatica, unspecified side: Secondary | ICD-10-CM

## 2019-07-12 DIAGNOSIS — F32A Depression, unspecified: Secondary | ICD-10-CM

## 2019-07-12 DIAGNOSIS — F909 Attention-deficit hyperactivity disorder, unspecified type: Secondary | ICD-10-CM

## 2019-07-12 DIAGNOSIS — G8929 Other chronic pain: Secondary | ICD-10-CM

## 2019-07-12 MED ORDER — VYVANSE 50 MG PO CAPS: 50.0000 mg | ORAL_CAPSULE | ORAL | 0 refills | Status: DC

## 2019-07-12 MED ORDER — HYDROCODONE-ACETAMINOPHEN 10-325 MG PO TABS: 1.0000 | ORAL_TABLET | Freq: Three times a day (TID) | ORAL | 0 refills | Status: AC | PRN

## 2019-07-12 MED ORDER — DESVENLAFAXINE SUCCINATE ER 100 MG PO TB24: 100.0000 mg | ORAL_TABLET | Freq: Every day | ORAL | 3 refills | Status: DC

## 2019-07-12 NOTE — Assessment & Plan Note (Signed)
Anxious depression Pristiq

## 2019-07-12 NOTE — Assessment & Plan Note (Signed)
[facility] I'll have to take over prescribing  Potential benefits of a long term stimulant use as well as potential risks  and complications were explained to the patient and were aknowledged.

## 2019-07-12 NOTE — Assessment & Plan Note (Signed)
Went to [facility] - in the process of approval Norco prn  Potential benefits of a long term opioids use as well as potential risks (i.e. addiction risk, apnea etc) and complications (i.e. Somnolence, constipation and others) were explained to the patient and were aknowledged.

## 2019-07-12 NOTE — Patient Instructions (Signed)

## 2019-07-12 NOTE — Assessment & Plan Note (Signed)
2020 Went to [facility] - in the process of approval Norco prn  Potential benefits of a long term opioids use as well as potential risks (i.e. addiction risk, apnea etc) and complications (i.e. Somnolence, constipation and others) were explained to the patient and were aknowledged.

## 2019-07-12 NOTE — Assessment & Plan Note (Signed)
2020 Went to [facility] - in the process of approval Norco prn  Potential benefits of a long term opioids use as well as potential risks (i.e. addiction risk, apnea etc) and complications (i.e. Somnolence, constipation and others) were explained to the patient and were aknowledged.

## 2019-07-12 NOTE — Progress Notes (Signed)
Subjective:  Patient ID: Tamara Cross, female    DOB: [birth year]  Age: 53 y.o. MRN: [id number]  CC: No chief complaint on file.   HPI Tamara Cross presents for LBP -- severe w/irrad to the LLE, numb Went to [facility] - in the process of approval F/uGERD C/o ADHD F/u depression  Outpatient Medications Prior to Visit  Medication Sig Dispense Refill  . acyclovir cream (ZOVIRAX) 5 % Apply 1 application topically every 4 (four) hours. 15 g 0  . albuterol (VENTOLIN HFA) 108 (90 Base) MCG/ACT inhaler Inhale 1-2 puffs into the lungs every 6 (six) hours as needed for wheezing or shortness of breath. 3 Inhaler 3  . budesonide-formoterol (SYMBICORT) 160-4.5 MCG/ACT inhaler Inhale 2 puffs into the lungs 2 (two) times daily. 3 Inhaler 3  . cetirizine (ZYRTEC) 10 MG tablet Take 10 mg by mouth daily.    . Cholecalciferol (VITAMIN D3) 50 MCG (2000 UT) capsule Take 1 capsule (2,000 Units total) by mouth daily. 100 capsule 3  . desvenlafaxine (PRISTIQ) 100 MG 24 hr tablet Take 100 mg by mouth daily.      [name] gabapentin (NEURONTIN) 300 MG capsule Take 3 capsules (900 mg total) by mouth 3 (three) times daily. 270 capsule 0  . meloxicam (MOBIC) 7.5 MG tablet Take 7.5 mg by mouth daily.    . methocarbamol (ROBAXIN) 500 MG tablet Take 1 tablet (500 mg total) by mouth at bedtime as needed for muscle spasms. 90 tablet 0  . metoprolol succinate (TOPROL-XL) 50 MG 24 hr tablet Take 1 tablet (50 mg total) by mouth daily. Take with or immediately following a meal. 30 tablet 11  . pantoprazole (PROTONIX) 40 MG tablet Take 1 tablet (40 mg total) by mouth daily. 90 tablet 3  . predniSONE (DELTASONE) 10 MG tablet   1  . valACYclovir (VALTREX) 500 MG tablet Take 1 tablet (500 mg total) by mouth daily. Increase to bid prn with outbreak 60 tablet 11  . VYVANSE 50 MG capsule Take 50 mg by mouth every morning.   0  . predniSONE (STERAPRED UNI-PAK 48 TAB) 10 MG (48) TBPK tablet TK UTD  0    Facility-Administered Medications Prior to Visit  Medication Dose Route Frequency Provider Last Rate Last Dose  . gadopentetate dimeglumine (MAGNEVIST) injection 18 mL  18 mL Intravenous Once PRN Penumalli, Vikram R, MD        ROS: Review of Systems  Constitutional: Negative for activity change, appetite change, chills, fatigue and unexpected weight change.  HENT: Negative for congestion, mouth sores and sinus pressure.   Eyes: Negative for visual disturbance.  Respiratory: Negative for cough and chest tightness.   Gastrointestinal: Negative for abdominal pain and nausea.  Genitourinary: Negative for difficulty urinating, frequency and vaginal pain.  Musculoskeletal: Positive for back pain. Negative for gait problem.  Skin: Negative for pallor and rash.  Neurological: Negative for dizziness, tremors, weakness, numbness and headaches.  Psychiatric/Behavioral: Negative for confusion, sleep disturbance and suicidal ideas. The patient is not nervous/anxious.     Objective:  BP 126/78 (BP Location: Left Arm, Patient Position: Sitting, Cuff Size: Large)   Pulse 82   Temp 98.2 F (36.8 C) (Oral)   Ht 5\' 4"  (1.626 m)   Wt 207 lb (93.9 kg)   LMP 04/11/2012   SpO2 96%   BMI 35.53 kg/m   BP Readings from Last 3 Encounters:  07/12/19 126/78  12/11/18 138/84  08/31/18 120/76    Wt Readings from Last  3 Encounters:  07/12/19 207 lb (93.9 kg)  12/11/18 211 lb (95.7 kg)  08/31/18 215 lb (97.5 kg)    Physical Exam Constitutional:      General: She is not in acute distress.    Appearance: She is well-developed.  HENT:     Head: Normocephalic.     Right Ear: External ear normal.     Left Ear: External ear normal.     Nose: Nose normal.  Eyes:     General:        Right eye: No discharge.        Left eye: No discharge.     Conjunctiva/sclera: Conjunctivae normal.     Pupils: Pupils are equal, round, and reactive to light.  Neck:     Musculoskeletal: Normal range of motion and  neck supple.     Thyroid: No thyromegaly.     Vascular: No JVD.     Trachea: No tracheal deviation.  Cardiovascular:     Rate and Rhythm: Normal rate and regular rhythm.     Heart sounds: Normal heart sounds.  Pulmonary:     Effort: No respiratory distress.     Breath sounds: No stridor. No wheezing.  Abdominal:     General: Bowel sounds are normal. There is no distension.     Palpations: Abdomen is soft. There is no mass.     Tenderness: There is no abdominal tenderness. There is no guarding or rebound.  Musculoskeletal:        General: No tenderness.  Lymphadenopathy:     Cervical: No cervical adenopathy.  Skin:    Findings: No erythema or rash.  Neurological:     Cranial Nerves: No cranial nerve deficit.     Motor: No abnormal muscle tone.     Coordination: Coordination normal.     Deep Tendon Reflexes: Reflexes normal.  Psychiatric:        Behavior: Behavior normal.        Thought Content: Thought content normal.        Judgment: Judgment normal.   Obese LS w/pain  Lab Results  Component Value Date   WBC 16.5 (H) 02/09/2018   HGB 14.1 02/09/2018   HCT 43.2 02/09/2018   PLT 330 02/09/2018   GLUCOSE 88 02/09/2018   CHOL 178 08/23/2008   TRIG 47 08/23/2008   HDL 52.7 08/23/2008   LDLCALC 116 (H) 08/23/2008   ALT 36 02/09/2018   AST 24 02/09/2018   NA 140 02/09/2018   K 4.3 02/09/2018   CL 101 02/09/2018   CREATININE 0.69 02/09/2018   BUN 14 02/09/2018   CO2 28 02/09/2018   TSH 5.12 (H) 10/21/2017    No results found.  Assessment & Plan:   There are no diagnoses linked to this encounter.   No orders of the defined types were placed in this encounter.    Follow-up: No follow-ups on file.  Walker Kehr, MD

## 2019-08-05 ENCOUNTER — Encounter: Payer: Self-pay | Admitting: Gastroenterology

## 2019-09-06 ENCOUNTER — Encounter: Payer: Self-pay | Admitting: Gastroenterology

## 2019-09-14 ENCOUNTER — Telehealth: Payer: Self-pay | Admitting: Internal Medicine

## 2019-09-14 NOTE — Telephone Encounter (Signed)
Pt states that her pharmacy would not fill a 90 day supply of VYVANSE 50 MG capsule and only gave her 31 pills.  Pt states that she did get that filled a month ago and thought it would be renewed because of the 90 day supply written.  However, the pharmacy will not fill the remaining of this and states that they need a new script.  Pt states that she has been off of the medication for a week. Pt needs a new script sent in to  [facility] [phone] (Phone) [phone] (Fax)

## 2019-09-15 MED ORDER — VYVANSE 50 MG PO CAPS: 50.0000 mg | ORAL_CAPSULE | ORAL | 0 refills | Status: DC

## 2019-09-15 NOTE — Telephone Encounter (Signed)
Ok Done Thx 

## 2019-10-12 ENCOUNTER — Ambulatory Visit: Payer: 59 | Admitting: Internal Medicine

## 2019-10-14 ENCOUNTER — Ambulatory Visit (INDEPENDENT_AMBULATORY_CARE_PROVIDER_SITE_OTHER): Payer: 59 | Admitting: Internal Medicine

## 2019-10-14 ENCOUNTER — Other Ambulatory Visit: Payer: Self-pay

## 2019-10-14 ENCOUNTER — Encounter: Payer: Self-pay | Admitting: Internal Medicine

## 2019-10-14 DIAGNOSIS — F909 Attention-deficit hyperactivity disorder, unspecified type: Secondary | ICD-10-CM

## 2019-10-14 DIAGNOSIS — M5440 Lumbago with sciatica, unspecified side: Secondary | ICD-10-CM

## 2019-10-14 DIAGNOSIS — G8929 Other chronic pain: Secondary | ICD-10-CM | POA: Diagnosis not present

## 2019-10-14 MED ORDER — VYVANSE 50 MG PO CAPS: 50.0000 mg | ORAL_CAPSULE | ORAL | 0 refills | Status: DC

## 2019-10-14 MED ORDER — HYDROCODONE-ACETAMINOPHEN 5-325 MG PO TABS: ORAL_TABLET | ORAL | 0 refills | Status: DC

## 2019-10-14 NOTE — Patient Instructions (Signed)

## 2019-10-14 NOTE — Assessment & Plan Note (Signed)
Wt Readings from Last 3 Encounters:  10/14/19 204 lb (92.5 kg)  07/12/19 207 lb (93.9 kg)  12/11/18 211 lb (95.7 kg)

## 2019-10-14 NOTE — Progress Notes (Signed)
Subjective:  Patient ID: Tamara Cross, female    DOB: 02/28/1966  Age: 54 y.o. MRN: [id number]  CC: No chief complaint on file.   HPI Tamara Cross presents for LBP, sciatica, ADD, asthma f/u  Outpatient Medications Prior to Visit  Medication Sig Dispense Refill  . acyclovir cream (ZOVIRAX) 5 % Apply 1 application topically every 4 (four) hours. 15 g 0  . albuterol (VENTOLIN HFA) 108 (90 Base) MCG/ACT inhaler Inhale 1-2 puffs into the lungs every 6 (six) hours as needed for wheezing or shortness of breath. 3 Inhaler 3  . budesonide-formoterol (SYMBICORT) 160-4.5 MCG/ACT inhaler Inhale 2 puffs into the lungs 2 (two) times daily. 3 Inhaler 3  . cetirizine (ZYRTEC) 10 MG tablet Take 10 mg by mouth daily.    . Cholecalciferol (VITAMIN D3) 50 MCG (2000 UT) capsule Take 1 capsule (2,000 Units total) by mouth daily. 100 capsule 3  . desvenlafaxine (PRISTIQ) 100 MG 24 hr tablet Take 1 tablet (100 mg total) by mouth daily. 90 tablet 3  . gabapentin (NEURONTIN) 300 MG capsule Take 3 capsules (900 mg total) by mouth 3 (three) times daily. 270 capsule 0  . meloxicam (MOBIC) 7.5 MG tablet Take 7.5 mg by mouth daily.    . methocarbamol (ROBAXIN) 500 MG tablet Take 1 tablet (500 mg total) by mouth at bedtime as needed for muscle spasms. 90 tablet 0  . metoprolol succinate (TOPROL-XL) 50 MG 24 hr tablet Take 1 tablet (50 mg total) by mouth daily. Take with or immediately following a meal. 30 tablet 11  . pantoprazole (PROTONIX) 40 MG tablet Take 1 tablet (40 mg total) by mouth daily. 90 tablet 3  . predniSONE (DELTASONE) 10 MG tablet   1  . valACYclovir (VALTREX) 500 MG tablet Take 1 tablet (500 mg total) by mouth daily. Increase to bid prn with outbreak 60 tablet 11  . VYVANSE 50 MG capsule Take 1 capsule (50 mg total) by mouth every morning. 30 capsule 0   Facility-Administered Medications Prior to Visit  Medication Dose Route Frequency Provider Last Rate Last Admin  . gadopentetate dimeglumine  (MAGNEVIST) injection 18 mL  18 mL Intravenous Once PRN Penumalli, Vikram R, MD        ROS: Review of Systems  Constitutional: Negative for activity change, appetite change, chills, fatigue and unexpected weight change.  HENT: Negative for congestion, mouth sores and sinus pressure.   Eyes: Negative for visual disturbance.  Respiratory: Negative for cough and chest tightness.   Gastrointestinal: Negative for abdominal pain and nausea.  Genitourinary: Negative for difficulty urinating, frequency and vaginal pain.  Musculoskeletal: Positive for back pain. Negative for gait problem.  Skin: Negative for pallor and rash.  Neurological: Negative for dizziness, tremors, weakness, numbness and headaches.  Psychiatric/Behavioral: Positive for decreased concentration. Negative for confusion, sleep disturbance and suicidal ideas.    Objective:  BP 140/84 (BP Location: Left Arm, Patient Position: Sitting, Cuff Size: Normal)   Pulse 98   Temp 98.5 F (36.9 C) (Oral)   Ht 5\' 4"  (1.626 m)   Wt 204 lb (92.5 kg)   LMP 04/11/2012   SpO2 96%   BMI 35.02 kg/m   BP Readings from Last 3 Encounters:  10/14/19 140/84  07/12/19 126/78  12/11/18 138/84    Wt Readings from Last 3 Encounters:  10/14/19 204 lb (92.5 kg)  07/12/19 207 lb (93.9 kg)  12/11/18 211 lb (95.7 kg)    Physical Exam Constitutional:      General:  She is not in acute distress.    Appearance: She is well-developed.  HENT:     Head: Normocephalic.     Right Ear: External ear normal.     Left Ear: External ear normal.     Nose: Nose normal.  Eyes:     General:        Right eye: No discharge.        Left eye: No discharge.     Conjunctiva/sclera: Conjunctivae normal.     Pupils: Pupils are equal, round, and reactive to light.  Neck:     Thyroid: No thyromegaly.     Vascular: No JVD.     Trachea: No tracheal deviation.  Cardiovascular:     Rate and Rhythm: Normal rate and regular rhythm.     Heart sounds: Normal  heart sounds.  Pulmonary:     Effort: No respiratory distress.     Breath sounds: No stridor. No wheezing.  Abdominal:     General: Bowel sounds are normal. There is no distension.     Palpations: Abdomen is soft. There is no mass.     Tenderness: There is no abdominal tenderness. There is no guarding or rebound.  Musculoskeletal:        General: No tenderness.     Cervical back: Normal range of motion and neck supple.  Lymphadenopathy:     Cervical: No cervical adenopathy.  Skin:    Findings: No erythema or rash.  Neurological:     Cranial Nerves: No cranial nerve deficit.     Motor: No abnormal muscle tone.     Coordination: Coordination normal.     Deep Tendon Reflexes: Reflexes normal.  Psychiatric:        Behavior: Behavior normal.        Thought Content: Thought content normal.        Judgment: Judgment normal.    LS painful   Lab Results  Component Value Date   WBC 16.5 (H) 02/09/2018   HGB 14.1 02/09/2018   HCT 43.2 02/09/2018   PLT 330 02/09/2018   GLUCOSE 88 02/09/2018   CHOL 178 08/23/2008   TRIG 47 08/23/2008   HDL 52.7 08/23/2008   LDLCALC 116 (H) 08/23/2008   ALT 36 02/09/2018   AST 24 02/09/2018   NA 140 02/09/2018   K 4.3 02/09/2018   CL 101 02/09/2018   CREATININE 0.69 02/09/2018   BUN 14 02/09/2018   CO2 28 02/09/2018   TSH 5.12 (H) 10/21/2017    No results found.  Assessment & Plan:   There are no diagnoses linked to this encounter.   No orders of the defined types were placed in this encounter.    Follow-up: No follow-ups on file.  Walker Kehr, MD

## 2019-10-14 NOTE — Assessment & Plan Note (Addendum)
Vyvance   Potential benefits of a long term stimulant use as well as potential risks  and complications were explained to the patient and were aknowledged.

## 2019-10-14 NOTE — Assessment & Plan Note (Signed)
Symbicort MDI

## 2019-10-14 NOTE — Assessment & Plan Note (Signed)
Norco  Potential benefits of a long term opioids use as well as potential risks (i.e. addiction risk, apnea etc) and complications (i.e. Somnolence, constipation and others) were explained to the patient and were aknowledged. 

## 2020-01-03 ENCOUNTER — Other Ambulatory Visit: Payer: Self-pay

## 2020-01-03 NOTE — Telephone Encounter (Signed)
Medication Refill - Medication: HYDROcodone-acetaminophen (NORCO/VICODIN) 5-325 MG tablet   Has the patient contacted their pharmacy? Yes.   (Agent: If no, request that the patient contact the pharmacy for the refill.) (Agent: If yes, when and what did the pharmacy advise?)  Preferred Pharmacy (with phone number or street name): [facility] [phone] - [place], [place]: Please be advised that RX refills may take up to 3 business days. We ask that you follow-up with your pharmacy.

## 2020-01-04 MED ORDER — HYDROCODONE-ACETAMINOPHEN 5-325 MG PO TABS: ORAL_TABLET | ORAL | 0 refills | Status: DC

## 2020-01-12 ENCOUNTER — Ambulatory Visit: Payer: 59 | Admitting: Internal Medicine

## 2020-01-12 DIAGNOSIS — Z0289 Encounter for other administrative examinations: Secondary | ICD-10-CM

## 2020-04-28 ENCOUNTER — Telehealth: Payer: Self-pay

## 2020-04-28 ENCOUNTER — Ambulatory Visit
Admission: EM | Admit: 2020-04-28 | Discharge: 2020-04-28 | Disposition: A | Discharge status: Home / Self Care | Payer: 59 | Attending: Physician Assistant | Admitting: Physician Assistant

## 2020-04-28 ENCOUNTER — Other Ambulatory Visit: Payer: Self-pay

## 2020-04-28 DIAGNOSIS — T24201A Burn of second degree of unspecified site of right lower limb, except ankle and foot, initial encounter: Secondary | ICD-10-CM

## 2020-04-28 MED ORDER — MUPIROCIN 2 % EX OINT: 1.0000 "application " | TOPICAL_OINTMENT | Freq: Two times a day (BID) | CUTANEOUS | 0 refills | Status: DC

## 2020-04-28 MED ORDER — FLUCONAZOLE 150 MG PO TABS: 150.0000 mg | ORAL_TABLET | Freq: Every day | ORAL | 0 refills | Status: DC

## 2020-04-28 MED ORDER — CEPHALEXIN 500 MG PO CAPS: 500.0000 mg | ORAL_CAPSULE | Freq: Four times a day (QID) | ORAL | 0 refills | Status: DC

## 2020-04-28 NOTE — Telephone Encounter (Signed)
New message   The patient calling C/o right leg swollen for couple of days the patient made an appt with NP on Monday, July 26 @ 1:00 pm   The patient is asking the call be transfer to the triage nurse for assessment   Call transfer over to [facility]

## 2020-04-28 NOTE — Discharge Instructions (Addendum)
Keflex as directed. Keep wound clean and dry. Can clean gently with soap and water. Do not use alcohol or hydrogen peroxide. Dress with bactroban as needed. Monitor for spreading redness, warmth, fever, follow up for reevaluation.
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
## 2020-05-01 ENCOUNTER — Other Ambulatory Visit: Payer: Self-pay | Admitting: Internal Medicine

## 2020-05-01 ENCOUNTER — Other Ambulatory Visit: Payer: Self-pay

## 2020-05-01 ENCOUNTER — Ambulatory Visit: Payer: 59 | Admitting: Family

## 2020-05-01 DIAGNOSIS — F329 Major depressive disorder, single episode, unspecified: Secondary | ICD-10-CM

## 2020-05-01 DIAGNOSIS — F32A Depression, unspecified: Secondary | ICD-10-CM

## 2020-05-01 DIAGNOSIS — G8929 Other chronic pain: Secondary | ICD-10-CM | POA: Diagnosis not present

## 2020-05-01 DIAGNOSIS — M5440 Lumbago with sciatica, unspecified side: Secondary | ICD-10-CM

## 2020-05-01 DIAGNOSIS — M79605 Pain in left leg: Secondary | ICD-10-CM

## 2020-05-01 MED ORDER — MUPIROCIN 2 % EX OINT: 1.0000 "application " | TOPICAL_OINTMENT | Freq: Two times a day (BID) | CUTANEOUS | 0 refills | Status: DC

## 2020-05-01 MED ORDER — HYDROCODONE-ACETAMINOPHEN 5-325 MG PO TABS: 1.0000 | ORAL_TABLET | Freq: Four times a day (QID) | ORAL | 0 refills | Status: DC | PRN

## 2020-05-01 NOTE — Telephone Encounter (Signed)
1.Medication Requested: VYVANSE 50 MG capsule  2. Pharmacy (Name, Street, City):[facility] [place], [place]  3. On Med List: yes  4. Last Visit with PCP: 10/14/19  5. Next visit date with PCP:   Agent: Please be advised that RX refills may take up to 3 business days. We ask that you follow-up with your pharmacy.
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
## 2020-05-02 NOTE — Telephone Encounter (Deleted)
See med refill.

## 2020-05-04 NOTE — Telephone Encounter (Signed)
F/u OV w/us pls Thx

## 2020-05-04 NOTE — Telephone Encounter (Signed)
Called pt, unable to leave vm will try again

## 2020-05-05 MED ORDER — VYVANSE 50 MG PO CAPS: 50.0000 mg | ORAL_CAPSULE | ORAL | 0 refills | Status: DC

## 2020-05-05 NOTE — Telephone Encounter (Signed)
Called pt, unable to leave vvm

## 2020-05-08 NOTE — Telephone Encounter (Signed)
Tried calling pt again, no answer and mailbox full

## 2020-05-12 NOTE — Telephone Encounter (Signed)
Tried calling pt again, no answer and unable to leave vm

## 2020-06-03 ENCOUNTER — Encounter: Payer: Self-pay | Admitting: Family

## 2020-06-05 ENCOUNTER — Other Ambulatory Visit: Payer: Self-pay | Admitting: Family

## 2020-06-05 MED ORDER — HYDROCODONE-ACETAMINOPHEN 5-325 MG PO TABS: 1.0000 | ORAL_TABLET | Freq: Four times a day (QID) | ORAL | 0 refills | Status: DC | PRN

## 2020-06-05 MED ORDER — VYVANSE 50 MG PO CAPS: 50.0000 mg | ORAL_CAPSULE | ORAL | 0 refills | Status: DC

## 2020-07-11 ENCOUNTER — Encounter: Payer: Self-pay | Admitting: Family

## 2020-07-12 ENCOUNTER — Other Ambulatory Visit: Payer: Self-pay | Admitting: Internal Medicine

## 2020-07-12 MED ORDER — HYDROCODONE-ACETAMINOPHEN 5-325 MG PO TABS: 1.0000 | ORAL_TABLET | Freq: Four times a day (QID) | ORAL | 0 refills | Status: DC | PRN

## 2020-07-31 ENCOUNTER — Encounter: Payer: Self-pay | Admitting: Family

## 2020-07-31 NOTE — Telephone Encounter (Signed)
Duplicate msg's pt has sent 3 emails requesting same medications.Marland KitchenJohny Cross

## 2020-08-01 ENCOUNTER — Other Ambulatory Visit: Payer: Self-pay | Admitting: Internal Medicine

## 2020-08-01 MED ORDER — HYDROCODONE-ACETAMINOPHEN 5-325 MG PO TABS: 1.0000 | ORAL_TABLET | Freq: Three times a day (TID) | ORAL | 0 refills | Status: DC | PRN

## 2020-08-01 MED ORDER — VYVANSE 50 MG PO CAPS: 50.0000 mg | ORAL_CAPSULE | ORAL | 0 refills | Status: DC

## 2020-08-10 ENCOUNTER — Ambulatory Visit: Payer: 59 | Admitting: Internal Medicine

## 2020-08-10 DIAGNOSIS — Z0289 Encounter for other administrative examinations: Secondary | ICD-10-CM

## 2020-09-25 ENCOUNTER — Encounter: Payer: Self-pay | Admitting: Internal Medicine

## 2020-09-25 ENCOUNTER — Ambulatory Visit: Payer: 59 | Admitting: Internal Medicine

## 2020-09-25 ENCOUNTER — Other Ambulatory Visit: Payer: Self-pay

## 2020-09-25 DIAGNOSIS — J4530 Mild persistent asthma, uncomplicated: Secondary | ICD-10-CM | POA: Diagnosis not present

## 2020-09-25 DIAGNOSIS — D171 Benign lipomatous neoplasm of skin and subcutaneous tissue of trunk: Secondary | ICD-10-CM | POA: Diagnosis not present

## 2020-09-25 DIAGNOSIS — F909 Attention-deficit hyperactivity disorder, unspecified type: Secondary | ICD-10-CM | POA: Diagnosis not present

## 2020-09-25 DIAGNOSIS — M5440 Lumbago with sciatica, unspecified side: Secondary | ICD-10-CM | POA: Diagnosis not present

## 2020-09-25 DIAGNOSIS — D179 Benign lipomatous neoplasm, unspecified: Secondary | ICD-10-CM | POA: Insufficient documentation

## 2020-09-25 DIAGNOSIS — G8929 Other chronic pain: Secondary | ICD-10-CM

## 2020-09-25 MED ORDER — LISDEXAMFETAMINE DIMESYLATE 50 MG PO CAPS: 50.0000 mg | ORAL_CAPSULE | ORAL | 0 refills | Status: DC

## 2020-09-25 MED ORDER — HYDROCODONE-ACETAMINOPHEN 10-325 MG PO TABS: 1.0000 | ORAL_TABLET | Freq: Three times a day (TID) | ORAL | 0 refills | Status: AC | PRN

## 2020-09-25 MED ORDER — VITAMIN D3 50 MCG (2000 UT) PO CAPS: 2000.0000 [IU] | ORAL_CAPSULE | Freq: Every day | ORAL | 3 refills | Status: DC

## 2020-09-25 NOTE — Assessment & Plan Note (Signed)
Enlarging, painful - R flank Surgicl ref

## 2020-09-25 NOTE — Assessment & Plan Note (Signed)
Using Symbicort MDI periodically

## 2020-09-25 NOTE — Assessment & Plan Note (Signed)
Vyvance 50 mg q am   Potential benefits of a long term stimulant use as well as potential risks  and complications were explained to the patient and were aknowledged.

## 2020-09-25 NOTE — Assessment & Plan Note (Signed)
Wt Readings from Last 3 Encounters:  09/25/20 203 lb 12.8 oz (92.4 kg)  10/14/19 204 lb (92.5 kg)  07/12/19 207 lb (93.9 kg)

## 2020-09-25 NOTE — Patient Instructions (Addendum)
B-complex with Niacin 100 mg    Lion's mane  Start yoga - deep stretching Lipoma  A lipoma is a noncancerous (benign) tumor that is made up of fat cells. This is a very common type of soft-tissue growth. Lipomas are usually found under the skin (subcutaneous). They may occur in any tissue of the body that contains fat. Common areas for lipomas to appear include the back, arms, shoulders, buttocks, and thighs. Lipomas grow slowly, and they are usually painless. Most lipomas do not cause problems and do not require treatment. What are the causes? The cause of this condition is not known. What increases the risk? You are more likely to develop this condition if:  You are 52-23 years old.  You have a family history of lipomas. What are the signs or symptoms? A lipoma usually appears as a small, round bump under the skin. In most cases, the lump will:  Feel soft or rubbery.  Not cause pain or other symptoms. However, if a lipoma is located in an area where it pushes on nerves, it can become painful or cause other symptoms. How is this diagnosed? A lipoma can usually be diagnosed with a physical exam. You may also have tests to confirm the diagnosis and to rule out other conditions. Tests may include:  Imaging tests, such as a CT scan or an MRI.  Removal of a tissue sample to be looked at under a microscope (biopsy). How is this treated? Treatment for this condition depends on the size of the lipoma and whether it is causing any symptoms.  For small lipomas that are not causing problems, no treatment is needed.  If a lipoma is bigger or it causes problems, surgery may be done to remove the lipoma. Lipomas can also be removed to improve appearance. Most often, the procedure is done after applying a medicine that numbs the area (local anesthetic).  Liposuction may be done to reduce the size of the lipoma before it is removed through surgery, or it may be done to remove the lipoma.  Lipomas are removed with this method in order to limit incision size and scarring. A liposuction tube is inserted through a small incision into the lipoma, and the contents of the lipoma are removed through the tube with suction. Follow these instructions at home:  Watch your lipoma for any changes.  Keep all follow-up visits as told by your health care provider. This is important. Contact a health care provider if:  Your lipoma becomes larger or hard.  Your lipoma becomes painful, red, or increasingly swollen. These could be signs of infection or a more serious condition. Get help right away if:  You develop tingling or numbness in an area near the lipoma. This could indicate that the lipoma is causing nerve damage. Summary  A lipoma is a noncancerous tumor that is made up of fat cells.  Most lipomas do not cause problems and do not require treatment.  If a lipoma is bigger or it causes problems, surgery may be done to remove the lipoma.  Contact a health care provider if your lipoma becomes larger or hard, or if it becomes painful, red, or increasingly swollen. Pain, redness, and swelling could be signs of infection or a more serious condition. This information is not intended to replace advice given to you by your health care provider. Make sure you discuss any questions you have with your health care provider. Document Revised: 05/10/2019 Document Reviewed: 05/10/2019 Elsevier Patient Education  2020 Elsevier Inc.   

## 2020-09-25 NOTE — Assessment & Plan Note (Signed)
Norco 10/325 po qid prn w/caution  Potential benefits of a long term opioids use as well as potential risks (i.e. addiction risk, apnea etc) and complications (i.e. Somnolence, constipation and others) were explained to the patient and were aknowledged. Gabapentin prn Try Lion's mane/ B complex

## 2020-09-25 NOTE — Progress Notes (Signed)
Subjective:  Patient ID: Tamara Cross, female    DOB: 1966/07/27  Age: 54 y.o. MRN: [id number]  CC: Mass (On her back. Also requesting refill on her vyvanse & hydrocodone)   HPI Tamara Cross presents for chronic LBP, ADD, HTN  f/u C/o painful lump on the back on the R that her husband found recently...  Outpatient Medications Prior to Visit  Medication Sig Dispense Refill  . acyclovir cream (ZOVIRAX) 5 % Apply 1 application topically every 4 (four) hours. 15 g 0  . albuterol (VENTOLIN HFA) 108 (90 Base) MCG/ACT inhaler Inhale 1-2 puffs into the lungs every 6 (six) hours as needed for wheezing or shortness of breath. 3 Inhaler 3  . budesonide-formoterol (SYMBICORT) 160-4.5 MCG/ACT inhaler Inhale 2 puffs into the lungs 2 (two) times daily. 3 Inhaler 3  . cetirizine (ZYRTEC) 10 MG tablet Take 10 mg by mouth daily.    [name] desvenlafaxine (PRISTIQ) 100 MG 24 hr tablet Take 1 tablet (100 mg total) by mouth daily. 90 tablet 3  . HYDROcodone-acetaminophen (NORCO) 5-325 MG tablet Take 1 tablet by mouth every 8 (eight) hours as needed for severe pain. 90 tablet 0  . metoprolol succinate (TOPROL-XL) 50 MG 24 hr tablet Take 1 tablet (50 mg total) by mouth daily. Take with or immediately following a meal. 30 tablet 11  . mupirocin ointment (BACTROBAN) 2 % Apply 1 application topically 2 (two) times daily. 30 g 0  . valACYclovir (VALTREX) 500 MG tablet Take 1 tablet (500 mg total) by mouth daily. Increase to bid prn with outbreak 60 tablet 11  . VYVANSE 50 MG capsule Take 1 capsule (50 mg total) by mouth every morning. 30 capsule 0  . cephALEXin (KEFLEX) 500 MG capsule Take 1 capsule (500 mg total) by mouth 4 (four) times daily. (Patient not taking: Reported on 09/25/2020) 28 capsule 0  . fluconazole (DIFLUCAN) 150 MG tablet Take 1 tablet (150 mg total) by mouth daily. Take second dose 72 hours later if symptoms still persists. (Patient not taking: Reported on 09/25/2020) 2 tablet 0    Facility-Administered Medications Prior to Visit  Medication Dose Route Frequency Provider Last Rate Last Admin  . gadopentetate dimeglumine (MAGNEVIST) injection 18 mL  18 mL Intravenous Once PRN Penumalli, Vikram R, MD        ROS: Review of Systems  Constitutional: Positive for unexpected weight change. Negative for activity change, appetite change, chills and fatigue.  HENT: Negative for congestion, mouth sores and sinus pressure.   Eyes: Negative for visual disturbance.  Respiratory: Negative for cough and chest tightness.   Gastrointestinal: Negative for abdominal pain and nausea.  Genitourinary: Negative for difficulty urinating, frequency and vaginal pain.  Musculoskeletal: Positive for back pain and gait problem.  Skin: Negative for pallor and rash.  Neurological: Negative for dizziness, tremors, weakness, numbness and headaches.  Psychiatric/Behavioral: Negative for confusion and sleep disturbance.    Objective:  BP 122/70 (BP Location: Left Arm)   Pulse 91   Temp 98.6 F (37 C) (Oral)   Wt 203 lb 12.8 oz (92.4 kg)   LMP 04/11/2012   SpO2 96%   BMI 34.98 kg/m   BP Readings from Last 3 Encounters:  09/25/20 122/70  04/28/20 (!) 149/85  10/14/19 140/84    Wt Readings from Last 3 Encounters:  09/25/20 203 lb 12.8 oz (92.4 kg)  10/14/19 204 lb (92.5 kg)  07/12/19 207 lb (93.9 kg)    Physical Exam Constitutional:  General: She is not in acute distress.    Appearance: She is well-developed. She is obese.  HENT:     Head: Normocephalic.     Right Ear: External ear normal.     Left Ear: External ear normal.     Nose: Nose normal.     Mouth/Throat:     Mouth: Oropharynx is clear and moist.  Eyes:     General:        Right eye: No discharge.        Left eye: No discharge.     Conjunctiva/sclera: Conjunctivae normal.     Pupils: Pupils are equal, round, and reactive to light.  Neck:     Thyroid: No thyromegaly.     Vascular: No JVD.     Trachea: No  tracheal deviation.  Cardiovascular:     Rate and Rhythm: Normal rate and regular rhythm.     Heart sounds: Normal heart sounds.  Pulmonary:     Effort: No respiratory distress.     Breath sounds: No stridor. No wheezing.  Abdominal:     General: Bowel sounds are normal. There is no distension.     Palpations: Abdomen is soft. There is no mass.     Tenderness: There is no abdominal tenderness. There is no guarding or rebound.  Musculoskeletal:        General: Tenderness present. No edema.     Cervical back: Normal range of motion and neck supple.  Lymphadenopathy:     Cervical: No cervical adenopathy.  Skin:    Findings: No erythema or rash.  Neurological:     Mental Status: She is oriented to person, place, and time.     Cranial Nerves: No cranial nerve deficit.     Motor: No abnormal muscle tone.     Coordination: Coordination normal.     Deep Tendon Reflexes: Reflexes normal.  Psychiatric:        Mood and Affect: Mood and affect normal.        Behavior: Behavior normal.        Thought Content: Thought content normal.        Judgment: Judgment normal.    LS w/pain R post chest wall egg sized oval mass, painful and mobile     A total time of >45 minutes was spent preparing to see the patient, reviewing tests, x-rays, operative reports and outside records.  Also, obtaining history and performing comprehensive physical exam.  Additionally, counseling the patient regarding the above listed issues and a new mass on the chest wall.   Finally, documenting clinical information in the health records, coordination of care, educating the patient.  Lab Results  Component Value Date   WBC 16.5 (H) 02/09/2018   HGB 14.1 02/09/2018   HCT 43.2 02/09/2018   PLT 330 02/09/2018   GLUCOSE 88 02/09/2018   CHOL 178 08/23/2008   TRIG 47 08/23/2008   HDL 52.7 08/23/2008   LDLCALC 116 (H) 08/23/2008   ALT 36 02/09/2018   AST 24 02/09/2018   NA 140 02/09/2018   K 4.3 02/09/2018   CL 101  02/09/2018   CREATININE 0.69 02/09/2018   BUN 14 02/09/2018   CO2 28 02/09/2018   TSH 5.12 (H) 10/21/2017    No results found.  Assessment & Plan:    Walker Kehr, MD

## 2020-11-10 ENCOUNTER — Other Ambulatory Visit: Payer: Self-pay

## 2020-11-10 ENCOUNTER — Encounter (HOSPITAL_BASED_OUTPATIENT_CLINIC_OR_DEPARTMENT_OTHER): Payer: Self-pay | Admitting: Surgery

## 2020-11-14 ENCOUNTER — Other Ambulatory Visit (HOSPITAL_COMMUNITY): Payer: 59

## 2020-11-16 ENCOUNTER — Telehealth: Payer: Self-pay | Admitting: Internal Medicine

## 2020-11-16 NOTE — Telephone Encounter (Signed)
lisdexamfetamine (VYVANSE) 50 MG capsule  HYDROcodone-acetaminophen (NORCO) 7.5-325 MG tablet  [id number] - [place], [place] - [address] Phone:  [phone]  Fax:  [phone]     Patient requesting a refill Last seen- 12.20.21 Next-03.30.22

## 2020-11-17 ENCOUNTER — Ambulatory Visit (HOSPITAL_BASED_OUTPATIENT_CLINIC_OR_DEPARTMENT_OTHER): Admission: RE | Admit: 2020-11-17 | Payer: 59 | Source: Home / Self Care | Admitting: Surgery

## 2020-11-17 ENCOUNTER — Other Ambulatory Visit (HOSPITAL_COMMUNITY): Payer: Self-pay | Admitting: *Deleted

## 2020-11-17 HISTORY — DX: Attention-deficit hyperactivity disorder, unspecified type: F90.9

## 2020-11-17 HISTORY — DX: Unspecified asthma, uncomplicated: J45.909

## 2020-11-17 SURGERY — EXCISION LIPOMA
Anesthesia: General | Laterality: Right

## 2020-11-17 MED ORDER — HYDROCODONE-ACETAMINOPHEN 10-325 MG PO TABS: 0.5000 | ORAL_TABLET | Freq: Four times a day (QID) | ORAL | 0 refills | Status: DC | PRN

## 2020-11-17 MED ORDER — LISDEXAMFETAMINE DIMESYLATE 50 MG PO CAPS: 50.0000 mg | ORAL_CAPSULE | ORAL | 0 refills | Status: DC

## 2020-11-17 NOTE — Telephone Encounter (Signed)
Okay.  Thanks.

## 2020-11-18 ENCOUNTER — Other Ambulatory Visit (HOSPITAL_COMMUNITY)
Admission: RE | Admit: 2020-11-18 | Discharge: 2020-11-18 | Disposition: A | Discharge status: Home / Self Care | Payer: 59 | Source: Ambulatory Visit | Attending: Surgery | Admitting: Surgery

## 2020-11-18 DIAGNOSIS — Z7951 Long term (current) use of inhaled steroids: Secondary | ICD-10-CM | POA: Diagnosis not present

## 2020-11-18 DIAGNOSIS — Z79899 Other long term (current) drug therapy: Secondary | ICD-10-CM | POA: Diagnosis not present

## 2020-11-18 DIAGNOSIS — Z882 Allergy status to sulfonamides status: Secondary | ICD-10-CM | POA: Diagnosis not present

## 2020-11-18 DIAGNOSIS — Z20822 Contact with and (suspected) exposure to covid-19: Secondary | ICD-10-CM | POA: Diagnosis not present

## 2020-11-18 DIAGNOSIS — F17200 Nicotine dependence, unspecified, uncomplicated: Secondary | ICD-10-CM | POA: Diagnosis not present

## 2020-11-18 DIAGNOSIS — Z888 Allergy status to other drugs, medicaments and biological substances status: Secondary | ICD-10-CM | POA: Diagnosis not present

## 2020-11-18 DIAGNOSIS — R222 Localized swelling, mass and lump, trunk: Secondary | ICD-10-CM | POA: Diagnosis present

## 2020-11-19 LAB — SARS CORONAVIRUS 2 (TAT 6-24 HRS): SARS Coronavirus 2: NEGATIVE

## 2020-11-20 ENCOUNTER — Encounter (HOSPITAL_COMMUNITY): Payer: Self-pay

## 2020-11-20 ENCOUNTER — Other Ambulatory Visit: Payer: Self-pay

## 2020-11-20 ENCOUNTER — Encounter (HOSPITAL_COMMUNITY)
Admission: RE | Admit: 2020-11-20 | Discharge: 2020-11-20 | Disposition: A | Discharge status: Home / Self Care | Payer: 59 | Source: Ambulatory Visit | Attending: Surgery | Admitting: Surgery

## 2020-11-20 MED ORDER — BUPIVACAINE LIPOSOME 1.3 % IJ SUSP
20.0000 mL | INTRAMUSCULAR | Status: DC
  Filled 2020-11-20: qty 20

## 2020-11-20 NOTE — H&P (Signed)
Tamara Cross Appointment: 10/18/2020 2:00 PM Location: [place] Patient #: [id number] DOB: Mar 02, 1966 Married / Language: Tamara Cross / Race: White Female   History of Present Illness Tamara Cross Done MD; 10/18/2020 2:45 PM) The patient is a 55 year old female who presents with a complaint of Mass. Tamara Cross had breast cancer surgery bilaterally by Dr. Lucia Gaskins. On her right side she had irradiation. within the last few months they have discovered an egg-shaped mass on the right side below her scapula in the middle between her spine and the lateral side. It is nontender. There appears to be freely movable. She was referred by Dr. Cristie Hem Plotnikov. It is probably about 4 centimeters in diameter or about the size of an egg.   Past Surgical History Tamara Cross, CMA; 10/18/2020 1:59 PM) Anal Fissure Repair  Appendectomy  Breast Biopsy  Bilateral. multiple Breast Mass; Local Excision  Bilateral. Cesarean Section - 1  Gallbladder Surgery - Laparoscopic  Oral Surgery  Shoulder Surgery  Left. Spinal Surgery - Lower Back  Tonsillectomy   Diagnostic Studies History Tamara Cross, CMA; 10/18/2020 1:59 PM) Colonoscopy  1-5 years ago within last year Mammogram  >3 years ago within last year Pap Smear  1-5 years ago  Allergies Tamara Cross, CMA; 10/18/2020 1:59 PM) Imitrex *MIGRAINE PRODUCTS*  Sulfa Antibiotics  Reglan *GASTROINTESTINAL AGENTS - MISC.*  Zofran *ANTIEMETICS*   Medication History Tamara Cross, CMA; 10/18/2020 2:02 PM) Ventolin (90MCG/ACT Aerosol Soln, Inhalation) Active. Symbicort (160-4.5MCG/ACT Aerosol, Inhalation) Active. Cetirizine HCl (10MG  Tablet, Oral) Active. Vitamin D3 (50 MCG(2000 UT) Capsule, Oral) Active. Desvenlafaxine Succinate ER (100MG  Tablet ER 24HR, Oral) Active. Vyvanse (50MG  Capsule, Oral) Active. Metoprolol Succinate ER (50MG  Tablet ER 24HR, Oral) Active. Bactroban (2% Cream, External)  Active. valACYclovir HCl (500MG  Tablet, Oral) Active. Medications Reconciled  Social History Tamara Cross, CMA; 10/18/2020 1:59 PM) Alcohol use  Occasional alcohol use. Caffeine use  Carbonated beverages, Coffee. Illicit drug use  Remotely quit drug use. Tobacco use  Current every day smoker, Current some day smoker.  Family History Tamara Cross, CMA; 10/18/2020 1:59 PM) Alcohol Abuse  Brother, Father, Mother. Arthritis  Mother. Cerebrovascular Accident  Mother.  Pregnancy / Birth History Tamara Cross, CMA; 10/18/2020 1:59 PM) Age at menarche  61 years, 87 years. Age of menopause  63-50 Contraceptive History  Oral contraceptives. Gravida  2 Length (months) of breastfeeding  3-6 Maternal age  31-40 Para  1 Regular periods   Other Problems Tamara Cross, CMA; 10/18/2020 1:59 PM) Anxiety Disorder  Arthritis  Asthma  Atrial Fibrillation  Back Pain  Breast Cancer  Cancer  Cholelithiasis  Depression  Gastric Ulcer  Gastroesophageal Reflux Disease  Hemorrhoids  Lump In Breast  Migraine Headache  Pancreatitis     Review of Systems (Tamara Cross CMA; 10/18/2020 1:59 PM) General Not Present- Appetite Loss, Chills, Fatigue, Fever, Night Sweats, Weight Gain and Weight Loss. Skin Not Present- Change in Wart/Mole, Dryness, Hives, Jaundice, New Lesions, Non-Healing Wounds, Rash and Ulcer. HEENT Present- Nose Bleed, Ringing in the Ears, Seasonal Allergies and Wears glasses/contact lenses. Not Present- Earache, Hearing Loss, Hoarseness, Oral Ulcers, Sinus Pain, Sore Throat, Visual Disturbances and Yellow Eyes. Respiratory Present- Snoring and Wheezing. Not Present- Bloody sputum, Chronic Cough and Difficulty Breathing. Breast Not Present- Breast Mass, Breast Pain, Nipple Discharge and Skin Changes. Cardiovascular Present- Leg Cramps, Palpitations and Swelling of Extremities. Not Present- Chest Pain, Difficulty  Breathing Lying Down, Rapid Heart Rate and Shortness of Breath. Gastrointestinal Not Present- Abdominal Pain, Bloating, Bloody Stool, Change  in Bowel Habits, Chronic diarrhea, Constipation, Difficulty Swallowing, Excessive gas, Gets full quickly at meals, Hemorrhoids, Indigestion, Nausea, Rectal Pain and Vomiting. Female Genitourinary Present- Frequency and Urgency. Not Present- Nocturia, Painful Urination and Pelvic Pain. Musculoskeletal Present- Back Pain, Muscle Pain and Muscle Weakness. Not Present- Joint Pain, Joint Stiffness and Swelling of Extremities. Neurological Present- Headaches, Numbness and Tingling. Not Present- Decreased Memory, Fainting, Seizures, Tremor, Trouble walking and Weakness. Psychiatric Present- Anxiety. Not Present- Bipolar, Change in Sleep Pattern, Depression, Fearful and Frequent crying. Endocrine Present- Hot flashes. Not Present- Cold Intolerance, Excessive Hunger, Hair Changes, Heat Intolerance and New Diabetes.  Vitals (Tamara Cross CMA; 10/18/2020 1:59 PM) 10/18/2020 1:59 PM Weight: 199.8 lb Height: 64.5in Body Surface Area: 1.97 m Body Mass Index: 33.77 kg/m  Pulse: 82 (Regular)  BP: 126/80(Sitting, Left Arm, Standard)       Physical Exam (Tamara Cross B. Cross Done MD; 10/18/2020 2:46 PM) Musculoskeletal Note: there is an 8 shaped mass below the tip of the scapula on the right side. This is the same side as she had radiation for breast cancer. She also showed me a couple lipomas and one in her left anterior leg and although I couldn't feel that she said there is one hour on the right thigh as well. The area in question Tamara Cross is freely movable and soft and feels like a benign lipoma. We'll need to remove under general in the prone position because of its depth.     Assessment & Plan Tamara Cross Done MD; 10/18/2020 2:47 PM) LIPOMA OF BACK (D17.1) Impression: at least a 4 cm in size rubbery soft mass below the scapular tip on the right  consistent with a freely movable benign lipoma. However it has been getting larger according to the patient. I would recommend removing it and we will try to schedule this under general anesthesia in the prone position whenever covert restrictions will allow.  Signed by Tamara Earls, MD (10/18/2020 2:48 PM)

## 2020-11-20 NOTE — Patient Instructions (Signed)
DUE TO COVID-19 ONLY ONE VISITOR IS ALLOWED TO COME WITH YOU AND STAY IN THE WAITING ROOM ONLY DURING PRE OP AND PROCEDURE DAY OF SURGERY. THE 1 VISITOR  MAY VISIT WITH YOU AFTER SURGERY IN YOUR PRIVATE ROOM DURING VISITING HOURS ONLY!  ,  PLEASE BEGIN THE QUARANTINE INSTRUCTIONS AS OUTLINED IN YOUR HANDOUT.                Tamara Cross   Your procedure is scheduled on: 11/21/20   Report to [facility] Main  Entrance   Report to admitting at 1:00 PM     Call this number if you have problems the morning of surgery [phone]    Remember: Do not eat food or drink liquids :After Midnight  . BRUSH YOUR TEETH MORNING OF SURGERY AND RINSE YOUR MOUTH OUT, NO CHEWING GUM CANDY OR MINTS.     Take these medicines the morning of surgery with A SIP OF WATER:  Follow instructions from MD's office                                 You may not have any metal on your body including hair pins and              piercings  Do not wear jewelry, make-up, lotions, powders or perfumes, deodorant             Do not wear nail polish on your fingernails.  Do not shave  48 hours prior to surgery.             Do not bring valuables to the hospital. [place].  Contacts, dentures or bridgework may not be worn into surgery.       Patients discharged the day of surgery will not be allowed to drive home.   IF YOU ARE HAVING SURGERY AND GOING HOME THE SAME DAY, YOU MUST HAVE AN ADULT TO DRIVE YOU HOME AND BE WITH YOU FOR 24 HOURS. YOU MAY GO HOME BY TAXI OR UBER OR ORTHERWISE, BUT AN ADULT MUST ACCOMPANY YOU HOME AND STAY WITH YOU FOR 24 HOURS.  Name and phone number of your driver:  Special Instructions: N/A              Please read over the following fact sheets you were given: _____________________________________________________________________             [facility] - Preparing for Surgery(  Pt will use dial soap) Before surgery, you can  play an important role.  Because skin is not sterile, your skin needs to be as free of germs as possible.  You can reduce the number of germs on your skin by washing with CHG (chlorahexidine gluconate) soap before surgery.  CHG is an antiseptic cleaner which kills germs and bonds with the skin to continue killing germs even after washing. Please DO NOT use if you have an allergy to CHG or antibacterial soaps.  If your skin becomes reddened/irritated stop using the CHG and inform your nurse when you arrive at Short Stay. Do not shave (including legs and underarms) for at least 48 hours prior to the first CHG shower. . Please follow these instructions carefully:  1.  Shower with CHG Soap the night before surgery and the  morning of Surgery.  2.  If you choose to wash your hair, wash your hair first as usual with your  normal  shampoo.  3.  After you shampoo, rinse your hair and body thoroughly to remove the  shampoo.                                         4.  Use CHG as you would any other liquid soap.  You can apply chg directly  to the skin and wash                       Gently with a scrungie or clean washcloth.  5.  Apply the CHG Soap to your body ONLY FROM THE NECK DOWN.   Do not use on face/ open                           Wound or open sores. Avoid contact with eyes, ears mouth and genitals (private parts).                       Wash face,  Genitals (private parts) with your normal soap.             6.  Wash thoroughly, paying special attention to the area where your surgery  will be performed.  7.  Thoroughly rinse your body with warm water from the neck down.  8.  DO NOT shower/wash with your normal soap after using and rinsing off  the CHG Soap.                9.  Pat yourself dry with a clean towel.            10.  Wear clean pajamas.            11.  Place clean sheets on your bed the night of your first shower and do not  sleep with pets. Day of Surgery : Do not apply any  lotions/deodorants the morning of surgery.  Please wear clean clothes to the hospital/surgery center.  FAILURE TO FOLLOW THESE INSTRUCTIONS MAY RESULT IN THE CANCELLATION OF YOUR SURGERY PATIENT SIGNATURE_________________________________  NURSE SIGNATURE__________________________________  ________________________________________________________________________

## 2020-11-20 NOTE — Progress Notes (Signed)
COVID Vaccine Completed:No Date COVID Vaccine completed: COVID vaccine manufacturer: Pfizer    Moderna   [employer]   PCP - Dr. Alain Marion Cardiologist - Dr. Jens Som  Chest x-ray - no EKG - 11/10/20 -epic Stress Test - pt says she has had. Results WNL ECHO - Pt thinks in 2019 results WNL Cardiac Cath - no Pacemaker/ICD device last checkedno  Sleep Study - no CPAP -   Fasting Blood Sugar - NA Checks Blood Sugar _____ times a day  Blood Thinner Instructions:NA Aspirin Instructions: Last Dose:  Anesthesia review:   Patient denies shortness of breath, fever, cough and chest pain at PAT appointment  yes Patient verbalized understanding of instructions that were given to them at the PAT appointment. Patient was also instructed that they will need to review over the PAT instructions again at home before surgery. Yes Pt reports no SOB with any activities.

## 2020-11-21 ENCOUNTER — Ambulatory Visit (HOSPITAL_COMMUNITY): Payer: 59 | Admitting: Certified Registered Nurse Anesthetist

## 2020-11-21 ENCOUNTER — Encounter (HOSPITAL_COMMUNITY): Payer: Self-pay | Admitting: Surgery

## 2020-11-21 ENCOUNTER — Ambulatory Visit (HOSPITAL_COMMUNITY)
Admission: RE | Admit: 2020-11-21 | Discharge: 2020-11-21 | Disposition: A | Discharge status: Home / Self Care | Payer: 59 | Attending: Surgery | Admitting: Surgery

## 2020-11-21 ENCOUNTER — Encounter (HOSPITAL_COMMUNITY)
Admission: RE | Disposition: A | Discharge status: Home / Self Care | Payer: Self-pay | Source: Home / Self Care | Attending: Surgery

## 2020-11-21 DIAGNOSIS — Z20822 Contact with and (suspected) exposure to covid-19: Secondary | ICD-10-CM | POA: Insufficient documentation

## 2020-11-21 DIAGNOSIS — R222 Localized swelling, mass and lump, trunk: Secondary | ICD-10-CM | POA: Diagnosis not present

## 2020-11-21 DIAGNOSIS — Z888 Allergy status to other drugs, medicaments and biological substances status: Secondary | ICD-10-CM | POA: Insufficient documentation

## 2020-11-21 DIAGNOSIS — Z79899 Other long term (current) drug therapy: Secondary | ICD-10-CM | POA: Insufficient documentation

## 2020-11-21 DIAGNOSIS — Z7951 Long term (current) use of inhaled steroids: Secondary | ICD-10-CM | POA: Insufficient documentation

## 2020-11-21 DIAGNOSIS — F17200 Nicotine dependence, unspecified, uncomplicated: Secondary | ICD-10-CM | POA: Insufficient documentation

## 2020-11-21 DIAGNOSIS — Z882 Allergy status to sulfonamides status: Secondary | ICD-10-CM | POA: Insufficient documentation

## 2020-11-21 HISTORY — PX: LIPOMA EXCISION: SHX5283

## 2020-11-21 LAB — CBC
HCT: 45.7 % (ref 36.0–46.0)
Hemoglobin: 15.1 g/dL — ABNORMAL HIGH (ref 12.0–15.0)
MCH: 29.8 pg (ref 26.0–34.0)
MCHC: 33 g/dL (ref 30.0–36.0)
MCV: 90.3 fL (ref 80.0–100.0)
Platelets: 258 10*3/uL (ref 150–400)
RBC: 5.06 MIL/uL (ref 3.87–5.11)
RDW: 13.1 % (ref 11.5–15.5)
WBC: 6.2 10*3/uL (ref 4.0–10.5)
nRBC: 0 % (ref 0.0–0.2)

## 2020-11-21 LAB — BASIC METABOLIC PANEL
Anion gap: 10 (ref 5–15)
BUN: 18 mg/dL (ref 6–20)
CO2: 27 mmol/L (ref 22–32)
Calcium: 8.9 mg/dL (ref 8.9–10.3)
Chloride: 106 mmol/L (ref 98–111)
Creatinine, Ser: 0.62 mg/dL (ref 0.44–1.00)
GFR, Estimated: >60 mL/min (ref >60)
Glucose, Bld: 118 mg/dL — ABNORMAL HIGH (ref 70–99)
Potassium: 3.3 mmol/L — ABNORMAL LOW (ref 3.5–5.1)
Sodium: 143 mmol/L (ref 135–145)

## 2020-11-21 SURGERY — EXCISION LIPOMA
Anesthesia: General | Laterality: Right

## 2020-11-21 MED ORDER — LIDOCAINE 2% (20 MG/ML) 5 ML SYRINGE
INTRAMUSCULAR | Status: DC | PRN
  Administered 2020-11-21: 40 mg via INTRAVENOUS

## 2020-11-21 MED ORDER — ACETAMINOPHEN 325 MG PO TABS: 325.0000 mg | ORAL_TABLET | Freq: Once | ORAL | Status: DC | PRN

## 2020-11-21 MED ORDER — PROPOFOL 10 MG/ML IV BOLUS
INTRAVENOUS | Status: AC
  Filled 2020-11-21: qty 20

## 2020-11-21 MED ORDER — FENTANYL CITRATE (PF) 100 MCG/2ML IJ SOLN
INTRAMUSCULAR | Status: AC
  Filled 2020-11-21: qty 2

## 2020-11-21 MED ORDER — ACETAMINOPHEN 160 MG/5ML PO SOLN: 325.0000 mg | Freq: Once | ORAL | Status: DC | PRN

## 2020-11-21 MED ORDER — LIDOCAINE HCL (PF) 2 % IJ SOLN
INTRAMUSCULAR | Status: AC
  Filled 2020-11-21: qty 5

## 2020-11-21 MED ORDER — ACETAMINOPHEN 500 MG PO TABS
1000.0000 mg | ORAL_TABLET | ORAL | Status: AC
  Administered 2020-11-21: 1000 mg via ORAL
  Filled 2020-11-21: qty 2

## 2020-11-21 MED ORDER — LACTATED RINGERS IV SOLN: INTRAVENOUS | Status: DC

## 2020-11-21 MED ORDER — DEXAMETHASONE SODIUM PHOSPHATE 10 MG/ML IJ SOLN
INTRAMUSCULAR | Status: AC
  Filled 2020-11-21: qty 1

## 2020-11-21 MED ORDER — CEFAZOLIN SODIUM-DEXTROSE 2-4 GM/100ML-% IV SOLN
2.0000 g | INTRAVENOUS | Status: AC
  Administered 2020-11-21: 2 g via INTRAVENOUS
  Filled 2020-11-21: qty 100

## 2020-11-21 MED ORDER — AMISULPRIDE (ANTIEMETIC) 5 MG/2ML IV SOLN
INTRAVENOUS | Status: AC
  Filled 2020-11-21: qty 2

## 2020-11-21 MED ORDER — MIDAZOLAM HCL 2 MG/2ML IJ SOLN
INTRAMUSCULAR | Status: DC | PRN
  Administered 2020-11-21: 2 mg via INTRAVENOUS

## 2020-11-21 MED ORDER — SCOPOLAMINE 1 MG/3DAYS TD PT72
1.0000 | MEDICATED_PATCH | TRANSDERMAL | Status: DC
  Administered 2020-11-21: 1.5 mg via TRANSDERMAL
  Filled 2020-11-21: qty 1

## 2020-11-21 MED ORDER — KETOROLAC TROMETHAMINE 30 MG/ML IJ SOLN
INTRAMUSCULAR | Status: DC | PRN
  Administered 2020-11-21: 30 mg via INTRAVENOUS

## 2020-11-21 MED ORDER — HYDROCODONE-ACETAMINOPHEN 5-325 MG PO TABS: 1.0000 | ORAL_TABLET | Freq: Four times a day (QID) | ORAL | 0 refills | Status: DC | PRN

## 2020-11-21 MED ORDER — MEPERIDINE HCL 50 MG/ML IJ SOLN: 6.2500 mg | INTRAMUSCULAR | Status: DC | PRN

## 2020-11-21 MED ORDER — AMISULPRIDE (ANTIEMETIC) 5 MG/2ML IV SOLN
10.0000 mg | Freq: Once | INTRAVENOUS | Status: AC | PRN
  Administered 2020-11-21: 10 mg via INTRAVENOUS

## 2020-11-21 MED ORDER — PROPOFOL 10 MG/ML IV BOLUS
INTRAVENOUS | Status: DC | PRN
  Administered 2020-11-21: 200 mg via INTRAVENOUS

## 2020-11-21 MED ORDER — FENTANYL CITRATE (PF) 100 MCG/2ML IJ SOLN
INTRAMUSCULAR | Status: DC | PRN
  Administered 2020-11-21 (×3): 50 ug via INTRAVENOUS

## 2020-11-21 MED ORDER — LACTATED RINGERS IV SOLN: INTRAVENOUS | Status: DC | PRN

## 2020-11-21 MED ORDER — FENTANYL CITRATE (PF) 100 MCG/2ML IJ SOLN: 25.0000 ug | INTRAMUSCULAR | Status: DC | PRN

## 2020-11-21 MED ORDER — ACETAMINOPHEN 10 MG/ML IV SOLN: 1000.0000 mg | Freq: Once | INTRAVENOUS | Status: DC | PRN

## 2020-11-21 MED ORDER — SODIUM CHLORIDE 0.9 % IR SOLN
Status: DC | PRN
  Administered 2020-11-21: 1000 mL

## 2020-11-21 MED ORDER — KETOROLAC TROMETHAMINE 30 MG/ML IJ SOLN
INTRAMUSCULAR | Status: AC
  Filled 2020-11-21: qty 1

## 2020-11-21 MED ORDER — DEXAMETHASONE SODIUM PHOSPHATE 10 MG/ML IJ SOLN
INTRAMUSCULAR | Status: DC | PRN
  Administered 2020-11-21: 10 mg via INTRAVENOUS

## 2020-11-21 MED ORDER — BUPIVACAINE LIPOSOME 1.3 % IJ SUSP
INTRAMUSCULAR | Status: DC | PRN
  Administered 2020-11-21: 13 mL

## 2020-11-21 MED ORDER — MIDAZOLAM HCL 2 MG/2ML IJ SOLN
INTRAMUSCULAR | Status: AC
  Filled 2020-11-21: qty 2

## 2020-11-21 MED ORDER — DEXMEDETOMIDINE (PRECEDEX) IN NS 20 MCG/5ML (4 MCG/ML) IV SYRINGE
PREFILLED_SYRINGE | INTRAVENOUS | Status: AC
  Filled 2020-11-21: qty 5

## 2020-11-21 SURGICAL SUPPLY — 37 items
ADH SKN CLS APL DERMABOND .7 (GAUZE/BANDAGES/DRESSINGS) ×1
APL SKNCLS STERI-STRIP NONHPOA (GAUZE/BANDAGES/DRESSINGS)
BENZOIN TINCTURE PRP APPL 2/3 (GAUZE/BANDAGES/DRESSINGS) IMPLANT
BLADE HEX COATED 2.75 (ELECTRODE) ×2 IMPLANT
BLADE SURG 15 STRL LF DISP TIS (BLADE) ×1 IMPLANT
BLADE SURG 15 STRL SS (BLADE) ×2
BLADE SURG SZ10 CARB STEEL (BLADE) ×2 IMPLANT
COVER SURGICAL LIGHT HANDLE (MISCELLANEOUS) ×2 IMPLANT
COVER WAND RF STERILE (DRAPES) IMPLANT
DECANTER SPIKE VIAL GLASS SM (MISCELLANEOUS) IMPLANT
DERMABOND ADVANCED (GAUZE/BANDAGES/DRESSINGS) ×1
DERMABOND ADVANCED .7 DNX12 (GAUZE/BANDAGES/DRESSINGS) ×1 IMPLANT
DRAPE LAPAROTOMY T 102X78X121 (DRAPES) IMPLANT
DRAPE LAPAROTOMY TRNSV 102X78 (DRAPES) IMPLANT
DRAPE SHEET LG 3/4 BI-LAMINATE (DRAPES) IMPLANT
ELECT REM PT RETURN 15FT ADLT (MISCELLANEOUS) ×2 IMPLANT
GAUZE SPONGE 4X4 12PLY STRL (GAUZE/BANDAGES/DRESSINGS) ×2 IMPLANT
GLOVE BIOGEL M 8.0 STRL (GLOVE) ×4 IMPLANT
GLOVE SURG UNDER POLY LF SZ7 (GLOVE) ×2 IMPLANT
GOWN SPEC L4 XLG W/TWL (GOWN DISPOSABLE) ×2 IMPLANT
GOWN STRL REUS W/TWL LRG LVL3 (GOWN DISPOSABLE) ×2 IMPLANT
GOWN STRL REUS W/TWL XL LVL3 (GOWN DISPOSABLE) ×6 IMPLANT
KIT BASIN OR (CUSTOM PROCEDURE TRAY) ×2 IMPLANT
KIT TURNOVER KIT A (KITS) ×2 IMPLANT
MARKER SKIN DUAL TIP RULER LAB (MISCELLANEOUS) IMPLANT
NEEDLE HYPO 25X1 1.5 SAFETY (NEEDLE) ×2 IMPLANT
NS IRRIG 1000ML POUR BTL (IV SOLUTION) ×2 IMPLANT
PACK BASIC VI WITH GOWN DISP (CUSTOM PROCEDURE TRAY) ×2 IMPLANT
PENCIL SMOKE EVACUATOR (MISCELLANEOUS) IMPLANT
SPONGE LAP 18X18 RF (DISPOSABLE) IMPLANT
SPONGE LAP 4X18 RFD (DISPOSABLE) ×2 IMPLANT
STAPLER VISISTAT 35W (STAPLE) IMPLANT
SUT VIC AB 2-0 SH 27 (SUTURE) ×2
SUT VIC AB 2-0 SH 27X BRD (SUTURE) ×1 IMPLANT
SUT VIC AB 4-0 SH 18 (SUTURE) IMPLANT
SYR BULB IRRIG 60ML STRL (SYRINGE) ×2 IMPLANT
SYR CONTROL 10ML LL (SYRINGE) ×2 IMPLANT

## 2020-11-21 NOTE — Interval H&P Note (Signed)
History and Physical Interval Note:  11/21/2020 1:15 PM  Tamara Cross  has presented today for surgery, with the diagnosis of RIGHT BACK MASS-HISTORY OF BREAST CANCER.  The various methods of treatment have been discussed with the patient and family. After consideration of risks, benefits and other options for treatment, the patient has consented to  Procedure(s) with comments: REMOVAL OF RIGHT BACK LIPOMA (Right) - ROOM 2 STARTING AT 10:30AM as a surgical intervention.  The patient's history has been reviewed, patient examined, no change in status, stable for surgery.  I have reviewed the patient's chart and labs.  Questions were answered to the patient's satisfaction.     Pedro Earls

## 2020-11-21 NOTE — Transfer of Care (Signed)
Immediate Anesthesia Transfer of Care Note  Patient: Tamara Cross  Procedure(s) Performed: REMOVAL OF RIGHT BACK LIPOMA (Right )  Patient Location: PACU  Anesthesia Type:General  Level of Consciousness: awake, alert  and oriented  Airway & Oxygen Therapy: Patient Spontanous Breathing and Patient connected to face mask oxygen  Post-op Assessment: Report given to RN and Post -op Vital signs reviewed and stable  Post vital signs: Reviewed and stable  Last Vitals:  Vitals Value Taken Time  BP 136/91 11/21/20 1415  Temp 36.7 C 11/21/20 1415  Pulse 96 11/21/20 1419  Resp 12 11/21/20 1419  SpO2 100 % 11/21/20 1419  Vitals shown include unvalidated device data.  Last Pain:  Vitals:   11/21/20 1258  TempSrc: Oral  PainSc:          Complications: No complications documented.

## 2020-11-21 NOTE — Discharge Instructions (Signed)
Lipoma Removal, Care After This sheet gives you information about how to care for yourself after your procedure. Your health care provider may also give you more specific instructions. If you have problems or questions, contact your health care provider. What can I expect after the procedure? After the procedure, it is common to have:  Mild pain.  Swelling.  Bruising. Follow these instructions at home: Bathing  Do not take baths, swim, or use a hot tub until your health care provider approves. Ask your health care provider if you may take showers. You may only be allowed to take sponge baths.  Keep your bandage (dressing) dry until your health care provider says it can be removed.   Incision care  Follow instructions from your health care provider about how to take care of your incision. Make sure you: ? Wash your hands with soap and water for at least 20 seconds before and after you change your dressing. If soap and water are not available, use hand sanitizer. ? Change your dressing as told by your health care provider. ? Leave stitches (sutures), skin glue, or adhesive strips in place. These skin closures may need to stay in place for 2 weeks or longer. If adhesive strip edges start to loosen and curl up, you may trim the loose edges. Do not remove adhesive strips completely unless your health care provider tells you to do that.  Check your incision area every day for signs of infection. Check for: ? More redness, swelling, or pain. ? Fluid or blood. ? Warmth. ? Pus or a bad smell.   Medicines  Take over-the-counter and prescription medicines only as told by your health care provider.  If you were prescribed an antibiotic medicine, use it as told by your health care provider. Do not stop using the antibiotic even if you start to feel better. General instructions  If you were given a sedative during the procedure, it can affect you for several hours. Do not drive or operate  machinery until your health care provider says that it is safe.  Do not use any products that contain nicotine or tobacco, such as cigarettes, e-cigarettes, and chewing tobacco. These can delay healing. If you need help quitting, ask your health care provider.  Return to your normal activities as told by your health care provider. Ask your health care provider what activities are safe for you.  Keep all follow-up visits as told by your health care provider. This is important.   Contact a health care provider if:  You have more redness, swelling, or pain around your incision.  You have fluid or blood coming from your incision.  Your incision feels warm to the touch.  You have pus or a bad smell coming from your incision.  You have pain that does not get better with medicine. Get help right away if:  You have chills or a fever.  You have severe pain. Summary  After the procedure, it is common to have mild pain, swelling, and bruising.  Follow instructions from your health care provider about how to take care of your incision.  Check your incision area every day for signs of infection.  Contact a health care provider if you have more redness, swelling, or pain around your incision. This information is not intended to replace advice given to you by your health care provider. Make sure you discuss any questions you have with your health care provider. Document Revised: 05/10/2019 Document Reviewed: 05/10/2019 Elsevier Patient Education  [place] may shower tomorrow.

## 2020-11-21 NOTE — Op Note (Signed)
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  After anesthesia was administered and the patient was prepped  with Betadine  and a timeout was performed.  The previously marked and palpated area was incised with a transverse incision down to the capsule.        The mass was ~5 cm in greatest diminsion.  The wound was irrigated and closed with vicryl and Monocryl and Dermabond.    The patient tolerated the procedure well and was taken to the PACU in stable condition.     Matt B. Hassell Done, [place], [facility], [place]

## 2020-11-21 NOTE — Anesthesia Preprocedure Evaluation (Addendum)
Anesthesia Evaluation  Patient identified by MRN, date of birth, ID band Patient awake    Reviewed: Allergy & Precautions, NPO status , Patient's Chart, lab work & pertinent test results  History of Anesthesia Complications (+) PONV and history of anesthetic complications  Airway Mallampati: I  TM Distance: >3 FB Neck ROM: Full    Dental  (+) Teeth Intact, Dental Advisory Given   Pulmonary asthma , Current Smoker,    breath sounds clear to auscultation       Cardiovascular + dysrhythmias + Valvular Problems/Murmurs MVP  Rhythm:Regular Rate:Normal     Neuro/Psych  Headaches, PSYCHIATRIC DISORDERS Anxiety Depression    GI/Hepatic Neg liver ROS, hiatal hernia, PUD, GERD  ,  Endo/Other  negative endocrine ROS  Renal/GU negative Renal ROS     Musculoskeletal  (+) Arthritis ,   Abdominal Normal abdominal exam  (+)   Peds  Hematology negative hematology ROS (+)   Anesthesia Other Findings   Reproductive/Obstetrics                             Anesthesia Physical Anesthesia Plan  ASA: II  Anesthesia Plan: General   Post-op Pain Management:    Induction: Intravenous  PONV Risk Score and Plan: 4 or greater and Dexamethasone, Midazolam, Scopolamine patch - Pre-op and Amisulpride  Airway Management Planned: LMA  Additional Equipment: None  Intra-op Plan:   Post-operative Plan: Extubation in OR  Informed Consent: I have reviewed the patients History and Physical, chart, labs and discussed the procedure including the risks, benefits and alternatives for the proposed anesthesia with the patient or authorized representative who has indicated his/her understanding and acceptance.     Dental advisory given  Plan Discussed with: CRNA  Anesthesia Plan Comments:        Anesthesia Quick Evaluation

## 2020-11-21 NOTE — Anesthesia Postprocedure Evaluation (Signed)
Anesthesia Post Note  Patient: Tamara Cross  Procedure(s) Performed: REMOVAL OF RIGHT BACK LIPOMA (Right )     Patient location during evaluation: PACU Anesthesia Type: General Level of consciousness: awake and alert Pain management: pain level controlled Vital Signs Assessment: post-procedure vital signs reviewed and stable Respiratory status: spontaneous breathing, nonlabored ventilation, respiratory function stable and patient connected to nasal cannula oxygen Cardiovascular status: blood pressure returned to baseline and stable Postop Assessment: no apparent nausea or vomiting Anesthetic complications: no   No complications documented.  Last Vitals:  Vitals:   11/21/20 1430 11/21/20 1445  BP: 126/81 114/70  Pulse: 86 82  Resp: 14 16  Temp:    SpO2: 100% 100%    Last Pain:  Vitals:   11/21/20 1430  TempSrc:   PainSc: 0-No pain                 Effie Berkshire

## 2020-11-21 NOTE — Anesthesia Procedure Notes (Signed)
Procedure Name: LMA Insertion Date/Time: 11/21/2020 1:24 PM Performed by: [place] Jontavius Rabalais C, CRNA Pre-anesthesia Checklist: Patient identified, Emergency Drugs available, Suction available and Patient being monitored Patient Re-evaluated:Patient Re-evaluated prior to induction Oxygen Delivery Method: Circle system utilized Preoxygenation: Pre-oxygenation with 100% oxygen Induction Type: IV induction Ventilation: Mask ventilation without difficulty LMA: LMA inserted LMA Size: 4.0 Number of attempts: 1 Airway Equipment and Method: Bite block Placement Confirmation: positive ETCO2 Tube secured with: Tape Dental Injury: Teeth and Oropharynx as per pre-operative assessment

## 2020-11-22 ENCOUNTER — Encounter (HOSPITAL_COMMUNITY): Payer: Self-pay | Admitting: Surgery

## 2020-11-22 LAB — SURGICAL PATHOLOGY

## 2020-12-18 ENCOUNTER — Telehealth: Payer: Self-pay | Admitting: Internal Medicine

## 2020-12-18 NOTE — Telephone Encounter (Signed)
lisdexamfetamine (VYVANSE) 50 MG capsule valACYclovir (VALTREX) 500 MG tablet HYDROcodone-acetaminophen (NORCO) 7.5-325 MG tablet acyclovir cream (ZOVIRAX) 5 %   [id number] - [place], [place] - [address] Phone:  [phone]  Fax:  [phone]     Patient requesting a refill, states the vyvanse was never sent in for her, and she needs a 90 day for the hydrocondone because last time we only sent 31. Patient very upset and wanting this done ASAP. I told her pcp was out of office and she is requesting if another provider can send it in  Last seen- 12.20.21 Next-03.30.22

## 2020-12-19 MED ORDER — HYDROCODONE-ACETAMINOPHEN 10-325 MG PO TABS: 0.5000 | ORAL_TABLET | Freq: Four times a day (QID) | ORAL | 0 refills | Status: DC | PRN

## 2020-12-19 MED ORDER — VALACYCLOVIR HCL 500 MG PO TABS: 500.0000 mg | ORAL_TABLET | Freq: Every day | ORAL | 11 refills | Status: DC

## 2020-12-19 NOTE — Telephone Encounter (Signed)
Valtrex and vicodin done erx  vyvanse too soon - has 90 day rx already done feb 2022

## 2020-12-19 NOTE — Telephone Encounter (Signed)
Check [place] registry last filled Vyvanse 09/26/20 anf Hydrocodone 10/325 mg 11/17/20. Pt has appt set-up for 01/03/21. Pls advise on refill in absence of PCP.Marland KitchenRubbie Battiest

## 2021-01-03 ENCOUNTER — Encounter: Payer: Self-pay | Admitting: Internal Medicine

## 2021-01-03 ENCOUNTER — Other Ambulatory Visit: Payer: Self-pay

## 2021-01-03 ENCOUNTER — Ambulatory Visit: Payer: 59 | Admitting: Internal Medicine

## 2021-01-03 DIAGNOSIS — D171 Benign lipomatous neoplasm of skin and subcutaneous tissue of trunk: Secondary | ICD-10-CM | POA: Diagnosis not present

## 2021-01-03 DIAGNOSIS — Z170 Estrogen receptor positive status [ER+]: Secondary | ICD-10-CM

## 2021-01-03 DIAGNOSIS — C50411 Malignant neoplasm of upper-outer quadrant of right female breast: Secondary | ICD-10-CM | POA: Diagnosis not present

## 2021-01-03 DIAGNOSIS — B009 Herpesviral infection, unspecified: Secondary | ICD-10-CM | POA: Diagnosis not present

## 2021-01-03 DIAGNOSIS — M5440 Lumbago with sciatica, unspecified side: Secondary | ICD-10-CM

## 2021-01-03 DIAGNOSIS — G8929 Other chronic pain: Secondary | ICD-10-CM

## 2021-01-03 DIAGNOSIS — F909 Attention-deficit hyperactivity disorder, unspecified type: Secondary | ICD-10-CM

## 2021-01-03 MED ORDER — VALACYCLOVIR HCL 500 MG PO TABS: 500.0000 mg | ORAL_TABLET | Freq: Every day | ORAL | 3 refills | Status: DC

## 2021-01-03 MED ORDER — DESVENLAFAXINE SUCCINATE ER 100 MG PO TB24: 100.0000 mg | ORAL_TABLET | Freq: Every day | ORAL | 3 refills | Status: DC

## 2021-01-03 MED ORDER — HYDROCODONE-ACETAMINOPHEN 10-325 MG PO TABS: 0.5000 | ORAL_TABLET | Freq: Three times a day (TID) | ORAL | 0 refills | Status: DC | PRN

## 2021-01-03 MED ORDER — VALACYCLOVIR HCL 500 MG PO TABS: 500.0000 mg | ORAL_TABLET | Freq: Every day | ORAL | 11 refills | Status: DC

## 2021-01-03 MED ORDER — LISDEXAMFETAMINE DIMESYLATE 50 MG PO CAPS: 50.0000 mg | ORAL_CAPSULE | ORAL | 0 refills | Status: DC

## 2021-01-03 NOTE — Assessment & Plan Note (Signed)
Wt Readings from Last 3 Encounters:  01/03/21 207 lb (93.9 kg)  11/21/20 204 lb 3.2 oz (92.6 kg)  11/20/20 200 lb (90.7 kg)

## 2021-01-03 NOTE — Assessment & Plan Note (Signed)
Removed

## 2021-01-03 NOTE — Progress Notes (Signed)
Subjective:  Patient ID: Tamara Cross, female    DOB: 1966/09/18  Age: 55 y.o. MRN: [id number]  CC: Follow-up (Pt is here for 3 month follow up. No other concerns)   HPI JAVON HUPFER presents for LBP, asthma, depression f/u  Outpatient Medications Prior to Visit  Medication Sig Dispense Refill  . acetaminophen (TYLENOL) 500 MG tablet Take 1,000 mg by mouth every 6 (six) hours as needed for headache.    . albuterol (VENTOLIN HFA) 108 (90 Base) MCG/ACT inhaler Inhale 1-2 puffs into the lungs every 6 (six) hours as needed for wheezing or shortness of breath. 3 Inhaler 3  . budesonide-formoterol (SYMBICORT) 160-4.5 MCG/ACT inhaler Inhale 2 puffs into the lungs 2 (two) times daily. (Patient taking differently: Inhale 2 puffs into the lungs 3 (three) times a week.) 3 Inhaler 3  . Camphor-Menthol-Methyl Sal (SALONPAS) 3.10-12-08 % PTCH Apply 1 patch topically daily as needed (on Back).    . cetirizine (ZYRTEC) 10 MG tablet Take 10 mg by mouth daily as needed for allergies.    . Cholecalciferol (VITAMIN D3) 50 MCG (2000 UT) capsule Take 1 capsule (2,000 Units total) by mouth daily. 100 capsule 3  . desvenlafaxine (PRISTIQ) 100 MG 24 hr tablet Take 1 tablet (100 mg total) by mouth daily. 90 tablet 3  . gabapentin (NEURONTIN) 300 MG capsule Take 300 mg by mouth daily as needed (Back pain).    [name] HYDROcodone-acetaminophen (NORCO) 10-325 MG tablet Take 0.5 tablets by mouth every 6 (six) hours as needed for moderate pain. 30 tablet 0  . ibuprofen (ADVIL) 200 MG tablet Take 400 mg by mouth every 6 (six) hours as needed (Back pain).    [name] lisdexamfetamine (VYVANSE) 50 MG capsule Take 1 capsule (50 mg total) by mouth every morning. It is a 3 month supply 90 capsule 0  . metoprolol succinate (TOPROL-XL) 50 MG 24 hr tablet Take 1 tablet (50 mg total) by mouth daily. Take with or immediately following a meal. 30 tablet 11  . Soft Lens Products (SENSITIVE EYES SALINE) SOLN Place 1 drop into both eyes daily.  Saline eye lub    . valACYclovir (VALTREX) 500 MG tablet Take 1 tablet (500 mg total) by mouth daily. Increase to bid prn with outbreak 60 tablet 11   Facility-Administered Medications Prior to Visit  Medication Dose Route Frequency Provider Last Rate Last Admin  . gadopentetate dimeglumine (MAGNEVIST) injection 18 mL  18 mL Intravenous Once PRN Penumalli, Vikram R, MD        ROS: Review of Systems  Constitutional: Positive for unexpected weight change. Negative for activity change, appetite change, chills and fatigue.  HENT: Negative for congestion, mouth sores and sinus pressure.   Eyes: Negative for visual disturbance.  Respiratory: Negative for cough and chest tightness.   Gastrointestinal: Negative for abdominal pain and nausea.  Genitourinary: Negative for difficulty urinating, frequency and vaginal pain.  Musculoskeletal: Positive for back pain. Negative for gait problem.  Skin: Negative for pallor and rash.  Neurological: Negative for dizziness, tremors, weakness, numbness and headaches.  Psychiatric/Behavioral: Negative for confusion and sleep disturbance.    Objective:  BP 134/82 (BP Location: Left Arm, Patient Position: Sitting, Cuff Size: Normal)   Pulse 100   Temp 98.6 F (37 C) (Oral)   Ht 5\' 4"  (1.626 m)   Wt 207 lb (93.9 kg)   LMP 04/11/2012 Comment: ablation  SpO2 95%   BMI 35.53 kg/m   BP Readings from Last 3 Encounters:  01/03/21  134/82  11/21/20 112/68  09/25/20 122/70    Wt Readings from Last 3 Encounters:  01/03/21 207 lb (93.9 kg)  11/21/20 204 lb 3.2 oz (92.6 kg)  11/20/20 200 lb (90.7 kg)    Physical Exam Constitutional:      General: She is not in acute distress.    Appearance: She is well-developed. She is obese.  HENT:     Head: Normocephalic.     Right Ear: External ear normal.     Left Ear: External ear normal.     Nose: Nose normal.  Eyes:     General:        Right eye: No discharge.        Left eye: No discharge.      Conjunctiva/sclera: Conjunctivae normal.     Pupils: Pupils are equal, round, and reactive to light.  Neck:     Thyroid: No thyromegaly.     Vascular: No JVD.     Trachea: No tracheal deviation.  Cardiovascular:     Rate and Rhythm: Normal rate and regular rhythm.     Heart sounds: Normal heart sounds.  Pulmonary:     Effort: No respiratory distress.     Breath sounds: No stridor. No wheezing.  Abdominal:     General: Bowel sounds are normal. There is no distension.     Palpations: Abdomen is soft. There is no mass.     Tenderness: There is no abdominal tenderness. There is no guarding or rebound.  Musculoskeletal:        General: No tenderness.     Cervical back: Normal range of motion and neck supple.  Lymphadenopathy:     Cervical: No cervical adenopathy.  Skin:    Findings: No erythema or rash.  Neurological:     Mental Status: She is oriented to person, place, and time.     Cranial Nerves: No cranial nerve deficit.     Motor: No abnormal muscle tone.     Coordination: Coordination normal.     Deep Tendon Reflexes: Reflexes normal.  Psychiatric:        Behavior: Behavior normal.        Thought Content: Thought content normal.        Judgment: Judgment normal.   Lumbar spine painful range of motion  Lab Results  Component Value Date   WBC 6.2 11/21/2020   HGB 15.1 (H) 11/21/2020   HCT 45.7 11/21/2020   PLT 258 11/21/2020   GLUCOSE 118 (H) 11/21/2020   CHOL 178 08/23/2008   TRIG 47 08/23/2008   HDL 52.7 08/23/2008   LDLCALC 116 (H) 08/23/2008   ALT 36 02/09/2018   AST 24 02/09/2018   NA 143 11/21/2020   K 3.3 (L) 11/21/2020   CL 106 11/21/2020   CREATININE 0.62 11/21/2020   BUN 18 11/21/2020   CO2 27 11/21/2020   TSH 5.12 (H) 10/21/2017    No results found.  Assessment & Plan:    Walker Kehr, MD

## 2021-01-03 NOTE — Assessment & Plan Note (Signed)
Norco 10/325 po qid prn w/caution  Potential benefits of a long term opioids use as well as potential risks (i.e. addiction risk, apnea etc) and complications (i.e. Somnolence, constipation and others) were explained to the patient and were aknowledged. Gabapentin prn

## 2021-01-03 NOTE — Assessment & Plan Note (Signed)
Valterx prn

## 2021-01-07 NOTE — Assessment & Plan Note (Signed)
On Vyvanse.

## 2021-01-07 NOTE — Assessment & Plan Note (Addendum)
The patient was on tamoxifen

## 2021-02-12 NOTE — Telephone Encounter (Signed)
Follow up message  Patient calling for status of refill 

## 2021-02-14 ENCOUNTER — Other Ambulatory Visit: Payer: Self-pay | Admitting: Internal Medicine

## 2021-02-14 MED ORDER — LISDEXAMFETAMINE DIMESYLATE 50 MG PO CAPS: 50.0000 mg | ORAL_CAPSULE | ORAL | 0 refills | Status: DC

## 2021-02-14 MED ORDER — HYDROCODONE-ACETAMINOPHEN 10-325 MG PO TABS: 0.5000 | ORAL_TABLET | Freq: Three times a day (TID) | ORAL | 0 refills | Status: DC | PRN

## 2021-03-02 ENCOUNTER — Encounter: Payer: Self-pay | Admitting: Internal Medicine

## 2021-03-02 ENCOUNTER — Telehealth (INDEPENDENT_AMBULATORY_CARE_PROVIDER_SITE_OTHER): Payer: 59 | Admitting: Internal Medicine

## 2021-03-02 DIAGNOSIS — M5430 Sciatica, unspecified side: Secondary | ICD-10-CM | POA: Diagnosis not present

## 2021-03-02 DIAGNOSIS — F909 Attention-deficit hyperactivity disorder, unspecified type: Secondary | ICD-10-CM

## 2021-03-02 MED ORDER — HYDROCODONE-ACETAMINOPHEN 10-325 MG PO TABS
0.5000 | ORAL_TABLET | Freq: Three times a day (TID) | ORAL | 0 refills | Status: DC | PRN
Start: 2021-03-02 — End: 2021-04-22

## 2021-03-02 MED ORDER — LISDEXAMFETAMINE DIMESYLATE 50 MG PO CAPS: 50.0000 mg | ORAL_CAPSULE | ORAL | 0 refills | Status: DC

## 2021-03-02 NOTE — Progress Notes (Signed)
Virtual Visit via Video Note  I connected with Tamara Cross on 03/02/21 at 10:00 AM EDT by a video enabled telemedicine application and verified that I am speaking with the correct person using two identifiers.   I discussed the limitations of evaluation and management by telemedicine and the availability of in person appointments. The patient expressed understanding and agreed to proceed.  I was located at our office. The patient was in her car There was no one else present in the visit.   History of Present Illness: We need to follow-up on ADD and sciatica     Observations/Objective: The patient appears to be in no acute distress, looks well.  Assessment and Plan:  See my Assessment and Plan. Follow Up Instructions:    I discussed the assessment and treatment plan with the patient. The patient was provided an opportunity to ask questions and all were answered. The patient agreed with the plan and demonstrated an understanding of the instructions.   The patient was advised to call back or seek an in-person evaluation if the symptoms worsen or if the condition fails to improve as anticipated.  I provided face-to-face time during this encounter. We were at different locations.   Walker Kehr, MD

## 2021-03-05 NOTE — Assessment & Plan Note (Signed)
Continue with Vyvance 50 mg q am  Potential benefits of a long term stimulant use as well as potential risks  and complications were explained to the patient and were aknowledged.

## 2021-03-05 NOTE — Assessment & Plan Note (Signed)
Ongoing symptoms of pain.  Continue with Norco prn  Potential benefits of a long term opioids use as well as potential risks (i.e. addiction risk, apnea etc) and complications (i.e. Somnolence, constipation and others) were explained to the patient and were aknowledged.

## 2021-04-17 ENCOUNTER — Other Ambulatory Visit: Payer: Self-pay

## 2021-04-18 ENCOUNTER — Other Ambulatory Visit: Payer: Self-pay

## 2021-04-22 ENCOUNTER — Other Ambulatory Visit: Payer: Self-pay | Admitting: Internal Medicine

## 2021-04-22 MED ORDER — HYDROCODONE-ACETAMINOPHEN 10-325 MG PO TABS
0.5000 | ORAL_TABLET | Freq: Three times a day (TID) | ORAL | 0 refills | Status: DC | PRN
Start: 2021-04-22 — End: 2021-05-22

## 2021-04-22 MED ORDER — LISDEXAMFETAMINE DIMESYLATE 50 MG PO CAPS: 50.0000 mg | ORAL_CAPSULE | ORAL | 0 refills | Status: DC

## 2021-05-22 ENCOUNTER — Other Ambulatory Visit: Payer: Self-pay | Admitting: Internal Medicine

## 2021-05-22 MED ORDER — LISDEXAMFETAMINE DIMESYLATE 50 MG PO CAPS: 50.0000 mg | ORAL_CAPSULE | ORAL | 0 refills | Status: DC

## 2021-05-22 MED ORDER — HYDROCODONE-ACETAMINOPHEN 10-325 MG PO TABS
0.5000 | ORAL_TABLET | Freq: Three times a day (TID) | ORAL | 0 refills | Status: DC | PRN
Start: 2021-05-22 — End: 2021-06-23

## 2021-05-22 MED ORDER — DESVENLAFAXINE SUCCINATE ER 100 MG PO TB24: 100.0000 mg | ORAL_TABLET | Freq: Every day | ORAL | 1 refills | Status: DC

## 2021-05-25 ENCOUNTER — Telehealth (INDEPENDENT_AMBULATORY_CARE_PROVIDER_SITE_OTHER): Payer: 59 | Admitting: Internal Medicine

## 2021-05-25 ENCOUNTER — Other Ambulatory Visit: Payer: Self-pay

## 2021-05-25 DIAGNOSIS — G8929 Other chronic pain: Secondary | ICD-10-CM | POA: Diagnosis not present

## 2021-05-25 DIAGNOSIS — F909 Attention-deficit hyperactivity disorder, unspecified type: Secondary | ICD-10-CM

## 2021-05-25 DIAGNOSIS — F329 Major depressive disorder, single episode, unspecified: Secondary | ICD-10-CM

## 2021-05-25 DIAGNOSIS — M5440 Lumbago with sciatica, unspecified side: Secondary | ICD-10-CM | POA: Diagnosis not present

## 2021-05-25 DIAGNOSIS — F32A Depression, unspecified: Secondary | ICD-10-CM

## 2021-05-27 ENCOUNTER — Encounter: Payer: Self-pay | Admitting: Internal Medicine

## 2021-05-27 NOTE — Assessment & Plan Note (Signed)
Continue with Vyvance 50 mg q am  Potential benefits of a long term stimulant use as well as potential risks  and complications were explained to the patient and were aknowledged.

## 2021-05-27 NOTE — Assessment & Plan Note (Signed)
Chronic symptoms.  Continue with Norco as needed.  Potential benefits of a long term opioids use as well as potential risks (i.e. addiction risk, apnea etc) and complications (i.e. Somnolence, constipation and others) were explained to the patient and were aknowledged.

## 2021-05-27 NOTE — Assessment & Plan Note (Signed)
Chronic symptoms.   Continue with Pristiq 100 mg daily. 

## 2021-05-27 NOTE — Progress Notes (Signed)
Virtual Visit via Video Note  I connected with [name] on 05/27/21 at 12:20 PM EDT by a video enabled telemedicine application and verified that I am speaking with the correct person using two identifiers.   I discussed the limitations of evaluation and management by telemedicine and the availability of in person appointments. The patient expressed understanding and agreed to proceed.  I was located at our [facility] office. The patient was at home. There was no one else present in the visit.   History of Present Illness: We need to follow-up on ADD, LBP, depression.     Observations/Objective: The patient appears to be in no acute distress, looks well.  Assessment and Plan:  See my Assessment and Plan. Follow Up Instructions:    I discussed the assessment and treatment plan with the patient. The patient was provided an opportunity to ask questions and all were answered. The patient agreed with the plan and demonstrated an understanding of the instructions.   The patient was advised to call back or seek an in-person evaluation if the symptoms worsen or if the condition fails to improve as anticipated.  I provided face-to-face time during this encounter. We were at different locations.   Walker Kehr, MD

## 2021-06-23 ENCOUNTER — Other Ambulatory Visit: Payer: Self-pay

## 2021-06-26 MED ORDER — LISDEXAMFETAMINE DIMESYLATE 50 MG PO CAPS: 50.0000 mg | ORAL_CAPSULE | ORAL | 0 refills | Status: DC

## 2021-06-26 MED ORDER — HYDROCODONE-ACETAMINOPHEN 10-325 MG PO TABS: 0.5000 | ORAL_TABLET | Freq: Three times a day (TID) | ORAL | 0 refills | Status: DC | PRN

## 2021-07-20 ENCOUNTER — Ambulatory Visit (INDEPENDENT_AMBULATORY_CARE_PROVIDER_SITE_OTHER): Payer: 59 | Admitting: Nurse Practitioner

## 2021-07-20 VITALS — Temp 97.8°F

## 2021-07-20 DIAGNOSIS — R0602 Shortness of breath: Secondary | ICD-10-CM

## 2021-07-20 DIAGNOSIS — R051 Acute cough: Secondary | ICD-10-CM | POA: Insufficient documentation

## 2021-07-20 DIAGNOSIS — J069 Acute upper respiratory infection, unspecified: Secondary | ICD-10-CM

## 2021-07-20 MED ORDER — BENZONATATE 200 MG PO CAPS: 200.0000 mg | ORAL_CAPSULE | Freq: Three times a day (TID) | ORAL | 0 refills | Status: AC | PRN

## 2021-07-20 MED ORDER — PREDNISONE 20 MG PO TABS
ORAL_TABLET | ORAL | 0 refills | Status: AC
Start: 2021-07-20 — End: 2021-07-26

## 2021-07-20 MED ORDER — LEVOCETIRIZINE DIHYDROCHLORIDE 5 MG PO TABS: 5.0000 mg | ORAL_TABLET | Freq: Every evening | ORAL | 0 refills | Status: DC

## 2021-07-20 NOTE — Assessment & Plan Note (Signed)
We will send in some Tessalon Perles cough patient already prescribed some hydrocodone that she uses as needed.  Continue to monitor

## 2021-07-20 NOTE — Assessment & Plan Note (Signed)
Patient has a few days worth of symptoms.  Does have history of asthma.  Using her Symbicort as prescribed and albuterol inhaler more frequently than normal.  Did discuss that recommend patient get COVID tested.  We will start steroid taper along with antihistamine and cough medication.  Patient does not start improving by Monday we discussed to reach out to me know we can start her on antibiotic.  Patient looked well today

## 2021-07-20 NOTE — Progress Notes (Signed)
Patient ID: Tamara Cross, female    DOB: 07-04-1966, 55 y.o.   MRN: [id number]  Virtual visit completed through [employer], a video enabled telemedicine application. Due to national recommendations of social distancing due to COVID-19, a virtual visit is felt to be most appropriate for this patient at this time. Reviewed limitations, risks, security and privacy concerns of performing a virtual visit and the availability of in person appointments. I also reviewed that there may be a patient responsible charge related to this service. The patient agreed to proceed.   Patient location: home Provider location: [place] [facility], office Persons participating in this virtual visit: patient, provider   If any vitals were documented, they were collected by patient at home unless specified below.    Temp 97.8 F (36.6 C) Comment: per patient  LMP 04/11/2012 Comment: ablation   CC: Cough and Shortness of breath Subjective:   HPI: DEVLYN Cross is a 55 y.o. female presenting on 07/20/2021 for Cough (Started around 10/11 or 10/12- sx started with scratchy throat, nasal congestion, sinus pressure, now has a cough-with yellow productivity, chest tightness/sob, post nasal drip.)   Symptoms started on 10/11 10/12 Not tested for covid  No vaccines States that her son had some allergies this week. Recovered for the cold  Been using nyquill and mucinex and dyquill. Some relief    Relevant past medical, surgical, family and social history reviewed and updated as indicated. Interim medical history since our last visit reviewed. Allergies and medications reviewed and updated. Outpatient Medications Prior to Visit  Medication Sig Dispense Refill   acetaminophen (TYLENOL) 500 MG tablet Take 1,000 mg by mouth every 6 (six) hours as needed for headache.     albuterol (VENTOLIN HFA) 108 (90 Base) MCG/ACT inhaler Inhale 1-2 puffs into the lungs every 6 (six) hours as needed for wheezing or  shortness of breath. 3 Inhaler 3   budesonide-formoterol (SYMBICORT) 160-4.5 MCG/ACT inhaler Inhale 2 puffs into the lungs 2 (two) times daily. (Patient taking differently: Inhale 2 puffs into the lungs 3 (three) times a week.) 3 Inhaler 3   cetirizine (ZYRTEC) 10 MG tablet Take 10 mg by mouth daily as needed for allergies.     Cholecalciferol (VITAMIN D3) 50 MCG (2000 UT) capsule Take 1 capsule (2,000 Units total) by mouth daily. 100 capsule 3   desvenlafaxine (PRISTIQ) 100 MG 24 hr tablet Take 1 tablet (100 mg total) by mouth daily. 90 tablet 1   gabapentin (NEURONTIN) 300 MG capsule Take 300 mg by mouth daily as needed (Back pain).     HYDROcodone-acetaminophen (NORCO) 10-325 MG tablet Take 0.5-1 tablets by mouth 3 (three) times daily as needed for severe pain. 90 tablet 0   ibuprofen (ADVIL) 200 MG tablet Take 400 mg by mouth every 6 (six) hours as needed (Back pain).     lisdexamfetamine (VYVANSE) 50 MG capsule Take 1 capsule (50 mg total) by mouth every morning. 30 capsule 0   metoprolol succinate (TOPROL-XL) 50 MG 24 hr tablet Take 1 tablet (50 mg total) by mouth daily. Take with or immediately following a meal. 30 tablet 11   Soft Lens Products (SENSITIVE EYES SALINE) SOLN Place 1 drop into both eyes daily. Saline eye lub     valACYclovir (VALTREX) 500 MG tablet Take 1 tablet (500 mg total) by mouth daily. Increase to bid prn with outbreak 180 tablet 3   Camphor-Menthol-Methyl Sal (SALONPAS) 3.10-12-08 % PTCH Apply 1 patch topically daily as needed (on  Back).     Facility-Administered Medications Prior to Visit  Medication Dose Route Frequency Provider Last Rate Last Admin   gadopentetate dimeglumine (MAGNEVIST) injection 18 mL  18 mL Intravenous Once PRN Penumalli, Vikram R, MD         Per HPI unless specifically indicated in ROS section below Review of Systems  Constitutional:  Negative for chills, fatigue and fever.  HENT:  Positive for congestion, postnasal drip and sore throat  (scratchy for 1/2 a day then resovled).   Respiratory:  Positive for cough (yellowish) and shortness of breath.   Cardiovascular:  Negative for chest pain.  Gastrointestinal:  Negative for diarrhea, nausea and vomiting.  Musculoskeletal:  Negative for arthralgias.  Neurological:  Positive for headaches.  Objective:  Temp 97.8 F (36.6 C) Comment: per patient  LMP 04/11/2012 Comment: ablation  Wt Readings from Last 3 Encounters:  01/03/21 207 lb (93.9 kg)  11/21/20 204 lb 3.2 oz (92.6 kg)  11/20/20 200 lb (90.7 kg)       Physical exam: Gen: alert, NAD, not ill appearing Pulm: speaks in complete sentences without increased work of breathing Psych: normal mood, normal thought content      Results for orders placed or performed during the hospital encounter of 11/21/20  SARS CORONAVIRUS 2 (TAT 6-24 HRS) Nasopharyngeal Nasopharyngeal Swab   Specimen: Nasopharyngeal Swab  Result Value Ref Range   SARS Coronavirus 2 NEGATIVE NEGATIVE  Basic metabolic panel  Result Value Ref Range   Sodium 143 135 - 145 mmol/L   Potassium 3.3 (L) 3.5 - 5.1 mmol/L   Chloride 106 98 - 111 mmol/L   CO2 27 22 - 32 mmol/L   Glucose, Bld 118 (H) 70 - 99 mg/dL   BUN 18 6 - 20 mg/dL   Creatinine, Ser 0.62 0.44 - 1.00 mg/dL   Calcium 8.9 8.9 - 10.3 mg/dL   GFR, Estimated >60 >60 mL/min   Anion gap 10 5 - 15  CBC  Result Value Ref Range   WBC 6.2 4.0 - 10.5 K/uL   RBC 5.06 3.87 - 5.11 MIL/uL   Hemoglobin 15.1 (H) 12.0 - 15.0 g/dL   HCT 45.7 36.0 - 46.0 %   MCV 90.3 80.0 - 100.0 fL   MCH 29.8 26.0 - 34.0 pg   MCHC 33.0 30.0 - 36.0 g/dL   RDW 13.1 11.5 - 15.5 %   Platelets 258 150 - 400 K/uL   nRBC 0.0 0.0 - 0.2 %  Surgical pathology  Result Value Ref Range   SURGICAL PATHOLOGY      SURGICAL PATHOLOGY CASE: [id number] PATIENT: Tamara Cross Surgical Pathology Report     Clinical History: Right back mass, history of breast cancer (crm)   FINAL MICROSCOPIC DIAGNOSIS:  A. SOFT TISSUE,  LIPOMA, RIGHT BACK, EXCISION: -  Mature adipose tissue consistent with lipoma   GROSS DESCRIPTION:  The specimen is received fresh and consists of a 6.1 x 4.0 x 3.1 cm piece of tan-yellow adipose tissue.  Sectioning reveals a yellow-red, grossly unremarkable cut surface.  Representative sections are submitted in 1 cassette.  Craig Staggers 11/21/2020)    Final Diagnosis performed by Thressa Sheller, MD.   Electronically signed 11/22/2020 Technical component performed at [facility] [address] [place], [place] [address].  Professional component performed at [employer]. [facility], [place] [address], [place], [place] [address].  Immunohistochemistry Technical component (if applicable) was performed at [facility]. [place], [address], [place], [place]  [place] (if applicable): Some of these immunohistochemical stains may have been developed and the performance characteristics determine by [facility]. Some may not have been cleared or approved by the U.S. Food and Drug Administration. The FDA has determined that such clearance or approval is not necessary. This test is used for clinical purposes. It should not be regarded as investigational or for research. This laboratory is certified under the [place] (CLIA-88) as qualified to perform high complexity clinical laboratory testing.  The controls stained appropriately.    Assessment & Plan:   Problem List Items Addressed This Visit       Respiratory   Viral URI with cough - Primary    Patient has a few days worth of symptoms.  Does have history of asthma.  Using her Symbicort as prescribed and albuterol inhaler more frequently than normal.  Did discuss that recommend patient get COVID tested.  We will start steroid taper along with antihistamine and cough medication.  Patient does not start improving by  Monday we discussed to reach out to me know we can start her on antibiotic.  Patient looked well today      Relevant Medications   levocetirizine (XYZAL) 5 MG tablet     Other   Acute cough    We will send in some Tessalon Perles cough patient already prescribed some hydrocodone that she uses as needed.  Continue to monitor      Relevant Medications   benzonatate (TESSALON) 200 MG capsule   Shortness of breath    Been using her albuterol inhaler more lately.  Does have a history of asthma.  On maintenance inhaler.  We will do a short 6-day course of steroids to help patient out with breathing.  Did review signs and symptoms of when she needs to be seen in urgent or emergent care setting.  Patient knowledge did recommend she tested for COVID      Relevant Medications   predniSONE (DELTASONE) 20 MG tablet     No orders of the defined types were placed in this encounter.  No orders of the defined types were placed in this encounter.   I discussed the assessment and treatment plan with the patient. The patient was provided an opportunity to ask questions and all were answered. The patient agreed with the plan and demonstrated an understanding of the instructions. The patient was advised to call back or seek an in-person evaluation if the symptoms worsen or if the condition fails to improve as anticipated.  Follow up plan: No follow-ups on file.  Romilda Garret, NP

## 2021-07-20 NOTE — Assessment & Plan Note (Signed)
Been using her albuterol inhaler more lately.  Does have a history of asthma.  On maintenance inhaler.  We will do a short 6-day course of steroids to help patient out with breathing.  Did review signs and symptoms of when she needs to be seen in urgent or emergent care setting.  Patient knowledge did recommend she tested for COVID

## 2021-07-23 ENCOUNTER — Other Ambulatory Visit: Payer: Self-pay | Admitting: Internal Medicine

## 2021-07-23 MED ORDER — LISDEXAMFETAMINE DIMESYLATE 50 MG PO CAPS: 50.0000 mg | ORAL_CAPSULE | ORAL | 0 refills | Status: DC

## 2021-07-23 MED ORDER — AZITHROMYCIN 250 MG PO TABS: ORAL_TABLET | ORAL | 0 refills | Status: DC

## 2021-07-23 MED ORDER — DESVENLAFAXINE SUCCINATE ER 100 MG PO TB24: 100.0000 mg | ORAL_TABLET | Freq: Every day | ORAL | 1 refills | Status: DC

## 2021-07-23 MED ORDER — HYDROCODONE-ACETAMINOPHEN 10-325 MG PO TABS: 0.5000 | ORAL_TABLET | Freq: Three times a day (TID) | ORAL | 0 refills | Status: DC | PRN

## 2021-08-09 ENCOUNTER — Other Ambulatory Visit: Payer: Self-pay | Admitting: Internal Medicine

## 2021-08-09 MED ORDER — AZITHROMYCIN 250 MG PO TABS: ORAL_TABLET | ORAL | 0 refills | Status: DC

## 2021-08-09 NOTE — Progress Notes (Signed)
URI.

## 2021-08-15 ENCOUNTER — Encounter: Payer: Self-pay | Admitting: Internal Medicine

## 2021-08-15 ENCOUNTER — Telehealth (INDEPENDENT_AMBULATORY_CARE_PROVIDER_SITE_OTHER): Payer: 59 | Admitting: Internal Medicine

## 2021-08-15 DIAGNOSIS — F909 Attention-deficit hyperactivity disorder, unspecified type: Secondary | ICD-10-CM

## 2021-08-15 DIAGNOSIS — G8929 Other chronic pain: Secondary | ICD-10-CM

## 2021-08-15 DIAGNOSIS — M5440 Lumbago with sciatica, unspecified side: Secondary | ICD-10-CM

## 2021-08-15 DIAGNOSIS — F32A Depression, unspecified: Secondary | ICD-10-CM

## 2021-08-15 MED ORDER — VYVANSE 50 MG PO CAPS
50.0000 mg | ORAL_CAPSULE | ORAL | 0 refills | Status: DC
Start: 2021-08-15 — End: 2021-11-14

## 2021-08-15 MED ORDER — HYDROCODONE-ACETAMINOPHEN 10-325 MG PO TABS: 0.5000 | ORAL_TABLET | Freq: Three times a day (TID) | ORAL | 0 refills | Status: DC | PRN

## 2021-08-15 MED ORDER — VYVANSE 50 MG PO CAPS: 50.0000 mg | ORAL_CAPSULE | ORAL | 0 refills | Status: DC

## 2021-08-15 MED ORDER — DESVENLAFAXINE SUCCINATE ER 100 MG PO TB24: 100.0000 mg | ORAL_TABLET | Freq: Every day | ORAL | 1 refills | Status: DC

## 2021-08-15 NOTE — Assessment & Plan Note (Signed)
Cont on Vyvance 50 mg q am  Potential benefits of a long term stimulant use as well as potential risks  and complications were explained to the patient and were aknowledged.

## 2021-08-15 NOTE — Assessment & Plan Note (Signed)
Pt lost wt on diet - 190 lbs now

## 2021-08-15 NOTE — Assessment & Plan Note (Signed)
Continue with Pristiq 100 mg daily.

## 2021-08-15 NOTE — Progress Notes (Signed)
Virtual Visit via Video Note  I connected with Tamara Cross on 08/15/21 at  1:40 PM EST by a video enabled telemedicine application and verified that I am speaking with the correct person using two identifiers.   I discussed the limitations of evaluation and management by telemedicine and the availability of in person appointments. The patient expressed understanding and agreed to proceed.  I was located at our [facility] office. The patient was at home. There was no one else present in the visit.  No chief complaint on file.    History of Present Illness:  F/u on ADD, depression, LBP  Review of Systems  Constitutional:  Negative for fever.  HENT:  Negative for congestion.   Respiratory:  Negative for shortness of breath.   Cardiovascular:  Negative for leg swelling.  Musculoskeletal:  Positive for back pain.  Psychiatric/Behavioral:  The patient is not nervous/anxious and does not have insomnia.     Observations/Objective: The patient appears to be in no acute distress  Assessment and Plan:  Problem List Items Addressed This Visit     ADHD (attention deficit hyperactivity disorder)    Cont on Vyvance 50 mg q am  Potential benefits of a long term stimulant use as well as potential risks  and complications were explained to the patient and were aknowledged.      Chronic depressive disorder      Continue with Pristiq 100 mg daily.      Relevant Medications   desvenlafaxine (PRISTIQ) 100 MG 24 hr tablet   Low back pain    Cont on Norco 10/325 po qid prn w/caution  Potential benefits of a long term opioids use as well as potential risks (i.e. addiction risk, apnea etc) and complications (i.e. Somnolence, constipation and others) were explained to the patient and were aknowledged. Gabapentin prn      Relevant Medications   HYDROcodone-acetaminophen (NORCO) 10-325 MG tablet     Meds ordered this encounter  Medications   HYDROcodone-acetaminophen (NORCO) 10-325  MG tablet    Sig: Take 0.5-1 tablets by mouth 3 (three) times daily as needed for severe pain.    Dispense:  90 tablet    Refill:  0   desvenlafaxine (PRISTIQ) 100 MG 24 hr tablet    Sig: Take 1 tablet (100 mg total) by mouth daily.    Dispense:  90 tablet    Refill:  1   VYVANSE 50 MG capsule    Sig: Take 1 capsule (50 mg total) by mouth every morning.    Dispense:  30 capsule    Refill:  0    Please fill on or after 08/15/21   VYVANSE 50 MG capsule    Sig: Take 1 capsule (50 mg total) by mouth every morning.    Dispense:  30 capsule    Refill:  0    Please fill on or after 09/14/2021   VYVANSE 50 MG capsule    Sig: Take 1 capsule (50 mg total) by mouth every morning.    Dispense:  30 capsule    Refill:  0    Please fill on or after 10/14/21     Follow Up Instructions:    I discussed the assessment and treatment plan with the patient. The patient was provided an opportunity to ask questions and all were answered. The patient agreed with the plan and demonstrated an understanding of the instructions.   The patient was advised to call back or seek an in-person evaluation  if the symptoms worsen or if the condition fails to improve as anticipated.  I provided face-to-face time during this encounter. We were at different locations.   Walker Kehr, MD

## 2021-08-15 NOTE — Assessment & Plan Note (Signed)
Cont on Norco 10/325 po qid prn w/caution  Potential benefits of a long term opioids use as well as potential risks (i.e. addiction risk, apnea etc) and complications (i.e. Somnolence, constipation and others) were explained to the patient and were aknowledged. Gabapentin prn

## 2021-09-19 ENCOUNTER — Emergency Department (HOSPITAL_COMMUNITY)
Admission: EM | Admit: 2021-09-19 | Discharge: 2021-09-20 | Disposition: A | Discharge status: Home / Self Care | Payer: 59 | Attending: Physician Assistant | Admitting: Physician Assistant

## 2021-09-19 ENCOUNTER — Other Ambulatory Visit: Payer: Self-pay

## 2021-09-19 ENCOUNTER — Encounter (HOSPITAL_COMMUNITY): Payer: Self-pay | Admitting: Emergency Medicine

## 2021-09-19 ENCOUNTER — Emergency Department (HOSPITAL_COMMUNITY): Payer: 59

## 2021-09-19 DIAGNOSIS — R110 Nausea: Secondary | ICD-10-CM | POA: Insufficient documentation

## 2021-09-19 DIAGNOSIS — M545 Low back pain, unspecified: Secondary | ICD-10-CM | POA: Insufficient documentation

## 2021-09-19 DIAGNOSIS — I700 Atherosclerosis of aorta: Secondary | ICD-10-CM | POA: Diagnosis not present

## 2021-09-19 DIAGNOSIS — Z5321 Procedure and treatment not carried out due to patient leaving prior to being seen by health care provider: Secondary | ICD-10-CM | POA: Insufficient documentation

## 2021-09-19 LAB — CBC WITH DIFFERENTIAL/PLATELET
Abs Immature Granulocytes: 0.02 10*3/uL (ref 0.00–0.07)
Basophils Absolute: 0 10*3/uL (ref 0.0–0.1)
Basophils Relative: 1 %
Eosinophils Absolute: 0.1 10*3/uL (ref 0.0–0.5)
Eosinophils Relative: 1 %
HCT: 49.1 % — ABNORMAL HIGH (ref 36.0–46.0)
Hemoglobin: 16 g/dL — ABNORMAL HIGH (ref 12.0–15.0)
Immature Granulocytes: 0 %
Lymphocytes Relative: 17 %
Lymphs Abs: 1.4 10*3/uL (ref 0.7–4.0)
MCH: 30 pg (ref 26.0–34.0)
MCHC: 32.6 g/dL (ref 30.0–36.0)
MCV: 91.9 fL (ref 80.0–100.0)
Monocytes Absolute: 0.5 10*3/uL (ref 0.1–1.0)
Monocytes Relative: 6 %
Neutro Abs: 6.1 10*3/uL (ref 1.7–7.7)
Neutrophils Relative %: 75 %
Platelets: 308 10*3/uL (ref 150–400)
RBC: 5.34 MIL/uL — ABNORMAL HIGH (ref 3.87–5.11)
RDW: 12.8 % (ref 11.5–15.5)
WBC: 8.1 10*3/uL (ref 4.0–10.5)
nRBC: 0 % (ref 0.0–0.2)

## 2021-09-19 LAB — COMPREHENSIVE METABOLIC PANEL
ALT: 22 U/L (ref 0–44)
AST: 22 U/L (ref 15–41)
Albumin: 4 g/dL (ref 3.5–5.0)
Alkaline Phosphatase: 82 U/L (ref 38–126)
Anion gap: 9 (ref 5–15)
BUN: 7 mg/dL (ref 6–20)
CO2: 28 mmol/L (ref 22–32)
Calcium: 10.1 mg/dL (ref 8.9–10.3)
Chloride: 102 mmol/L (ref 98–111)
Creatinine, Ser: 0.65 mg/dL (ref 0.44–1.00)
GFR, Estimated: >60 mL/min (ref >60)
Glucose, Bld: 113 mg/dL — ABNORMAL HIGH (ref 70–99)
Potassium: 4.4 mmol/L (ref 3.5–5.1)
Sodium: 139 mmol/L (ref 135–145)
Total Bilirubin: 0.6 mg/dL (ref 0.3–1.2)
Total Protein: 6.8 g/dL (ref 6.5–8.1)

## 2021-09-19 LAB — URINALYSIS, ROUTINE W REFLEX MICROSCOPIC
Bilirubin Urine: NEGATIVE
Glucose, UA: NEGATIVE mg/dL
Hgb urine dipstick: NEGATIVE
Ketones, ur: NEGATIVE mg/dL
Leukocytes,Ua: NEGATIVE
Nitrite: NEGATIVE
Protein, ur: NEGATIVE mg/dL
Specific Gravity, Urine: 1.025 (ref 1.005–1.030)
pH: 6 (ref 5.0–8.0)

## 2021-09-19 LAB — TROPONIN I (HIGH SENSITIVITY)
Troponin I (High Sensitivity): 4 ng/L (ref <18)
Troponin I (High Sensitivity): 4 ng/L (ref <18)

## 2021-09-19 LAB — LIPASE, BLOOD: Lipase: 29 U/L (ref 11–51)

## 2021-09-19 MED ORDER — PROMETHAZINE HCL 25 MG PO TABS
25.0000 mg | ORAL_TABLET | Freq: Once | ORAL | Status: AC
  Administered 2021-09-19: 20:00:00 25 mg via ORAL
  Filled 2021-09-19: qty 1

## 2021-09-19 NOTE — ED Provider Notes (Signed)
Emergency Medicine Provider Triage Evaluation Note  Tamara Cross , a 55 y.o. female  was evaluated in triage.  Pt complains of 2 days of right-sided lower back pain and nausea for 2 days.  The pain is worsened.  She is urinating more frequently.  She denies diarrhea.  She states that she has tried ibuprofen and Phenergan at home.  She denies having any Phenergan today.  She states that she does get prescriptions for narcotic medicine she has not taken any of them as she gets that for her back pain and she feels like this is a separate issue.  No fevers reported.  No numbness or tingling.   Review of Systems  Positive: See above  Negative:   Physical Exam  BP 132/83 (BP Location: Left Arm)    Pulse 81    Temp 98.9 F (37.2 C) (Oral)    Resp 18    LMP 04/11/2012 Comment: ablation   SpO2 96%  Gen:   Awake, no distress   Resp:  Normal effort  MSK:   Moves extremities without difficulty  Other:  Lungs clear to auscultation bilaterally.  There is right-sided lateral lumbar back tenderness to palpation with slight CVA tenderness to percussion.  Abdomen is soft nontender nondistended.  Medical Decision Making  Medically screening exam initiated at 7:40 PM.  Appropriate orders placed.  [name] was informed that the remainder of the evaluation will be completed by another provider, this initial triage assessment does not replace that evaluation, and the importance of remaining in the ED until their evaluation is complete.  Will check labs, Anticipate ordering CT scan depending on results of UA.    Note: Portions of this report may have been transcribed using voice recognition software. Every effort was made to ensure accuracy; however, inadvertent computerized transcription errors may be present    Lorin Glass, PA-C 09/19/21 1944    Charlesetta Shanks, MD 09/22/21 [phone]

## 2021-09-19 NOTE — ED Triage Notes (Signed)
Pt with right lower back pain and nausea for 2 days.  Has gotten worse.  Urinating more frequently.  No diarrhea.  Pt states she has taken ibuprofen and phenergan at home with only mild relief.

## 2021-09-20 ENCOUNTER — Encounter: Payer: Self-pay | Admitting: Internal Medicine

## 2021-09-20 NOTE — ED Notes (Signed)
PT refusing vital signs at this time

## 2021-09-20 NOTE — ED Notes (Signed)
Pt upset about the wait time. Pt asked this NT "what am I waiting on?", it was explained to the patient that she was waiting to be seen by a provider and that we were waiting for a room to become available. Pt started walking towards doors leading to the back and stated "take me to a room now". This NT explained that was not possible as there was not a room available at the moment in which the pt replied "I am going to see the doctor right now I am going through those doors." It was once again explained that pt cannot just go to the back as there was not a room available at this time. Pt returned back to chair and made comments towards staff stating her frustration with the care she has received while in the ED. Provided pt with patient experience number and names of lobby staff per request. Triage RN and PA notified of same, reinforced hospital policy r/t moving patients based on acuity & results. Pt seen by staff leaving lobby w husband.

## 2021-09-24 ENCOUNTER — Other Ambulatory Visit: Payer: Self-pay

## 2021-09-24 ENCOUNTER — Emergency Department (HOSPITAL_BASED_OUTPATIENT_CLINIC_OR_DEPARTMENT_OTHER): Payer: 59

## 2021-09-24 ENCOUNTER — Encounter (HOSPITAL_BASED_OUTPATIENT_CLINIC_OR_DEPARTMENT_OTHER): Payer: Self-pay

## 2021-09-24 ENCOUNTER — Emergency Department (HOSPITAL_BASED_OUTPATIENT_CLINIC_OR_DEPARTMENT_OTHER)
Admission: EM | Admit: 2021-09-24 | Discharge: 2021-09-24 | Disposition: A | Discharge status: Home / Self Care | Payer: 59 | Attending: Emergency Medicine | Admitting: Emergency Medicine

## 2021-09-24 DIAGNOSIS — R350 Frequency of micturition: Secondary | ICD-10-CM | POA: Diagnosis not present

## 2021-09-24 DIAGNOSIS — J45909 Unspecified asthma, uncomplicated: Secondary | ICD-10-CM | POA: Diagnosis not present

## 2021-09-24 DIAGNOSIS — Z853 Personal history of malignant neoplasm of breast: Secondary | ICD-10-CM | POA: Insufficient documentation

## 2021-09-24 DIAGNOSIS — R1011 Right upper quadrant pain: Secondary | ICD-10-CM | POA: Diagnosis present

## 2021-09-24 DIAGNOSIS — F17210 Nicotine dependence, cigarettes, uncomplicated: Secondary | ICD-10-CM | POA: Diagnosis not present

## 2021-09-24 DIAGNOSIS — R109 Unspecified abdominal pain: Secondary | ICD-10-CM

## 2021-09-24 DIAGNOSIS — Z85820 Personal history of malignant melanoma of skin: Secondary | ICD-10-CM | POA: Insufficient documentation

## 2021-09-24 DIAGNOSIS — R0789 Other chest pain: Secondary | ICD-10-CM | POA: Diagnosis not present

## 2021-09-24 DIAGNOSIS — Z7951 Long term (current) use of inhaled steroids: Secondary | ICD-10-CM | POA: Insufficient documentation

## 2021-09-24 LAB — COMPREHENSIVE METABOLIC PANEL
ALT: 17 U/L (ref 0–44)
AST: 16 U/L (ref 15–41)
Albumin: 3.9 g/dL (ref 3.5–5.0)
Alkaline Phosphatase: 73 U/L (ref 38–126)
Anion gap: 10 (ref 5–15)
BUN: 14 mg/dL (ref 6–20)
CO2: 28 mmol/L (ref 22–32)
Calcium: 9 mg/dL (ref 8.9–10.3)
Chloride: 101 mmol/L (ref 98–111)
Creatinine, Ser: 0.52 mg/dL (ref 0.44–1.00)
GFR, Estimated: >60 mL/min (ref >60)
Glucose, Bld: 121 mg/dL — ABNORMAL HIGH (ref 70–99)
Potassium: 3.9 mmol/L (ref 3.5–5.1)
Sodium: 139 mmol/L (ref 135–145)
Total Bilirubin: 0.8 mg/dL (ref 0.3–1.2)
Total Protein: 7 g/dL (ref 6.5–8.1)

## 2021-09-24 LAB — CBC WITH DIFFERENTIAL/PLATELET
Abs Immature Granulocytes: 0.02 10*3/uL (ref 0.00–0.07)
Basophils Absolute: 0.1 10*3/uL (ref 0.0–0.1)
Basophils Relative: 1 %
Eosinophils Absolute: 0.1 10*3/uL (ref 0.0–0.5)
Eosinophils Relative: 1 %
HCT: 44.3 % (ref 36.0–46.0)
Hemoglobin: 15 g/dL (ref 12.0–15.0)
Immature Granulocytes: 0 %
Lymphocytes Relative: 18 %
Lymphs Abs: 1.3 10*3/uL (ref 0.7–4.0)
MCH: 30.3 pg (ref 26.0–34.0)
MCHC: 33.9 g/dL (ref 30.0–36.0)
MCV: 89.5 fL (ref 80.0–100.0)
Monocytes Absolute: 0.5 10*3/uL (ref 0.1–1.0)
Monocytes Relative: 6 %
Neutro Abs: 5.5 10*3/uL (ref 1.7–7.7)
Neutrophils Relative %: 74 %
Platelets: 265 10*3/uL (ref 150–400)
RBC: 4.95 MIL/uL (ref 3.87–5.11)
RDW: 12.4 % (ref 11.5–15.5)
WBC: 7.4 10*3/uL (ref 4.0–10.5)
nRBC: 0 % (ref 0.0–0.2)

## 2021-09-24 LAB — LIPASE, BLOOD: Lipase: 31 U/L (ref 11–51)

## 2021-09-24 LAB — URINALYSIS, ROUTINE W REFLEX MICROSCOPIC
Bilirubin Urine: NEGATIVE
Glucose, UA: NEGATIVE mg/dL
Hgb urine dipstick: NEGATIVE
Ketones, ur: NEGATIVE mg/dL
Nitrite: NEGATIVE
Protein, ur: NEGATIVE mg/dL
Specific Gravity, Urine: 1.02 (ref 1.005–1.030)
pH: 7 (ref 5.0–8.0)

## 2021-09-24 LAB — URINALYSIS, MICROSCOPIC (REFLEX)

## 2021-09-24 LAB — TROPONIN I (HIGH SENSITIVITY)
Troponin I (High Sensitivity): 3 ng/L (ref <18)
Troponin I (High Sensitivity): 3 ng/L (ref <18)

## 2021-09-24 LAB — D-DIMER, QUANTITATIVE: D-Dimer, Quant: <0.27 ug/mL-FEU (ref 0.00–0.50)

## 2021-09-24 MED ORDER — SODIUM CHLORIDE 0.9 % IV SOLN
25.0000 mg | Freq: Once | INTRAVENOUS | Status: DC
  Filled 2021-09-24: qty 1

## 2021-09-24 MED ORDER — IOHEXOL 350 MG/ML SOLN
100.0000 mL | Freq: Once | INTRAVENOUS | Status: AC | PRN
  Administered 2021-09-24: 10:00:00 100 mL via INTRAVENOUS

## 2021-09-24 MED ORDER — FENTANYL CITRATE PF 50 MCG/ML IJ SOSY
50.0000 ug | PREFILLED_SYRINGE | Freq: Once | INTRAMUSCULAR | Status: AC
  Administered 2021-09-24: 10:00:00 50 ug via INTRAVENOUS
  Filled 2021-09-24: qty 1

## 2021-09-24 MED ORDER — OXYCODONE-ACETAMINOPHEN 5-325 MG PO TABS: 1.0000 | ORAL_TABLET | Freq: Four times a day (QID) | ORAL | 0 refills | Status: DC | PRN

## 2021-09-24 MED ORDER — PANTOPRAZOLE SODIUM 20 MG PO TBEC: 20.0000 mg | DELAYED_RELEASE_TABLET | Freq: Every day | ORAL | 1 refills | Status: DC

## 2021-09-24 MED ORDER — PROMETHAZINE HCL 25 MG/ML IJ SOLN
INTRAMUSCULAR | Status: AC
  Administered 2021-09-24: 10:00:00 25 mg
  Filled 2021-09-24: qty 1

## 2021-09-24 NOTE — Discharge Instructions (Signed)
You are seen in the emergency department for right flank pain and some upper abdominal pain.  You had lab work urinalysis EKG chest x-ray and a CAT scan of your chest abdomen and pelvis that did not show an obvious explanation for your pain.  Please contact your primary care doctor for close follow-up.  We are prescribing you some pain medication and acid medication.  Return to the emergency department if any worsening or concerning symptoms

## 2021-09-24 NOTE — ED Triage Notes (Addendum)
Pt arrives with c/o pain to right flank X1 week, reports it is a stabbing pain, has no gallbladder or appendix. Reports some nausea with the pain. Some urinary frequency, no burning or blood in the urine. Last BM yesterday morning. History of back surgery, has been taking ibuprofen, reports taking old hydrocodone at home with no relief.

## 2021-09-24 NOTE — ED Notes (Signed)
ED Provider at bedside. 

## 2021-09-24 NOTE — ED Provider Notes (Signed)
[facility] EMERGENCY DEPARTMENT Provider Note   CSN: [id number] Arrival date & time: 09/24/21  0756     History Chief Complaint  Patient presents with   Flank Pain    Tamara Cross is a 55 y.o. female.  She is here with a complaint of right flank pain sharp stabbing its been going on for a week.  Worse with bending twisting walking.  Pain sometimes takes her breath away but does not feel short of breath.  Had some urinary frequency but no dysuria or hematuria.  Pain seems to get worse with eating and will generalize across to her upper abdomen and right upper quadrant.  History of cholecystectomy appendectomy.  She was seen at [facility] few days ago had fairly normal labs urinalysis and CT renal.  Left prior to being seen by provider.  Taking ibuprofen and some leftover hydrocodone without relief.  Did say she was helping her brother with moving but does not recall any real trauma.  The history is provided by the patient.  Flank Pain This is a new problem. The current episode started more than 1 week ago. The problem occurs constantly. The problem has not changed since onset.Associated symptoms include abdominal pain. Pertinent negatives include no chest pain, no headaches and no shortness of breath. The symptoms are aggravated by bending, twisting and eating. Nothing relieves the symptoms. She has tried rest for the symptoms. The treatment provided no relief.      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 -   Complication of anesthesia    last 2 surgeries had some tachycardia   Depression    Dysrhythmia    tachy occ   Esophageal stricture    GERD (gastroesophageal reflux disease)    H/O hiatal hernia    Heart murmur 1990s   MVP   IBS (irritable bowel syndrome)    Migraine    migrains  are rare   MVP (mitral valve prolapse)    Osteoarthritis    Peptic ulcer disease    PONV (postoperative nausea and vomiting)    S/P radiation therapy  06/28/2014-08/08/2014    1) Right Breast / 50 Gy in 25 fractions/ 2) Right Breast Boost / 10 Gy in 5 fractions   Skin cancer    basal cell     Patient Active Problem List   Diagnosis Date Noted   Acute cough 07/20/2021   Shortness of breath 07/20/2021   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   Lipoma 09/25/2020   ADHD (attention deficit hyperactivity disorder) 07/12/2019   Back spasm 02/22/2019   Knee contusion 02/04/2018   HNP (herniated nucleus pulposus), lumbar 11/14/2017   Sciatic leg pain 10/21/2017   Sinusitis, chronic 10/15/2016   Asthmatic bronchitis 04/03/2016   Viral URI with cough 12/20/2015   Ataxia 05/30/2015   Liver lesion, left lobe 01/03/2015   Gallstone pancreatitis 11/28/2014   Dysphagia, pharyngoesophageal phase 10/06/2014   Special screening for malignant neoplasm of colon 10/06/2014   Dysphagia    Special screening for malignant neoplasms, colon    Arthralgia 09/12/2014   Submuscular lipoma of chest 09/12/2014   Leg pain 08/29/2014   Hot flashes due to tamoxifen 08/29/2014   Low back pain 04/26/2014   Rash  and nonspecific skin eruption 04/26/2014   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   Palpitations 11/18/2013   Allergy    Heart murmur    HIP PAIN 09/07/2010   PEDAL EDEMA 04/26/2010   EXTERNAL HEMORRHOIDS 01/24/2010   HIATAL HERNIA 01/24/2010   GERD 12/19/2009   Irritable bowel syndrome 12/19/2009   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   Migraine 08/23/2008   ESOPHAGEAL STRICTURE 03/11/2008   POLYARTHRALGIA 03/11/2008   Obesity 05/01/2007   ANXIETY 05/01/2007   Chronic depressive disorder 05/01/2007   PREMATURE VENTRICULAR CONTRACTIONS 05/01/2007   Allergic rhinitis 05/01/2007   MITRAL VALVE PROLAPSE, HX OF 05/01/2007   PUD, HX OF 05/01/2007    Past Surgical History:   Procedure Laterality Date   APPENDECTOMY     BREAST LUMPECTOMY Bilateral    BREAST LUMPECTOMY WITH NEEDLE LOCALIZATION Bilateral 05/02/2014   Procedure: BILATERIAL BREAST LUMPECTOMY WITH NEEDLE LOCALIZATION;  Surgeon: Shann Medal, MD;  Location: [facility];  Service: General;  Laterality: Bilateral;   CERVIX LESION DESTRUCTION     CESAREAN SECTION  2002   CHOLECYSTECTOMY  11/29/2014   lap chole    CHOLECYSTECTOMY N/A 11/29/2014   Procedure: LAPAROSCOPIC CHOLECYSTECTOMY WITH INTRAOPERATIVE CHOLANGIOGRAM;  Surgeon: Doreen Salvage, MD;  Location: [facility];  Service: General;  Laterality: N/A;   COLONOSCOPY N/A 10/06/2014   Procedure: COLONOSCOPY;  Surgeon: Lafayette Dragon, MD;  Location: [facility];  Service: Endoscopy;  Laterality: N/A;   COLPOSCOPY     CYSTECTOMY     left breast,in 20's   DILATION AND CURETTAGE OF UTERUS     x2   ESOPHAGOGASTRODUODENOSCOPY N/A 10/06/2014   Procedure: ESOPHAGOGASTRODUODENOSCOPY (EGD);  Surgeon: Lafayette Dragon, MD;  Location: [facility];  Service: Endoscopy;  Laterality: N/A;   HAND SURGERY     cancer removed basal cell   LIPOMA EXCISION Right 11/21/2020   Procedure: REMOVAL OF RIGHT BACK LIPOMA;  Surgeon: Johnathan Hausen, MD;  Location: [facility];  Service: General;  Laterality: Right;  ROOM 2 STARTING AT 10:30AM   LUMBAR LAMINECTOMY/DECOMPRESSION MICRODISCECTOMY Left 11/14/2017   Procedure: DISCECTOMY LUMBAR Five - SACRAL - one, LEFT;  Surgeon: Ashok Pall, MD;  Location: [facility];  Service: Neurosurgery;  Laterality: Left;  MICRODISCECTOMY LUMBAR 5- SACRAL 1, LEFT   LUMBAR LAMINECTOMY/DECOMPRESSION MICRODISCECTOMY Left 01/30/2018   Procedure: MICRODISCECTOMY LUMBAR 4- LUMBAR 5 LEFT;  Surgeon: Ashok Pall, MD;  Location: [facility];  Service: Neurosurgery;  Laterality: Left;  MICRODISCECTOMY LUMBAR 4- LUMBAR 5 LEFT   NOVASURE ABLATION     SHOULDER SURGERY Left    debridement ,bone spurs   TONSILLECTOMY       OB History     Gravida  1   Para  1   Term       Preterm      AB      Living         SAB      IAB      Ectopic      Multiple      Live Births           Obstetric Comments  She is not on hormones.  She had her last menstrual period around 2013.         Family History  Problem Relation Age of Onset   Heart disease Father    Hypertension Father    Colon cancer Maternal Grandmother    Depression Brother    Diabetes Maternal Grandfather    Heart disease Maternal  Grandfather    Colon cancer Maternal Uncle        x 2   Colon polyps Other        x 9 maternal uncles/aunts   Breast cancer Neg Hx     Social History   Tobacco Use   Smoking status: Some Days    Packs/day: 0.50    Years: 10.00    Pack years: 5.00    Types: Cigarettes    Start date: 02/23/1998   Smokeless tobacco: Never  Vaping Use   Vaping Use: Never used  Substance Use Topics   Alcohol use: Yes    Alcohol/week: 5.0 standard drinks    Types: 5 Glasses of wine per week    Comment: socially   Drug use: No    Comment: quit for 15 yrs smoked for 15 started again 1 yr    Home Medications Prior to Admission medications   Medication Sig Start Date End Date Taking? Authorizing Provider  acetaminophen (TYLENOL) 500 MG tablet Take 1,000 mg by mouth every 6 (six) hours as needed for headache.    [provider]  albuterol (VENTOLIN HFA) 108 (90 Base) MCG/ACT inhaler Inhale 1-2 puffs into the lungs every 6 (six) hours as needed for wheezing or shortness of breath. 02/22/19   Plotnikov, Evie Lacks, MD  budesonide-formoterol (SYMBICORT) 160-4.5 MCG/ACT inhaler Inhale 2 puffs into the lungs 2 (two) times daily. Patient taking differently: Inhale 2 puffs into the lungs 3 (three) times a week. 02/22/19   Plotnikov, Evie Lacks, MD  cetirizine (ZYRTEC) 10 MG tablet Take 10 mg by mouth daily as needed for allergies.    [provider]  Cholecalciferol (VITAMIN D3) 50 MCG (2000 UT) capsule Take 1 capsule (2,000 Units total) by mouth daily. 09/25/20    Plotnikov, Evie Lacks, MD  desvenlafaxine (PRISTIQ) 100 MG 24 hr tablet Take 1 tablet (100 mg total) by mouth daily. 08/15/21   Plotnikov, Evie Lacks, MD  gabapentin (NEURONTIN) 300 MG capsule Take 300 mg by mouth daily as needed (Back pain).    [provider]  HYDROcodone-acetaminophen (NORCO) 10-325 MG tablet Take 0.5-1 tablets by mouth 3 (three) times daily as needed for severe pain. 08/15/21   Plotnikov, Evie Lacks, MD  ibuprofen (ADVIL) 200 MG tablet Take 400 mg by mouth every 6 (six) hours as needed (Back pain).    [provider]  levocetirizine (XYZAL) 5 MG tablet Take 1 tablet (5 mg total) by mouth every evening for 15 days. 07/20/21 08/04/21  Michela Pitcher, NP  metoprolol succinate (TOPROL-XL) 50 MG 24 hr tablet Take 1 tablet (50 mg total) by mouth daily. Take with or immediately following a meal. 02/10/19   Plotnikov, Evie Lacks, MD  Soft Lens Products (SENSITIVE EYES SALINE) SOLN Place 1 drop into both eyes daily. Saline eye lub    [provider]  valACYclovir (VALTREX) 500 MG tablet Take 1 tablet (500 mg total) by mouth daily. Increase to bid prn with outbreak 01/03/21   Plotnikov, Evie Lacks, MD  VYVANSE 50 MG capsule Take 1 capsule (50 mg total) by mouth every morning. 08/15/21   Plotnikov, Evie Lacks, MD  VYVANSE 50 MG capsule Take 1 capsule (50 mg total) by mouth every morning. 08/15/21   Plotnikov, Evie Lacks, MD  VYVANSE 50 MG capsule Take 1 capsule (50 mg total) by mouth every morning. 08/15/21   Plotnikov, Evie Lacks, MD    Allergies    Imitrex [sumatriptan], Sulfonamide derivatives, Reglan [metoclopramide], Zofran Alvis Lemmings  hcl], and Hibiclens [chlorhexidine]  Review of Systems   Review of Systems  Constitutional:  Negative for fever.  HENT:  Negative for sore throat.   Eyes:  Negative for visual disturbance.  Respiratory:  Negative for shortness of breath.   Cardiovascular:  Negative for chest pain.  Gastrointestinal:  Positive for abdominal pain and  nausea. Negative for constipation, diarrhea and vomiting.  Genitourinary:  Positive for flank pain. Negative for dysuria.  Musculoskeletal:  Positive for back pain.  Skin:  Negative for rash.  Neurological:  Negative for weakness, numbness and headaches.   Physical Exam Updated Vital Signs BP (!) 148/88 (BP Location: Right Arm)    Pulse 66    Temp 98.2 F (36.8 C) (Oral)    Resp 16    Ht 5\' 4"  (1.626 m)    Wt 83.9 kg    LMP 04/11/2012 Comment: ablation   SpO2 98%    BMI 31.76 kg/m   Physical Exam Vitals and nursing note reviewed.  Constitutional:      General: She is not in acute distress.    Appearance: Normal appearance. She is well-developed.  HENT:     Head: Normocephalic and atraumatic.  Eyes:     Conjunctiva/sclera: Conjunctivae normal.  Cardiovascular:     Rate and Rhythm: Normal rate and regular rhythm.     Heart sounds: No murmur heard. Pulmonary:     Effort: Pulmonary effort is normal. No respiratory distress.     Breath sounds: Normal breath sounds.  Abdominal:     Palpations: Abdomen is soft.     Tenderness: There is no abdominal tenderness. There is no guarding or rebound.  Musculoskeletal:        General: No swelling or tenderness.     Cervical back: Neck supple.  Skin:    General: Skin is warm and dry.     Capillary Refill: Capillary refill takes less than 2 seconds.  Neurological:     General: No focal deficit present.     Mental Status: She is alert.     Sensory: No sensory deficit.     Motor: No weakness.     Gait: Gait normal.  Psychiatric:        Mood and Affect: Mood normal.    ED Results / Procedures / Treatments   Labs (all labs ordered are listed, but only abnormal results are displayed) Labs Reviewed  COMPREHENSIVE METABOLIC PANEL - Abnormal; Notable for the following components:      Result Value   Glucose, Bld 121 (*)    All other components within normal limits  URINALYSIS, ROUTINE W REFLEX MICROSCOPIC - Abnormal; Notable for the  following components:   Leukocytes,Ua TRACE (*)    All other components within normal limits  URINALYSIS, MICROSCOPIC (REFLEX) - Abnormal; Notable for the following components:   Bacteria, UA RARE (*)    Non Squamous Epithelial PRESENT (*)    All other components within normal limits  LIPASE, BLOOD  CBC WITH DIFFERENTIAL/PLATELET  D-DIMER, QUANTITATIVE  TROPONIN I (HIGH SENSITIVITY)  TROPONIN I (HIGH SENSITIVITY)    EKG EKG Interpretation  Date/Time:  Monday September 24 2021 08:30:36 EST Ventricular Rate:  74 PR Interval:  169 QRS Duration: 89 QT Interval:  406 QTC Calculation: 451 R Axis:   41 Text Interpretation: Sinus rhythm Low voltage, precordial leads No significant change since prior 2/19 Confirmed by Aletta Edouard [phone]) on 09/24/2021 8:32:27 AM  Radiology DG Chest Port 1 View  Result Date: 09/24/2021 CLINICAL DATA:  Chest pain EXAM: PORTABLE CHEST 1 VIEW COMPARISON:  Chest x-ray 10/09/2016 FINDINGS: Heart size and mediastinum appear stable and within normal limits. Calcified plaques in the aortic arch. Mild bibasilar opacities most likely represent subsegmental atelectasis. No significant pleural effusion visualized. No pneumothorax. IMPRESSION: Mild bibasilar likely subsegmental atelectasis. Electronically Signed   By: Ofilia Neas M.D.   On: 09/24/2021 08:50   CT Angio Chest/Abd/Pel for Dissection W and/or W/WO  Result Date: 09/24/2021 CLINICAL DATA:  Acute aortic syndrome (AAS) suspected. Right flank pain EXAM: CT ANGIOGRAPHY CHEST, ABDOMEN AND PELVIS TECHNIQUE: Non-contrast CT of the chest was initially obtained. Multidetector CT imaging through the chest, abdomen and pelvis was performed using the standard protocol during bolus administration of intravenous contrast. Multiplanar reconstructed images and MIPs were obtained and reviewed to evaluate the vascular anatomy. CONTRAST:  178mL OMNIPAQUE IOHEXOL 350 MG/ML SOLN COMPARISON:  CT renal stone study  09/19/2021. FINDINGS: CTA CHEST FINDINGS Cardiovascular: Moderate coronary artery and scattered aortic calcifications. Heart is normal size. Aorta is normal caliber. No dissection. Mediastinum/Nodes: No mediastinal, hilar, or axillary adenopathy. Trachea and esophagus are unremarkable. Thyroid unremarkable. Lungs/Pleura: Linear scarring or subsegmental atelectasis in the lung bases. Lungs otherwise clear. No effusions. Musculoskeletal: Chest wall soft tissues are unremarkable. Surgical clips in the right breast. No acute bony abnormality. Review of the MIP images confirms the above findings. CTA ABDOMEN AND PELVIS FINDINGS VASCULAR Aorta: Scattered aortic calcifications.  No aneurysm or dissection. Celiac: Patent without evidence of aneurysm, dissection, vasculitis or significant stenosis. SMA: Patent without evidence of aneurysm, dissection, vasculitis or significant stenosis. Renals: Both renal arteries are patent without evidence of aneurysm, dissection, vasculitis, fibromuscular dysplasia or significant stenosis. IMA: Patent without evidence of aneurysm, dissection, vasculitis or significant stenosis. Inflow: Scattered calcifications.  No aneurysm or dissection. Veins: No obvious venous abnormality within the limitations of this arterial phase study. Review of the MIP images confirms the above findings. NON-VASCULAR Hepatobiliary: Prior cholecystectomy. Small subcentimeter hypodensities scattered in the liver, likely small cysts, stable since prior study. Pancreas: No focal abnormality or ductal dilatation. Spleen: No focal abnormality.  Normal size. Adrenals/Urinary Tract: No adrenal abnormality. No focal renal abnormality. No stones or hydronephrosis. Urinary bladder is unremarkable. Stomach/Bowel: Stomach, large and small bowel grossly unremarkable. Lymphatic: No adenopathy Reproductive: Uterus and adnexa unremarkable.  No mass. Other: No free fluid or free air. Musculoskeletal: No acute bony abnormality.  Review of the MIP images confirms the above findings. IMPRESSION: No evidence of aortic aneurysm or dissection. Scattered distal aortic calcifications. No acute cardiopulmonary disease. No acute findings in the abdomen or pelvis. Electronically Signed   By: Rolm Baptise M.D.   On: 09/24/2021 10:12    Procedures Procedures   Medications Ordered in ED Medications  fentaNYL (SUBLIMAZE) injection 50 mcg (50 mcg Intravenous Given 09/24/21 1010)  iohexol (OMNIPAQUE) 350 MG/ML injection 100 mL (100 mLs Intravenous Contrast Given 09/24/21 0958)  promethazine (PHENERGAN) 25 MG/ML injection (25 mg  Given 09/24/21 2202)    ED Course  I have reviewed the triage vital signs and the nursing notes.  Pertinent labs & imaging results that were available during my care of the patient were reviewed by me and considered in my medical decision making (see chart for details).  Clinical Course as of 09/24/21 1931  Mon Sep 24, 2021  0842 Chest x-ray interpreted by me as no acute findings.  Awaiting radiology reading. [MB]  [id number] Chest x-ray interpreted by me as no clear infiltrates. [MB]  1030 Reviewed results of work-up  with her.  No clear etiology found for her symptoms.  We will trial her on some pain medicine and PPI.  Recommended close follow-up with PCP. [MB]    Clinical Course User Index [MB] Hayden Rasmussen, MD   MDM Rules/Calculators/A&P                         This patient complains of right flank pain upper abdominal pain; this involves an extensive number of treatment Options and is a complaint that carries with it a high risk of complications and Morbidity. The differential includes retained stone, renal colic, peptic ulcer disease, musculoskeletal, gastritis, pneumonia, PE  I ordered, reviewed and interpreted labs, which included CBC with normal white count normal hemoglobin, chemistries fairly normal other than mildly elevated glucose, LFTs normal, D-dimer negative, urinalysis without clear  signs of infection, troponin flat I ordered medication IV pain medication and nausea medication I ordered imaging studies which included CT angio chest abdomen pelvis and I independently    visualized and interpreted imaging which showed no acute findings Additional history obtained from patient's husband Previous records obtained and reviewed in epic including prior ED visit at [facility] After the interventions stated above, I reevaluated the patient and found patient to be hemodynamically stable.  Reviewed results of work-up with her.  We will trial her on some pain medicine and PPI.  Recommended close follow-up with PCP.  Return instructions discussed     Final Clinical Impression(s) / ED Diagnoses Final diagnoses:  Right flank pain    Rx / DC Orders ED Discharge Orders          Ordered    oxyCODONE-acetaminophen (PERCOCET/ROXICET) 5-325 MG tablet  Every 6 hours PRN        09/24/21 1033    pantoprazole (PROTONIX) 20 MG tablet  Daily        09/24/21 1033             Hayden Rasmussen, MD 09/24/21 1934

## 2021-09-26 ENCOUNTER — Encounter: Payer: Self-pay | Admitting: Internal Medicine

## 2021-09-26 ENCOUNTER — Ambulatory Visit (INDEPENDENT_AMBULATORY_CARE_PROVIDER_SITE_OTHER): Payer: 59 | Admitting: Internal Medicine

## 2021-09-26 ENCOUNTER — Other Ambulatory Visit: Payer: Self-pay

## 2021-09-26 VITALS — BP 112/72 | HR 80 | Temp 98.6°F | Ht 64.0 in | Wt 186.4 lb

## 2021-09-26 DIAGNOSIS — R112 Nausea with vomiting, unspecified: Secondary | ICD-10-CM

## 2021-09-26 DIAGNOSIS — R1011 Right upper quadrant pain: Secondary | ICD-10-CM

## 2021-09-26 DIAGNOSIS — K219 Gastro-esophageal reflux disease without esophagitis: Secondary | ICD-10-CM | POA: Diagnosis not present

## 2021-09-26 MED ORDER — PROMETHAZINE HCL 25 MG PO TABS: 25.0000 mg | ORAL_TABLET | Freq: Three times a day (TID) | ORAL | 0 refills | Status: DC | PRN

## 2021-09-26 MED ORDER — PANTOPRAZOLE SODIUM 40 MG PO TBEC: 40.0000 mg | DELAYED_RELEASE_TABLET | Freq: Two times a day (BID) | ORAL | 5 refills | Status: AC

## 2021-09-26 MED ORDER — HYDROCODONE-ACETAMINOPHEN 10-325 MG PO TABS
0.5000 | ORAL_TABLET | Freq: Three times a day (TID) | ORAL | 0 refills | Status: DC | PRN
Start: 2021-09-26 — End: 2021-11-14

## 2021-09-26 MED ORDER — AMOXICILLIN-POT CLAVULANATE 875-125 MG PO TABS: 1.0000 | ORAL_TABLET | Freq: Two times a day (BID) | ORAL | 0 refills | Status: DC

## 2021-09-26 NOTE — Progress Notes (Signed)
Subjective:  Patient ID: Tamara Cross, female    DOB: Apr 28, 1966  Age: 55 y.o. MRN: [id number]  CC: Follow-up (ER follow-up- Pt states she went to [place] on the 19th (R) side pain. Still have pain w. Nausea & Dizziness)   HPI Tamara Cross presents for R LBP x 10, went to ER x2  C/o nausea, RUQ. Protonix 20 mg helped some...  Per 09/24/21 ER note: "This patient complains of right flank pain upper abdominal pain; this involves an extensive number of treatment Options and is a complaint that carries with it a high risk of complications and Morbidity. The differential includes retained stone, renal colic, peptic ulcer disease, musculoskeletal, gastritis, pneumonia, PE   I ordered, reviewed and interpreted labs, which included CBC with normal white count normal hemoglobin, chemistries fairly normal other than mildly elevated glucose, LFTs normal, D-dimer negative, urinalysis without clear signs of infection, troponin flat I ordered medication IV pain medication and nausea medication I ordered imaging studies which included CT angio chest abdomen pelvis and I independently    visualized and interpreted imaging which showed no acute findings Additional history obtained from patient's husband Previous records obtained and reviewed in epic including prior ED visit at [facility] After the interventions stated above, I reevaluated the patient and found patient to be hemodynamically stable.  Reviewed results of work-up with her.  We will trial her on some pain medicine and PPI.  Recommended close follow-up with PCP.  Return instructions discussed"  Still in pain - lingering pain w/stabbing pains  Chest CT angio: NON-VASCULAR   Hepatobiliary: Prior cholecystectomy. Small subcentimeter hypodensities scattered in the liver, likely small cysts, stable since prior study.   Pancreas: No focal abnormality or ductal dilatation.   Spleen: No focal abnormality.  Normal size.    Adrenals/Urinary Tract: No adrenal abnormality. No focal renal abnormality. No stones or hydronephrosis. Urinary bladder is unremarkable.   Stomach/Bowel: Stomach, large and small bowel grossly unremarkable.   Lymphatic: No adenopathy   Reproductive: Uterus and adnexa unremarkable.  No mass.   Other: No free fluid or free air.   Musculoskeletal: No acute bony abnormality.   Review of the MIP images confirms the above findings.   IMPRESSION: No evidence of aortic aneurysm or dissection. Scattered distal aortic calcifications.   No acute cardiopulmonary disease.   No acute findings in the abdomen or pelvis.     Electronically Signed   By: Rolm Baptise M.D.   On: 09/24/2021 10:12    Outpatient Medications Prior to Visit  Medication Sig Dispense Refill   acetaminophen (TYLENOL) 500 MG tablet Take 1,000 mg by mouth every 6 (six) hours as needed for headache.     albuterol (VENTOLIN HFA) 108 (90 Base) MCG/ACT inhaler Inhale 1-2 puffs into the lungs every 6 (six) hours as needed for wheezing or shortness of breath. 3 Inhaler 3   budesonide-formoterol (SYMBICORT) 160-4.5 MCG/ACT inhaler Inhale 2 puffs into the lungs 2 (two) times daily. (Patient taking differently: Inhale 2 puffs into the lungs 3 (three) times a week.) 3 Inhaler 3   cetirizine (ZYRTEC) 10 MG tablet Take 10 mg by mouth daily as needed for allergies.     Cholecalciferol (VITAMIN D3) 50 MCG (2000 UT) capsule Take 1 capsule (2,000 Units total) by mouth daily. 100 capsule 3   desvenlafaxine (PRISTIQ) 100 MG 24 hr tablet Take 1 tablet (100 mg total) by mouth daily. 90 tablet 1   gabapentin (NEURONTIN) 300 MG capsule Take  300 mg by mouth daily as needed (Back pain).     ibuprofen (ADVIL) 200 MG tablet Take 400 mg by mouth every 6 (six) hours as needed (Back pain).     Soft Lens Products (SENSITIVE EYES SALINE) SOLN Place 1 drop into both eyes daily. Saline eye lub     valACYclovir (VALTREX) 500 MG tablet Take 1 tablet  (500 mg total) by mouth daily. Increase to bid prn with outbreak 180 tablet 3   VYVANSE 50 MG capsule Take 1 capsule (50 mg total) by mouth every morning. 30 capsule 0   VYVANSE 50 MG capsule Take 1 capsule (50 mg total) by mouth every morning. 30 capsule 0   VYVANSE 50 MG capsule Take 1 capsule (50 mg total) by mouth every morning. 30 capsule 0   HYDROcodone-acetaminophen (NORCO) 10-325 MG tablet Take 0.5-1 tablets by mouth 3 (three) times daily as needed for severe pain. 90 tablet 0   oxyCODONE-acetaminophen (PERCOCET/ROXICET) 5-325 MG tablet Take 1 tablet by mouth every 6 (six) hours as needed for severe pain. 15 tablet 0   pantoprazole (PROTONIX) 20 MG tablet Take 1 tablet (20 mg total) by mouth daily. (Patient taking differently: Take 20 mg by mouth 2 (two) times daily.) 30 tablet 1   levocetirizine (XYZAL) 5 MG tablet Take 1 tablet (5 mg total) by mouth every evening for 15 days. 15 tablet 0   metoprolol succinate (TOPROL-XL) 50 MG 24 hr tablet Take 1 tablet (50 mg total) by mouth daily. Take with or immediately following a meal. (Patient not taking: Reported on 09/26/2021) 30 tablet 11   Facility-Administered Medications Prior to Visit  Medication Dose Route Frequency Provider Last Rate Last Admin   gadopentetate dimeglumine (MAGNEVIST) injection 18 mL  18 mL Intravenous Once PRN Penumalli, Vikram R, MD        ROS: Review of Systems  Constitutional:  Positive for chills and fever. Negative for activity change, appetite change, fatigue and unexpected weight change.  HENT:  Negative for congestion, mouth sores and sinus pressure.   Eyes:  Negative for visual disturbance.  Respiratory:  Negative for cough and chest tightness.   Gastrointestinal:  Positive for abdominal pain, nausea and vomiting.  Genitourinary:  Negative for difficulty urinating, frequency and vaginal pain.  Musculoskeletal:  Positive for back pain. Negative for gait problem.  Skin:  Negative for pallor and rash.   Neurological:  Negative for dizziness, tremors, weakness, numbness and headaches.  Psychiatric/Behavioral:  Negative for confusion, sleep disturbance and suicidal ideas.    Objective:  BP 112/72 (BP Location: Left Arm)    Pulse 80    Temp 98.6 F (37 C) (Oral)    Ht 5\' 4"  (1.626 m)    Wt 186 lb 6.4 oz (84.6 kg)    LMP 04/11/2012 Comment: ablation   SpO2 98%    BMI 32.00 kg/m   BP Readings from Last 3 Encounters:  09/26/21 112/72  09/24/21 (!) 146/80  09/19/21 (!) 144/79    Wt Readings from Last 3 Encounters:  09/26/21 186 lb 6.4 oz (84.6 kg)  09/24/21 185 lb (83.9 kg)  01/03/21 207 lb (93.9 kg)    Physical Exam Constitutional:      General: She is not in acute distress.    Appearance: She is well-developed.  HENT:     Head: Normocephalic.     Right Ear: External ear normal.     Left Ear: External ear normal.     Nose: Nose normal.  Eyes:  General:        Right eye: No discharge.        Left eye: No discharge.     Conjunctiva/sclera: Conjunctivae normal.     Pupils: Pupils are equal, round, and reactive to light.  Neck:     Thyroid: No thyromegaly.     Vascular: No JVD.     Trachea: No tracheal deviation.  Cardiovascular:     Rate and Rhythm: Normal rate and regular rhythm.     Heart sounds: Normal heart sounds.  Pulmonary:     Effort: No respiratory distress.     Breath sounds: No stridor. No wheezing.  Abdominal:     General: Bowel sounds are normal. There is no distension.     Palpations: Abdomen is soft. There is no mass.     Tenderness: There is no abdominal tenderness. There is no guarding or rebound.  Musculoskeletal:        General: No tenderness.     Cervical back: Normal range of motion and neck supple. No rigidity.  Lymphadenopathy:     Cervical: No cervical adenopathy.  Skin:    Findings: No erythema or rash.  Neurological:     Cranial Nerves: No cranial nerve deficit.     Motor: No abnormal muscle tone.     Coordination: Coordination normal.      Deep Tendon Reflexes: Reflexes normal.  Psychiatric:        Behavior: Behavior normal.        Thought Content: Thought content normal.        Judgment: Judgment normal.   RUQ - very tender No rebound  No jaundice     A total time of 45 minutes was spent preparing to see the patient, reviewing tests, x-rays, operative reports and outside records.  Also, obtaining history and performing comprehensive physical exam.  Additionally, counseling the patient regarding the above listed issues.   Finally, documenting clinical information in the health records, coordination of care, educating the patient re: RUQ pain. It is a complex case.  Lab Results  Component Value Date   WBC 7.4 09/24/2021   HGB 15.0 09/24/2021   HCT 44.3 09/24/2021   PLT 265 09/24/2021   GLUCOSE 121 (H) 09/24/2021   CHOL 178 08/23/2008   TRIG 47 08/23/2008   HDL 52.7 08/23/2008   LDLCALC 116 (H) 08/23/2008   ALT 17 09/24/2021   AST 16 09/24/2021   NA 139 09/24/2021   K 3.9 09/24/2021   CL 101 09/24/2021   CREATININE 0.52 09/24/2021   BUN 14 09/24/2021   CO2 28 09/24/2021   TSH 5.12 (H) 10/21/2017    DG Chest Port 1 View  Result Date: 09/24/2021 CLINICAL DATA:  Chest pain EXAM: PORTABLE CHEST 1 VIEW COMPARISON:  Chest x-ray 10/09/2016 FINDINGS: Heart size and mediastinum appear stable and within normal limits. Calcified plaques in the aortic arch. Mild bibasilar opacities most likely represent subsegmental atelectasis. No significant pleural effusion visualized. No pneumothorax. IMPRESSION: Mild bibasilar likely subsegmental atelectasis. Electronically Signed   By: Ofilia Neas M.D.   On: 09/24/2021 08:50   CT Angio Chest/Abd/Pel for Dissection W and/or W/WO  Result Date: 09/24/2021 CLINICAL DATA:  Acute aortic syndrome (AAS) suspected. Right flank pain EXAM: CT ANGIOGRAPHY CHEST, ABDOMEN AND PELVIS TECHNIQUE: Non-contrast CT of the chest was initially obtained. Multidetector CT imaging through the  chest, abdomen and pelvis was performed using the standard protocol during bolus administration of intravenous contrast. Multiplanar reconstructed images and MIPs were obtained  and reviewed to evaluate the vascular anatomy. CONTRAST:  154mL OMNIPAQUE IOHEXOL 350 MG/ML SOLN COMPARISON:  CT renal stone study 09/19/2021. FINDINGS: CTA CHEST FINDINGS Cardiovascular: Moderate coronary artery and scattered aortic calcifications. Heart is normal size. Aorta is normal caliber. No dissection. Mediastinum/Nodes: No mediastinal, hilar, or axillary adenopathy. Trachea and esophagus are unremarkable. Thyroid unremarkable. Lungs/Pleura: Linear scarring or subsegmental atelectasis in the lung bases. Lungs otherwise clear. No effusions. Musculoskeletal: Chest wall soft tissues are unremarkable. Surgical clips in the right breast. No acute bony abnormality. Review of the MIP images confirms the above findings. CTA ABDOMEN AND PELVIS FINDINGS VASCULAR Aorta: Scattered aortic calcifications.  No aneurysm or dissection. Celiac: Patent without evidence of aneurysm, dissection, vasculitis or significant stenosis. SMA: Patent without evidence of aneurysm, dissection, vasculitis or significant stenosis. Renals: Both renal arteries are patent without evidence of aneurysm, dissection, vasculitis, fibromuscular dysplasia or significant stenosis. IMA: Patent without evidence of aneurysm, dissection, vasculitis or significant stenosis. Inflow: Scattered calcifications.  No aneurysm or dissection. Veins: No obvious venous abnormality within the limitations of this arterial phase study. Review of the MIP images confirms the above findings. NON-VASCULAR Hepatobiliary: Prior cholecystectomy. Small subcentimeter hypodensities scattered in the liver, likely small cysts, stable since prior study. Pancreas: No focal abnormality or ductal dilatation. Spleen: No focal abnormality.  Normal size. Adrenals/Urinary Tract: No adrenal abnormality. No focal  renal abnormality. No stones or hydronephrosis. Urinary bladder is unremarkable. Stomach/Bowel: Stomach, large and small bowel grossly unremarkable. Lymphatic: No adenopathy Reproductive: Uterus and adnexa unremarkable.  No mass. Other: No free fluid or free air. Musculoskeletal: No acute bony abnormality. Review of the MIP images confirms the above findings. IMPRESSION: No evidence of aortic aneurysm or dissection. Scattered distal aortic calcifications. No acute cardiopulmonary disease. No acute findings in the abdomen or pelvis. Electronically Signed   By: Rolm Baptise M.D.   On: 09/24/2021 10:12    Assessment & Plan:   Problem List Items Addressed This Visit     GERD    Pt was off Protonix. Lately re-started.  Increase Protonix RTC 10 d      Relevant Medications   pantoprazole (PROTONIX) 40 MG tablet   Other Relevant Orders   Ambulatory referral to Gastroenterology   Nausea & vomiting    RUQ [place] GI consultation offered Phenergan prn       Relevant Orders   Ambulatory referral to Gastroenterology   US Abdomen Limited RUQ (LIVER/GB)   RUQ abdominal pain - Primary    New. ?etiology. R/o cholangitis etc. Obtain RUQ [place]  s/p lap chole Empiric Augmentin po. Increase Protonix RTC 10 d      Relevant Orders   Ambulatory referral to Gastroenterology   US Abdomen Limited RUQ (LIVER/GB)      Meds ordered this encounter  Medications   amoxicillin-clavulanate (AUGMENTIN) 875-125 MG tablet    Sig: Take 1 tablet by mouth 2 (two) times daily.    Dispense:  20 tablet    Refill:  0   promethazine (PHENERGAN) 25 MG tablet    Sig: Take 1 tablet (25 mg total) by mouth every 8 (eight) hours as needed for nausea or vomiting.    Dispense:  30 tablet    Refill:  0   pantoprazole (PROTONIX) 40 MG tablet    Sig: Take 1 tablet (40 mg total) by mouth 2 (two) times daily.    Dispense:  60 tablet    Refill:  5   HYDROcodone-acetaminophen (NORCO) 10-325 MG tablet    Sig:  Take 0.5-1 tablets by  mouth 3 (three) times daily as needed for severe pain.    Dispense:  90 tablet    Refill:  0      Follow-up: Return in about 10 days (around 10/06/2021).  Walker Kehr, MD

## 2021-09-26 NOTE — Assessment & Plan Note (Signed)
RUQ [place] GI consultation offered Phenergan prn

## 2021-09-26 NOTE — Patient Instructions (Signed)
Rib belt

## 2021-09-26 NOTE — Assessment & Plan Note (Signed)
Pt was off Protonix. Lately re-started.  Increase Protonix RTC 10 d

## 2021-09-26 NOTE — Assessment & Plan Note (Signed)
New. ?etiology. R/o cholangitis etc. Obtain RUQ [place]  s/p lap chole Empiric Augmentin po. Increase Protonix RTC 10 d

## 2021-11-13 ENCOUNTER — Other Ambulatory Visit: Payer: Self-pay | Admitting: Internal Medicine

## 2021-11-13 ENCOUNTER — Encounter: Payer: Self-pay | Admitting: Internal Medicine

## 2021-11-14 ENCOUNTER — Other Ambulatory Visit: Payer: Self-pay | Admitting: Internal Medicine

## 2021-11-15 ENCOUNTER — Other Ambulatory Visit: Payer: Self-pay | Admitting: Internal Medicine

## 2021-11-15 MED ORDER — VYVANSE 50 MG PO CAPS: 50.0000 mg | ORAL_CAPSULE | ORAL | 0 refills | Status: DC

## 2021-11-15 MED ORDER — HYDROCODONE-ACETAMINOPHEN 10-325 MG PO TABS: 0.5000 | ORAL_TABLET | Freq: Three times a day (TID) | ORAL | 0 refills | Status: DC | PRN

## 2021-11-15 NOTE — Telephone Encounter (Signed)
Pt states provider requesting to see her "asap"  Informed pt according to mychart response, provider is requesting her to schedule a 3 mo f/u from last ov (09-26-2021)  Is it ok to schedule pt prior to 3 mos (12-25-2021) ?   Pt states pharmacy did not receive refill request for HYDROcodone-acetaminophen (NORCO) 10-325 MG tablet  Please advise

## 2021-11-16 MED ORDER — HYDROCODONE-ACETAMINOPHEN 10-325 MG PO TABS: 0.5000 | ORAL_TABLET | Freq: Three times a day (TID) | ORAL | 0 refills | Status: DC | PRN

## 2021-11-16 NOTE — Progress Notes (Signed)
x

## 2021-12-18 ENCOUNTER — Encounter: Payer: Self-pay | Admitting: Internal Medicine

## 2021-12-18 NOTE — Telephone Encounter (Signed)
Patient is requesting a refill of her Vyvanse, pristiq and hydrocodone.Patient has a f/u appt 12/25/2021 ? ?Please advise  ?

## 2021-12-19 ENCOUNTER — Other Ambulatory Visit: Payer: Self-pay | Admitting: Internal Medicine

## 2021-12-19 ENCOUNTER — Encounter: Payer: Self-pay | Admitting: Internal Medicine

## 2021-12-19 MED ORDER — VYVANSE 50 MG PO CAPS: 50.0000 mg | ORAL_CAPSULE | ORAL | 0 refills | Status: DC

## 2021-12-20 NOTE — Telephone Encounter (Signed)
Patient is requesting a refill of the following medications: ?Requested Prescriptions  ? ?Pending Prescriptions Disp Refills  ? HYDROcodone-acetaminophen (NORCO) 10-325 MG tablet 90 tablet 0  ?  Sig: Take 0.5-1 tablets by mouth 3 (three) times daily as needed for severe pain.  ? ? ?Date of patient request: 12/19/2021 ?Last office visit: 09/26/2021 ?Date of last refill: 11/16/2021 ?Last refill amount: 90 tabs ?Follow up time period per chart: 01/03/2022  ?

## 2021-12-21 ENCOUNTER — Encounter: Payer: Self-pay | Admitting: Internal Medicine

## 2021-12-23 MED ORDER — HYDROCODONE-ACETAMINOPHEN 10-325 MG PO TABS: 0.5000 | ORAL_TABLET | Freq: Three times a day (TID) | ORAL | 0 refills | Status: DC | PRN

## 2021-12-25 ENCOUNTER — Telehealth: Payer: 59 | Admitting: Internal Medicine

## 2022-01-03 ENCOUNTER — Telehealth: Payer: 59 | Admitting: Internal Medicine

## 2022-02-05 ENCOUNTER — Encounter: Payer: Self-pay | Admitting: Internal Medicine

## 2022-02-05 ENCOUNTER — Other Ambulatory Visit: Payer: Self-pay | Admitting: Internal Medicine

## 2022-02-05 MED ORDER — DESVENLAFAXINE SUCCINATE ER 100 MG PO TB24: 100.0000 mg | ORAL_TABLET | Freq: Every day | ORAL | 1 refills | Status: DC

## 2022-02-06 ENCOUNTER — Other Ambulatory Visit: Payer: Self-pay | Admitting: Internal Medicine

## 2022-02-06 MED ORDER — VYVANSE 50 MG PO CAPS: 50.0000 mg | ORAL_CAPSULE | ORAL | 0 refills | Status: DC

## 2022-02-06 MED ORDER — HYDROCODONE-ACETAMINOPHEN 10-325 MG PO TABS: 0.5000 | ORAL_TABLET | Freq: Three times a day (TID) | ORAL | 0 refills | Status: DC | PRN

## 2022-02-06 NOTE — Telephone Encounter (Signed)
Check [place] registry last filled hydrocodone 12/23/2021 and vyvanse 12/19/21.Marland KitchenJohny Chess ? ?

## 2022-02-28 ENCOUNTER — Ambulatory Visit: Payer: 59 | Admitting: Internal Medicine

## 2022-03-11 ENCOUNTER — Other Ambulatory Visit: Payer: Self-pay | Admitting: Internal Medicine

## 2022-03-11 ENCOUNTER — Encounter: Payer: Self-pay | Admitting: Internal Medicine

## 2022-03-12 ENCOUNTER — Encounter: Payer: Self-pay | Admitting: Internal Medicine

## 2022-03-12 NOTE — Telephone Encounter (Signed)
Check [place] registry last filled Vyvanse 02/10/2022 and Hydrocodone 02/06/22.Marland KitchenJohny Chess

## 2022-03-13 NOTE — Telephone Encounter (Signed)
Pt inquiring on her med refills.. Two request been sent to MD

## 2022-03-13 NOTE — Telephone Encounter (Signed)
Pt is requiring about refill for vyvanse & hydrocodone. She states both have been denied. She has appt on 03/19/22.Tamara KitchenJohny Chess

## 2022-03-13 NOTE — Addendum Note (Signed)
Addended by: Earnstine Regal on: 03/13/2022 08:28 AM   Modules accepted: Orders

## 2022-03-14 ENCOUNTER — Other Ambulatory Visit: Payer: Self-pay | Admitting: Internal Medicine

## 2022-03-14 MED ORDER — VYVANSE 50 MG PO CAPS
50.0000 mg | ORAL_CAPSULE | ORAL | 0 refills | Status: DC
Start: 2022-03-14 — End: 2022-03-19

## 2022-03-14 MED ORDER — HYDROCODONE-ACETAMINOPHEN 10-325 MG PO TABS: 0.5000 | ORAL_TABLET | Freq: Three times a day (TID) | ORAL | 0 refills | Status: DC | PRN

## 2022-03-19 ENCOUNTER — Ambulatory Visit: Payer: 59 | Admitting: Internal Medicine

## 2022-03-19 ENCOUNTER — Encounter: Payer: Self-pay | Admitting: Internal Medicine

## 2022-03-19 VITALS — BP 122/82 | HR 100 | Temp 98.0°F | Ht 64.0 in | Wt 205.0 lb

## 2022-03-19 DIAGNOSIS — E6609 Other obesity due to excess calories: Secondary | ICD-10-CM

## 2022-03-19 DIAGNOSIS — G8929 Other chronic pain: Secondary | ICD-10-CM

## 2022-03-19 DIAGNOSIS — M5440 Lumbago with sciatica, unspecified side: Secondary | ICD-10-CM | POA: Diagnosis not present

## 2022-03-19 DIAGNOSIS — Z6832 Body mass index (BMI) 32.0-32.9, adult: Secondary | ICD-10-CM

## 2022-03-19 DIAGNOSIS — Z0000 Encounter for general adult medical examination without abnormal findings: Secondary | ICD-10-CM

## 2022-03-19 DIAGNOSIS — F909 Attention-deficit hyperactivity disorder, unspecified type: Secondary | ICD-10-CM

## 2022-03-19 DIAGNOSIS — R739 Hyperglycemia, unspecified: Secondary | ICD-10-CM

## 2022-03-19 DIAGNOSIS — N879 Dysplasia of cervix uteri, unspecified: Secondary | ICD-10-CM | POA: Insufficient documentation

## 2022-03-19 MED ORDER — VYVANSE 50 MG PO CAPS
50.0000 mg | ORAL_CAPSULE | ORAL | 0 refills | Status: DC
Start: 2022-03-19 — End: 2022-05-17

## 2022-03-19 MED ORDER — VYVANSE 50 MG PO CAPS: 50.0000 mg | ORAL_CAPSULE | ORAL | 0 refills | Status: DC

## 2022-03-19 MED ORDER — VYVANSE 50 MG PO CAPS
50.0000 mg | ORAL_CAPSULE | ORAL | 0 refills | Status: DC
Start: 2022-03-19 — End: 2022-08-07

## 2022-03-19 NOTE — Assessment & Plan Note (Signed)
Cont on Norco 10/325 po qid prn w/caution  Potential benefits of a long term opioids use as well as potential risks (i.e. addiction risk, apnea etc) and complications (i.e. Somnolence, constipation and others) were explained to the patient and were aknowledged. 

## 2022-03-19 NOTE — Assessment & Plan Note (Signed)
Worse Check A1c

## 2022-03-19 NOTE — Assessment & Plan Note (Signed)
Cont on Vyvance 50 mg q am  Potential benefits of a long term stimulant use as well as potential risks  and complications were explained to the patient and were aknowledged.

## 2022-03-19 NOTE — Progress Notes (Signed)
Subjective:  Patient ID: Tamara Cross, female    DOB: [birth year]  Age: 56 y.o. MRN: [id number]  CC: No chief complaint on file.   HPI Tamara Cross presents for LBP, depression, ADD  Outpatient Medications Prior to Visit  Medication Sig Dispense Refill   acetaminophen (TYLENOL) 500 MG tablet Take 1,000 mg by mouth every 6 (six) hours as needed for headache.     albuterol (VENTOLIN HFA) 108 (90 Base) MCG/ACT inhaler Inhale 1-2 puffs into the lungs every 6 (six) hours as needed for wheezing or shortness of breath. 3 Inhaler 3   amoxicillin-clavulanate (AUGMENTIN) 875-125 MG tablet Take 1 tablet by mouth 2 (two) times daily. 20 tablet 0   budesonide-formoterol (SYMBICORT) 160-4.5 MCG/ACT inhaler Inhale 2 puffs into the lungs 2 (two) times daily. (Patient taking differently: Inhale 2 puffs into the lungs 3 (three) times a week.) 3 Inhaler 3   cetirizine (ZYRTEC) 10 MG tablet Take 10 mg by mouth daily as needed for allergies.     Cholecalciferol (VITAMIN D3) 50 MCG (2000 UT) capsule Take 1 capsule (2,000 Units total) by mouth daily. 100 capsule 3   desvenlafaxine (PRISTIQ) 100 MG 24 hr tablet Take 1 tablet (100 mg total) by mouth daily. 90 tablet 1   gabapentin (NEURONTIN) 300 MG capsule Take 300 mg by mouth daily as needed (Back pain).     HYDROcodone-acetaminophen (NORCO) 10-325 MG tablet Take 0.5-1 tablets by mouth 3 (three) times daily as needed for severe pain. 90 tablet 0   ibuprofen (ADVIL) 200 MG tablet Take 400 mg by mouth every 6 (six) hours as needed (Back pain).     pantoprazole (PROTONIX) 40 MG tablet Take 1 tablet (40 mg total) by mouth 2 (two) times daily. 60 tablet 5   Soft Lens Products (SENSITIVE EYES SALINE) SOLN Place 1 drop into both eyes daily. Saline eye lub     valACYclovir (VALTREX) 500 MG tablet Take 1 tablet (500 mg total) by mouth daily. Increase to bid prn with outbreak 180 tablet 3   promethazine (PHENERGAN) 25 MG tablet Take 1 tablet (25 mg total) by mouth every  8 (eight) hours as needed for nausea or vomiting. 30 tablet 0   VYVANSE 50 MG capsule Take 1 capsule (50 mg total) by mouth every morning. 30 capsule 0   VYVANSE 50 MG capsule Take 1 capsule (50 mg total) by mouth every morning. 30 capsule 0   VYVANSE 50 MG capsule Take 1 capsule (50 mg total) by mouth every morning. 30 capsule 0   metoprolol succinate (TOPROL-XL) 50 MG 24 hr tablet Take 1 tablet (50 mg total) by mouth daily. Take with or immediately following a meal. (Patient not taking: Reported on 09/26/2021) 30 tablet 11   levocetirizine (XYZAL) 5 MG tablet Take 1 tablet (5 mg total) by mouth every evening for 15 days. 15 tablet 0   Facility-Administered Medications Prior to Visit  Medication Dose Route Frequency Provider Last Rate Last Admin   gadopentetate dimeglumine (MAGNEVIST) injection 18 mL  18 mL Intravenous Once PRN Penumalli, Vikram R, MD        ROS: Review of Systems  Constitutional:  Negative for activity change, appetite change, chills, fatigue and unexpected weight change.  HENT:  Negative for congestion, mouth sores and sinus pressure.   Eyes:  Negative for visual disturbance.  Respiratory:  Negative for cough and chest tightness.   Gastrointestinal:  Negative for abdominal pain and nausea.  Genitourinary:  Negative for difficulty urinating,  frequency and vaginal pain.  Musculoskeletal:  Positive for arthralgias. Negative for back pain and gait problem.  Skin:  Negative for pallor and rash.  Neurological:  Negative for dizziness, tremors, weakness, numbness and headaches.  Psychiatric/Behavioral:  Negative for confusion and sleep disturbance. The patient is not nervous/anxious.     Objective:  BP 122/82 (BP Location: Left Arm, Patient Position: Sitting, Cuff Size: Large)   Pulse 100   Temp 98 F (36.7 C) (Oral)   Ht '5\' 4"'$  (1.626 m)   Wt 205 lb (93 kg)   LMP 04/11/2012 Comment: ablation  SpO2 97%   BMI 35.19 kg/m   BP Readings from Last 3 Encounters:  03/19/22  122/82  09/26/21 112/72  09/24/21 (!) 146/80    Wt Readings from Last 3 Encounters:  03/19/22 205 lb (93 kg)  09/26/21 186 lb 6.4 oz (84.6 kg)  09/24/21 185 lb (83.9 kg)    Physical Exam Constitutional:      General: She is not in acute distress.    Appearance: She is well-developed.  HENT:     Head: Normocephalic.     Right Ear: External ear normal.     Left Ear: External ear normal.     Nose: Nose normal.  Eyes:     General:        Right eye: No discharge.        Left eye: No discharge.     Conjunctiva/sclera: Conjunctivae normal.     Pupils: Pupils are equal, round, and reactive to light.  Neck:     Thyroid: No thyromegaly.     Vascular: No JVD.     Trachea: No tracheal deviation.  Cardiovascular:     Rate and Rhythm: Normal rate and regular rhythm.     Heart sounds: Normal heart sounds.  Pulmonary:     Effort: No respiratory distress.     Breath sounds: No stridor. No wheezing.  Abdominal:     General: Bowel sounds are normal. There is no distension.     Palpations: Abdomen is soft. There is no mass.     Tenderness: There is no abdominal tenderness. There is no guarding or rebound.  Musculoskeletal:        General: No tenderness.     Cervical back: Normal range of motion and neck supple. No rigidity.  Lymphadenopathy:     Cervical: No cervical adenopathy.  Skin:    Findings: No erythema or rash.  Neurological:     Cranial Nerves: No cranial nerve deficit.     Motor: No abnormal muscle tone.     Coordination: Coordination normal.     Deep Tendon Reflexes: Reflexes normal.  Psychiatric:        Behavior: Behavior normal.        Thought Content: Thought content normal.        Judgment: Judgment normal.     Lab Results  Component Value Date   WBC 7.4 09/24/2021   HGB 15.0 09/24/2021   HCT 44.3 09/24/2021   PLT 265 09/24/2021   GLUCOSE 121 (H) 09/24/2021   CHOL 178 08/23/2008   TRIG 47 08/23/2008   HDL 52.7 08/23/2008   LDLCALC 116 (H) 08/23/2008    ALT 17 09/24/2021   AST 16 09/24/2021   NA 139 09/24/2021   K 3.9 09/24/2021   CL 101 09/24/2021   CREATININE 0.52 09/24/2021   BUN 14 09/24/2021   CO2 28 09/24/2021   TSH 5.12 (H) 10/21/2017    CT Angio  Chest/Abd/Pel for Dissection W and/or W/WO  Result Date: 09/24/2021 CLINICAL DATA:  Acute aortic syndrome (AAS) suspected. Right flank pain EXAM: CT ANGIOGRAPHY CHEST, ABDOMEN AND PELVIS TECHNIQUE: Non-contrast CT of the chest was initially obtained. Multidetector CT imaging through the chest, abdomen and pelvis was performed using the standard protocol during bolus administration of intravenous contrast. Multiplanar reconstructed images and MIPs were obtained and reviewed to evaluate the vascular anatomy. CONTRAST:  175m OMNIPAQUE IOHEXOL 350 MG/ML SOLN COMPARISON:  CT renal stone study 09/19/2021. FINDINGS: CTA CHEST FINDINGS Cardiovascular: Moderate coronary artery and scattered aortic calcifications. Heart is normal size. Aorta is normal caliber. No dissection. Mediastinum/Nodes: No mediastinal, hilar, or axillary adenopathy. Trachea and esophagus are unremarkable. Thyroid unremarkable. Lungs/Pleura: Linear scarring or subsegmental atelectasis in the lung bases. Lungs otherwise clear. No effusions. Musculoskeletal: Chest wall soft tissues are unremarkable. Surgical clips in the right breast. No acute bony abnormality. Review of the MIP images confirms the above findings. CTA ABDOMEN AND PELVIS FINDINGS VASCULAR Aorta: Scattered aortic calcifications.  No aneurysm or dissection. Celiac: Patent without evidence of aneurysm, dissection, vasculitis or significant stenosis. SMA: Patent without evidence of aneurysm, dissection, vasculitis or significant stenosis. Renals: Both renal arteries are patent without evidence of aneurysm, dissection, vasculitis, fibromuscular dysplasia or significant stenosis. IMA: Patent without evidence of aneurysm, dissection, vasculitis or significant stenosis. Inflow:  Scattered calcifications.  No aneurysm or dissection. Veins: No obvious venous abnormality within the limitations of this arterial phase study. Review of the MIP images confirms the above findings. NON-VASCULAR Hepatobiliary: Prior cholecystectomy. Small subcentimeter hypodensities scattered in the liver, likely small cysts, stable since prior study. Pancreas: No focal abnormality or ductal dilatation. Spleen: No focal abnormality.  Normal size. Adrenals/Urinary Tract: No adrenal abnormality. No focal renal abnormality. No stones or hydronephrosis. Urinary bladder is unremarkable. Stomach/Bowel: Stomach, large and small bowel grossly unremarkable. Lymphatic: No adenopathy Reproductive: Uterus and adnexa unremarkable.  No mass. Other: No free fluid or free air. Musculoskeletal: No acute bony abnormality. Review of the MIP images confirms the above findings. IMPRESSION: No evidence of aortic aneurysm or dissection. Scattered distal aortic calcifications. No acute cardiopulmonary disease. No acute findings in the abdomen or pelvis. Electronically Signed   By: KRolm BaptiseM.D.   On: 09/24/2021 10:12   DG Chest Port 1 View  Result Date: 09/24/2021 CLINICAL DATA:  Chest pain EXAM: PORTABLE CHEST 1 VIEW COMPARISON:  Chest x-ray 10/09/2016 FINDINGS: Heart size and mediastinum appear stable and within normal limits. Calcified plaques in the aortic arch. Mild bibasilar opacities most likely represent subsegmental atelectasis. No significant pleural effusion visualized. No pneumothorax. IMPRESSION: Mild bibasilar likely subsegmental atelectasis. Electronically Signed   By: DOfilia NeasM.D.   On: 09/24/2021 08:50    Assessment & Plan:   Problem List Items Addressed This Visit     ADHD (attention deficit hyperactivity disorder)    Cont on Vyvance 50 mg q am  Potential benefits of a long term stimulant use as well as potential risks  and complications were explained to the patient and were aknowledged.       Hyperglycemia    Weight gain Check A1c Diet discussed      Low back pain    Cont on Norco 10/325 po qid prn w/caution  Potential benefits of a long term opioids use as well as potential risks (i.e. addiction risk, apnea etc) and complications (i.e. Somnolence, constipation and others) were explained to the patient and were aknowledged.      Obesity  Worse Check A1c      Relevant Medications   VYVANSE 50 MG capsule   VYVANSE 50 MG capsule   VYVANSE 50 MG capsule      Meds ordered this encounter  Medications   VYVANSE 50 MG capsule    Sig: Take 1 capsule (50 mg total) by mouth every morning.    Dispense:  30 capsule    Refill:  0    Please fill on or after 06/12/22   VYVANSE 50 MG capsule    Sig: Take 1 capsule (50 mg total) by mouth every morning.    Dispense:  30 capsule    Refill:  0    Please fill on or after 05/13/22   VYVANSE 50 MG capsule    Sig: Take 1 capsule (50 mg total) by mouth every morning.    Dispense:  30 capsule    Refill:  0    Please fill on or after 04/13/2022      Follow-up: No follow-ups on file.  Walker Kehr, MD

## 2022-03-19 NOTE — Assessment & Plan Note (Signed)
Weight gain Check A1c Diet discussed

## 2022-04-16 ENCOUNTER — Other Ambulatory Visit: Payer: Self-pay | Admitting: Internal Medicine

## 2022-04-17 ENCOUNTER — Encounter: Payer: Self-pay | Admitting: Internal Medicine

## 2022-04-17 ENCOUNTER — Other Ambulatory Visit: Payer: Self-pay | Admitting: Internal Medicine

## 2022-04-17 MED ORDER — HYDROCODONE-ACETAMINOPHEN 10-325 MG PO TABS: 0.5000 | ORAL_TABLET | Freq: Three times a day (TID) | ORAL | 0 refills | Status: DC | PRN

## 2022-04-22 ENCOUNTER — Other Ambulatory Visit: Payer: Self-pay | Admitting: Internal Medicine

## 2022-04-22 MED ORDER — HYDROCODONE-ACETAMINOPHEN 10-325 MG PO TABS: 1.0000 | ORAL_TABLET | Freq: Three times a day (TID) | ORAL | 0 refills | Status: DC | PRN

## 2022-04-22 NOTE — Progress Notes (Signed)
x

## 2022-05-16 ENCOUNTER — Encounter: Payer: Self-pay | Admitting: Internal Medicine

## 2022-05-17 ENCOUNTER — Other Ambulatory Visit: Payer: Self-pay | Admitting: Internal Medicine

## 2022-05-17 MED ORDER — HYDROCODONE-ACETAMINOPHEN 10-325 MG PO TABS: 1.0000 | ORAL_TABLET | Freq: Three times a day (TID) | ORAL | 0 refills | Status: DC | PRN

## 2022-05-17 MED ORDER — VYVANSE 50 MG PO CAPS
50.0000 mg | ORAL_CAPSULE | ORAL | 0 refills | Status: DC
Start: 2022-05-17 — End: 2022-08-07

## 2022-05-17 NOTE — Telephone Encounter (Signed)
Check [place] registry last filled Hydrocodone 04/17/2022 and Vyvanse 03/18/22.Marland KitchenJohny Chess

## 2022-05-30 ENCOUNTER — Ambulatory Visit: Payer: 59 | Admitting: Internal Medicine

## 2022-06-24 ENCOUNTER — Telehealth: Payer: Self-pay

## 2022-06-24 NOTE — Telephone Encounter (Signed)
Left message pertaining to scheduling a mammogram.

## 2022-07-29 ENCOUNTER — Telehealth: Payer: Self-pay | Admitting: Internal Medicine

## 2022-07-29 MED ORDER — DESVENLAFAXINE SUCCINATE ER 100 MG PO TB24
100.0000 mg | ORAL_TABLET | Freq: Every day | ORAL | 0 refills | Status: DC
Start: 2022-07-29 — End: 2022-08-07

## 2022-07-29 NOTE — Telephone Encounter (Signed)
Caller & Relationship to patient: Self  Call back number: [phone]   Date of last office visit: 6.13.23  Date of next office visit: 11.1.23  Medication(s) to be refilled:  desvenlafaxine (PRISTIQ) 100 MG 24 hr tablet  VYVANSE 50 MG capsule  HYDROcodone-acetaminophen (NORCO) 10-325 MG tablet    Preferred Pharmacy:  [id number]  Pt need the PRISTIQ RX the most urgently, if we have to wait until her next OV on 11.1.23 to refill the other medication she understands, but is not supposed to miss any days on her PRISTIQ

## 2022-07-29 NOTE — Telephone Encounter (Signed)
I will renew Pristiq only.  Tamara Cross was a no-show for her August appointment. Thanks

## 2022-07-30 NOTE — Telephone Encounter (Signed)
Notified pt w/MD response.../lmb 

## 2022-08-07 ENCOUNTER — Encounter: Payer: Self-pay | Admitting: Internal Medicine

## 2022-08-07 ENCOUNTER — Telehealth: Payer: Self-pay | Admitting: Internal Medicine

## 2022-08-07 ENCOUNTER — Ambulatory Visit: Payer: 59 | Admitting: Internal Medicine

## 2022-08-07 DIAGNOSIS — F410 Panic disorder [episodic paroxysmal anxiety] without agoraphobia: Secondary | ICD-10-CM

## 2022-08-07 DIAGNOSIS — M5440 Lumbago with sciatica, unspecified side: Secondary | ICD-10-CM | POA: Diagnosis not present

## 2022-08-07 DIAGNOSIS — R739 Hyperglycemia, unspecified: Secondary | ICD-10-CM

## 2022-08-07 DIAGNOSIS — G8929 Other chronic pain: Secondary | ICD-10-CM

## 2022-08-07 DIAGNOSIS — F909 Attention-deficit hyperactivity disorder, unspecified type: Secondary | ICD-10-CM

## 2022-08-07 DIAGNOSIS — Z0000 Encounter for general adult medical examination without abnormal findings: Secondary | ICD-10-CM | POA: Diagnosis not present

## 2022-08-07 DIAGNOSIS — M170 Bilateral primary osteoarthritis of knee: Secondary | ICD-10-CM

## 2022-08-07 DIAGNOSIS — H60339 Swimmer's ear, unspecified ear: Secondary | ICD-10-CM | POA: Insufficient documentation

## 2022-08-07 DIAGNOSIS — H60331 Swimmer's ear, right ear: Secondary | ICD-10-CM

## 2022-08-07 LAB — CBC WITH DIFFERENTIAL/PLATELET
Basophils Absolute: 0 10*3/uL (ref 0.0–0.1)
Basophils Relative: 0.3 % (ref 0.0–3.0)
Eosinophils Absolute: 0.1 10*3/uL (ref 0.0–0.7)
Eosinophils Relative: 0.9 % (ref 0.0–5.0)
HCT: 42.9 % (ref 36.0–46.0)
Hemoglobin: 14.4 g/dL (ref 12.0–15.0)
Lymphocytes Relative: 29.9 % (ref 12.0–46.0)
Lymphs Abs: 3 10*3/uL (ref 0.7–4.0)
MCHC: 33.5 g/dL (ref 30.0–36.0)
MCV: 89.7 fl (ref 78.0–100.0)
Monocytes Absolute: 0.7 10*3/uL (ref 0.1–1.0)
Monocytes Relative: 7.3 % (ref 3.0–12.0)
Neutro Abs: 6.2 10*3/uL (ref 1.4–7.7)
Neutrophils Relative %: 61.6 % (ref 43.0–77.0)
Platelets: 244 10*3/uL (ref 150.0–400.0)
RBC: 4.79 Mil/uL (ref 3.87–5.11)
RDW: 13.4 % (ref 11.5–15.5)
WBC: 10.1 10*3/uL (ref 4.0–10.5)

## 2022-08-07 LAB — COMPREHENSIVE METABOLIC PANEL
ALT: 19 U/L (ref 0–35)
AST: 17 U/L (ref 0–37)
Albumin: 4.2 g/dL (ref 3.5–5.2)
Alkaline Phosphatase: 73 U/L (ref 39–117)
BUN: 12 mg/dL (ref 6–23)
CO2: 32 mEq/L (ref 19–32)
Calcium: 8.9 mg/dL (ref 8.4–10.5)
Chloride: 101 mEq/L (ref 96–112)
Creatinine, Ser: 0.65 mg/dL (ref 0.40–1.20)
GFR: 97.04 mL/min (ref >60.00)
Glucose, Bld: 86 mg/dL (ref 70–99)
Potassium: 3.7 mEq/L (ref 3.5–5.1)
Sodium: 140 mEq/L (ref 135–145)
Total Bilirubin: 0.7 mg/dL (ref 0.2–1.2)
Total Protein: 6.5 g/dL (ref 6.0–8.3)

## 2022-08-07 LAB — HEMOGLOBIN A1C: Hgb A1c MFr Bld: 6.2 % (ref 4.6–6.5)

## 2022-08-07 LAB — TSH: TSH: 8 u[IU]/mL — ABNORMAL HIGH (ref 0.35–5.50)

## 2022-08-07 LAB — LIPID PANEL
Cholesterol: 224 mg/dL — ABNORMAL HIGH (ref 0–200)
HDL: 65.6 mg/dL (ref >39.00)
LDL Cholesterol: 142 mg/dL — ABNORMAL HIGH (ref 0–99)
NonHDL: 158.15
Total CHOL/HDL Ratio: 3
Triglycerides: 79 mg/dL (ref 0.0–149.0)
VLDL: 15.8 mg/dL (ref 0.0–40.0)

## 2022-08-07 LAB — URINALYSIS
Hgb urine dipstick: NEGATIVE
Leukocytes,Ua: NEGATIVE
Nitrite: NEGATIVE
Specific Gravity, Urine: 1.025 (ref 1.000–1.030)
Urine Glucose: NEGATIVE
Urobilinogen, UA: 1 (ref 0.0–1.0)
pH: 6 (ref 5.0–8.0)

## 2022-08-07 MED ORDER — VYVANSE 50 MG PO CAPS: 50.0000 mg | ORAL_CAPSULE | ORAL | 0 refills | Status: DC

## 2022-08-07 MED ORDER — HYDROCODONE-ACETAMINOPHEN 10-325 MG PO TABS: 0.5000 | ORAL_TABLET | Freq: Three times a day (TID) | ORAL | 0 refills | Status: DC | PRN

## 2022-08-07 MED ORDER — NEOMYCIN-POLYMYXIN-HC 3.5-10000-1 OT SOLN: 3.0000 [drp] | Freq: Three times a day (TID) | OTIC | 1 refills | Status: AC

## 2022-08-07 MED ORDER — DESVENLAFAXINE SUCCINATE ER 100 MG PO TB24: 100.0000 mg | ORAL_TABLET | Freq: Every day | ORAL | 3 refills | Status: DC

## 2022-08-07 NOTE — Assessment & Plan Note (Addendum)
Blue-Emu cream was recommended to use 2-3 times a day ? ?

## 2022-08-07 NOTE — Telephone Encounter (Signed)
Pt called.  New pharmacy:  [facility]. [address], [place].  [place] [phone] fax [phone]  HYDROCODONE-ACETAMINOPHEN 10-325 MG (out of stock at [facility])   Please change preferred pharmacy to [facility].  Going forward do not use [facility].

## 2022-08-07 NOTE — Assessment & Plan Note (Signed)
Ear gtt

## 2022-08-07 NOTE — Assessment & Plan Note (Signed)
Check A1c. 

## 2022-08-07 NOTE — Assessment & Plan Note (Signed)
On Pristique

## 2022-08-07 NOTE — Assessment & Plan Note (Signed)
RTC 3 mo Cont on Vyvance 50 mg q am  Potential benefits of a long term stimulant use as well as potential risks  and complications were explained to the patient and were aknowledged.

## 2022-08-07 NOTE — Patient Instructions (Signed)
Blue-Emu cream -- use 2-3 times a day ? ?

## 2022-08-07 NOTE — Assessment & Plan Note (Signed)
Cont on Norco 10/325 po qid prn w/caution  Potential benefits of a long term opioids use as well as potential risks (i.e. addiction risk, apnea etc) and complications (i.e. Somnolence, constipation and others) were explained to the patient and were aknowledged.

## 2022-08-07 NOTE — Progress Notes (Signed)
Subjective:  Patient ID: Tamara Cross, female    DOB: [birth year]  Age: 56 y.o. MRN: [id number]  CC: Medication Refill   HPI YAILEEN HOFFERBER presents for LBP, depression, ADD C/o ear pain R, OA  Outpatient Medications Prior to Visit  Medication Sig Dispense Refill   acetaminophen (TYLENOL) 500 MG tablet Take 1,000 mg by mouth every 6 (six) hours as needed for headache.     albuterol (VENTOLIN HFA) 108 (90 Base) MCG/ACT inhaler Inhale 1-2 puffs into the lungs every 6 (six) hours as needed for wheezing or shortness of breath. 3 Inhaler 3   budesonide-formoterol (SYMBICORT) 160-4.5 MCG/ACT inhaler Inhale 2 puffs into the lungs 2 (two) times daily. (Patient taking differently: Inhale 2 puffs into the lungs 3 (three) times a week.) 3 Inhaler 3   cetirizine (ZYRTEC) 10 MG tablet Take 10 mg by mouth daily as needed for allergies.     HYDROcodone-acetaminophen (NORCO) 10-325 MG tablet Take 1 tablet by mouth every 8 (eight) hours as needed. 90 tablet 0   ibuprofen (ADVIL) 200 MG tablet Take 400 mg by mouth every 6 (six) hours as needed (Back pain).     metoprolol succinate (TOPROL-XL) 50 MG 24 hr tablet Take 1 tablet (50 mg total) by mouth daily. Take with or immediately following a meal. (Patient not taking: Reported on 09/26/2021) 30 tablet 11   pantoprazole (PROTONIX) 40 MG tablet Take 1 tablet (40 mg total) by mouth 2 (two) times daily. 60 tablet 5   Soft Lens Products (SENSITIVE EYES SALINE) SOLN Place 1 drop into both eyes daily. Saline eye lub     valACYclovir (VALTREX) 500 MG tablet Take 1 tablet (500 mg total) by mouth daily. Increase to bid prn with outbreak 180 tablet 3   amoxicillin-clavulanate (AUGMENTIN) 875-125 MG tablet Take 1 tablet by mouth 2 (two) times daily. (Patient not taking: Reported on 08/07/2022) 20 tablet 0   Cholecalciferol (VITAMIN D3) 50 MCG (2000 UT) capsule Take 1 capsule (2,000 Units total) by mouth daily. (Patient not taking: Reported on 08/07/2022) 100 capsule 3    desvenlafaxine (PRISTIQ) 100 MG 24 hr tablet Take 1 tablet (100 mg total) by mouth daily. 30 tablet 0   gabapentin (NEURONTIN) 300 MG capsule Take 300 mg by mouth daily as needed (Back pain). (Patient not taking: Reported on 08/07/2022)     HYDROcodone-acetaminophen (NORCO) 10-325 MG tablet Take 0.5-1 tablets by mouth 3 (three) times daily as needed for severe pain. (Patient not taking: Reported on 08/07/2022) 90 tablet 0   VYVANSE 50 MG capsule Take 1 capsule (50 mg total) by mouth every morning. 30 capsule 0   VYVANSE 50 MG capsule Take 1 capsule (50 mg total) by mouth every morning. 30 capsule 0   VYVANSE 50 MG capsule Take 1 capsule (50 mg total) by mouth every morning. 30 capsule 0   Facility-Administered Medications Prior to Visit  Medication Dose Route Frequency Provider Last Rate Last Admin   gadopentetate dimeglumine (MAGNEVIST) injection 18 mL  18 mL Intravenous Once PRN Penumalli, Vikram R, MD        ROS: Review of Systems  Constitutional:  Negative for activity change, appetite change, chills, fatigue and unexpected weight change.  HENT:  Negative for congestion, mouth sores and sinus pressure.   Eyes:  Negative for visual disturbance.  Respiratory:  Negative for cough and chest tightness.   Gastrointestinal:  Negative for abdominal pain and nausea.  Genitourinary:  Negative for difficulty urinating, frequency and vaginal pain.  Musculoskeletal:  Positive for gait problem. Negative for back pain.  Skin:  Negative for pallor and rash.  Neurological:  Negative for dizziness, tremors, weakness, numbness and headaches.  Psychiatric/Behavioral:  Negative for confusion, sleep disturbance and suicidal ideas. The patient is nervous/anxious.     Objective:  BP 130/78 (BP Location: Left Arm)   Pulse 75   Temp 98.5 F (36.9 C) (Oral)   Ht '5\' 4"'$  (1.626 m)   Wt 214 lb 9.6 oz (97.3 kg)   LMP 04/11/2012 Comment: ablation  SpO2 96%   BMI 36.84 kg/m   BP Readings from Last 3  Encounters:  08/07/22 130/78  03/19/22 122/82  09/26/21 112/72    Wt Readings from Last 3 Encounters:  08/07/22 214 lb 9.6 oz (97.3 kg)  03/19/22 205 lb (93 kg)  09/26/21 186 lb 6.4 oz (84.6 kg)    Physical Exam Constitutional:      General: She is not in acute distress.    Appearance: She is well-developed. She is obese.  HENT:     Head: Normocephalic.     Right Ear: External ear normal.     Left Ear: External ear normal.     Nose: Nose normal.  Eyes:     General:        Right eye: No discharge.        Left eye: No discharge.     Conjunctiva/sclera: Conjunctivae normal.     Pupils: Pupils are equal, round, and reactive to light.  Neck:     Thyroid: No thyromegaly.     Vascular: No JVD.     Trachea: No tracheal deviation.  Cardiovascular:     Rate and Rhythm: Normal rate and regular rhythm.     Heart sounds: Normal heart sounds.  Pulmonary:     Effort: No respiratory distress.     Breath sounds: No stridor. No wheezing.  Abdominal:     General: Bowel sounds are normal. There is no distension.     Palpations: Abdomen is soft. There is no mass.     Tenderness: There is no abdominal tenderness. There is no guarding or rebound.  Musculoskeletal:        General: No tenderness.     Cervical back: Normal range of motion and neck supple. No rigidity.  Lymphadenopathy:     Cervical: No cervical adenopathy.  Skin:    Findings: No erythema or rash.  Neurological:     Cranial Nerves: No cranial nerve deficit.     Motor: No abnormal muscle tone.     Coordination: Coordination normal.     Deep Tendon Reflexes: Reflexes normal.  Psychiatric:        Behavior: Behavior normal.        Thought Content: Thought content normal.        Judgment: Judgment normal.   R ear canal w/mild erythema    A total time of 45 minutes was spent preparing to see the patient, reviewing tests, x-rays and performing comprehensive physical exam.  Additionally, counseling the patient regarding the  above listed issues.   Finally, documenting clinical information in the health records, coordination of care, educating the patient re OE, meds   Lab Results  Component Value Date   WBC 7.4 09/24/2021   HGB 15.0 09/24/2021   HCT 44.3 09/24/2021   PLT 265 09/24/2021   GLUCOSE 121 (H) 09/24/2021   CHOL 178 08/23/2008   TRIG 47 08/23/2008   HDL 52.7 08/23/2008   LDLCALC 116 (H)  08/23/2008   ALT 17 09/24/2021   AST 16 09/24/2021   NA 139 09/24/2021   K 3.9 09/24/2021   CL 101 09/24/2021   CREATININE 0.52 09/24/2021   BUN 14 09/24/2021   CO2 28 09/24/2021   TSH 5.12 (H) 10/21/2017    CT Angio Chest/Abd/Pel for Dissection W and/or W/WO  Result Date: 09/24/2021 CLINICAL DATA:  Acute aortic syndrome (AAS) suspected. Right flank pain EXAM: CT ANGIOGRAPHY CHEST, ABDOMEN AND PELVIS TECHNIQUE: Non-contrast CT of the chest was initially obtained. Multidetector CT imaging through the chest, abdomen and pelvis was performed using the standard protocol during bolus administration of intravenous contrast. Multiplanar reconstructed images and MIPs were obtained and reviewed to evaluate the vascular anatomy. CONTRAST:  165m OMNIPAQUE IOHEXOL 350 MG/ML SOLN COMPARISON:  CT renal stone study 09/19/2021. FINDINGS: CTA CHEST FINDINGS Cardiovascular: Moderate coronary artery and scattered aortic calcifications. Heart is normal size. Aorta is normal caliber. No dissection. Mediastinum/Nodes: No mediastinal, hilar, or axillary adenopathy. Trachea and esophagus are unremarkable. Thyroid unremarkable. Lungs/Pleura: Linear scarring or subsegmental atelectasis in the lung bases. Lungs otherwise clear. No effusions. Musculoskeletal: Chest wall soft tissues are unremarkable. Surgical clips in the right breast. No acute bony abnormality. Review of the MIP images confirms the above findings. CTA ABDOMEN AND PELVIS FINDINGS VASCULAR Aorta: Scattered aortic calcifications.  No aneurysm or dissection. Celiac: Patent without  evidence of aneurysm, dissection, vasculitis or significant stenosis. SMA: Patent without evidence of aneurysm, dissection, vasculitis or significant stenosis. Renals: Both renal arteries are patent without evidence of aneurysm, dissection, vasculitis, fibromuscular dysplasia or significant stenosis. IMA: Patent without evidence of aneurysm, dissection, vasculitis or significant stenosis. Inflow: Scattered calcifications.  No aneurysm or dissection. Veins: No obvious venous abnormality within the limitations of this arterial phase study. Review of the MIP images confirms the above findings. NON-VASCULAR Hepatobiliary: Prior cholecystectomy. Small subcentimeter hypodensities scattered in the liver, likely small cysts, stable since prior study. Pancreas: No focal abnormality or ductal dilatation. Spleen: No focal abnormality.  Normal size. Adrenals/Urinary Tract: No adrenal abnormality. No focal renal abnormality. No stones or hydronephrosis. Urinary bladder is unremarkable. Stomach/Bowel: Stomach, large and small bowel grossly unremarkable. Lymphatic: No adenopathy Reproductive: Uterus and adnexa unremarkable.  No mass. Other: No free fluid or free air. Musculoskeletal: No acute bony abnormality. Review of the MIP images confirms the above findings. IMPRESSION: No evidence of aortic aneurysm or dissection. Scattered distal aortic calcifications. No acute cardiopulmonary disease. No acute findings in the abdomen or pelvis. Electronically Signed   By: KRolm BaptiseM.D.   On: 09/24/2021 10:12   DG Chest Port 1 View  Result Date: 09/24/2021 CLINICAL DATA:  Chest pain EXAM: PORTABLE CHEST 1 VIEW COMPARISON:  Chest x-ray 10/09/2016 FINDINGS: Heart size and mediastinum appear stable and within normal limits. Calcified plaques in the aortic arch. Mild bibasilar opacities most likely represent subsegmental atelectasis. No significant pleural effusion visualized. No pneumothorax. IMPRESSION: Mild bibasilar likely  subsegmental atelectasis. Electronically Signed   By: DOfilia NeasM.D.   On: 09/24/2021 08:50    Assessment & Plan:   Problem List Items Addressed This Visit     ADHD (attention deficit hyperactivity disorder)    RTC 3 mo Cont on Vyvance 50 mg q am  Potential benefits of a long term stimulant use as well as potential risks  and complications were explained to the patient and were aknowledged.      Episodic paroxysmal anxiety disorder    On Pristique      Relevant Medications  desvenlafaxine (PRISTIQ) 100 MG 24 hr tablet   Hyperglycemia    Check A1c      Knee osteoarthritis    Blue-Emu cream was recommended to use 2-3 times a day       Relevant Medications   HYDROcodone-acetaminophen (NORCO) 10-325 MG tablet   Low back pain    Cont on Norco 10/325 po qid prn w/caution  Potential benefits of a long term opioids use as well as potential risks (i.e. addiction risk, apnea etc) and complications (i.e. Somnolence, constipation and others) were explained to the patient and were aknowledged.      Relevant Medications   HYDROcodone-acetaminophen (NORCO) 10-325 MG tablet   Swimmer's ear    Ear gtt         Meds ordered this encounter  Medications   VYVANSE 50 MG capsule    Sig: Take 1 capsule (50 mg total) by mouth every morning.    Dispense:  30 capsule    Refill:  0    Please fill on or after 10/06/2022   VYVANSE 50 MG capsule    Sig: Take 1 capsule (50 mg total) by mouth every morning.    Dispense:  30 capsule    Refill:  0    Please fill on or after 09/06/22   VYVANSE 50 MG capsule    Sig: Take 1 capsule (50 mg total) by mouth every morning.    Dispense:  30 capsule    Refill:  0    Please fill on or after 08/07/2022   HYDROcodone-acetaminophen (NORCO) 10-325 MG tablet    Sig: Take 0.5-1 tablets by mouth 3 (three) times daily as needed for severe pain.    Dispense:  90 tablet    Refill:  0    Please fill on or after 08/07/22   desvenlafaxine (PRISTIQ) 100  MG 24 hr tablet    Sig: Take 1 tablet (100 mg total) by mouth daily.    Dispense:  90 tablet    Refill:  3   neomycin-polymyxin-hydrocortisone (CORTISPORIN) OTIC solution    Sig: Place 3 drops into the right ear 3 (three) times daily.    Dispense:  10 mL    Refill:  1      Follow-up: Return in about 3 months (around 11/07/2022) for a follow-up visit.  Walker Kehr, MD

## 2022-08-08 ENCOUNTER — Encounter: Payer: Self-pay | Admitting: Internal Medicine

## 2022-08-08 MED ORDER — HYDROCODONE-ACETAMINOPHEN 10-325 MG PO TABS: 0.5000 | ORAL_TABLET | Freq: Three times a day (TID) | ORAL | 0 refills | Status: DC | PRN

## 2022-08-08 NOTE — Telephone Encounter (Signed)
Ok Thx 

## 2022-08-10 ENCOUNTER — Other Ambulatory Visit: Payer: Self-pay | Admitting: Internal Medicine

## 2022-08-10 DIAGNOSIS — R7989 Other specified abnormal findings of blood chemistry: Secondary | ICD-10-CM

## 2022-08-10 NOTE — Assessment & Plan Note (Signed)
elevated TSH.  Repeat free T4 and free T3 in 1 month

## 2022-09-10 ENCOUNTER — Other Ambulatory Visit: Payer: Self-pay | Admitting: Internal Medicine

## 2022-09-26 ENCOUNTER — Encounter: Payer: Self-pay | Admitting: Internal Medicine

## 2022-09-26 ENCOUNTER — Ambulatory Visit (INDEPENDENT_AMBULATORY_CARE_PROVIDER_SITE_OTHER): Payer: 59

## 2022-09-26 ENCOUNTER — Ambulatory Visit: Payer: 59 | Admitting: Internal Medicine

## 2022-09-26 ENCOUNTER — Telehealth: Payer: Self-pay | Admitting: Internal Medicine

## 2022-09-26 VITALS — BP 124/82 | HR 75 | Temp 98.4°F | Ht 64.0 in | Wt 218.0 lb

## 2022-09-26 DIAGNOSIS — W108XXA Fall (on) (from) other stairs and steps, initial encounter: Secondary | ICD-10-CM

## 2022-09-26 DIAGNOSIS — C50411 Malignant neoplasm of upper-outer quadrant of right female breast: Secondary | ICD-10-CM | POA: Diagnosis not present

## 2022-09-26 DIAGNOSIS — Z0000 Encounter for general adult medical examination without abnormal findings: Secondary | ICD-10-CM

## 2022-09-26 DIAGNOSIS — Z9889 Other specified postprocedural states: Secondary | ICD-10-CM

## 2022-09-26 DIAGNOSIS — M25552 Pain in left hip: Secondary | ICD-10-CM | POA: Diagnosis not present

## 2022-09-26 DIAGNOSIS — M79605 Pain in left leg: Secondary | ICD-10-CM

## 2022-09-26 DIAGNOSIS — R300 Dysuria: Secondary | ICD-10-CM | POA: Insufficient documentation

## 2022-09-26 LAB — URINALYSIS
Bilirubin Urine: NEGATIVE
Hgb urine dipstick: NEGATIVE
Ketones, ur: NEGATIVE
Leukocytes,Ua: NEGATIVE
Nitrite: NEGATIVE
Specific Gravity, Urine: 1.015 (ref 1.000–1.030)
Total Protein, Urine: NEGATIVE
Urine Glucose: NEGATIVE
Urobilinogen, UA: 0.2 (ref 0.0–1.0)
pH: 7 (ref 5.0–8.0)

## 2022-09-26 MED ORDER — GABAPENTIN 300 MG PO CAPS: 300.0000 mg | ORAL_CAPSULE | Freq: Two times a day (BID) | ORAL | 1 refills | Status: DC | PRN

## 2022-09-26 MED ORDER — HYDROCODONE-ACETAMINOPHEN 10-325 MG PO TABS: 1.0000 | ORAL_TABLET | Freq: Three times a day (TID) | ORAL | 0 refills | Status: DC | PRN

## 2022-09-26 MED ORDER — HYDROCODONE-ACETAMINOPHEN 10-325 MG PO TABS: 1.0000 | ORAL_TABLET | Freq: Four times a day (QID) | ORAL | 0 refills | Status: DC | PRN

## 2022-09-26 MED ORDER — METHYLPREDNISOLONE 4 MG PO TBPK: ORAL_TABLET | ORAL | 0 refills | Status: DC

## 2022-09-26 MED ORDER — CYCLOBENZAPRINE HCL 10 MG PO TABS: 10.0000 mg | ORAL_TABLET | Freq: Three times a day (TID) | ORAL | 1 refills | Status: DC | PRN

## 2022-09-26 NOTE — Assessment & Plan Note (Addendum)
L hip contusion L sciatica  Contusion: X ray - L  hip, LS spine  Hx:  Per Dr Redmond Pulling (W-F): Ms. Tamara Cross is a 56 y.o. 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 Today we discussed the efficacy of previous ESI's and patient states that she did not get much relief from most recent caudal. She would like to attempt an interlaminar ESI while considering SCS. We spoke at length regarding the SCS process and she was provided with multiple information sources to continue to research the topic. She seems very interested and will likely proceed with SCS trial in near future.  1. Chronic lumbar radiculopathy (Left L5) Case request operating room: LUMBAR SACRAL INTERLAMINAR EPIDURAL STEROID INJECTION, L5-S1  2. Failed back surgical syndrome

## 2022-09-26 NOTE — Telephone Encounter (Signed)
Patient called back and was seen today by Dr Alain Marion. She said HYDROcodone-acetaminophen (NORCO) 10-325 MG tablet was sent in but she was told its to soon to fill until 10/08/22. They said that it could be filled if it was a different dosage or they could fill something else for pain if it was sent in.

## 2022-09-26 NOTE — Telephone Encounter (Signed)
Tamara Cross  New prescription was sent to [facility].  Thanks

## 2022-09-26 NOTE — Progress Notes (Signed)
Subjective:  Patient ID: Tamara Cross, female    DOB: [birth year]  Age: 56 y.o. MRN: [id number]  CC: Fall (Had a fall and is in a lot of pain , fell on left side of hip , pain has gotten worse and can't sleep due to pain )   HPI Tamara Cross presents for L hp pain: pt fell 1 week ago off the stepping stool. C/o L hip pain and LLE pain shooting from the back to the L foot: severe pain 10/10 H/o spinal surgery - Dr Redmond Pulling [place] Out of meds today  Outpatient Medications Prior to Visit  Medication Sig Dispense Refill   acetaminophen (TYLENOL) 500 MG tablet Take 1,000 mg by mouth every 6 (six) hours as needed for headache.     albuterol (VENTOLIN HFA) 108 (90 Base) MCG/ACT inhaler Inhale 1-2 puffs into the lungs every 6 (six) hours as needed for wheezing or shortness of breath. 3 Inhaler 3   budesonide-formoterol (SYMBICORT) 160-4.5 MCG/ACT inhaler Inhale 2 puffs into the lungs 2 (two) times daily. (Patient taking differently: Inhale 2 puffs into the lungs 3 (three) times a week.) 3 Inhaler 3   cetirizine (ZYRTEC) 10 MG tablet Take 10 mg by mouth daily as needed for allergies.     desvenlafaxine (PRISTIQ) 100 MG 24 hr tablet Take 1 tablet (100 mg total) by mouth daily. 90 tablet 3   HYDROcodone-acetaminophen (NORCO) 10-325 MG tablet TAKE 1/2 TO 1 TABLET BY MOUTH 3 TIMES DAILY AS NEEDED FOR SEVERE PAIN 90 tablet 0   ibuprofen (ADVIL) 200 MG tablet Take 400 mg by mouth every 6 (six) hours as needed (Back pain).     neomycin-polymyxin-hydrocortisone (CORTISPORIN) OTIC solution Place 3 drops into the right ear 3 (three) times daily. 10 mL 1   pantoprazole (PROTONIX) 40 MG tablet Take 1 tablet (40 mg total) by mouth 2 (two) times daily. 60 tablet 5   Soft Lens Products (SENSITIVE EYES SALINE) SOLN Place 1 drop into both eyes daily. Saline eye lub     valACYclovir (VALTREX) 500 MG tablet Take 1 tablet (500 mg total) by mouth daily. Increase to bid prn with outbreak 180 tablet 3   VYVANSE 50 MG  capsule Take 1 capsule (50 mg total) by mouth every morning. 30 capsule 0   VYVANSE 50 MG capsule Take 1 capsule (50 mg total) by mouth every morning. 30 capsule 0   metoprolol succinate (TOPROL-XL) 50 MG 24 hr tablet Take 1 tablet (50 mg total) by mouth daily. Take with or immediately following a meal. (Patient not taking: Reported on 09/26/2021) 30 tablet 11   HYDROcodone-acetaminophen (NORCO) 10-325 MG tablet Take 1 tablet by mouth every 8 (eight) hours as needed. (Patient not taking: Reported on 09/26/2022) 90 tablet 0   VYVANSE 50 MG capsule Take 1 capsule (50 mg total) by mouth every morning. 30 capsule 0   Facility-Administered Medications Prior to Visit  Medication Dose Route Frequency Provider Last Rate Last Admin   gadopentetate dimeglumine (MAGNEVIST) injection 18 mL  18 mL Intravenous Once PRN Penumalli, Vikram R, MD        ROS: Review of Systems  Constitutional:  Negative for activity change, appetite change, chills, fatigue and unexpected weight change.  HENT:  Negative for congestion, mouth sores and sinus pressure.   Eyes:  Negative for visual disturbance.  Respiratory:  Negative for cough and chest tightness.   Gastrointestinal:  Negative for abdominal pain and nausea.  Genitourinary:  Negative for difficulty  urinating, frequency and vaginal pain.  Musculoskeletal:  Positive for back pain and gait problem.  Skin:  Negative for pallor and rash.  Neurological:  Negative for dizziness, tremors, weakness, numbness and headaches.  Psychiatric/Behavioral:  Negative for confusion, sleep disturbance and suicidal ideas.     Objective:  BP 124/82 (BP Location: Left Arm, Patient Position: Sitting, Cuff Size: Normal)   Pulse 75   Temp 98.4 F (36.9 C) (Oral)   Ht '5\' 4"'$  (1.626 m)   Wt 218 lb (98.9 kg)   LMP 04/11/2012 Comment: ablation  SpO2 99%   BMI 37.42 kg/m   BP Readings from Last 3 Encounters:  09/26/22 124/82  08/07/22 130/78  03/19/22 122/82    Wt Readings from  Last 3 Encounters:  09/26/22 218 lb (98.9 kg)  08/07/22 214 lb 9.6 oz (97.3 kg)  03/19/22 205 lb (93 kg)    Physical Exam Constitutional:      General: She is not in acute distress.    Appearance: She is well-developed. She is obese.  HENT:     Head: Normocephalic.     Right Ear: External ear normal.     Left Ear: External ear normal.     Nose: Nose normal.  Eyes:     General:        Right eye: No discharge.        Left eye: No discharge.     Conjunctiva/sclera: Conjunctivae normal.     Pupils: Pupils are equal, round, and reactive to light.  Neck:     Thyroid: No thyromegaly.     Vascular: No JVD.     Trachea: No tracheal deviation.  Cardiovascular:     Rate and Rhythm: Normal rate and regular rhythm.     Heart sounds: Normal heart sounds.  Pulmonary:     Effort: No respiratory distress.     Breath sounds: No stridor. No wheezing.  Abdominal:     General: Bowel sounds are normal. There is no distension.     Palpations: Abdomen is soft. There is no mass.     Tenderness: There is no abdominal tenderness. There is no guarding or rebound.  Musculoskeletal:        General: No tenderness.     Cervical back: Normal range of motion and neck supple. No rigidity.  Lymphadenopathy:     Cervical: No cervical adenopathy.  Skin:    Findings: No bruising, erythema or rash.  Neurological:     Mental Status: She is oriented to person, place, and time.     Cranial Nerves: No cranial nerve deficit.     Motor: No abnormal muscle tone.     Coordination: Coordination normal.     Gait: Gait abnormal.     Deep Tendon Reflexes: Reflexes normal.  Psychiatric:        Behavior: Behavior normal.        Thought Content: Thought content normal.        Judgment: Judgment normal.     A total time of 45 minutes was spent preparing to see the patient, reviewing tests, x-rays, operative reports and other medical records.  Also, obtaining history and performing comprehensive physical exam.   Additionally, counseling the patient regarding the above listed issues.   Finally, documenting clinical information in the health records, coordination of care, educating the patient re LBP, pain meds; working with pharmacies.    Lab Results  Component Value Date   WBC 10.1 08/07/2022   HGB 14.4 08/07/2022  HCT 42.9 08/07/2022   PLT 244.0 08/07/2022   GLUCOSE 86 08/07/2022   CHOL 224 (H) 08/07/2022   TRIG 79.0 08/07/2022   HDL 65.60 08/07/2022   LDLCALC 142 (H) 08/07/2022   ALT 19 08/07/2022   AST 17 08/07/2022   NA 140 08/07/2022   K 3.7 08/07/2022   CL 101 08/07/2022   CREATININE 0.65 08/07/2022   BUN 12 08/07/2022   CO2 32 08/07/2022   TSH 8.00 (H) 08/07/2022   HGBA1C 6.2 08/07/2022    CT Angio Chest/Abd/Pel for Dissection W and/or W/WO  Result Date: 09/24/2021 CLINICAL DATA:  Acute aortic syndrome (AAS) suspected. Right flank pain EXAM: CT ANGIOGRAPHY CHEST, ABDOMEN AND PELVIS TECHNIQUE: Non-contrast CT of the chest was initially obtained. Multidetector CT imaging through the chest, abdomen and pelvis was performed using the standard protocol during bolus administration of intravenous contrast. Multiplanar reconstructed images and MIPs were obtained and reviewed to evaluate the vascular anatomy. CONTRAST:  118m OMNIPAQUE IOHEXOL 350 MG/ML SOLN COMPARISON:  CT renal stone study 09/19/2021. FINDINGS: CTA CHEST FINDINGS Cardiovascular: Moderate coronary artery and scattered aortic calcifications. Heart is normal size. Aorta is normal caliber. No dissection. Mediastinum/Nodes: No mediastinal, hilar, or axillary adenopathy. Trachea and esophagus are unremarkable. Thyroid unremarkable. Lungs/Pleura: Linear scarring or subsegmental atelectasis in the lung bases. Lungs otherwise clear. No effusions. Musculoskeletal: Chest wall soft tissues are unremarkable. Surgical clips in the right breast. No acute bony abnormality. Review of the MIP images confirms the above findings. CTA ABDOMEN AND  PELVIS FINDINGS VASCULAR Aorta: Scattered aortic calcifications.  No aneurysm or dissection. Celiac: Patent without evidence of aneurysm, dissection, vasculitis or significant stenosis. SMA: Patent without evidence of aneurysm, dissection, vasculitis or significant stenosis. Renals: Both renal arteries are patent without evidence of aneurysm, dissection, vasculitis, fibromuscular dysplasia or significant stenosis. IMA: Patent without evidence of aneurysm, dissection, vasculitis or significant stenosis. Inflow: Scattered calcifications.  No aneurysm or dissection. Veins: No obvious venous abnormality within the limitations of this arterial phase study. Review of the MIP images confirms the above findings. NON-VASCULAR Hepatobiliary: Prior cholecystectomy. Small subcentimeter hypodensities scattered in the liver, likely small cysts, stable since prior study. Pancreas: No focal abnormality or ductal dilatation. Spleen: No focal abnormality.  Normal size. Adrenals/Urinary Tract: No adrenal abnormality. No focal renal abnormality. No stones or hydronephrosis. Urinary bladder is unremarkable. Stomach/Bowel: Stomach, large and small bowel grossly unremarkable. Lymphatic: No adenopathy Reproductive: Uterus and adnexa unremarkable.  No mass. Other: No free fluid or free air. Musculoskeletal: No acute bony abnormality. Review of the MIP images confirms the above findings. IMPRESSION: No evidence of aortic aneurysm or dissection. Scattered distal aortic calcifications. No acute cardiopulmonary disease. No acute findings in the abdomen or pelvis. Electronically Signed   By: KRolm BaptiseM.D.   On: 09/24/2021 10:12   DG Chest Port 1 View  Result Date: 09/24/2021 CLINICAL DATA:  Chest pain EXAM: PORTABLE CHEST 1 VIEW COMPARISON:  Chest x-ray 10/09/2016 FINDINGS: Heart size and mediastinum appear stable and within normal limits. Calcified plaques in the aortic arch. Mild bibasilar opacities most likely represent subsegmental  atelectasis. No significant pleural effusion visualized. No pneumothorax. IMPRESSION: Mild bibasilar likely subsegmental atelectasis. Electronically Signed   By: DOfilia NeasM.D.   On: 09/24/2021 08:50    Assessment & Plan:   Problem List Items Addressed This Visit     S/P lumbar laminectomy    Per Dr WRedmond Pulling([place]): Ms. JStevee Cross a 56y.o. female who presents for an initial consultation  of chronic left sided lumbar radiculopathy and failed back surgery syndrome. Today we discussed the efficacy of previous ESI's and patient states that she did not get much relief from most recent caudal. She would like to attempt an interlaminar ESI while considering SCS. We spoke at length regarding the SCS process and she was provided with multiple information sources to continue to research the topic. She seems very interested and will likely proceed with SCS trial in near future.  1. Chronic lumbar radiculopathy (Left L5) Case request operating room: LUMBAR SACRAL INTERLAMINAR EPIDURAL STEROID INJECTION, L5-S1  2. 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 Today we discussed the efficacy of previous ESI's and patient states that she did not get much relief from most recent caudal. She would like to attempt an interlaminar ESI while considering SCS. We spoke at length regarding the SCS process and she was provided with multiple information sources to continue to research the topic. She seems very interested and  will likely proceed with SCS trial in near future.  1. Chronic lumbar radiculopathy (Left L5) Case request operating room: LUMBAR SACRAL INTERLAMINAR EPIDURAL STEROID INJECTION, L5-S1  2. Failed back surgical syndrome       Relevant Orders   DG Lumbar Spine 2-3 Views   XR HIP UNILAT W OR W/O PELVIS 2-3 VIEWS LEFT   Fall (on) (from) other stairs and steps, initial encounter - Primary    L hip contusion L sciatica  Contusion: X ray - L  hip, LS spine  Hx:  Per Dr Redmond Pulling ([facility]): Tamara Cross is a 56 y.o. 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 Today we discussed the efficacy of previous ESI's and patient states that she did not get much relief from most recent caudal. She would like to attempt an interlaminar ESI while considering SCS. We spoke at length regarding the SCS process and she was provided with multiple information sources to continue to research the topic. She seems very interested and will likely proceed with SCS trial in near future.  1. Chronic lumbar radiculopathy (Left L5) Case request operating room: LUMBAR SACRAL INTERLAMINAR EPIDURAL STEROID INJECTION, L5-S1  2. Failed back surgical syndrome         Relevant Orders   DG Lumbar Spine 2-3 Views   XR HIP UNILAT W OR W/O PELVIS 2-3 VIEWS LEFT   Urinalysis      Meds ordered this encounter  Medications   HYDROcodone-acetaminophen (NORCO) 10-325 MG tablet    Sig: Take 1 tablet by mouth every 8 (eight) hours as needed.    Dispense:  90 tablet    Refill:  0   gabapentin (NEURONTIN) 300 MG capsule    Sig: Take 1 capsule (300 mg total) by mouth 2 (two) times daily as needed.    Dispense:  60 capsule    Refill:  1   methylPREDNISolone (MEDROL DOSEPAK) 4 MG TBPK tablet    Sig: As directed    Dispense:  21 tablet    Refill:  0   cyclobenzaprine (FLEXERIL) 10 MG tablet    Sig: Take 1 tablet (10 mg total) by mouth 3 (three) times  daily as needed for muscle spasms.    Dispense:  90 tablet    Refill:  1      Follow-up: Return in about 2 weeks (around 10/10/2022) for a follow-up visit.  Walker Kehr, MD

## 2022-09-26 NOTE — Assessment & Plan Note (Signed)
Per Dr Redmond Pulling ([facility]): Ms. Tamara Cross is a 56 y.o. 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 Today we discussed the efficacy of previous ESI's and patient states that she did not get much relief from most recent caudal. She would like to attempt an interlaminar ESI while considering SCS. We spoke at length regarding the SCS process and she was provided with multiple information sources to continue to research the topic. She seems very interested and will likely proceed with SCS trial in near future.  1. Chronic lumbar radiculopathy (Left L5) Case request operating room: LUMBAR SACRAL INTERLAMINAR EPIDURAL STEROID INJECTION, L5-S1  2. Failed back surgical syndrome    S/p L5-S1 ILESI in 2020

## 2022-09-26 NOTE — Assessment & Plan Note (Signed)
L hip contusion L sciatica  Contusion: X ray - L  hip, LS spine  Hx:  Per Dr Redmond Pulling ([facility]): Ms. Tamara Cross is a 56 y.o. 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 Today we discussed the efficacy of previous ESI's and patient states that she did not get much relief from most recent caudal. She would like to attempt an interlaminar ESI while considering SCS. We spoke at length regarding the SCS process and she was provided with multiple information sources to continue to research the topic. She seems very interested and will likely proceed with SCS trial in near future.  1. Chronic lumbar radiculopathy (Left L5) Case request operating room: LUMBAR SACRAL INTERLAMINAR EPIDURAL STEROID INJECTION, L5-S1  2. Failed back surgical syndrome

## 2022-09-27 ENCOUNTER — Encounter: Payer: Self-pay | Admitting: Internal Medicine

## 2022-10-02 ENCOUNTER — Encounter: Payer: Self-pay | Admitting: Internal Medicine

## 2022-10-04 ENCOUNTER — Other Ambulatory Visit: Payer: Self-pay | Admitting: Internal Medicine

## 2022-10-04 DIAGNOSIS — M5430 Sciatica, unspecified side: Secondary | ICD-10-CM

## 2022-10-04 DIAGNOSIS — W108XXA Fall (on) (from) other stairs and steps, initial encounter: Secondary | ICD-10-CM

## 2022-10-04 DIAGNOSIS — M79605 Pain in left leg: Secondary | ICD-10-CM

## 2022-10-04 NOTE — Progress Notes (Signed)
MRI

## 2022-10-21 ENCOUNTER — Encounter: Payer: Self-pay | Admitting: Internal Medicine

## 2022-10-21 NOTE — Telephone Encounter (Signed)
Pls advise on email for meds.Marland KitchenJohny Chess

## 2022-10-22 ENCOUNTER — Other Ambulatory Visit: Payer: Self-pay | Admitting: Internal Medicine

## 2022-10-22 MED ORDER — HYDROCODONE-ACETAMINOPHEN 10-325 MG PO TABS: 1.0000 | ORAL_TABLET | Freq: Three times a day (TID) | ORAL | 0 refills | Status: DC | PRN

## 2022-10-22 MED ORDER — CYCLOBENZAPRINE HCL 10 MG PO TABS: 10.0000 mg | ORAL_TABLET | Freq: Three times a day (TID) | ORAL | 1 refills | Status: DC | PRN

## 2022-10-22 MED ORDER — PREDNISONE 10 MG PO TABS: 10.0000 mg | ORAL_TABLET | Freq: Every day | ORAL | 0 refills | Status: DC

## 2022-10-25 ENCOUNTER — Ambulatory Visit
Admission: RE | Admit: 2022-10-25 | Discharge: 2022-10-25 | Disposition: A | Discharge status: Home / Self Care | Payer: 59 | Source: Ambulatory Visit | Attending: Internal Medicine | Admitting: Internal Medicine

## 2022-10-25 DIAGNOSIS — M5430 Sciatica, unspecified side: Secondary | ICD-10-CM

## 2022-10-25 DIAGNOSIS — M79605 Pain in left leg: Secondary | ICD-10-CM

## 2022-10-25 DIAGNOSIS — W108XXA Fall (on) (from) other stairs and steps, initial encounter: Secondary | ICD-10-CM

## 2022-10-26 ENCOUNTER — Encounter: Payer: Self-pay | Admitting: Internal Medicine

## 2022-10-28 ENCOUNTER — Encounter: Payer: Self-pay | Admitting: Internal Medicine

## 2022-10-28 NOTE — Telephone Encounter (Signed)
Patient called to follow up on HYDROcodone-acetaminophen (Cotati) 10-325 MG tablet and said this needs to go to [facility] and that it needs to be canceled at [facility]. Please advise. She said she is in a lot of pain and asked if this can be done as soon as possible

## 2022-10-29 ENCOUNTER — Telehealth: Payer: Self-pay | Admitting: Internal Medicine

## 2022-10-29 ENCOUNTER — Other Ambulatory Visit: Payer: Self-pay | Admitting: Internal Medicine

## 2022-10-29 MED ORDER — HYDROCODONE-ACETAMINOPHEN 10-325 MG PO TABS: 1.0000 | ORAL_TABLET | Freq: Three times a day (TID) | ORAL | 0 refills | Status: DC | PRN

## 2022-10-29 NOTE — Telephone Encounter (Signed)
Patient called wanting an update on the referral for the neuro-surgeon. She would like the recommendation to be in the mind frame of who would Dr. Alain Marion have do the surgery on him if he were her. She states she has a hard time cleaning after herself, so she would like to have this taken care of as soon as possible. She would like a callback at [phone].

## 2022-10-29 NOTE — Telephone Encounter (Signed)
Referral request sent to provider and awaiting provider response.

## 2022-10-30 ENCOUNTER — Other Ambulatory Visit: Payer: Self-pay | Admitting: Internal Medicine

## 2022-10-30 DIAGNOSIS — M5416 Radiculopathy, lumbar region: Secondary | ICD-10-CM

## 2022-11-08 ENCOUNTER — Encounter: Payer: Self-pay | Admitting: Internal Medicine

## 2022-11-12 ENCOUNTER — Other Ambulatory Visit: Payer: Self-pay | Admitting: Internal Medicine

## 2022-11-12 DIAGNOSIS — M5116 Intervertebral disc disorders with radiculopathy, lumbar region: Secondary | ICD-10-CM

## 2022-11-12 DIAGNOSIS — M5430 Sciatica, unspecified side: Secondary | ICD-10-CM

## 2022-11-13 ENCOUNTER — Other Ambulatory Visit: Payer: Self-pay | Admitting: Internal Medicine

## 2022-11-13 ENCOUNTER — Encounter: Payer: Self-pay | Admitting: Internal Medicine

## 2022-11-14 ENCOUNTER — Encounter: Payer: Self-pay | Admitting: Internal Medicine

## 2022-11-14 ENCOUNTER — Other Ambulatory Visit: Payer: Self-pay | Admitting: Internal Medicine

## 2022-11-14 MED ORDER — PREDNISONE 10 MG PO TABS: 10.0000 mg | ORAL_TABLET | Freq: Every day | ORAL | 0 refills | Status: DC

## 2022-11-14 MED ORDER — METOPROLOL SUCCINATE ER 50 MG PO TB24: 50.0000 mg | ORAL_TABLET | Freq: Every day | ORAL | 11 refills | Status: DC

## 2022-11-14 MED ORDER — GABAPENTIN 300 MG PO CAPS: 300.0000 mg | ORAL_CAPSULE | Freq: Two times a day (BID) | ORAL | 1 refills | Status: DC | PRN

## 2022-11-14 MED ORDER — HYDROCODONE-ACETAMINOPHEN 10-325 MG PO TABS: 1.0000 | ORAL_TABLET | Freq: Three times a day (TID) | ORAL | 0 refills | Status: DC | PRN

## 2022-11-14 MED ORDER — VYVANSE 50 MG PO CAPS: 50.0000 mg | ORAL_CAPSULE | ORAL | 0 refills | Status: DC

## 2022-11-17 ENCOUNTER — Other Ambulatory Visit: Payer: Self-pay | Admitting: Internal Medicine

## 2022-11-17 MED ORDER — HYDROCODONE-ACETAMINOPHEN 10-325 MG PO TABS: 1.0000 | ORAL_TABLET | Freq: Three times a day (TID) | ORAL | 0 refills | Status: DC | PRN

## 2022-11-25 ENCOUNTER — Other Ambulatory Visit: Payer: Self-pay | Admitting: Orthopedic Surgery

## 2022-11-26 ENCOUNTER — Telehealth: Payer: Self-pay | Admitting: Internal Medicine

## 2022-11-26 ENCOUNTER — Ambulatory Visit: Payer: 59 | Admitting: Internal Medicine

## 2022-11-26 NOTE — Telephone Encounter (Signed)
Patient had an appointment today and did not make it.  She needs to be seen for medication management.  Can you work her in - if so please send to [facility] to be put on the schedule

## 2022-11-28 NOTE — Telephone Encounter (Signed)
OK 9:20 tomorrow Thx

## 2022-11-28 NOTE — Telephone Encounter (Signed)
Notified pt inform can see MD tomorrow @ 9:20...Tamara Cross

## 2022-11-29 ENCOUNTER — Ambulatory Visit: Payer: 59 | Admitting: Internal Medicine

## 2022-11-29 ENCOUNTER — Encounter: Payer: Self-pay | Admitting: Internal Medicine

## 2022-11-29 VITALS — BP 132/80 | HR 95 | Temp 97.7°F | Ht 64.0 in | Wt 225.0 lb

## 2022-11-29 DIAGNOSIS — E6609 Other obesity due to excess calories: Secondary | ICD-10-CM

## 2022-11-29 DIAGNOSIS — Z6832 Body mass index (BMI) 32.0-32.9, adult: Secondary | ICD-10-CM

## 2022-11-29 DIAGNOSIS — F909 Attention-deficit hyperactivity disorder, unspecified type: Secondary | ICD-10-CM | POA: Diagnosis not present

## 2022-11-29 DIAGNOSIS — R7989 Other specified abnormal findings of blood chemistry: Secondary | ICD-10-CM

## 2022-11-29 DIAGNOSIS — M5440 Lumbago with sciatica, unspecified side: Secondary | ICD-10-CM

## 2022-11-29 DIAGNOSIS — F3341 Major depressive disorder, recurrent, in partial remission: Secondary | ICD-10-CM

## 2022-11-29 DIAGNOSIS — G8929 Other chronic pain: Secondary | ICD-10-CM

## 2022-11-29 LAB — T4, FREE: Free T4: 0.7 ng/dL (ref 0.60–1.60)

## 2022-11-29 LAB — TSH: TSH: 4.86 u[IU]/mL (ref 0.35–5.50)

## 2022-11-29 LAB — T3, FREE: T3, Free: 4.5 pg/mL — ABNORMAL HIGH (ref 2.3–4.2)

## 2022-11-29 MED ORDER — PREDNISONE 10 MG PO TABS: 10.0000 mg | ORAL_TABLET | Freq: Every day | ORAL | 0 refills | Status: DC

## 2022-11-29 MED ORDER — DESVENLAFAXINE SUCCINATE ER 100 MG PO TB24: 100.0000 mg | ORAL_TABLET | Freq: Every day | ORAL | 3 refills | Status: DC

## 2022-11-29 MED ORDER — HYDROCODONE-ACETAMINOPHEN 10-325 MG PO TABS: 1.0000 | ORAL_TABLET | Freq: Four times a day (QID) | ORAL | 0 refills | Status: DC | PRN

## 2022-11-29 NOTE — Patient Instructions (Signed)
Take Vitamin D3 + K2

## 2022-11-29 NOTE — Assessment & Plan Note (Signed)
Stop drinking Coke!

## 2022-11-29 NOTE — Assessment & Plan Note (Signed)
Check FT4, FT3

## 2022-11-29 NOTE — Progress Notes (Signed)
Subjective:  Patient ID: Tamara Cross, female    DOB: [birth year]  Age: 57 y.o. MRN: [id number]  CC: Medication Management (Pain medication refill)   HPI BRYLIN BALOUN presents for severe LBP - excruciating pain w/activities Seeing Dr Lynann Bologna: L4-5, L5-S1 is planned on 01/15/23 Steroids help a lot  Not taking Vit D C/o wt gain - drinking Coke up to 2 liter/d... F/u depression, abnormal TSH   Outpatient Medications Prior to Visit  Medication Sig Dispense Refill   acetaminophen (TYLENOL) 500 MG tablet Take 1,000 mg by mouth every 6 (six) hours as needed for headache.     albuterol (VENTOLIN HFA) 108 (90 Base) MCG/ACT inhaler Inhale 1-2 puffs into the lungs every 6 (six) hours as needed for wheezing or shortness of breath. 3 Inhaler 3   budesonide-formoterol (SYMBICORT) 160-4.5 MCG/ACT inhaler Inhale 2 puffs into the lungs 2 (two) times daily. (Patient taking differently: Inhale 2 puffs into the lungs 3 (three) times a week.) 3 Inhaler 3   cetirizine (ZYRTEC) 10 MG tablet Take 10 mg by mouth daily as needed for allergies.     cyclobenzaprine (FLEXERIL) 10 MG tablet Take 1 tablet (10 mg total) by mouth 3 (three) times daily as needed for muscle spasms. 90 tablet 1   gabapentin (NEURONTIN) 300 MG capsule Take 1 capsule (300 mg total) by mouth 2 (two) times daily as needed. 60 capsule 1   ibuprofen (ADVIL) 200 MG tablet Take 400 mg by mouth every 6 (six) hours as needed (Back pain).     metoprolol succinate (TOPROL-XL) 50 MG 24 hr tablet Take 1 tablet (50 mg total) by mouth daily. Take with or immediately following a meal. 30 tablet 11   pantoprazole (PROTONIX) 40 MG tablet Take 1 tablet (40 mg total) by mouth 2 (two) times daily. 60 tablet 5   Soft Lens Products (SENSITIVE EYES SALINE) SOLN Place 1 drop into both eyes daily. Saline eye lub     valACYclovir (VALTREX) 500 MG tablet Take 1 tablet (500 mg total) by mouth daily. Increase to bid prn with outbreak 180 tablet 3   VYVANSE 50 MG  capsule Take 1 capsule (50 mg total) by mouth every morning. 30 capsule 0   VYVANSE 50 MG capsule Take 1 capsule (50 mg total) by mouth every morning. 30 capsule 0   desvenlafaxine (PRISTIQ) 100 MG 24 hr tablet Take 1 tablet (100 mg total) by mouth daily. 90 tablet 3   HYDROcodone-acetaminophen (NORCO) 10-325 MG tablet Take 1 tablet by mouth every 8 (eight) hours as needed. 90 tablet 0   predniSONE (DELTASONE) 10 MG tablet Take 1 tablet (10 mg total) by mouth daily with breakfast. Take pc. 30 tablet 0   Facility-Administered Medications Prior to Visit  Medication Dose Route Frequency Provider Last Rate Last Admin   gadopentetate dimeglumine (MAGNEVIST) injection 18 mL  18 mL Intravenous Once PRN Penumalli, Vikram R, MD        ROS: Review of Systems  Constitutional:  Positive for fatigue and unexpected weight change. Negative for activity change, appetite change and chills.  HENT:  Negative for congestion, mouth sores and sinus pressure.   Eyes:  Negative for visual disturbance.  Respiratory:  Negative for cough and chest tightness.   Gastrointestinal:  Negative for abdominal pain and nausea.  Genitourinary:  Negative for difficulty urinating, frequency and vaginal pain.  Musculoskeletal:  Positive for back pain and gait problem.  Skin:  Negative for pallor and rash.  Neurological:  Negative  for dizziness, tremors, weakness, numbness and headaches.  Psychiatric/Behavioral:  Negative for confusion and sleep disturbance. The patient is not nervous/anxious.     Objective:  BP 132/80 (BP Location: Left Arm, Patient Position: Sitting, Cuff Size: Large)   Pulse 95   Temp 97.7 F (36.5 C) (Temporal)   Ht '5\' 4"'$  (1.626 m)   Wt 225 lb (102.1 kg)   LMP 04/11/2012 Comment: ablation  SpO2 98%   BMI 38.62 kg/m   BP Readings from Last 3 Encounters:  11/29/22 132/80  09/26/22 124/82  08/07/22 130/78    Wt Readings from Last 3 Encounters:  11/29/22 225 lb (102.1 kg)  09/26/22 218 lb (98.9  kg)  08/07/22 214 lb 9.6 oz (97.3 kg)    Physical Exam Constitutional:      General: She is not in acute distress.    Appearance: She is well-developed. She is obese.  HENT:     Head: Normocephalic.     Right Ear: External ear normal.     Left Ear: External ear normal.     Nose: Nose normal.  Eyes:     General:        Right eye: No discharge.        Left eye: No discharge.     Conjunctiva/sclera: Conjunctivae normal.     Pupils: Pupils are equal, round, and reactive to light.  Neck:     Thyroid: No thyromegaly.     Vascular: No JVD.     Trachea: No tracheal deviation.  Cardiovascular:     Rate and Rhythm: Normal rate and regular rhythm.     Heart sounds: Normal heart sounds.  Pulmonary:     Effort: No respiratory distress.     Breath sounds: No stridor. No wheezing.  Abdominal:     General: Bowel sounds are normal. There is no distension.     Palpations: Abdomen is soft. There is no mass.     Tenderness: There is no abdominal tenderness. There is no guarding or rebound.  Musculoskeletal:        General: Tenderness present.     Cervical back: Normal range of motion and neck supple. No rigidity.  Lymphadenopathy:     Cervical: No cervical adenopathy.  Skin:    Findings: No erythema or rash.  Neurological:     Mental Status: She is oriented to person, place, and time.     Cranial Nerves: No cranial nerve deficit.     Motor: No abnormal muscle tone.     Coordination: Coordination normal.     Gait: Gait abnormal.     Deep Tendon Reflexes: Reflexes normal.  Psychiatric:        Behavior: Behavior normal.        Thought Content: Thought content normal.        Judgment: Judgment normal.    LS w/pain - very uncomfortable.... Antalgic gait; using a cane     A total time of 45 minutes was spent preparing to see the patient, reviewing tests, x-rays, operative reports and other medical records.  Also, obtaining history and performing comprehensive physical exam.   Additionally, counseling the patient regarding the above listed issues - opioid use, LBP, steroid use, wt loss.   Finally, documenting clinical information in the health records, coordination of care, educating the patient. It is a complex case.   Lab Results  Component Value Date   WBC 10.1 08/07/2022   HGB 14.4 08/07/2022   HCT 42.9 08/07/2022   PLT 244.0  08/07/2022   GLUCOSE 86 08/07/2022   CHOL 224 (H) 08/07/2022   TRIG 79.0 08/07/2022   HDL 65.60 08/07/2022   LDLCALC 142 (H) 08/07/2022   ALT 19 08/07/2022   AST 17 08/07/2022   NA 140 08/07/2022   K 3.7 08/07/2022   CL 101 08/07/2022   CREATININE 0.65 08/07/2022   BUN 12 08/07/2022   CO2 32 08/07/2022   TSH 8.00 (H) 08/07/2022   HGBA1C 6.2 08/07/2022    MR Lumbar Spine Wo Contrast  Result Date: 10/26/2022 CLINICAL DATA:  Back trauma with abnormal neuro exam, CT or x-ray positive Pain in the left lower extremity for 4-6 weeks. Numbness on the left side. EXAM: MRI LUMBAR SPINE WITHOUT CONTRAST TECHNIQUE: Multiplanar, multisequence MR imaging of the lumbar spine was performed. No intravenous contrast was administered. COMPARISON:  11/11/2017 FINDINGS: Segmentation:  Standard. Alignment:  Mild degenerative anterolisthesis at L3-4 Vertebrae:  No fracture, evidence of discitis, or bone lesion. Conus medullaris and cauda equina: Conus extends to the L1 level. Conus and cauda equina appear normal. Paraspinal and other soft tissues: Postoperative scarring involving lower posterior soft tissues. Disc levels: T12- L1: Unremarkable. L1-L2: Mild disc height loss and bulging. L2-L3: Minor disc bulging L3-L4: Degenerative facet spurring with mild anterolisthesis since prior. Small upward pointing disc protrusion. L4-L5: Disc narrowing and bulging with left paracentral herniation that is new or increased and contacts the descending left L5 nerve root. Prior left subarticular recess decompression. Disc height loss and spurring causes noncompressive  left foraminal narrowing L5-S1:Disc narrowing and bulging with left paracentral herniation based on sagittal T2 weighted imaging, impinging on the left S1 nerve root. IMPRESSION: 1. 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 There has been prior decompression at the left subarticular recess of both levels, but discrete and new herniations are suggested when compared to 2019. 2. L3-4 facet degeneration with interval anterolisthesis. Electronically Signed   By: Jorje Guild M.D.   On: 10/26/2022 12:00    Assessment & Plan:   Problem List Items Addressed This Visit       Other   Obesity    Stop drinking Coke!      Low back pain - Primary    Seeing Dr Lynann Bologna: L4-5, L5-S1 is planned on 01/15/23 Steroids help a lot   Potential benefits of a short/long term steroid  use as well as potential risks  and complications were explained to the patient and were aknowledged.  Severe pain. Increase Norco to qid  Potential benefits of a short/long term opioids use as well as potential risks (i.e. addiction risk, apnea etc) and complications (i.e. Somnolence, constipation and others) were explained to the patient and were aknowledged.   D/c Prednisone 7-10 d before surgery       Relevant Medications   HYDROcodone-acetaminophen (NORCO) 10-325 MG tablet   predniSONE (DELTASONE) 10 MG tablet   Depression    Chronic symptoms.   Continue with Pristiq 100 mg daily.      Relevant Medications   desvenlafaxine (PRISTIQ) 100 MG 24 hr tablet   ADHD (attention deficit hyperactivity disorder)    On Rx -Vyvance  Potential benefits of a long term stimulant use as well as potential risks  and complications were explained to the patient and were aknowledged.      Abnormal TSH    Check FT4, FT3         Meds ordered this encounter  Medications   HYDROcodone-acetaminophen (  NORCO) 10-325 MG tablet    Sig: Take 1 tablet by mouth every 6 (six)  hours as needed for severe pain.    Dispense:  120 tablet    Refill:  0   predniSONE (DELTASONE) 10 MG tablet    Sig: Take 1 tablet (10 mg total) by mouth daily with breakfast. Take pc.    Dispense:  30 tablet    Refill:  0   desvenlafaxine (PRISTIQ) 100 MG 24 hr tablet    Sig: Take 1 tablet (100 mg total) by mouth daily.    Dispense:  90 tablet    Refill:  3      Follow-up: Return in about 4 weeks (around 12/27/2022) for a follow-up visit.  Walker Kehr, MD

## 2022-11-29 NOTE — Assessment & Plan Note (Signed)
On Rx -Vyvance  Potential benefits of a long term stimulant use as well as potential risks  and complications were explained to the patient and were aknowledged.

## 2022-11-29 NOTE — Assessment & Plan Note (Signed)
Chronic symptoms.   Continue with Pristiq 100 mg daily.

## 2022-11-29 NOTE — Assessment & Plan Note (Addendum)
Seeing Dr Lynann Bologna: L4-5, L5-S1 is planned on 01/15/23 Steroids help a lot   Potential benefits of a short/long term steroid  use as well as potential risks  and complications were explained to the patient and were aknowledged.  Severe pain. Increase Norco to qid  Potential benefits of a short/long term opioids use as well as potential risks (i.e. addiction risk, apnea etc) and complications (i.e. Somnolence, constipation and others) were explained to the patient and were aknowledged.   D/c Prednisone 7-10 d before surgery

## 2022-12-04 ENCOUNTER — Other Ambulatory Visit: Payer: Self-pay | Admitting: Internal Medicine

## 2022-12-04 DIAGNOSIS — R7989 Other specified abnormal findings of blood chemistry: Secondary | ICD-10-CM

## 2023-01-06 ENCOUNTER — Other Ambulatory Visit: Payer: Self-pay | Admitting: Internal Medicine

## 2023-01-06 ENCOUNTER — Encounter: Payer: Self-pay | Admitting: Internal Medicine

## 2023-01-09 ENCOUNTER — Other Ambulatory Visit: Payer: Self-pay | Admitting: Internal Medicine

## 2023-01-09 MED ORDER — HYDROCODONE-ACETAMINOPHEN 10-325 MG PO TABS: 1.0000 | ORAL_TABLET | Freq: Four times a day (QID) | ORAL | 0 refills | Status: DC | PRN

## 2023-01-09 MED ORDER — CYCLOBENZAPRINE HCL 10 MG PO TABS: 10.0000 mg | ORAL_TABLET | Freq: Three times a day (TID) | ORAL | 1 refills | Status: DC | PRN

## 2023-01-09 MED ORDER — GABAPENTIN 300 MG PO CAPS: 300.0000 mg | ORAL_CAPSULE | Freq: Two times a day (BID) | ORAL | 1 refills | Status: DC | PRN

## 2023-01-13 ENCOUNTER — Telehealth: Payer: Self-pay | Admitting: Internal Medicine

## 2023-01-13 NOTE — Telephone Encounter (Signed)
Called [facility] spoke w/ [facility]. Gave diagnosis for  the Hydrocodone.Marland KitchenRaechel Cross Chronic midline low back pain with sciatica, sciatica laterality unspecified  - Primary M54.40, G89.29

## 2023-01-13 NOTE — Telephone Encounter (Signed)
Patient's pharmacy called requesting the reason for her prescription HYDROcodone-acetaminophen (NORCO) 10-325 MG tablet . Pharmacist's name is Casimiro Needle and he would like a call back at [phone].

## 2023-01-15 ENCOUNTER — Ambulatory Visit: Admit: 2023-01-15 | Payer: 59 | Admitting: Orthopedic Surgery

## 2023-01-15 ENCOUNTER — Other Ambulatory Visit: Payer: Self-pay | Admitting: Internal Medicine

## 2023-01-15 SURGERY — TRANSFORAMINAL LUMBAR INTERBODY FUSION (TLIF) WITH PEDICLE SCREW FIXATION 2 LEVEL
Anesthesia: General | Laterality: Left

## 2023-01-16 ENCOUNTER — Ambulatory Visit: Payer: 59 | Admitting: Internal Medicine

## 2023-02-05 ENCOUNTER — Ambulatory Visit: Payer: 59 | Admitting: Internal Medicine

## 2023-02-18 ENCOUNTER — Other Ambulatory Visit: Payer: Self-pay | Admitting: Internal Medicine

## 2023-02-18 ENCOUNTER — Encounter: Payer: Self-pay | Admitting: Internal Medicine

## 2023-02-18 MED ORDER — VALACYCLOVIR HCL 500 MG PO TABS: 500.0000 mg | ORAL_TABLET | Freq: Every day | ORAL | 0 refills | Status: DC

## 2023-02-18 NOTE — Telephone Encounter (Unsigned)
Sent refill for Gabapentin and Valtrex. Pls advise Hydrocodone.Marland KitchenRaechel Chute

## 2023-02-20 ENCOUNTER — Encounter: Payer: Self-pay | Admitting: Internal Medicine

## 2023-02-21 ENCOUNTER — Other Ambulatory Visit: Payer: Self-pay | Admitting: Internal Medicine

## 2023-02-21 MED ORDER — HYDROCODONE-ACETAMINOPHEN 10-325 MG PO TABS: 1.0000 | ORAL_TABLET | Freq: Four times a day (QID) | ORAL | 0 refills | Status: DC | PRN

## 2023-03-11 ENCOUNTER — Encounter: Payer: Self-pay | Admitting: Internal Medicine

## 2023-03-11 ENCOUNTER — Ambulatory Visit: Payer: 59 | Admitting: Internal Medicine

## 2023-03-11 VITALS — BP 110/70 | HR 111 | Temp 98.8°F | Ht 64.0 in | Wt 223.0 lb

## 2023-03-11 DIAGNOSIS — F3341 Major depressive disorder, recurrent, in partial remission: Secondary | ICD-10-CM

## 2023-03-11 DIAGNOSIS — G8929 Other chronic pain: Secondary | ICD-10-CM

## 2023-03-11 DIAGNOSIS — E6609 Other obesity due to excess calories: Secondary | ICD-10-CM

## 2023-03-11 DIAGNOSIS — F909 Attention-deficit hyperactivity disorder, unspecified type: Secondary | ICD-10-CM

## 2023-03-11 DIAGNOSIS — Z6832 Body mass index (BMI) 32.0-32.9, adult: Secondary | ICD-10-CM

## 2023-03-11 DIAGNOSIS — M5440 Lumbago with sciatica, unspecified side: Secondary | ICD-10-CM

## 2023-03-11 DIAGNOSIS — M2550 Pain in unspecified joint: Secondary | ICD-10-CM

## 2023-03-11 DIAGNOSIS — F17200 Nicotine dependence, unspecified, uncomplicated: Secondary | ICD-10-CM

## 2023-03-11 MED ORDER — VYVANSE 50 MG PO CAPS: 50.0000 mg | ORAL_CAPSULE | ORAL | 0 refills | Status: DC

## 2023-03-11 MED ORDER — CLOTRIMAZOLE-BETAMETHASONE 1-0.05 % EX CREA: 1.0000 | TOPICAL_CREAM | Freq: Every day | CUTANEOUS | 1 refills | Status: DC

## 2023-03-11 MED ORDER — HYDROCODONE-ACETAMINOPHEN 10-325 MG PO TABS: 1.0000 | ORAL_TABLET | Freq: Three times a day (TID) | ORAL | 0 refills | Status: DC | PRN

## 2023-03-11 MED ORDER — LISDEXAMFETAMINE DIMESYLATE 50 MG PO CAPS: 50.0000 mg | ORAL_CAPSULE | Freq: Every day | ORAL | 0 refills | Status: DC

## 2023-03-11 MED ORDER — VALACYCLOVIR HCL 500 MG PO TABS: 500.0000 mg | ORAL_TABLET | Freq: Every day | ORAL | 0 refills | Status: DC

## 2023-03-11 MED ORDER — HYDROCODONE-ACETAMINOPHEN 10-325 MG PO TABS: 1.0000 | ORAL_TABLET | Freq: Four times a day (QID) | ORAL | 0 refills | Status: DC | PRN

## 2023-03-11 MED ORDER — GABAPENTIN 300 MG PO CAPS: ORAL_CAPSULE | ORAL | 1 refills | Status: DC

## 2023-03-11 MED ORDER — HYDROCODONE-ACETAMINOPHEN 10-325 MG PO TABS: 1.0000 | ORAL_TABLET | Freq: Three times a day (TID) | ORAL | 0 refills | Status: AC | PRN

## 2023-03-11 NOTE — Assessment & Plan Note (Signed)
Smoking less: on 3 packs/wk from 1.5 PPD

## 2023-03-11 NOTE — Assessment & Plan Note (Signed)
Smoking less: on 3 packs/wk from 1.5 PPD 

## 2023-03-11 NOTE — Assessment & Plan Note (Signed)
Wt Readings from Last 3 Encounters:  03/11/23 223 lb (101.2 kg)  11/29/22 225 lb (102.1 kg)  09/26/22 218 lb (98.9 kg)

## 2023-03-11 NOTE — Assessment & Plan Note (Signed)
Chronic symptoms.  Continue with Pristiq 100 mg daily. 

## 2023-03-11 NOTE — Assessment & Plan Note (Signed)
Chronic. 

## 2023-03-11 NOTE — Assessment & Plan Note (Signed)
On Pristique 

## 2023-03-11 NOTE — Progress Notes (Signed)
Subjective:  Patient ID: Tamara Cross, female    DOB: [birth year]  Age: 57 y.o. MRN: [id number]  CC: Follow-up   HPI Tamara Cross presents for LBP, tobacco use, ataxia Smoking less: on 3 packs/wk from 1.5 PPD Drinking Coke zero Seeing Dr Yevette Edwards   Outpatient Medications Prior to Visit  Medication Sig Dispense Refill   acetaminophen (TYLENOL) 500 MG tablet Take 1,000 mg by mouth every 6 (six) hours as needed for headache.     albuterol (VENTOLIN HFA) 108 (90 Base) MCG/ACT inhaler Inhale 1-2 puffs into the lungs every 6 (six) hours as needed for wheezing or shortness of breath. 3 Inhaler 3   budesonide-formoterol (SYMBICORT) 160-4.5 MCG/ACT inhaler Inhale 2 puffs into the lungs 2 (two) times daily. (Patient taking differently: Inhale 2 puffs into the lungs 3 (three) times a week.) 3 Inhaler 3   cetirizine (ZYRTEC) 10 MG tablet Take 10 mg by mouth daily as needed for allergies.     cyclobenzaprine (FLEXERIL) 10 MG tablet Take 1 tablet (10 mg total) by mouth 3 (three) times daily as needed for muscle spasms. 90 tablet 1   desvenlafaxine (PRISTIQ) 100 MG 24 hr tablet Take 1 tablet (100 mg total) by mouth daily. 90 tablet 3   ibuprofen (ADVIL) 200 MG tablet Take 400 mg by mouth every 6 (six) hours as needed (Back pain).     metoprolol succinate (TOPROL-XL) 50 MG 24 hr tablet Take 1 tablet (50 mg total) by mouth daily. Take with or immediately following a meal. 30 tablet 11   pantoprazole (PROTONIX) 40 MG tablet Take 1 tablet (40 mg total) by mouth 2 (two) times daily. 60 tablet 5   predniSONE (DELTASONE) 10 MG tablet Take 1 tablet (10 mg total) by mouth daily with breakfast. Take pc. 30 tablet 0   Soft Lens Products (SENSITIVE EYES SALINE) SOLN Place 1 drop into both eyes daily. Saline eye lub     gabapentin (NEURONTIN) 300 MG capsule TAKE 1 CAPSULE(300 MG) BY MOUTH TWICE DAILY AS NEEDED 60 capsule 1   HYDROcodone-acetaminophen (NORCO) 10-325 MG tablet Take 1 tablet by mouth every 6 (six)  hours as needed for severe pain. Please fill on or after 02/21/23 60 tablet 0   valACYclovir (VALTREX) 500 MG tablet Take 1 tablet (500 mg total) by mouth daily. Increase to bid prn with outbreak 180 tablet 0   VYVANSE 50 MG capsule Take 1 capsule (50 mg total) by mouth every morning. 30 capsule 0   VYVANSE 50 MG capsule Take 1 capsule (50 mg total) by mouth every morning. 30 capsule 0   Facility-Administered Medications Prior to Visit  Medication Dose Route Frequency Provider Last Rate Last Admin   gadopentetate dimeglumine (MAGNEVIST) injection 18 mL  18 mL Intravenous Once PRN Penumalli, Vikram R, MD        ROS: Review of Systems  Constitutional:  Negative for activity change, appetite change, chills, fatigue and unexpected weight change.  HENT:  Negative for congestion, mouth sores and sinus pressure.   Eyes:  Negative for visual disturbance.  Respiratory:  Negative for cough and chest tightness.   Gastrointestinal:  Negative for abdominal pain and nausea.  Genitourinary:  Negative for difficulty urinating, frequency and vaginal pain.  Musculoskeletal:  Positive for back pain and gait problem.  Skin:  Negative for pallor and rash.  Neurological:  Negative for dizziness, tremors, weakness, numbness and headaches.  Hematological:  Does not bruise/bleed easily.  Psychiatric/Behavioral:  Negative for confusion and sleep  disturbance. The patient is nervous/anxious.     Objective:  BP 110/70 (BP Location: Left Arm, Patient Position: Sitting, Cuff Size: Large)   Pulse (!) 111   Temp 98.8 F (37.1 C) (Oral)   Ht 5\' 4"  (1.626 m)   Wt 223 lb (101.2 kg)   LMP 04/11/2012 Comment: ablation  SpO2 92%   BMI 38.28 kg/m   BP Readings from Last 3 Encounters:  03/11/23 110/70  11/29/22 132/80  09/26/22 124/82    Wt Readings from Last 3 Encounters:  03/11/23 223 lb (101.2 kg)  11/29/22 225 lb (102.1 kg)  09/26/22 218 lb (98.9 kg)    Physical Exam Constitutional:      General: She is  not in acute distress.    Appearance: She is well-developed. She is obese.  HENT:     Head: Normocephalic.     Right Ear: External ear normal.     Left Ear: External ear normal.     Nose: Nose normal.  Eyes:     General:        Right eye: No discharge.        Left eye: No discharge.     Conjunctiva/sclera: Conjunctivae normal.     Pupils: Pupils are equal, round, and reactive to light.  Neck:     Thyroid: No thyromegaly.     Vascular: No JVD.     Trachea: No tracheal deviation.  Cardiovascular:     Rate and Rhythm: Normal rate and regular rhythm.     Heart sounds: Normal heart sounds.  Pulmonary:     Effort: No respiratory distress.     Breath sounds: No stridor. No wheezing.  Abdominal:     General: Bowel sounds are normal. There is no distension.     Palpations: Abdomen is soft. There is no mass.     Tenderness: There is no abdominal tenderness. There is no guarding or rebound.  Musculoskeletal:        General: No tenderness.     Cervical back: Normal range of motion and neck supple. No rigidity.  Lymphadenopathy:     Cervical: No cervical adenopathy.  Skin:    Findings: No erythema or rash.  Neurological:     Mental Status: She is oriented to person, place, and time.     Cranial Nerves: No cranial nerve deficit.     Motor: No abnormal muscle tone.     Coordination: Coordination normal.     Gait: Gait abnormal.     Deep Tendon Reflexes: Reflexes normal.  Psychiatric:        Behavior: Behavior normal.        Thought Content: Thought content normal.        Judgment: Judgment normal.     Lab Results  Component Value Date   WBC 10.1 08/07/2022   HGB 14.4 08/07/2022   HCT 42.9 08/07/2022   PLT 244.0 08/07/2022   GLUCOSE 86 08/07/2022   CHOL 224 (H) 08/07/2022   TRIG 79.0 08/07/2022   HDL 65.60 08/07/2022   LDLCALC 142 (H) 08/07/2022   ALT 19 08/07/2022   AST 17 08/07/2022   NA 140 08/07/2022   K 3.7 08/07/2022   CL 101 08/07/2022   CREATININE 0.65  08/07/2022   BUN 12 08/07/2022   CO2 32 08/07/2022   TSH 4.86 11/29/2022   HGBA1C 6.2 08/07/2022    MR Lumbar Spine Wo Contrast  Result Date: 10/26/2022 CLINICAL DATA:  Back trauma with abnormal neuro exam, CT or x-ray  positive Pain in the left lower extremity for 4-6 weeks. Numbness on the left side. EXAM: MRI LUMBAR SPINE WITHOUT CONTRAST TECHNIQUE: Multiplanar, multisequence MR imaging of the lumbar spine was performed. No intravenous contrast was administered. COMPARISON:  11/11/2017 FINDINGS: Segmentation:  Standard. Alignment:  Mild degenerative anterolisthesis at L3-4 Vertebrae:  No fracture, evidence of discitis, or bone lesion. Conus medullaris and cauda equina: Conus extends to the L1 level. Conus and cauda equina appear normal. Paraspinal and other soft tissues: Postoperative scarring involving lower posterior soft tissues. Disc levels: T12- L1: Unremarkable. L1-L2: Mild disc height loss and bulging. L2-L3: Minor disc bulging L3-L4: Degenerative facet spurring with mild anterolisthesis since prior. Small upward pointing disc protrusion. L4-L5: Disc narrowing and bulging with left paracentral herniation that is new or increased and contacts the descending left L5 nerve root. Prior left subarticular recess decompression. Disc height loss and spurring causes noncompressive left foraminal narrowing L5-S1:Disc narrowing and bulging with left paracentral herniation based on sagittal T2 weighted imaging, impinging on the left S1 nerve root. IMPRESSION: 1. 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 There has been prior decompression at the left subarticular recess of both levels, but discrete and new herniations are suggested when compared to 2019. 2. L3-4 facet degeneration with interval anterolisthesis. Electronically Signed   By: Tiburcio Pea M.D.   On: 10/26/2022 12:00    Assessment & Plan:   Problem List Items Addressed This Visit      Obesity    Wt Readings from Last 3 Encounters:  03/11/23 223 lb (101.2 kg)  11/29/22 225 lb (102.1 kg)  09/26/22 218 lb (98.9 kg)        Relevant Medications   VYVANSE 50 MG capsule   VYVANSE 50 MG capsule   lisdexamfetamine (VYVANSE) 50 MG capsule   Depression    Chronic symptoms.   Continue with Pristiq 100 mg daily.      Low back pain - Primary    Smoking less: on 3 packs/wk from 1.5 PPD      Relevant Medications   HYDROcodone-acetaminophen (NORCO) 10-325 MG tablet   HYDROcodone-acetaminophen (NORCO) 10-325 MG tablet   HYDROcodone-acetaminophen (NORCO) 10-325 MG tablet   Arthralgia    Chronic       ADHD (attention deficit hyperactivity disorder)    On Rx -Vyvance  Potential benefits of a long term stimulant use as well as potential risks  and complications were explained to the patient and were aknowledged.      Tobacco use disorder    Smoking less: on 3 packs/wk from 1.5 PPD         Meds ordered this encounter  Medications   gabapentin (NEURONTIN) 300 MG capsule    Sig: TAKE 1 CAPSULE(300 MG) BY MOUTH TWICE DAILY AS NEEDED    Dispense:  60 capsule    Refill:  1   valACYclovir (VALTREX) 500 MG tablet    Sig: Take 1 tablet (500 mg total) by mouth daily. Increase to bid prn with outbreak    Dispense:  180 tablet    Refill:  0   HYDROcodone-acetaminophen (NORCO) 10-325 MG tablet    Sig: Take 1 tablet by mouth every 6 (six) hours as needed for severe pain. Please fill on or after 05/10/23    Dispense:  90 tablet    Refill:  0   VYVANSE 50 MG capsule    Sig: Take 1 capsule (50 mg total) by mouth every  morning.    Dispense:  30 capsule    Refill:  0    Please fill on or after 04/10/23   VYVANSE 50 MG capsule    Sig: Take 1 capsule (50 mg total) by mouth every morning.    Dispense:  30 capsule    Refill:  0    Please fill on or after 05/10/23   HYDROcodone-acetaminophen (NORCO) 10-325 MG tablet    Sig: Take 1 tablet by mouth every 8 (eight) hours as needed  for severe pain.    Dispense:  90 tablet    Refill:  0    Please fill on or after 04/10/23   HYDROcodone-acetaminophen (NORCO) 10-325 MG tablet    Sig: Take 1 tablet by mouth every 8 (eight) hours as needed for severe pain.    Dispense:  90 tablet    Refill:  0    Please fill on or after 03/11/23   lisdexamfetamine (VYVANSE) 50 MG capsule    Sig: Take 1 capsule (50 mg total) by mouth daily.    Dispense:  30 capsule    Refill:  0    Please fill on or after 03/11/23   clotrimazole-betamethasone (LOTRISONE) cream    Sig: Apply 1 Application topically daily.    Dispense:  90 g    Refill:  1      Follow-up: Return in about 3 months (around 06/11/2023) for a follow-up visit.  Sonda Primes, MD

## 2023-03-11 NOTE — Assessment & Plan Note (Signed)
On Rx -Vyvance  Potential benefits of a long term stimulant use as well as potential risks  and complications were explained to the patient and were aknowledged. 

## 2023-05-07 ENCOUNTER — Encounter: Payer: Self-pay | Admitting: Internal Medicine

## 2023-05-21 ENCOUNTER — Other Ambulatory Visit: Payer: Self-pay | Admitting: Internal Medicine

## 2023-05-21 MED ORDER — FLUCONAZOLE 150 MG PO TABS: 150.0000 mg | ORAL_TABLET | Freq: Once | ORAL | 1 refills | Status: AC

## 2023-05-22 ENCOUNTER — Encounter (INDEPENDENT_AMBULATORY_CARE_PROVIDER_SITE_OTHER): Payer: Self-pay

## 2023-06-06 ENCOUNTER — Other Ambulatory Visit: Payer: Self-pay | Admitting: Internal Medicine

## 2023-06-11 ENCOUNTER — Ambulatory Visit: Payer: 59 | Admitting: Internal Medicine

## 2023-06-17 MED ORDER — VYVANSE 50 MG PO CAPS: 50.0000 mg | ORAL_CAPSULE | ORAL | 0 refills | Status: DC

## 2023-06-30 ENCOUNTER — Ambulatory Visit: Payer: 59 | Admitting: Internal Medicine

## 2023-06-30 ENCOUNTER — Encounter: Payer: Self-pay | Admitting: Internal Medicine

## 2023-06-30 VITALS — BP 110/70 | HR 93 | Temp 98.0°F | Wt 211.0 lb

## 2023-06-30 DIAGNOSIS — F419 Anxiety disorder, unspecified: Secondary | ICD-10-CM | POA: Diagnosis not present

## 2023-06-30 DIAGNOSIS — F4321 Adjustment disorder with depressed mood: Secondary | ICD-10-CM | POA: Insufficient documentation

## 2023-06-30 DIAGNOSIS — F909 Attention-deficit hyperactivity disorder, unspecified type: Secondary | ICD-10-CM

## 2023-06-30 DIAGNOSIS — M5440 Lumbago with sciatica, unspecified side: Secondary | ICD-10-CM

## 2023-06-30 DIAGNOSIS — R739 Hyperglycemia, unspecified: Secondary | ICD-10-CM

## 2023-06-30 DIAGNOSIS — E6609 Other obesity due to excess calories: Secondary | ICD-10-CM | POA: Diagnosis not present

## 2023-06-30 DIAGNOSIS — G8929 Other chronic pain: Secondary | ICD-10-CM

## 2023-06-30 DIAGNOSIS — F3341 Major depressive disorder, recurrent, in partial remission: Secondary | ICD-10-CM

## 2023-06-30 DIAGNOSIS — Z0000 Encounter for general adult medical examination without abnormal findings: Secondary | ICD-10-CM

## 2023-06-30 DIAGNOSIS — Z6832 Body mass index (BMI) 32.0-32.9, adult: Secondary | ICD-10-CM

## 2023-06-30 MED ORDER — HYDROCODONE-ACETAMINOPHEN 10-325 MG PO TABS: 1.0000 | ORAL_TABLET | Freq: Four times a day (QID) | ORAL | 0 refills | Status: DC | PRN

## 2023-06-30 MED ORDER — GABAPENTIN 300 MG PO CAPS: ORAL_CAPSULE | ORAL | 3 refills | Status: DC

## 2023-06-30 MED ORDER — DESVENLAFAXINE SUCCINATE ER 100 MG PO TB24: 100.0000 mg | ORAL_TABLET | Freq: Every day | ORAL | 3 refills | Status: DC

## 2023-06-30 MED ORDER — VYVANSE 50 MG PO CAPS: 50.0000 mg | ORAL_CAPSULE | ORAL | 0 refills | Status: DC

## 2023-06-30 NOTE — Progress Notes (Signed)
Subjective:  Patient ID: Tamara Cross, female    DOB: July 04, 1966  Age: 57 y.o. MRN: [id number]  CC: Follow-up (3 mnth f/u)   HPI Tamara Cross presents for LBP, depression, asthma   Father died at 26 - [place] 04-09-23  Outpatient Medications Prior to Visit  Medication Sig Dispense Refill   acetaminophen (TYLENOL) 500 MG tablet Take 1,000 mg by mouth every 6 (six) hours as needed for headache.     albuterol (VENTOLIN HFA) 108 (90 Base) MCG/ACT inhaler Inhale 1-2 puffs into the lungs every 6 (six) hours as needed for wheezing or shortness of breath. 3 Inhaler 3   budesonide-formoterol (SYMBICORT) 160-4.5 MCG/ACT inhaler Inhale 2 puffs into the lungs 2 (two) times daily. (Patient taking differently: Inhale 2 puffs into the lungs 3 (three) times a week.) 3 Inhaler 3   cetirizine (ZYRTEC) 10 MG tablet Take 10 mg by mouth daily as needed for allergies.     clotrimazole-betamethasone (LOTRISONE) cream Apply 1 Application topically daily. 90 g 1   cyclobenzaprine (FLEXERIL) 10 MG tablet Take 1 tablet (10 mg total) by mouth 3 (three) times daily as needed for muscle spasms. 90 tablet 1   HYDROcodone-acetaminophen (NORCO) 10-325 MG tablet Take 1 tablet by mouth every 8 (eight) hours as needed for severe pain. 90 tablet 0   ibuprofen (ADVIL) 200 MG tablet Take 400 mg by mouth every 6 (six) hours as needed (Back pain).     lisdexamfetamine (VYVANSE) 50 MG capsule Take 1 capsule (50 mg total) by mouth daily. 30 capsule 0   metoprolol succinate (TOPROL-XL) 50 MG 24 hr tablet Take 1 tablet (50 mg total) by mouth daily. Take with or immediately following a meal. 30 tablet 11   pantoprazole (PROTONIX) 40 MG tablet Take 1 tablet (40 mg total) by mouth 2 (two) times daily. 60 tablet 5   predniSONE (DELTASONE) 10 MG tablet Take 1 tablet (10 mg total) by mouth daily with breakfast. Take pc. 30 tablet 0   Soft Lens Products (SENSITIVE EYES SALINE) SOLN Place 1 drop into both eyes daily. Saline eye lub      valACYclovir (VALTREX) 500 MG tablet Take 1 tablet (500 mg total) by mouth daily. Increase to bid prn with outbreak 180 tablet 0   VYVANSE 50 MG capsule Take 1 capsule (50 mg total) by mouth every morning. 30 capsule 0   desvenlafaxine (PRISTIQ) 100 MG 24 hr tablet Take 1 tablet (100 mg total) by mouth daily. 90 tablet 3   gabapentin (NEURONTIN) 300 MG capsule TAKE 1 CAPSULE(300 MG) BY MOUTH TWICE DAILY AS NEEDED 60 capsule 1   HYDROcodone-acetaminophen (NORCO) 10-325 MG tablet Take 1 tablet by mouth every 6 (six) hours as needed for severe pain. Please fill on or after 05/10/23 90 tablet 0   VYVANSE 50 MG capsule Take 1 capsule (50 mg total) by mouth every morning. 30 capsule 0   Facility-Administered Medications Prior to Visit  Medication Dose Route Frequency Provider Last Rate Last Admin   gadopentetate dimeglumine (MAGNEVIST) injection 18 mL  18 mL Intravenous Once PRN Penumalli, Vikram R, MD        ROS: Review of Systems  Constitutional:  Negative for activity change, appetite change, chills, fatigue and unexpected weight change.  HENT:  Negative for congestion, mouth sores and sinus pressure.   Eyes:  Negative for visual disturbance.  Respiratory:  Negative for cough and chest tightness.   Gastrointestinal:  Negative for abdominal pain and nausea.  Genitourinary:  Negative for difficulty urinating, frequency and vaginal pain.  Musculoskeletal:  Positive for back pain. Negative for gait problem.  Skin:  Negative for pallor and rash.  Neurological:  Negative for dizziness, tremors, weakness, numbness and headaches.  Psychiatric/Behavioral:  Negative for confusion, sleep disturbance and suicidal ideas. The patient is nervous/anxious.     Objective:  BP 110/70 (BP Location: Left Arm, Patient Position: Sitting, Cuff Size: Normal)   Pulse 93   Temp 98 F (36.7 C) (Oral)   Wt 211 lb (95.7 kg)   LMP 04/11/2012 Comment: ablation  SpO2 98%   BMI 36.22 kg/m   BP Readings from Last 3  Encounters:  06/30/23 110/70  03/11/23 110/70  11/29/22 132/80    Wt Readings from Last 3 Encounters:  06/30/23 211 lb (95.7 kg)  03/11/23 223 lb (101.2 kg)  11/29/22 225 lb (102.1 kg)    Physical Exam Constitutional:      General: She is not in acute distress.    Appearance: She is well-developed. She is obese.  HENT:     Head: Normocephalic.     Right Ear: External ear normal.     Left Ear: External ear normal.     Nose: Nose normal.  Eyes:     General:        Right eye: No discharge.        Left eye: No discharge.     Conjunctiva/sclera: Conjunctivae normal.     Pupils: Pupils are equal, round, and reactive to light.  Neck:     Thyroid: No thyromegaly.     Vascular: No JVD.     Trachea: No tracheal deviation.  Cardiovascular:     Rate and Rhythm: Normal rate and regular rhythm.     Heart sounds: Normal heart sounds.  Pulmonary:     Effort: No respiratory distress.     Breath sounds: No stridor. No wheezing.  Abdominal:     General: Bowel sounds are normal. There is no distension.     Palpations: Abdomen is soft. There is no mass.     Tenderness: There is no abdominal tenderness. There is no guarding or rebound.  Musculoskeletal:        General: Tenderness present.     Cervical back: Normal range of motion and neck supple. No rigidity.  Lymphadenopathy:     Cervical: No cervical adenopathy.  Skin:    Findings: No erythema or rash.  Neurological:     Mental Status: She is oriented to person, place, and time.     Cranial Nerves: No cranial nerve deficit.     Motor: No abnormal muscle tone.     Coordination: Coordination normal.     Deep Tendon Reflexes: Reflexes normal.  Psychiatric:        Behavior: Behavior normal.        Thought Content: Thought content normal.        Judgment: Judgment normal.   LS  w/pain  Lab Results  Component Value Date   WBC 10.1 08/07/2022   HGB 14.4 08/07/2022   HCT 42.9 08/07/2022   PLT 244.0 08/07/2022   GLUCOSE 86  08/07/2022   CHOL 224 (H) 08/07/2022   TRIG 79.0 08/07/2022   HDL 65.60 08/07/2022   LDLCALC 142 (H) 08/07/2022   ALT 19 08/07/2022   AST 17 08/07/2022   NA 140 08/07/2022   K 3.7 08/07/2022   CL 101 08/07/2022   CREATININE 0.65 08/07/2022   BUN 12 08/07/2022  CO2 32 08/07/2022   TSH 4.86 11/29/2022   HGBA1C 6.2 08/07/2022    MR Lumbar Spine Wo Contrast  Result Date: 10/26/2022 CLINICAL DATA:  Back trauma with abnormal neuro exam, CT or x-ray positive Pain in the left lower extremity for 4-6 weeks. Numbness on the left side. EXAM: MRI LUMBAR SPINE WITHOUT CONTRAST TECHNIQUE: Multiplanar, multisequence MR imaging of the lumbar spine was performed. No intravenous contrast was administered. COMPARISON:  11/11/2017 FINDINGS: Segmentation:  Standard. Alignment:  Mild degenerative anterolisthesis at L3-4 Vertebrae:  No fracture, evidence of discitis, or bone lesion. Conus medullaris and cauda equina: Conus extends to the L1 level. Conus and cauda equina appear normal. Paraspinal and other soft tissues: Postoperative scarring involving lower posterior soft tissues. Disc levels: T12- L1: Unremarkable. L1-L2: Mild disc height loss and bulging. L2-L3: Minor disc bulging L3-L4: Degenerative facet spurring with mild anterolisthesis since prior. Small upward pointing disc protrusion. L4-L5: Disc narrowing and bulging with left paracentral herniation that is new or increased and contacts the descending left L5 nerve root. Prior left subarticular recess decompression. Disc height loss and spurring causes noncompressive left foraminal narrowing L5-S1:Disc narrowing and bulging with left paracentral herniation based on sagittal T2 weighted imaging, impinging on the left S1 nerve root. IMPRESSION: 1. 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 There has been prior decompression at the left subarticular recess of both levels, but discrete and new herniations  are suggested when compared to 2019. 2. L3-4 facet degeneration with interval anterolisthesis. Electronically Signed   By: Tiburcio Pea M.D.   On: 10/26/2022 12:00    Assessment & Plan:   Problem List Items Addressed This Visit     Obesity    Wt Readings from Last 3 Encounters:  06/30/23 211 lb (95.7 kg)  03/11/23 223 lb (101.2 kg)  11/29/22 225 lb (102.1 kg)   On diet      Relevant Medications   VYVANSE 50 MG capsule   Anxiety disorder - Primary    On Pristique      Relevant Medications   desvenlafaxine (PRISTIQ) 100 MG 24 hr tablet   Other Relevant Orders   T4, free   T3, free   Depression    Chronic symptoms.   Continue with Pristiq 100 mg daily.      Relevant Medications   desvenlafaxine (PRISTIQ) 100 MG 24 hr tablet   Other Relevant Orders   TSH   Urinalysis   CBC with Differential/Platelet   Lipid panel   Comprehensive metabolic panel   T4, free   T3, free   Pain Management Screening Profile (10S)   Low back pain    Smoking less      Relevant Medications   HYDROcodone-acetaminophen (NORCO) 10-325 MG tablet   Other Relevant Orders   Pain Management Screening Profile (10S)   ADHD (attention deficit hyperactivity disorder)    On Rx -Vyvance  Potential benefits of a long term stimulant use as well as potential risks  and complications were explained to the patient and were aknowledged.      Hyperglycemia    Check A1c      Relevant Orders   Comprehensive metabolic panel   Grief    Father died at 83 - [place] 2023-04-11     Other Visit Diagnoses     Well adult exam       Relevant Orders   TSH   Urinalysis   CBC with Differential/Platelet  Lipid panel   Comprehensive metabolic panel   T4, free   T3, free         Meds ordered this encounter  Medications   gabapentin (NEURONTIN) 300 MG capsule    Sig: TAKE 1 CAPSULE(300 MG) BY MOUTH TWICE DAILY AS NEEDED    Dispense:  60 capsule    Refill:  3   HYDROcodone-acetaminophen (NORCO)  10-325 MG tablet    Sig: Take 1 tablet by mouth every 6 (six) hours as needed for severe pain. Please fill on or after 06/30/23    Dispense:  90 tablet    Refill:  0   desvenlafaxine (PRISTIQ) 100 MG 24 hr tablet    Sig: Take 1 tablet (100 mg total) by mouth daily.    Dispense:  90 tablet    Refill:  3   VYVANSE 50 MG capsule    Sig: Take 1 capsule (50 mg total) by mouth every morning.    Dispense:  30 capsule    Refill:  0    Please fill on or after 06/30/23      Follow-up: Return in about 3 months (around 09/29/2023) for Wellness Exam.  Sonda Primes, MD

## 2023-06-30 NOTE — Assessment & Plan Note (Signed)
Check A1c.

## 2023-06-30 NOTE — Assessment & Plan Note (Signed)
Father died at 26 - [place] July 1st

## 2023-06-30 NOTE — Assessment & Plan Note (Signed)
Smoking less

## 2023-06-30 NOTE — Assessment & Plan Note (Signed)
On Rx -Vyvance  Potential benefits of a long term stimulant use as well as potential risks  and complications were explained to the patient and were aknowledged.

## 2023-06-30 NOTE — Assessment & Plan Note (Signed)
Chronic symptoms.  Continue with Pristiq 100 mg daily.

## 2023-06-30 NOTE — Assessment & Plan Note (Signed)
On Pristique

## 2023-06-30 NOTE — Assessment & Plan Note (Signed)
Wt Readings from Last 3 Encounters:  06/30/23 211 lb (95.7 kg)  03/11/23 223 lb (101.2 kg)  11/29/22 225 lb (102.1 kg)   On diet

## 2023-09-24 ENCOUNTER — Encounter: Payer: Self-pay | Admitting: Internal Medicine

## 2023-09-27 ENCOUNTER — Other Ambulatory Visit: Payer: Self-pay | Admitting: Internal Medicine

## 2023-09-27 MED ORDER — HYDROCODONE-ACETAMINOPHEN 10-325 MG PO TABS: 1.0000 | ORAL_TABLET | Freq: Three times a day (TID) | ORAL | 0 refills | Status: DC | PRN

## 2023-09-28 ENCOUNTER — Ambulatory Visit (HOSPITAL_COMMUNITY)
Admission: EM | Admit: 2023-09-28 | Discharge: 2023-09-28 | Disposition: A | Discharge status: Home / Self Care | Payer: 59 | Attending: Internal Medicine | Admitting: Internal Medicine

## 2023-09-28 DIAGNOSIS — S61511A Laceration without foreign body of right wrist, initial encounter: Secondary | ICD-10-CM | POA: Diagnosis not present

## 2023-09-28 MED ORDER — FLUCONAZOLE 150 MG PO TABS: 150.0000 mg | ORAL_TABLET | Freq: Every day | ORAL | 0 refills | Status: DC

## 2023-09-28 MED ORDER — LIDOCAINE-EPINEPHRINE 1 %-1:100000 IJ SOLN
INTRAMUSCULAR | Status: AC
  Filled 2023-09-28: qty 1

## 2023-09-28 MED ORDER — DOXYCYCLINE HYCLATE 100 MG PO CAPS: 100.0000 mg | ORAL_CAPSULE | Freq: Two times a day (BID) | ORAL | 0 refills | Status: AC

## 2023-09-28 NOTE — Discharge Instructions (Addendum)
Laceration on the right anterior wrist. 3 Sutures placed. Instructions as follows:   Leave bandage in place for at least 12 hours, then may remove and wash the area with soap and water. Then cover with another bandage. May use ace for comfort. Do not submerge the area until completely healed.  Suture should be removed in 7-10 days. Can come here or PCP Doxycycline 100 mg twice daily for 4 days Diflucan 150 mg Take 1 tablet at first signs of yeast infection, then repeat in 3 days Return to urgent care or PCP if symptoms worsen or fail to resolve.

## 2023-09-28 NOTE — ED Provider Notes (Signed)
[facility]    CSN: [id number] Arrival date & time: 09/28/23  1651      History   Chief Complaint Chief Complaint  Patient presents with   Laceration    HPI Tamara Cross is a 57 y.o. female.   57 year old female who presents to urgent care with complaints of a laceration to the right anterior wrist.  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  She was cleaning some dried glue off the scissors when she jabbed the scissors into her wrist by accident.  She reports that she thought she saw structures come out of the wound when she pulled the scissors out.  She is having significant pain into her hand but has motor and sensory function intact.  Her last tetanus was in 2019.   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 -   Complication of anesthesia    last 2 surgeries had some tachycardia   Depression    Dysrhythmia    tachy occ   Esophageal stricture    GERD (gastroesophageal reflux disease)    H/O hiatal hernia    Heart murmur 1990s   MVP   IBS (irritable bowel syndrome)    Migraine    migrains are rare   MVP (mitral valve prolapse)    Osteoarthritis    Peptic ulcer disease    PONV (postoperative nausea and vomiting)    S/P radiation therapy  06/28/2014-08/08/2014    1) Right Breast / 50 Gy in 25 fractions/ 2) Right Breast Boost / 10 Gy in 5 fractions   Skin cancer    basal cell     Patient Active Problem List   Diagnosis Date Noted   Grief 06/30/2023   Tobacco use disorder 03/11/2023   Dysuria 09/26/2022   Fall (on) (from) other stairs and steps, initial encounter 09/26/2022   Abnormal TSH 08/10/2022   Swimmer's ear 08/07/2022   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   Hyperglycemia 03/19/2022    RUQ abdominal pain 09/26/2021   Nausea & vomiting 09/26/2021   Acute cough 07/20/2021   Shortness of breath 07/20/2021   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   Lipoma 09/25/2020   ADHD (attention deficit hyperactivity disorder) 07/12/2019   Back spasm 02/22/2019   S/P lumbar laminectomy 07/29/2018   Chronic lumbar radiculopathy 05/14/2018   Knee osteoarthritis 02/04/2018   HNP (herniated nucleus pulposus), lumbar 11/14/2017   Sciatic leg pain 10/21/2017   Sinusitis, chronic 10/15/2016   Asthmatic bronchitis 04/03/2016   Viral URI with cough 12/20/2015   Ataxia 05/30/2015   Liver lesion, left lobe 01/03/2015   Gallstone pancreatitis 11/28/2014   Dysphagia, pharyngoesophageal phase 10/06/2014   Special screening for malignant neoplasm of colon 10/06/2014   Dysphagia    Special screening for malignant neoplasms, colon    Arthralgia 09/12/2014   Submuscular lipoma of chest 09/12/2014   Leg pain 08/29/2014   Hot flashes due to tamoxifen 08/29/2014   Low back pain 04/26/2014   Rash and nonspecific skin eruption 04/26/2014   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   Palpitations 11/18/2013   Allergy    Heart murmur  HIP PAIN 09/07/2010   PEDAL EDEMA 04/26/2010   EXTERNAL HEMORRHOIDS 01/24/2010   HIATAL HERNIA 01/24/2010   GERD 12/19/2009   Irritable bowel syndrome 12/19/2009   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   Migraine 08/23/2008   ESOPHAGEAL STRICTURE 03/11/2008   POLYARTHRALGIA 03/11/2008   Obesity 05/01/2007   Anxiety disorder 05/01/2007   Depression 05/01/2007   PREMATURE VENTRICULAR CONTRACTIONS 05/01/2007   Allergic rhinitis 05/01/2007   MITRAL VALVE PROLAPSE, HX OF 05/01/2007   PUD, HX OF 05/01/2007    Past Surgical History:  Procedure Laterality Date   APPENDECTOMY     BREAST LUMPECTOMY Bilateral    BREAST LUMPECTOMY WITH NEEDLE LOCALIZATION Bilateral 05/02/2014   Procedure: BILATERIAL BREAST LUMPECTOMY WITH NEEDLE LOCALIZATION;   Surgeon: Kandis Cocking, MD;  Location: [facility];  Service: General;  Laterality: Bilateral;   CERVIX LESION DESTRUCTION     CESAREAN SECTION  2002   CHOLECYSTECTOMY  11/29/2014   lap chole    CHOLECYSTECTOMY N/A 11/29/2014   Procedure: LAPAROSCOPIC CHOLECYSTECTOMY WITH INTRAOPERATIVE CHOLANGIOGRAM;  Surgeon: Frederik Schmidt, MD;  Location: [facility] OR;  Service: General;  Laterality: N/A;   COLONOSCOPY N/A 10/06/2014   Procedure: COLONOSCOPY;  Surgeon: Hart Carwin, MD;  Location: [facility];  Service: Endoscopy;  Laterality: N/A;   COLPOSCOPY     CYSTECTOMY     left breast,in 20's   DILATION AND CURETTAGE OF UTERUS     x2   ESOPHAGOGASTRODUODENOSCOPY N/A 10/06/2014   Procedure: ESOPHAGOGASTRODUODENOSCOPY (EGD);  Surgeon: Hart Carwin, MD;  Location: [facility];  Service: Endoscopy;  Laterality: N/A;   HAND SURGERY     cancer removed basal cell   LIPOMA EXCISION Right 11/21/2020   Procedure: REMOVAL OF RIGHT BACK LIPOMA;  Surgeon: Luretha Murphy, MD;  Location: [facility];  Service: General;  Laterality: Right;  ROOM 2 STARTING AT 10:30AM   LUMBAR LAMINECTOMY/DECOMPRESSION MICRODISCECTOMY Left 11/14/2017   Procedure: DISCECTOMY LUMBAR Five - SACRAL - one, LEFT;  Surgeon: Coletta Memos, MD;  Location: [facility];  Service: Neurosurgery;  Laterality: Left;  MICRODISCECTOMY LUMBAR 5- SACRAL 1, LEFT   LUMBAR LAMINECTOMY/DECOMPRESSION MICRODISCECTOMY Left 01/30/2018   Procedure: MICRODISCECTOMY LUMBAR 4- LUMBAR 5 LEFT;  Surgeon: Coletta Memos, MD;  Location: [facility];  Service: Neurosurgery;  Laterality: Left;  MICRODISCECTOMY LUMBAR 4- LUMBAR 5 LEFT   NOVASURE ABLATION     SHOULDER SURGERY Left    debridement ,bone spurs   TONSILLECTOMY      OB History     Gravida  1   Para  1   Term      Preterm      AB      Living         SAB      IAB      Ectopic      Multiple      Live Births           Obstetric Comments  She is not on hormones.  She had her last menstrual period around 2013.           Home Medications    Prior to Admission medications   Medication Sig Start Date End Date Taking? Authorizing Provider  doxycycline (VIBRAMYCIN) 100 MG capsule Take 1 capsule (100 mg total) by mouth 2 (two) times daily for 4 days. 09/28/23 10/02/23 Yes Cregg Jutte A, PA-C  fluconazole (DIFLUCAN) 150 MG tablet Take 1 tablet (150 mg total) by mouth daily. Take 1 tablet at first signs of yeast infection, then repeat in 3  days 09/28/23  Yes Janaiya Beauchesne A, PA-C  acetaminophen (TYLENOL) 500 MG tablet Take 1,000 mg by mouth every 6 (six) hours as needed for headache.    [provider]  albuterol (VENTOLIN HFA) 108 (90 Base) MCG/ACT inhaler Inhale 1-2 puffs into the lungs every 6 (six) hours as needed for wheezing or shortness of breath. 02/22/19   Plotnikov, Georgina Quint, MD  budesonide-formoterol (SYMBICORT) 160-4.5 MCG/ACT inhaler Inhale 2 puffs into the lungs 2 (two) times daily. Patient taking differently: Inhale 2 puffs into the lungs 3 (three) times a week. 02/22/19   Plotnikov, Georgina Quint, MD  cetirizine (ZYRTEC) 10 MG tablet Take 10 mg by mouth daily as needed for allergies.    [provider]  clotrimazole-betamethasone (LOTRISONE) cream Apply 1 Application topically daily. 03/11/23   Plotnikov, Georgina Quint, MD  cyclobenzaprine (FLEXERIL) 10 MG tablet Take 1 tablet (10 mg total) by mouth 3 (three) times daily as needed for muscle spasms. 01/09/23   Plotnikov, Georgina Quint, MD  desvenlafaxine (PRISTIQ) 100 MG 24 hr tablet Take 1 tablet (100 mg total) by mouth daily. 06/30/23   Plotnikov, Georgina Quint, MD  gabapentin (NEURONTIN) 300 MG capsule TAKE 1 CAPSULE(300 MG) BY MOUTH TWICE DAILY AS NEEDED 06/30/23   Plotnikov, Georgina Quint, MD  HYDROcodone-acetaminophen (NORCO) 10-325 MG tablet Take 1 tablet by mouth every 6 (six) hours as needed for severe pain. Please fill on or after 06/30/23 06/30/23   Plotnikov, Georgina Quint, MD  HYDROcodone-acetaminophen (NORCO) 10-325 MG tablet Take 1  tablet by mouth every 8 (eight) hours as needed for severe pain (pain score 7-10). 09/27/23   Plotnikov, Georgina Quint, MD  ibuprofen (ADVIL) 200 MG tablet Take 400 mg by mouth every 6 (six) hours as needed (Back pain).    [provider]  lisdexamfetamine (VYVANSE) 50 MG capsule Take 1 capsule (50 mg total) by mouth daily. 03/11/23   Plotnikov, Georgina Quint, MD  metoprolol succinate (TOPROL-XL) 50 MG 24 hr tablet Take 1 tablet (50 mg total) by mouth daily. Take with or immediately following a meal. 11/14/22   Plotnikov, Georgina Quint, MD  pantoprazole (PROTONIX) 40 MG tablet Take 1 tablet (40 mg total) by mouth 2 (two) times daily. 09/26/21   Plotnikov, Georgina Quint, MD  predniSONE (DELTASONE) 10 MG tablet Take 1 tablet (10 mg total) by mouth daily with breakfast. Take pc. 11/29/22   Plotnikov, Georgina Quint, MD  Soft Lens Products (SENSITIVE EYES SALINE) SOLN Place 1 drop into both eyes daily. Saline eye lub    [provider]  valACYclovir (VALTREX) 500 MG tablet Take 1 tablet (500 mg total) by mouth daily. Increase to bid prn with outbreak 03/11/23   Plotnikov, Georgina Quint, MD  VYVANSE 50 MG capsule Take 1 capsule (50 mg total) by mouth every morning. 06/17/23   Plotnikov, Georgina Quint, MD  VYVANSE 50 MG capsule Take 1 capsule (50 mg total) by mouth every morning. 06/30/23   Plotnikov, Georgina Quint, MD    Family History Family History  Problem Relation Age of Onset   Heart disease Father    Hypertension Father    Colon cancer Maternal Grandmother    Depression Brother    Diabetes Maternal Grandfather    Heart disease Maternal Grandfather    Colon cancer Maternal Uncle        x 2   Colon polyps Other        x 9 maternal uncles/aunts   Breast cancer Neg Hx     Social  History Social History   Tobacco Use   Smoking status: Some Days    Current packs/day: 0.50    Average packs/day: 0.5 packs/day for 25.6 years (12.8 ttl pk-yrs)    Types: Cigarettes    Start date: 02/23/1998   Smokeless tobacco:  Never  Vaping Use   Vaping status: Never Used  Substance Use Topics   Alcohol use: Yes    Alcohol/week: 5.0 standard drinks of alcohol    Types: 5 Glasses of wine per week    Comment: socially   Drug use: No    Comment: quit for 15 yrs smoked for 15 started again 1 yr     Allergies   Imitrex [sumatriptan], Sulfonamide derivatives, Reglan [metoclopramide], Zofran [ondansetron hcl], Sulfamethoxazole-trimethoprim, and Hibiclens [chlorhexidine]   Review of Systems Review of Systems  Constitutional:  Negative for chills and fever.  HENT:  Negative for ear pain and sore throat.   Eyes:  Negative for pain and visual disturbance.  Respiratory:  Negative for cough and shortness of breath.   Cardiovascular:  Negative for chest pain and palpitations.  Gastrointestinal:  Negative for abdominal pain and vomiting.  Genitourinary:  Negative for dysuria and hematuria.  Musculoskeletal:  Negative for arthralgias and back pain.  Skin:  Negative for color change and rash.       Laceration on the right anterior wrist  Neurological:  Negative for seizures and syncope.  All other systems reviewed and are negative.    Physical Exam Triage Vital Signs ED Triage Vitals [09/28/23 1657]  Encounter Vitals Group     BP (!) 158/97     Systolic BP Percentile      Diastolic BP Percentile      Pulse Rate (!) 114     Resp 20     Temp 98.5 F (36.9 C)     Temp Source Oral     SpO2 99 %     Weight      Height      Head Circumference      Peak Flow      Pain Score      Pain Loc      Pain Education      Exclude from Growth Chart    No data found.  Updated Vital Signs BP (!) 158/97 (BP Location: Right Arm)   Pulse (!) 114   Temp 98.5 F (36.9 C) (Oral)   Resp 20   LMP 04/11/2012 Comment: ablation  SpO2 99%   Visual Acuity Right Eye Distance:   Left Eye Distance:   Bilateral Distance:    Right Eye Near:   Left Eye Near:    Bilateral Near:     Physical Exam Vitals and nursing note  reviewed.  Constitutional:      General: She is not in acute distress.    Appearance: She is well-developed.  HENT:     Head: Normocephalic and atraumatic.  Eyes:     Conjunctiva/sclera: Conjunctivae normal.  Cardiovascular:     Rate and Rhythm: Normal rate and regular rhythm.     Heart sounds: No murmur heard. Pulmonary:     Effort: Pulmonary effort is normal. No respiratory distress.     Breath sounds: Normal breath sounds.  Abdominal:     Palpations: Abdomen is soft.     Tenderness: There is no abdominal tenderness.  Musculoskeletal:        General: No swelling.     Right wrist: Laceration and tenderness present. No swelling or deformity.  Normal range of motion. Normal pulse.     Cervical back: Neck supple.     Comments: Right wrist and hand with sensation intact, motor function grossly intact  Skin:    General: Skin is warm and dry.     Capillary Refill: Capillary refill takes less than 2 seconds.     Comments: 1 cm shallow laceration along the anterior wrist.  Neurological:     Mental Status: She is alert.  Psychiatric:        Mood and Affect: Mood normal.      UC Treatments / Results  Labs (all labs ordered are listed, but only abnormal results are displayed) Labs Reviewed - No data to display  EKG   Radiology No results found.  Procedures Laceration Repair  Date/Time: 09/28/2023 6:01 PM  Performed by: Landis Martins, PA-C Authorized by: Landis Martins, PA-C   Consent:    Consent obtained:  Verbal   Consent given by:  Patient   Risks discussed:  Infection, need for additional repair, pain, poor cosmetic result and poor wound healing   Alternatives discussed:  No treatment and delayed treatment Universal protocol:    Procedure explained and questions answered to patient or proxy's satisfaction: yes     Relevant documents present and verified: yes     Test results available: yes     Imaging studies available: yes     Required blood products,  implants, devices, and special equipment available: yes     Site/side marked: yes     Immediately prior to procedure, a time out was called: yes     Patient identity confirmed:  Verbally with patient Anesthesia:    Anesthesia method:  Local infiltration   Local anesthetic:  Lidocaine 1% WITH epi Laceration details:    Location:  Hand   Hand location:  R palm   Length (cm):  1 Pre-procedure details:    Preparation:  Patient was prepped and draped in usual sterile fashion Exploration:    Limited defect created (wound extended): no     Wound exploration: wound explored through full range of motion     Contaminated: yes   Treatment:    Area cleansed with:  Povidone-iodine   Amount of cleaning:  Standard   Irrigation solution:  Sterile saline   Irrigation method:  Syringe   Debridement:  None   Undermining:  None Skin repair:    Repair method:  Sutures   Suture size:  5-0   Suture material:  Prolene   Suture technique:  Simple interrupted Approximation:    Approximation:  Close Repair type:    Repair type:  Simple Post-procedure details:    Dressing:  Bulky dressing   Procedure completion:  Tolerated well, no immediate complications  (including critical care time)  Medications Ordered in UC Medications - No data to display  Initial Impression / Assessment and Plan / UC Course  I have reviewed the triage vital signs and the nursing notes.  Pertinent labs & imaging results that were available during my care of the patient were reviewed by me and considered in my medical decision making (see chart for details).     Laceration of right wrist, initial encounter  Laceration on the right anterior wrist. 3 Sutures placed. Instructions as follows:   Leave bandage in place for at least 12 hours, then may remove and wash the area with soap and water. Then cover with another bandage. May use ace for comfort. Do not submerge the  area until completely healed.  Suture should be  removed in 7-10 days. Can come here or PCP Doxycycline 100 mg twice daily for 4 days Diflucan 150 mg Take 1 tablet at first signs of yeast infection, then repeat in 3 days Return to urgent care or PCP if symptoms worsen or fail to res   Final Clinical Impressions(s) / UC Diagnoses   Final diagnoses:  Laceration of right wrist, initial encounter     Discharge Instructions      Laceration on the right anterior wrist. 3 Sutures placed. Instructions as follows:   Leave bandage in place for at least 12 hours, then may remove and wash the area with soap and water. Then cover with another bandage. May use ace for comfort. Do not submerge the area until completely healed.  Suture should be removed in 7-10 days. Can come here or PCP Doxycycline 100 mg twice daily for 4 days Diflucan 150 mg Take 1 tablet at first signs of yeast infection, then repeat in 3 days Return to urgent care or PCP if symptoms worsen or fail to resolve.     ED Prescriptions     Medication Sig Dispense Auth. Provider   doxycycline (VIBRAMYCIN) 100 MG capsule Take 1 capsule (100 mg total) by mouth 2 (two) times daily for 4 days. 8 capsule Sharah Finnell A, PA-C   fluconazole (DIFLUCAN) 150 MG tablet Take 1 tablet (150 mg total) by mouth daily. Take 1 tablet at first signs of yeast infection, then repeat in 3 days 2 tablet Landis Martins, PA-C      PDMP not reviewed this encounter.   Landis Martins, [place] 09/28/23 [phone]

## 2023-09-28 NOTE — ED Triage Notes (Signed)
Pt stabbed wrist with arts and craft scissors and cut right wrist. Tried bleed stop but didn't work. Pt has bandaged upon triage.

## 2023-09-29 ENCOUNTER — Ambulatory Visit: Payer: 59 | Admitting: Internal Medicine

## 2023-10-15 ENCOUNTER — Encounter: Payer: Self-pay | Admitting: Internal Medicine

## 2023-10-15 ENCOUNTER — Ambulatory Visit: Payer: 59 | Admitting: Internal Medicine

## 2023-10-15 VITALS — BP 126/80 | HR 102 | Temp 98.3°F | Ht 64.0 in | Wt 208.0 lb

## 2023-10-15 DIAGNOSIS — F419 Anxiety disorder, unspecified: Secondary | ICD-10-CM | POA: Diagnosis not present

## 2023-10-15 DIAGNOSIS — F909 Attention-deficit hyperactivity disorder, unspecified type: Secondary | ICD-10-CM | POA: Diagnosis not present

## 2023-10-15 DIAGNOSIS — M79641 Pain in right hand: Secondary | ICD-10-CM

## 2023-10-15 DIAGNOSIS — M5440 Lumbago with sciatica, unspecified side: Secondary | ICD-10-CM | POA: Diagnosis not present

## 2023-10-15 DIAGNOSIS — G8929 Other chronic pain: Secondary | ICD-10-CM

## 2023-10-15 MED ORDER — HYDROCODONE-ACETAMINOPHEN 10-325 MG PO TABS: 1.0000 | ORAL_TABLET | Freq: Three times a day (TID) | ORAL | 0 refills | Status: DC | PRN

## 2023-10-15 MED ORDER — VYVANSE 50 MG PO CAPS: 50.0000 mg | ORAL_CAPSULE | ORAL | 0 refills | Status: DC

## 2023-10-15 MED ORDER — HYDROCODONE-ACETAMINOPHEN 10-325 MG PO TABS: 1.0000 | ORAL_TABLET | Freq: Four times a day (QID) | ORAL | 0 refills | Status: DC | PRN

## 2023-10-15 NOTE — Assessment & Plan Note (Signed)
 R hand accidental injury w/scissors - 09/28/23, had 3 stitches, still very painful; probably nicked the ligament Hand surgery ref offered Blue-Emu cream was recommended to use 2-3 times a day Wrist splint

## 2023-10-15 NOTE — Assessment & Plan Note (Signed)
 Smoking less MSK 2015 Cont on Norco 10/325 po qid prn w/caution  Potential benefits of a long term opioids use as well as potential risks (i.e. addiction risk, apnea etc) and complications (i.e. Somnolence, constipation and others) were explained to the patient and were aknowledged. Gabapentin  prn - d/c

## 2023-10-15 NOTE — Assessment & Plan Note (Signed)
On Pristique

## 2023-10-15 NOTE — Patient Instructions (Signed)
 Blue-Emu cream - use 2-3 times a day Wrist splint
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
## 2023-10-15 NOTE — Assessment & Plan Note (Signed)
On Rx -Vyvance  Potential benefits of a long term stimulant use as well as potential risks  and complications were explained to the patient and were aknowledged.

## 2023-12-05 ENCOUNTER — Telehealth: Payer: Self-pay | Admitting: Internal Medicine

## 2023-12-05 ENCOUNTER — Ambulatory Visit: Payer: 59 | Admitting: Family Medicine

## 2023-12-05 ENCOUNTER — Encounter: Payer: Self-pay | Admitting: Family Medicine

## 2023-12-05 ENCOUNTER — Ambulatory Visit (INDEPENDENT_AMBULATORY_CARE_PROVIDER_SITE_OTHER): Payer: 59

## 2023-12-05 VITALS — BP 120/97 | HR 85 | Temp 98.4°F | Ht 64.0 in | Wt 216.6 lb

## 2023-12-05 DIAGNOSIS — R0781 Pleurodynia: Secondary | ICD-10-CM | POA: Diagnosis not present

## 2023-12-05 DIAGNOSIS — R051 Acute cough: Secondary | ICD-10-CM

## 2023-12-05 DIAGNOSIS — J111 Influenza due to unidentified influenza virus with other respiratory manifestations: Secondary | ICD-10-CM

## 2023-12-05 DIAGNOSIS — R509 Fever, unspecified: Secondary | ICD-10-CM | POA: Diagnosis not present

## 2023-12-05 LAB — POCT INFLUENZA A/B
Influenza A, POC: NEGATIVE
Influenza B, POC: NEGATIVE

## 2023-12-05 MED ORDER — OSELTAMIVIR PHOSPHATE 75 MG PO CAPS: 75.0000 mg | ORAL_CAPSULE | Freq: Two times a day (BID) | ORAL | 0 refills | Status: DC

## 2023-12-05 MED ORDER — HYDROCODONE BIT-HOMATROP MBR 5-1.5 MG/5ML PO SOLN
5.0000 mL | Freq: Three times a day (TID) | ORAL | 0 refills | Status: DC | PRN
Start: 2023-12-05 — End: 2023-12-08

## 2023-12-05 NOTE — Telephone Encounter (Signed)
 Patient was seen by Moshe Cipro today and was prescribed HYDROcodone bit-homatropine (HYCODAN) 5-1.5 MG/5ML syrup and oseltamivir (TAMIFLU) 75 MG capsule . She said they were sent to the wrong pharmacy. She would like for them to be sent to [facility] - [place], [place] - [address] instead. Best callback is [phone].

## 2023-12-05 NOTE — Patient Instructions (Signed)
 I have sent in tamiflu for you to take twice a day for 5 days.   I have sent in hydrocodone cough syrup for you to take 5 mL once daily in the evening as needed for cough.  This medication may make you sleepy.  Do not drive or operate heavy machinery while taking this medication.  We are getting an xray today. We will be in contact with any abnormal results that require further attention.  Follow-up with me for new or worsening symptoms.

## 2023-12-05 NOTE — Telephone Encounter (Signed)
 Pt has retracted her request, stated her husband will pick it up for her from [facility]

## 2023-12-05 NOTE — Telephone Encounter (Signed)
 Copied from CRM [id number]. Topic: Clinical - Prescription Issue >> Dec 05, 2023  1:57 PM Suzette B wrote: Reason for CRM: patient called stating that her prescription was sent to the incorrect pharmacy, I attempted to assist her with the corrections she demanded to speak with office, call CAL line to advise spoke with [name] who then stated she would send a message to the proper person to correct the mistake, patient begin yelling and I apologized for the inconvenience and was advised she wanted her meds, advised patient to check with pharmacy in about an hour, patient hung phone up in my face after collecting names of the party of I've spoken with as well as my name.

## 2023-12-05 NOTE — Progress Notes (Signed)
 Acute Office Visit  Subjective:     Patient ID: Tamara Cross, female    DOB: 18-Feb-1966, 58 y.o.   MRN: [id number]  Chief Complaint  Patient presents with   Acute Visit    Symptoms started on Tuesday, cough, body aches, fever at home was 101. Has tried NyQuil, Mucinex, and pain medications    HPI Patient is in today for evaluation of cough, rib pain, for the last 3-4 days. Pain worse with coughing Has tried Nyquil, ibuprofen, tylenol, mucinex with little relief. Husband with flu A Medical hx as outlined below.  ROS Per HPI      Objective:    BP (!) 120/97 (BP Location: Left Arm, Patient Position: Sitting)   Pulse 85   Temp 98.4 F (36.9 C) (Temporal)   Ht 5\' 4"  (1.626 m)   Wt 216 lb 9.6 oz (98.2 kg)   LMP 04/11/2012 Comment: ablation  SpO2 97%   BMI 37.18 kg/m    Physical Exam Vitals and nursing note reviewed.  Constitutional:      General: She is not in acute distress.    Appearance: She is ill-appearing.  HENT:     Head: Normocephalic and atraumatic.     Nose: No congestion.     Mouth/Throat:     Mouth: Mucous membranes are moist.     Pharynx: Oropharynx is clear. No oropharyngeal exudate or posterior oropharyngeal erythema.  Eyes:     Extraocular Movements: Extraocular movements intact.  Cardiovascular:     Rate and Rhythm: Normal rate and regular rhythm.     Heart sounds: Normal heart sounds.  Pulmonary:     Effort: Pulmonary effort is normal. No respiratory distress.     Breath sounds: Wheezing present. No rhonchi or rales.     Comments: Productive cough  Musculoskeletal:        General: Swelling and tenderness present.     Cervical back: Normal range of motion and neck supple.     Comments: Right lower ribs, no bruising, no obvious deformity  Lymphadenopathy:     Cervical: No cervical adenopathy.  Skin:    General: Skin is warm and dry.  Neurological:     General: No focal deficit present.     Mental Status: She is alert and oriented to  person, place, and time.    Results for orders placed or performed in visit on 12/05/23  POCT Influenza A/B  Result Value Ref Range   Influenza A, POC Negative Negative   Influenza B, POC Negative Negative        Assessment & Plan:  1. Acute cough (Primary)  - POCT Influenza A/B - DG Ribs Unilateral W/Chest Right; Future - HYDROcodone bit-homatropine (HYCODAN) 5-1.5 MG/5ML syrup; Take 5 mLs by mouth every 8 (eight) hours as needed for cough.  Dispense: 120 mL; Refill: 0  2. Fever, unspecified fever cause  - POCT Influenza A/B  3. Rib pain on right side  - DG Ribs Unilateral W/Chest Right; Future  4. Influenza-like illness  - oseltamivir (TAMIFLU) 75 MG capsule; Take 1 capsule (75 mg total) by mouth 2 (two) times daily for 5 days.  Dispense: 10 capsule; Refill: 0   Meds ordered this encounter  Medications   oseltamivir (TAMIFLU) 75 MG capsule    Sig: Take 1 capsule (75 mg total) by mouth 2 (two) times daily for 5 days.    Dispense:  10 capsule    Refill:  0   HYDROcodone bit-homatropine (HYCODAN) 5-1.5 MG/5ML  syrup    Sig: Take 5 mLs by mouth every 8 (eight) hours as needed for cough.    Dispense:  120 mL    Refill:  0    Return if symptoms worsen or fail to improve.  Moshe Cipro, FNP

## 2023-12-08 MED ORDER — HYDROCODONE BIT-HOMATROP MBR 5-1.5 MG/5ML PO SOLN
5.0000 mL | Freq: Three times a day (TID) | ORAL | 0 refills | Status: DC | PRN
Start: 2023-12-08 — End: 2024-04-26

## 2023-12-08 MED ORDER — OSELTAMIVIR PHOSPHATE 75 MG PO CAPS: 75.0000 mg | ORAL_CAPSULE | Freq: Two times a day (BID) | ORAL | 0 refills | Status: AC

## 2023-12-08 NOTE — Addendum Note (Signed)
 Addended by: Tresa Garter on: 12/08/2023 08:05 AM   Modules accepted: Orders

## 2023-12-08 NOTE — Telephone Encounter (Signed)
 Okay.  Done.  Thanks

## 2023-12-26 ENCOUNTER — Encounter: Payer: Self-pay | Admitting: Family Medicine

## 2024-03-02 ENCOUNTER — Other Ambulatory Visit: Payer: Self-pay | Admitting: Internal Medicine

## 2024-03-02 ENCOUNTER — Encounter: Payer: Self-pay | Admitting: Internal Medicine

## 2024-03-02 NOTE — Telephone Encounter (Signed)
 Copied from CRM [id number]. Topic: Clinical - Medication Refill >> Mar 02, 2024  2:14 PM Adonis Hoot wrote: Medication: VYVANSE  50 MG capsule   HYDROcodone -acetaminophen  (NORCO) 10-325 MG tablet   Has the patient contacted their pharmacy? no (Agent: If no, request that the patient contact the pharmacy for the refill. If patient does not wish to contact the pharmacy document the reason why and proceed with request.) (Agent: If yes, when and what did the pharmacy advise?)  This is the patient's preferred pharmacy:  [facility] - [place], [place] - [address] [place] [address] Phone: [phone] Fax: [phone]   Is this the correct pharmacy for this prescription? Yes If no, delete pharmacy and type the correct one.   Has the prescription been filled recently? No  Is the patient out of the medication? Yes  Has the patient been seen for an appointment in the last year OR does the patient have an upcoming appointment? Yes  Can we respond through MyChart? Yes  Agent: Please be advised that Rx refills may take up to 3 business days. We ask that you follow-up with your pharmacy.

## 2024-03-10 ENCOUNTER — Ambulatory Visit (INDEPENDENT_AMBULATORY_CARE_PROVIDER_SITE_OTHER)

## 2024-03-10 ENCOUNTER — Encounter: Payer: Self-pay | Admitting: Internal Medicine

## 2024-03-10 ENCOUNTER — Ambulatory Visit: Admitting: Internal Medicine

## 2024-03-10 VITALS — BP 128/70 | HR 104 | Temp 98.7°F | Ht 64.0 in | Wt 212.0 lb

## 2024-03-10 DIAGNOSIS — I341 Nonrheumatic mitral (valve) prolapse: Secondary | ICD-10-CM

## 2024-03-10 DIAGNOSIS — M79671 Pain in right foot: Secondary | ICD-10-CM

## 2024-03-10 DIAGNOSIS — F909 Attention-deficit hyperactivity disorder, unspecified type: Secondary | ICD-10-CM

## 2024-03-10 DIAGNOSIS — G8929 Other chronic pain: Secondary | ICD-10-CM

## 2024-03-10 DIAGNOSIS — F419 Anxiety disorder, unspecified: Secondary | ICD-10-CM

## 2024-03-10 DIAGNOSIS — M5440 Lumbago with sciatica, unspecified side: Secondary | ICD-10-CM | POA: Diagnosis not present

## 2024-03-10 MED ORDER — VYVANSE 50 MG PO CAPS: 50.0000 mg | ORAL_CAPSULE | Freq: Every day | ORAL | 0 refills | Status: DC

## 2024-03-10 MED ORDER — DESVENLAFAXINE SUCCINATE ER 100 MG PO TB24: 100.0000 mg | ORAL_TABLET | Freq: Every day | ORAL | 3 refills | Status: DC

## 2024-03-10 MED ORDER — ALBUTEROL SULFATE HFA 108 (90 BASE) MCG/ACT IN AERS: 1.0000 | INHALATION_SPRAY | Freq: Four times a day (QID) | RESPIRATORY_TRACT | 3 refills | Status: AC | PRN

## 2024-03-10 MED ORDER — BUDESONIDE-FORMOTEROL FUMARATE 160-4.5 MCG/ACT IN AERO: 2.0000 | INHALATION_SPRAY | Freq: Two times a day (BID) | RESPIRATORY_TRACT | 3 refills | Status: AC

## 2024-03-10 MED ORDER — HYDROCODONE-ACETAMINOPHEN 10-325 MG PO TABS: 1.0000 | ORAL_TABLET | Freq: Four times a day (QID) | ORAL | 0 refills | Status: DC | PRN

## 2024-03-10 NOTE — Assessment & Plan Note (Signed)
On Pristique

## 2024-03-10 NOTE — Assessment & Plan Note (Signed)
 Smoking less MSK 2015 Cont on Norco 10/325 po qid prn w/caution  Potential benefits of a long term opioids use as well as potential risks (i.e. addiction risk, apnea etc) and complications (i.e. Somnolence, constipation and others) were explained to the patient and were aknowledged. Gabapentin  prn - d/c

## 2024-03-10 NOTE — Progress Notes (Signed)
 Subjective:  Patient ID: Tamara Cross, female    DOB: [birth year]  Age: 58 y.o. MRN: [id number]  CC: Medical Management of Chronic Issues (Discuss dental procedures, rt inner foot and arch pain sometimes shooting pain )   HPI Tamara Cross presents for ADD, low back pain.  The patient is complaining of right foot pain that is new  Outpatient Medications Prior to Visit  Medication Sig Dispense Refill   acetaminophen  (TYLENOL ) 500 MG tablet Take 1,000 mg by mouth every 6 (six) hours as needed for headache.     amoxicillin  (AMOXIL ) 875 MG tablet Take 875 mg by mouth 2 (two) times daily.     cetirizine (ZYRTEC) 10 MG tablet Take 10 mg by mouth daily as needed for allergies.     clotrimazole -betamethasone  (LOTRISONE ) cream Apply 1 Application topically daily. 90 g 1   cyclobenzaprine  (FLEXERIL ) 10 MG tablet Take 1 tablet (10 mg total) by mouth 3 (three) times daily as needed for muscle spasms. 90 tablet 1   fluconazole  (DIFLUCAN ) 150 MG tablet Take 1 tablet (150 mg total) by mouth daily. Take 1 tablet at first signs of yeast infection, then repeat in 3 days 2 tablet 0   gabapentin  (NEURONTIN ) 300 MG capsule TAKE 1 CAPSULE(300 MG) BY MOUTH TWICE DAILY AS NEEDED 60 capsule 3   HYDROcodone  bit-homatropine (HYCODAN) 5-1.5 MG/5ML syrup Take 5 mLs by mouth every 8 (eight) hours as needed for cough. 120 mL 0   HYDROcodone -acetaminophen  (NORCO) 10-325 MG tablet Take 1 tablet by mouth 3 (three) times daily as needed for severe pain (pain score 7-10). 90 tablet 0   ibuprofen  (ADVIL ) 200 MG tablet Take 400 mg by mouth every 6 (six) hours as needed (Back pain).     lisdexamfetamine (VYVANSE ) 50 MG capsule Take 1 capsule (50 mg total) by mouth daily. 30 capsule 0   metoprolol  succinate (TOPROL -XL) 50 MG 24 hr tablet Take 1 tablet (50 mg total) by mouth daily. Take with or immediately following a meal. 30 tablet 11   pantoprazole  (PROTONIX ) 40 MG tablet Take 1 tablet (40 mg total) by mouth 2 (two) times  daily. 60 tablet 5   predniSONE  (DELTASONE ) 10 MG tablet Take 1 tablet (10 mg total) by mouth daily with breakfast. Take pc. 30 tablet 0   Soft Lens Products (SENSITIVE EYES SALINE) SOLN Place 1 drop into both eyes daily. Saline eye lub     valACYclovir  (VALTREX ) 500 MG tablet Take 1 tablet (500 mg total) by mouth daily. Increase to bid prn with outbreak 180 tablet 0   VYVANSE  50 MG capsule Take 1 capsule (50 mg total) by mouth every morning. 30 capsule 0   albuterol  (VENTOLIN  HFA) 108 (90 Base) MCG/ACT inhaler Inhale 1-2 puffs into the lungs every 6 (six) hours as needed for wheezing or shortness of breath. 3 Inhaler 3   budesonide -formoterol  (SYMBICORT ) 160-4.5 MCG/ACT inhaler Inhale 2 puffs into the lungs 2 (two) times daily. (Patient taking differently: Inhale 2 puffs into the lungs 3 (three) times a week.) 3 Inhaler 3   desvenlafaxine  (PRISTIQ ) 100 MG 24 hr tablet Take 1 tablet (100 mg total) by mouth daily. 90 tablet 3   HYDROcodone -acetaminophen  (NORCO) 10-325 MG tablet Take 1 tablet by mouth every 6 (six) hours as needed for severe pain (pain score 7-10). Please fill on or after 10/27/23 90 tablet 0   VYVANSE  50 MG capsule Take 1 capsule (50 mg total) by mouth daily. Please fill on or after 03/03/24 30  capsule 0   Facility-Administered Medications Prior to Visit  Medication Dose Route Frequency Provider Last Rate Last Admin   gadopentetate dimeglumine  (MAGNEVIST ) injection 18 mL  18 mL Intravenous Once PRN Penumalli, Vikram R, MD        ROS: Review of Systems  Constitutional:  Negative for activity change, appetite change, chills, fatigue and unexpected weight change.  HENT:  Negative for congestion, mouth sores and sinus pressure.   Eyes:  Negative for visual disturbance.  Respiratory:  Negative for cough and chest tightness.   Gastrointestinal:  Negative for abdominal pain and nausea.  Genitourinary:  Negative for difficulty urinating, frequency and vaginal pain.  Musculoskeletal:   Positive for arthralgias, back pain and gait problem.  Skin:  Negative for pallor and rash.  Neurological:  Negative for dizziness, tremors, weakness, numbness and headaches.  Psychiatric/Behavioral:  Negative for confusion, sleep disturbance and suicidal ideas.     Objective:  BP 128/70   Pulse (!) 104   Temp 98.7 F (37.1 C) (Oral)   Ht 5' 4 (1.626 m)   Wt 212 lb (96.2 kg)   LMP 04/11/2012 Comment: ablation  SpO2 97%   BMI 36.39 kg/m   BP Readings from Last 3 Encounters:  03/10/24 128/70  12/05/23 (!) 120/97  10/15/23 126/80    Wt Readings from Last 3 Encounters:  03/10/24 212 lb (96.2 kg)  12/05/23 216 lb 9.6 oz (98.2 kg)  10/15/23 208 lb (94.3 kg)    Physical Exam Constitutional:      General: She is not in acute distress.    Appearance: She is well-developed.  HENT:     Head: Normocephalic.     Right Ear: External ear normal.     Left Ear: External ear normal.     Nose: Nose normal.   Eyes:     General:        Right eye: No discharge.        Left eye: No discharge.     Conjunctiva/sclera: Conjunctivae normal.     Pupils: Pupils are equal, round, and reactive to light.   Neck:     Thyroid : No thyromegaly.     Vascular: No JVD.     Trachea: No tracheal deviation.   Cardiovascular:     Rate and Rhythm: Normal rate and regular rhythm.     Heart sounds: Normal heart sounds.  Pulmonary:     Effort: No respiratory distress.     Breath sounds: No stridor. No wheezing.  Abdominal:     General: Bowel sounds are normal. There is no distension.     Palpations: Abdomen is soft. There is no mass.     Tenderness: There is no abdominal tenderness. There is no guarding or rebound.   Musculoskeletal:        General: No tenderness.     Cervical back: Normal range of motion and neck supple. No rigidity.  Lymphadenopathy:     Cervical: No cervical adenopathy.   Skin:    Findings: No erythema or rash.   Neurological:     Cranial Nerves: No cranial nerve  deficit.     Motor: No abnormal muscle tone.     Coordination: Coordination normal.     Deep Tendon Reflexes: Reflexes normal.   Psychiatric:        Behavior: Behavior normal.        Thought Content: Thought content normal.        Judgment: Judgment normal.    Lumbar spine is tender  Right midfoot is tender  Lab Results  Component Value Date   WBC 10.1 08/07/2022   HGB 14.4 08/07/2022   HCT 42.9 08/07/2022   PLT 244.0 08/07/2022   GLUCOSE 86 08/07/2022   CHOL 224 (H) 08/07/2022   TRIG 79.0 08/07/2022   HDL 65.60 08/07/2022   LDLCALC 142 (H) 08/07/2022   ALT 19 08/07/2022   AST 17 08/07/2022   NA 140 08/07/2022   K 3.7 08/07/2022   CL 101 08/07/2022   CREATININE 0.65 08/07/2022   BUN 12 08/07/2022   CO2 32 08/07/2022   TSH 4.86 11/29/2022   HGBA1C 6.2 08/07/2022    No results found.  Assessment & Plan:   Problem List Items Addressed This Visit     Anxiety disorder   On Pristique      Relevant Medications   desvenlafaxine  (PRISTIQ ) 100 MG 24 hr tablet   Low back pain - Primary   Smoking less MSK 2015 Cont on Norco 10/325 po qid prn w/caution  Potential benefits of a long term opioids use as well as potential risks (i.e. addiction risk, apnea etc) and complications (i.e. Somnolence, constipation and others) were explained to the patient and were aknowledged. Gabapentin  prn - d/c      Relevant Medications   HYDROcodone -acetaminophen  (NORCO) 10-325 MG tablet   ADHD (attention deficit hyperactivity disorder)   On Rx -Vyvance  Potential benefits of a long term stimulant use as well as potential risks  and complications were explained to the patient and were aknowledged.      MVP (mitral valve prolapse)   No need for antibiotics for dental cleaning/dental procedures      Foot pain, right   Relevant Orders   DG Foot Complete Right (Completed)      Meds ordered this encounter  Medications   albuterol  (VENTOLIN  HFA) 108 (90 Base) MCG/ACT inhaler     Sig: Inhale 1-2 puffs into the lungs every 6 (six) hours as needed for wheezing or shortness of breath.    Dispense:  3 each    Refill:  3   budesonide -formoterol  (SYMBICORT ) 160-4.5 MCG/ACT inhaler    Sig: Inhale 2 puffs into the lungs 2 (two) times daily.    Dispense:  3 each    Refill:  3   HYDROcodone -acetaminophen  (NORCO) 10-325 MG tablet    Sig: Take 1 tablet by mouth every 6 (six) hours as needed for severe pain (pain score 7-10). Please fill on or after 04/02/24    Dispense:  90 tablet    Refill:  0   VYVANSE  50 MG capsule    Sig: Take 1 capsule (50 mg total) by mouth daily. Please fill on or after 04/02/24    Dispense:  30 capsule    Refill:  0   desvenlafaxine  (PRISTIQ ) 100 MG 24 hr tablet    Sig: Take 1 tablet (100 mg total) by mouth daily.    Dispense:  90 tablet    Refill:  3      Follow-up: Return in about 3 months (around 06/10/2024) for a follow-up visit.  Anitra Barn, MD

## 2024-03-10 NOTE — Assessment & Plan Note (Signed)
 No need for antibiotics for dental cleaning/dental procedures

## 2024-03-10 NOTE — Assessment & Plan Note (Signed)
On Rx -Vyvance  Potential benefits of a long term stimulant use as well as potential risks  and complications were explained to the patient and were aknowledged.

## 2024-03-10 NOTE — Patient Instructions (Signed)
 No need for antibiotics for dental cleaning/dental procedures

## 2024-03-14 ENCOUNTER — Ambulatory Visit: Payer: Self-pay | Admitting: Internal Medicine

## 2024-03-22 ENCOUNTER — Other Ambulatory Visit: Payer: Self-pay | Admitting: Internal Medicine

## 2024-03-22 DIAGNOSIS — M79671 Pain in right foot: Secondary | ICD-10-CM

## 2024-04-05 ENCOUNTER — Ambulatory Visit (INDEPENDENT_AMBULATORY_CARE_PROVIDER_SITE_OTHER): Admitting: Podiatry

## 2024-04-05 DIAGNOSIS — Z91199 Patient's noncompliance with other medical treatment and regimen due to unspecified reason: Secondary | ICD-10-CM

## 2024-04-05 NOTE — Progress Notes (Signed)
 No show

## 2024-04-19 ENCOUNTER — Ambulatory Visit
Admission: EM | Admit: 2024-04-19 | Discharge: 2024-04-19 | Disposition: A | Discharge status: Home / Self Care | Attending: Physician Assistant | Admitting: Physician Assistant

## 2024-04-19 DIAGNOSIS — M25462 Effusion, left knee: Secondary | ICD-10-CM

## 2024-04-19 DIAGNOSIS — M25562 Pain in left knee: Secondary | ICD-10-CM | POA: Diagnosis not present

## 2024-04-19 MED ORDER — DOXYCYCLINE HYCLATE 100 MG PO CAPS: 100.0000 mg | ORAL_CAPSULE | Freq: Two times a day (BID) | ORAL | 0 refills | Status: DC

## 2024-04-19 MED ORDER — PREDNISONE 20 MG PO TABS
40.0000 mg | ORAL_TABLET | Freq: Every day | ORAL | 0 refills | Status: AC
Start: 2024-04-19 — End: 2024-04-24

## 2024-04-19 NOTE — ED Triage Notes (Signed)
 My left knee is hot, warm to touch, I woke up with it feeling painful, swollen, no injury known.

## 2024-04-20 NOTE — ED Provider Notes (Signed)
 [facility]    CSN: [id number] Arrival date & time: 04/19/24  1329      History   Chief Complaint Chief Complaint  Patient presents with   Knee Swelling/Pain    HPI Tamara Cross is a 58 y.o. female.   Patient presents today for evaluation of left knee swelling and warmth.  She reports that she woke up with pain in her knee.  She denies any known injury.  She does not have history of gout but has questionable history of rheumatoid arthritis that is a recent diagnosis.  She has not had any fever.  The history is provided by the patient.    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 -   Complication of anesthesia    last 2 surgeries had some tachycardia   Depression    Dysrhythmia    tachy occ   Esophageal stricture    GERD (gastroesophageal reflux disease)    H/O hiatal hernia    Heart murmur 1990s   MVP   IBS (irritable bowel syndrome)    Migraine    migrains are rare   MVP (mitral valve prolapse)    Osteoarthritis    Peptic ulcer disease    PONV (postoperative nausea and vomiting)    S/P radiation therapy  06/28/2014-08/08/2014    1) Right Breast / 50 Gy in 25 fractions/ 2) Right Breast Boost / 10 Gy in 5 fractions   Skin cancer    basal cell     Patient Active Problem List   Diagnosis Date Noted   Foot pain, right 03/10/2024   MVP (mitral valve prolapse)    Hand pain, right 10/15/2023   Grief 06/30/2023   Tobacco use disorder 03/11/2023   Dysuria 09/26/2022   Fall (on) (from) other stairs and steps, initial encounter 09/26/2022   Abnormal TSH 08/10/2022   Swimmer's ear 08/07/2022   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   Hyperglycemia 03/19/2022   RUQ abdominal pain 09/26/2021   Nausea & vomiting 09/26/2021   Acute cough 07/20/2021   Shortness of breath 07/20/2021    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   Lipoma 09/25/2020   ADHD (attention deficit hyperactivity disorder) 07/12/2019   Back spasm 02/22/2019   S/P lumbar laminectomy 07/29/2018   Chronic lumbar radiculopathy 05/14/2018   Knee osteoarthritis 02/04/2018   HNP (herniated nucleus pulposus), lumbar 11/14/2017   Sciatic leg pain 10/21/2017   Sinusitis, chronic 10/15/2016   Asthmatic bronchitis 04/03/2016   Viral URI with cough 12/20/2015   Ataxia 05/30/2015   Liver lesion, left lobe 01/03/2015   Gallstone pancreatitis 11/28/2014   Dysphagia, pharyngoesophageal phase 10/06/2014   Special screening for malignant neoplasm of colon 10/06/2014   Dysphagia    Special screening for malignant neoplasms, colon    Arthralgia 09/12/2014   Submuscular lipoma of chest 09/12/2014   Leg pain 08/29/2014   Hot flashes due to tamoxifen  08/29/2014   Low back pain 04/26/2014   Rash and nonspecific skin eruption 04/26/2014   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   Palpitations 11/18/2013   Allergy    Heart murmur    HIP PAIN 09/07/2010   PEDAL EDEMA 04/26/2010   EXTERNAL HEMORRHOIDS 01/24/2010   HIATAL  HERNIA 01/24/2010   GERD 12/19/2009   Irritable bowel syndrome 12/19/2009   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   Migraine 08/23/2008   ESOPHAGEAL STRICTURE 03/11/2008   POLYARTHRALGIA 03/11/2008   Obesity 05/01/2007   Anxiety disorder 05/01/2007   Depression 05/01/2007   PREMATURE VENTRICULAR CONTRACTIONS 05/01/2007   Allergic rhinitis 05/01/2007   MITRAL VALVE PROLAPSE, HX OF 05/01/2007   PUD, HX OF 05/01/2007    Past Surgical History:  Procedure Laterality Date   APPENDECTOMY     BREAST LUMPECTOMY Bilateral    BREAST LUMPECTOMY WITH NEEDLE LOCALIZATION Bilateral 05/02/2014   Procedure: BILATERIAL BREAST LUMPECTOMY WITH NEEDLE LOCALIZATION;  Surgeon: Alm VEAR Angle, MD;  Location: [facility];  Service: General;  Laterality: Bilateral;   CERVIX LESION DESTRUCTION      CESAREAN SECTION  2002   CHOLECYSTECTOMY  11/29/2014   lap chole    CHOLECYSTECTOMY N/A 11/29/2014   Procedure: LAPAROSCOPIC CHOLECYSTECTOMY WITH INTRAOPERATIVE CHOLANGIOGRAM;  Surgeon: Gordy Pina, MD;  Location: [facility] OR;  Service: General;  Laterality: N/A;   COLONOSCOPY N/A 10/06/2014   Procedure: COLONOSCOPY;  Surgeon: Princella CHRISTELLA Nida, MD;  Location: [facility];  Service: Endoscopy;  Laterality: N/A;   COLPOSCOPY     CYSTECTOMY     left breast,in 20's   DILATION AND CURETTAGE OF UTERUS     x2   ESOPHAGOGASTRODUODENOSCOPY N/A 10/06/2014   Procedure: ESOPHAGOGASTRODUODENOSCOPY (EGD);  Surgeon: Princella CHRISTELLA Nida, MD;  Location: [facility];  Service: Endoscopy;  Laterality: N/A;   HAND SURGERY     cancer removed basal cell   LIPOMA EXCISION Right 11/21/2020   Procedure: REMOVAL OF RIGHT BACK LIPOMA;  Surgeon: Gladis Cough, MD;  Location: [facility];  Service: General;  Laterality: Right;  ROOM 2 STARTING AT 10:30AM   LUMBAR LAMINECTOMY/DECOMPRESSION MICRODISCECTOMY Left 11/14/2017   Procedure: DISCECTOMY LUMBAR Five - SACRAL - one, LEFT;  Surgeon: Gillie Duncans, MD;  Location: [facility];  Service: Neurosurgery;  Laterality: Left;  MICRODISCECTOMY LUMBAR 5- SACRAL 1, LEFT   LUMBAR LAMINECTOMY/DECOMPRESSION MICRODISCECTOMY Left 01/30/2018   Procedure: MICRODISCECTOMY LUMBAR 4- LUMBAR 5 LEFT;  Surgeon: Gillie Duncans, MD;  Location: [facility];  Service: Neurosurgery;  Laterality: Left;  MICRODISCECTOMY LUMBAR 4- LUMBAR 5 LEFT   NOVASURE ABLATION     SHOULDER SURGERY Left    debridement ,bone spurs   TONSILLECTOMY      OB History     Gravida  1   Para  1   Term      Preterm      AB      Living         SAB      IAB      Ectopic      Multiple      Live Births           Obstetric Comments  She is not on hormones.  She had her last menstrual period around 2013.          Home Medications    Prior to Admission medications   Medication Sig Start Date End Date Taking? Authorizing  Provider  doxycycline  (VIBRAMYCIN ) 100 MG capsule Take 1 capsule (100 mg total) by mouth 2 (two) times daily for 7 days. 04/19/24 04/26/24 Yes Billy Asberry FALCON, PA-C  predniSONE  (DELTASONE ) 20 MG tablet Take 2 tablets (40 mg total) by mouth daily with breakfast for 5 days. 04/19/24 04/24/24 Yes Billy Asberry FALCON, PA-C  acetaminophen  (TYLENOL ) 500 MG tablet Take 1,000 mg by mouth every 6 (six) hours as needed  for headache.    [provider]  albuterol  (VENTOLIN  HFA) 108 (90 Base) MCG/ACT inhaler Inhale 1-2 puffs into the lungs every 6 (six) hours as needed for wheezing or shortness of breath. 03/10/24   Plotnikov, Aleksei V, MD  amoxicillin  (AMOXIL ) 875 MG tablet Take 875 mg by mouth 2 (two) times daily. 03/09/24   [provider]  budesonide -formoterol  (SYMBICORT ) 160-4.5 MCG/ACT inhaler Inhale 2 puffs into the lungs 2 (two) times daily. 03/10/24   Plotnikov, Aleksei V, MD  cetirizine (ZYRTEC) 10 MG tablet Take 10 mg by mouth daily as needed for allergies.    [provider]  clotrimazole -betamethasone  (LOTRISONE ) cream Apply 1 Application topically daily. 03/11/23   Plotnikov, Aleksei V, MD  cyclobenzaprine  (FLEXERIL ) 10 MG tablet Take 1 tablet (10 mg total) by mouth 3 (three) times daily as needed for muscle spasms. 01/09/23   Plotnikov, Aleksei V, MD  desvenlafaxine  (PRISTIQ ) 100 MG 24 hr tablet Take 1 tablet (100 mg total) by mouth daily. 03/10/24   Plotnikov, Aleksei V, MD  fluconazole  (DIFLUCAN ) 150 MG tablet Take 1 tablet (150 mg total) by mouth daily. Take 1 tablet at first signs of yeast infection, then repeat in 3 days 09/28/23   Teresa Almarie LABOR, PA-C  gabapentin  (NEURONTIN ) 300 MG capsule TAKE 1 CAPSULE(300 MG) BY MOUTH TWICE DAILY AS NEEDED 06/30/23   Plotnikov, Aleksei V, MD  HYDROcodone  bit-homatropine (HYCODAN) 5-1.5 MG/5ML syrup Take 5 mLs by mouth every 8 (eight) hours as needed for cough. 12/08/23   Plotnikov, Aleksei V, MD  HYDROcodone -acetaminophen  (NORCO) 10-325 MG  tablet Take 1 tablet by mouth 3 (three) times daily as needed for severe pain (pain score 7-10). 03/03/24   Plotnikov, Aleksei V, MD  HYDROcodone -acetaminophen  (NORCO) 10-325 MG tablet Take 1 tablet by mouth every 6 (six) hours as needed for severe pain (pain score 7-10). Please fill on or after 04/02/24 03/10/24   Plotnikov, Karlynn GAILS, MD  ibuprofen  (ADVIL ) 200 MG tablet Take 400 mg by mouth every 6 (six) hours as needed (Back pain).    [provider]  lisdexamfetamine (VYVANSE ) 50 MG capsule Take 1 capsule (50 mg total) by mouth daily. 03/11/23   Plotnikov, Aleksei V, MD  metoprolol  succinate (TOPROL -XL) 50 MG 24 hr tablet Take 1 tablet (50 mg total) by mouth daily. Take with or immediately following a meal. 11/14/22   Plotnikov, Aleksei V, MD  pantoprazole  (PROTONIX ) 40 MG tablet Take 1 tablet (40 mg total) by mouth 2 (two) times daily. 09/26/21   Plotnikov, Aleksei V, MD  Soft Lens Products (SENSITIVE EYES SALINE) SOLN Place 1 drop into both eyes daily. Saline eye lub    [provider]  valACYclovir  (VALTREX ) 500 MG tablet Take 1 tablet (500 mg total) by mouth daily. Increase to bid prn with outbreak 03/11/23   Plotnikov, Karlynn GAILS, MD  VYVANSE  50 MG capsule Take 1 capsule (50 mg total) by mouth every morning. 10/15/23   Plotnikov, Aleksei V, MD  VYVANSE  50 MG capsule Take 1 capsule (50 mg total) by mouth daily. Please fill on or after 04/02/24 03/10/24   Plotnikov, Karlynn GAILS, MD    Family History Family History  Problem Relation Age of Onset   Heart disease Father    Hypertension Father    Colon cancer Maternal Grandmother    Depression Brother    Diabetes Maternal Grandfather    Heart disease Maternal Grandfather    Colon cancer Maternal Uncle        x 2  Colon polyps Other        x 9 maternal uncles/aunts   Breast cancer Neg Hx     Social History Social History   Tobacco Use   Smoking status: Some Days    Current packs/day: 0.50    Average packs/day: 0.5 packs/day for  26.2 years (13.1 ttl pk-yrs)    Types: Cigarettes    Start date: 02/23/1998   Smokeless tobacco: Never  Vaping Use   Vaping status: Never Used  Substance Use Topics   Alcohol use: Yes    Alcohol/week: 5.0 standard drinks of alcohol    Types: 5 Glasses of wine per week    Comment: socially   Drug use: No    Comment: quit for 15 yrs smoked for 15 started again 1 yr     Allergies   Imitrex  [sumatriptan ], Sulfonamide derivatives, Reglan [metoclopramide], Zofran  [ondansetron  hcl], Miconazole, Sulfamethoxazole-trimethoprim, and Hibiclens  [chlorhexidine ]   Review of Systems Review of Systems  Constitutional:  Negative for chills and fever.  Eyes:  Negative for discharge and redness.  Respiratory:  Negative for shortness of breath.   Gastrointestinal:  Negative for abdominal pain, nausea and vomiting.  Musculoskeletal:  Positive for arthralgias and joint swelling.  Skin:  Positive for color change. Negative for wound.  Neurological:  Negative for numbness.     Physical Exam Triage Vital Signs ED Triage Vitals  Encounter Vitals Group     BP 04/19/24 1402 104/67     Girls Systolic BP Percentile --      Girls Diastolic BP Percentile --      Boys Systolic BP Percentile --      Boys Diastolic BP Percentile --      Pulse Rate 04/19/24 1402 84     Resp 04/19/24 1402 18     Temp 04/19/24 1402 98.7 F (37.1 C)     Temp Source 04/19/24 1402 Oral     SpO2 04/19/24 1402 94 %     Weight 04/19/24 1359 212 lb 1.3 oz (96.2 kg)     Height 04/19/24 1359 5' 4 (1.626 m)     Head Circumference --      Peak Flow --      Pain Score 04/19/24 1357 10     Pain Loc --      Pain Education --      Exclude from Growth Chart --    No data found.  Updated Vital Signs BP 104/67 (BP Location: Left Arm)   Pulse 84   Temp 98.7 F (37.1 C) (Oral)   Resp 18   Ht 5' 4 (1.626 m)   Wt 212 lb 1.3 oz (96.2 kg)   LMP 04/11/2012 Comment: ablation  SpO2 94%   BMI 36.40 kg/m   Visual Acuity Right  Eye Distance:   Left Eye Distance:   Bilateral Distance:    Right Eye Near:   Left Eye Near:    Bilateral Near:     Physical Exam Vitals and nursing note reviewed.  Constitutional:      General: She is not in acute distress.    Appearance: Normal appearance. She is not ill-appearing.  HENT:     Head: Normocephalic and atraumatic.  Eyes:     Conjunctiva/sclera: Conjunctivae normal.  Cardiovascular:     Rate and Rhythm: Normal rate.  Pulmonary:     Effort: Pulmonary effort is normal.  Musculoskeletal:     Comments: Diffuse swelling with mild erythema present to left knee.  Tenderness to palpation  to anterior knee diffusely.  Decreased range of motion due to pain and swelling.  No swelling or erythema to left lower leg.  Neurological:     Mental Status: She is alert.  Psychiatric:        Mood and Affect: Mood normal.        Behavior: Behavior normal.        Thought Content: Thought content normal.      UC Treatments / Results  Labs (all labs ordered are listed, but only abnormal results are displayed) Labs Reviewed - No data to display  EKG   Radiology No results found.  Procedures Procedures (including critical care time)  Medications Ordered in UC Medications - No data to display  Initial Impression / Assessment and Plan / UC Course  I have reviewed the triage vital signs and the nursing notes.  Pertinent labs & imaging results that were available during my care of the patient were reviewed by me and considered in my medical decision making (see chart for details).    Unclear etiology of symptoms.  Will treat to cover both inflammatory and infectious cause of symptoms with steroid burst and antibiotics.  Advised follow-up in the emergency room with any worsening symptoms or if symptoms fail to improve as she may need advanced imaging.  Patient expressed understanding.  Final Clinical Impressions(s) / UC Diagnoses   Final diagnoses:  Pain and swelling of  left knee   Discharge Instructions   None    ED Prescriptions     Medication Sig Dispense Auth. Provider   predniSONE  (DELTASONE ) 20 MG tablet Take 2 tablets (40 mg total) by mouth daily with breakfast for 5 days. 10 tablet Billy Stabs F, PA-C   doxycycline  (VIBRAMYCIN ) 100 MG capsule Take 1 capsule (100 mg total) by mouth 2 (two) times daily for 7 days. 14 capsule Billy Stabs FALCON, PA-C      PDMP not reviewed this encounter.   Billy Stabs FALCON, PA-C 04/20/24 1304

## 2024-04-26 ENCOUNTER — Ambulatory Visit: Payer: Self-pay

## 2024-04-26 ENCOUNTER — Encounter: Payer: Self-pay | Admitting: Family Medicine

## 2024-04-26 ENCOUNTER — Ambulatory Visit: Admitting: Family Medicine

## 2024-04-26 VITALS — BP 108/62 | HR 85 | Temp 98.4°F | Resp 20 | Ht 64.0 in | Wt 213.0 lb

## 2024-04-26 DIAGNOSIS — M71162 Other infective bursitis, left knee: Secondary | ICD-10-CM

## 2024-04-26 NOTE — Progress Notes (Signed)
 Assessment & Plan:  1. Infected prepatellar bursa, left (Primary) Education provided on bursitis.  - Ambulatory referral to Sports Medicine   Follow up plan: Return if symptoms worsen or fail to improve.  Niki Rung, MSN, APRN, FNP-C  Subjective:  HPI: Tamara Cross is a 58 y.o. female presenting on 04/26/2024 for Knee Pain (Left knee - Last weekend this started while doing yardwork. Thinks she may have been bitten by something. Urgent Care on 7/14, gave prednisone , Doxy. Swelling, hot, and painful. )  Patient reports left knee pain that started over a week ago while she was doing yard work.  She thinks she may have been bitten by something.  She went to urgent care on 04/19/2024 where she was treated with prednisone  and doxycycline . Denies any improvement in her symptoms.   ROS: Negative unless specifically indicated above in HPI.   Relevant past medical history reviewed and updated as indicated.   Allergies and medications reviewed and updated.   Current Outpatient Medications:    acetaminophen  (TYLENOL ) 500 MG tablet, Take 1,000 mg by mouth every 6 (six) hours as needed for headache., Disp: , Rfl:    albuterol  (VENTOLIN  HFA) 108 (90 Base) MCG/ACT inhaler, Inhale 1-2 puffs into the lungs every 6 (six) hours as needed for wheezing or shortness of breath., Disp: 3 each, Rfl: 3   budesonide -formoterol  (SYMBICORT ) 160-4.5 MCG/ACT inhaler, Inhale 2 puffs into the lungs 2 (two) times daily., Disp: 3 each, Rfl: 3   cetirizine (ZYRTEC) 10 MG tablet, Take 10 mg by mouth daily as needed for allergies., Disp: , Rfl:    desvenlafaxine  (PRISTIQ ) 100 MG 24 hr tablet, Take 1 tablet (100 mg total) by mouth daily., Disp: 90 tablet, Rfl: 3   HYDROcodone -acetaminophen  (NORCO) 10-325 MG tablet, Take 1 tablet by mouth 3 (three) times daily as needed for severe pain (pain score 7-10)., Disp: 90 tablet, Rfl: 0   ibuprofen  (ADVIL ) 200 MG tablet, Take 400 mg by mouth every 6 (six) hours as needed (Back  pain)., Disp: , Rfl:    lisdexamfetamine (VYVANSE ) 50 MG capsule, Take 1 capsule (50 mg total) by mouth daily., Disp: 30 capsule, Rfl: 0   pantoprazole  (PROTONIX ) 40 MG tablet, Take 1 tablet (40 mg total) by mouth 2 (two) times daily., Disp: 60 tablet, Rfl: 5   Soft Lens Products (SENSITIVE EYES SALINE) SOLN, Place 1 drop into both eyes daily. Saline eye lub, Disp: , Rfl:    valACYclovir  (VALTREX ) 500 MG tablet, Take 1 tablet (500 mg total) by mouth daily. Increase to bid prn with outbreak, Disp: 180 tablet, Rfl: 0   VYVANSE  50 MG capsule, Take 1 capsule (50 mg total) by mouth every morning. (Patient not taking: Reported on 04/26/2024), Disp: 30 capsule, Rfl: 0   VYVANSE  50 MG capsule, Take 1 capsule (50 mg total) by mouth daily. Please fill on or after 04/02/24 (Patient not taking: Reported on 04/26/2024), Disp: 30 capsule, Rfl: 0 No current facility-administered medications for this visit.  Facility-Administered Medications Ordered in Other Visits:    gadopentetate dimeglumine  (MAGNEVIST ) injection 18 mL, 18 mL, Intravenous, Once PRN, Penumalli, Vikram R, MD  Allergies  Allergen Reactions   Imitrex  [Sumatriptan ] Anaphylaxis   Sulfonamide Derivatives Shortness Of Breath   Reglan [Metoclopramide] Other (See Comments)    REACTION: Possible cause of SVT.   Zofran  [Ondansetron  Hcl] Palpitations and Other (See Comments)    REACTION: Possible cause of SVT   Miconazole Other (See Comments)   Sulfamethoxazole-Trimethoprim Other (See Comments)  Hibiclens  [Chlorhexidine ] Rash    rash    Objective:   BP 108/62   Pulse 85   Temp 98.4 F (36.9 C)   Resp 20   Ht 5' 4 (1.626 m)   Wt 213 lb (96.6 kg)   LMP 04/11/2012 Comment: ablation  SpO2 95%   BMI 36.56 kg/m    Physical Exam Vitals reviewed.  Constitutional:      General: She is not in acute distress.    Appearance: Normal appearance. She is not ill-appearing, toxic-appearing or diaphoretic.  HENT:     Head: Normocephalic and  atraumatic.  Eyes:     General: No scleral icterus.       Right eye: No discharge.        Left eye: No discharge.     Conjunctiva/sclera: Conjunctivae normal.  Cardiovascular:     Rate and Rhythm: Normal rate.  Pulmonary:     Effort: Pulmonary effort is normal. No respiratory distress.  Musculoskeletal:        General: Normal range of motion.     Cervical back: Normal range of motion.     Left knee: Swelling (of bursa) present. No bony tenderness. Tenderness present.  Skin:    General: Skin is warm and dry.     Capillary Refill: Capillary refill takes less than 2 seconds.  Neurological:     General: No focal deficit present.     Mental Status: She is alert and oriented to person, place, and time. Mental status is at baseline.  Psychiatric:        Mood and Affect: Mood normal.        Behavior: Behavior normal.        Thought Content: Thought content normal.        Judgment: Judgment normal.

## 2024-04-26 NOTE — Telephone Encounter (Signed)
 FYI Only or Action Required?: FYI only for provider.  Patient was last seen in primary care on 03/10/2024 by Plotnikov, Karlynn GAILS, MD.  Called Nurse Triage reporting Joint Swelling.  Symptoms began a week ago.  Interventions attempted: Other: seen at UC - given steroids and ABX.  Symptoms are: gradually worsening.  Triage Disposition: See HCP Within 4 Hours (Or PCP Triage)  Patient/caregiver understands and will follow disposition?: Yes                      Copied from CRM [id number]. Topic: Clinical - Red Word Triage >> Apr 26, 2024 10:15 AM Willma R wrote: [facility] that prompted transfer to Nurse Triage: Patient went to urgent care last week and was put on medication for a swollen knee, possibly a spider bite. Took all 7 days of the medication and woke up today and her knee is swollen to the size of a grapefruit and very painful to walk. Reason for Disposition  [1] Redness AND [2] painful when touched AND [3] no fever  Answer Assessment - Initial Assessment Questions 1. LOCATION: Where is the swelling located?  (e.g., left, right, both knees)     Left knee 2. ONSET: When did the swelling start? Does it come and go, or is it there all the time?     04/19/2024 3. SWELLING: How bad is the swelling? Or, How large is it? (e.g., mild, moderate, severe; size of localized swelling)      swelling 4. PAIN: Is there any pain? If Yes, ask: How bad is it? (Scale 0-10; or none, mild, moderate, severe)     pain 5. SETTING: Has there been any recent work, exercise or other activity that involved that part of the body?      Spider bite  7. ASSOCIATED SYMPTOMS: Is there any pain or redness?     Hot, swelling, red  Protocols used: Knee Swelling-A-AH

## 2024-04-27 NOTE — Progress Notes (Unsigned)
   LILLETTE Ileana Collet, PhD, LAT, ATC acting as a scribe for Artist Lloyd, MD.  CHARNETTA WULFF is a 58 y.o. female who presents to [facility] today for L knee pain ongoing since the 14th. Pain started when she was doing yard work. Pt was seen at [facility] and by her PCP on Monday. Pt locates pain to ***  L Knee swelling: Mechanical symptoms: Radiates: Aggravates: Treatments tried: doxycycline , prednisone , hydrocodone   Dx testing: 02/09/18 L LE vasc US  02/04/18 L knee XR  Pertinent review of systems: ***  Relevant historical information: ***   Exam:  LMP 04/11/2012 Comment: ablation General: Well Developed, well nourished, and in no acute distress.   MSK: ***    Lab and Radiology Results No results found for this or any previous visit (from the past 72 hours). No results found.     Assessment and Plan: 57 y.o. female with ***   PDMP not reviewed this encounter. No orders of the defined types were placed in this encounter.  No orders of the defined types were placed in this encounter.    Discussed warning signs or symptoms. Please see discharge instructions. Patient expresses understanding.   ***

## 2024-04-28 ENCOUNTER — Other Ambulatory Visit: Payer: Self-pay

## 2024-04-28 ENCOUNTER — Ambulatory Visit: Admitting: Family Medicine

## 2024-04-28 ENCOUNTER — Encounter: Payer: Self-pay | Admitting: Family Medicine

## 2024-04-28 VITALS — BP 128/78 | HR 79 | Ht 64.0 in | Wt 214.0 lb

## 2024-04-28 DIAGNOSIS — M25562 Pain in left knee: Secondary | ICD-10-CM | POA: Diagnosis not present

## 2024-04-28 DIAGNOSIS — M71162 Other infective bursitis, left knee: Secondary | ICD-10-CM

## 2024-04-28 DIAGNOSIS — G8929 Other chronic pain: Secondary | ICD-10-CM

## 2024-04-28 MED ORDER — CEFDINIR 300 MG PO CAPS: 300.0000 mg | ORAL_CAPSULE | Freq: Two times a day (BID) | ORAL | 0 refills | Status: AC

## 2024-04-28 MED ORDER — DOXYCYCLINE HYCLATE 100 MG PO TABS: 100.0000 mg | ORAL_TABLET | Freq: Two times a day (BID) | ORAL | 0 refills | Status: AC

## 2024-04-28 NOTE — Patient Instructions (Signed)
 Thank you for coming in today.   Take the backup antibiotics if dramatically worsening.  Please contact [facility] today.  Continue compression.

## 2024-05-03 ENCOUNTER — Other Ambulatory Visit: Payer: Self-pay | Admitting: Internal Medicine

## 2024-05-17 ENCOUNTER — Ambulatory Visit: Admitting: Family Medicine

## 2024-05-17 ENCOUNTER — Ambulatory Visit: Payer: Self-pay

## 2024-05-17 ENCOUNTER — Encounter: Payer: Self-pay | Admitting: Family Medicine

## 2024-05-17 VITALS — BP 140/90 | HR 101 | Temp 98.1°F | Ht 64.0 in | Wt 208.0 lb

## 2024-05-17 DIAGNOSIS — L247 Irritant contact dermatitis due to plants, except food: Secondary | ICD-10-CM

## 2024-05-17 MED ORDER — TRIAMCINOLONE ACETONIDE 0.1 % EX CREA: 1.0000 | TOPICAL_CREAM | Freq: Two times a day (BID) | CUTANEOUS | 0 refills | Status: AC

## 2024-05-17 MED ORDER — METHYLPREDNISOLONE ACETATE 80 MG/ML IJ SUSP
80.0000 mg | Freq: Once | INTRAMUSCULAR | Status: AC
  Administered 2024-05-17 (×2): 80 mg via INTRAMUSCULAR

## 2024-05-17 MED ORDER — PREDNISONE 10 MG (21) PO TBPK: ORAL_TABLET | Freq: Every day | ORAL | 0 refills | Status: AC

## 2024-05-17 NOTE — Telephone Encounter (Signed)
 FYI Only or Action Required?: FYI only for provider.  Patient was last seen in primary care on 04/26/2024 by Merlynn Niki FALCON, FNP.  Called Nurse Triage reporting Poison Sumac.  Symptoms began several days ago.  Interventions attempted: OTC medications: Benadryl , Calamine, cortisone, zyrtec, sudafed .  Symptoms are: gradually worsening.  Triage Disposition: See HCP Within 4 Hours (Or PCP Triage)  Patient/caregiver understands and will follow disposition?:    Copied from CRM #[id number]. Topic: Clinical - Red Word Triage >> May 17, 2024  9:57 AM Rea BROCKS wrote: Red Word that prompted transfer to Nurse Triage: Poison Sumac- itchiness, redness, skin feels like it is on fire. Patient has been using benadryl , calamine lotion, Cortizone, poison sumac lotion, zyrtec, sudafed,and it is spreading from arms, legs, eyes, inner thighs, stomach, side, and hands, big welts.  Last week her and husband were cleaning outside. Reason for Disposition  [1] Severe poison ivy, oak, or sumac reaction in the past AND [2] face or genitals involved  Answer Assessment - Initial Assessment Questions 1. APPEARANCE of RASH: What does the rash look like?      Redness, Welts  2. LOCATION: Where is the rash located?  (e.g., face, genitals, hands, legs)     Head to toe' She feels like it is going to her eye 3. SIZE: How large is the rash?      Large  4. ONSET: When did the rash begin?      Friday 5. ITCHING: Does the rash itch? If Yes, ask: How bad is it?     intense 6. EXPOSURE:  How were you exposed to the plant (poison ivy, poison oak, sumac)  When were you exposed?  Note: Sometimes a poison ivy/oak/sumac rash does not appear for 2 to 3 weeks after exposure.      Last week poison sumac  8. OTHER SYMPTOMS: Do you have any other symptoms? (e.g., fever)      No  Additional info: Has tried multiple over the counter treatments nothing is working.  Protocols used: Poison Ivy - Oak -  Sumac-A-AH

## 2024-05-17 NOTE — Patient Instructions (Signed)
 You have received a steroid injection in the office today.  Take by mouth daily for 6 days. Take 6 tablets on day 1, 5 tablets on day 2, 4 tablets on day 3, 3 tablets on day 4, 2 tablets on day 5, 1 tablet on day 6  I have sent in triamcinolone  cream for you.  You may use this twice a day.  I would have you follow up with dermatology about the lesion to your right lower leg  Follow-up with me for new or worsening symptoms.

## 2024-05-17 NOTE — Progress Notes (Signed)
 Acute Office Visit  Subjective:     Patient ID: Tamara Cross, female    DOB: 10/17/1965, 58 y.o.   MRN: [id number]  Chief Complaint  Patient presents with   Acute Visit    Poison ivy exposure about 1 week ago, bilat arms and legs, neck, back. Has tried calamine lotions, benadryl , and cortisone creams    HPI  Discussed the use of AI scribe software for clinical note transcription with the patient, who gave verbal consent to proceed.  History of Present Illness Tamara Cross is a 58 year old female who presents with a severe allergic skin reaction.  Allergic dermatitis - Severe pruritic and burning rash present on arms and stomach, spreading to back - Symptoms ongoing for one week - No improvement with over-the-counter treatments including calamine lotion, Benadryl , and cortisone cream - History of severe allergic reactions requiring steroid injections - No respiratory symptoms or oral pruritus - Symptoms confined to the skin  Painful skin lesion - Skin lesion present for approximately one month to anterior R lower leg - Worsening over the past week with increased pain and expanding red circle - Dermatology appointment scheduled for end of August     ROS Per HPI      Objective:    BP (!) 140/90 (BP Location: Left Arm, Patient Position: Sitting)   Pulse (!) 101   Temp 98.1 F (36.7 C) (Temporal)   Ht 5' 4 (1.626 m)   Wt 208 lb (94.3 kg)   LMP 04/11/2012 Comment: ablation  SpO2 96%   BMI 35.70 kg/m    Physical Exam Vitals and nursing note reviewed.  Constitutional:      General: She is not in acute distress.    Appearance: Normal appearance. She is normal weight.  HENT:     Head: Normocephalic and atraumatic.     Right Ear: External ear normal.     Left Ear: External ear normal.     Nose: Nose normal.     Mouth/Throat:     Mouth: Mucous membranes are moist.     Pharynx: Oropharynx is clear.  Eyes:     Extraocular Movements: Extraocular movements  intact.     Pupils: Pupils are equal, round, and reactive to light.  Cardiovascular:     Rate and Rhythm: Normal rate and regular rhythm.     Pulses: Normal pulses.     Heart sounds: Normal heart sounds.  Pulmonary:     Effort: Pulmonary effort is normal. No respiratory distress.     Breath sounds: Normal breath sounds. No wheezing, rhonchi or rales.  Musculoskeletal:        General: Normal range of motion.     Cervical back: Normal range of motion.     Right lower leg: No edema.     Left lower leg: No edema.  Lymphadenopathy:     Cervical: No cervical adenopathy.  Skin:    Findings: Lesion and rash present.     Comments: Multiple areas of maculopapular, erythematous rash to arms, abdomen, legs, c/w contact derm Separate lesion in photo  Neurological:     General: No focal deficit present.     Mental Status: She is alert and oriented to person, place, and time.  Psychiatric:        Mood and Affect: Mood normal.        Thought Content: Thought content normal.     No results found for any visits on 05/17/24.  Assessment & Plan:   Assessment and Plan Assessment & Plan Irritant contact dermatitis due to plants Irritant contact dermatitis from likely poison oak or ivy exposure, with severe itching and burning unresponsive to OTC treatments. No respiratory or oral involvement. - Depo Medrol  80mg  IM in office today - Prescribe oral steroids, send prescription to [facility] on [address]. - Prescribe topical steroids.  Skin lesion of uncertain etiology, referred to dermatology Skin lesion with expanding redness, etiology uncertain, requires dermatological evaluation. - Advise to keep dermatology appointment for further evaluation.     No orders of the defined types were placed in this encounter.    Meds ordered this encounter  Medications   triamcinolone  cream (KENALOG ) 0.1 %    Sig: Apply 1 Application topically 2 (two) times daily.    Dispense:  30 g    Refill:   0   predniSONE  (STERAPRED UNI-PAK 21 TAB) 10 MG (21) TBPK tablet    Sig: Take by mouth daily for 6 days. Take 6 tablets on day 1, 5 tablets on day 2, 4 tablets on day 3, 3 tablets on day 4, 2 tablets on day 5, 1 tablet on day 6    Dispense:  21 tablet    Refill:  0   methylPREDNISolone  acetate (DEPO-MEDROL ) injection 80 mg    Return if symptoms worsen or fail to improve.  Corean LITTIE Ku, FNP

## 2024-05-18 ENCOUNTER — Encounter: Payer: Self-pay | Admitting: Internal Medicine

## 2024-05-27 ENCOUNTER — Ambulatory Visit: Admitting: Internal Medicine

## 2024-05-27 ENCOUNTER — Encounter: Payer: Self-pay | Admitting: Internal Medicine

## 2024-05-27 VITALS — BP 114/64 | HR 82 | Temp 99.1°F | Ht 64.0 in | Wt 212.0 lb

## 2024-05-27 DIAGNOSIS — F909 Attention-deficit hyperactivity disorder, unspecified type: Secondary | ICD-10-CM

## 2024-05-27 DIAGNOSIS — R7989 Other specified abnormal findings of blood chemistry: Secondary | ICD-10-CM | POA: Diagnosis not present

## 2024-05-27 DIAGNOSIS — F3341 Major depressive disorder, recurrent, in partial remission: Secondary | ICD-10-CM | POA: Diagnosis not present

## 2024-05-27 DIAGNOSIS — M5440 Lumbago with sciatica, unspecified side: Secondary | ICD-10-CM

## 2024-05-27 DIAGNOSIS — G8929 Other chronic pain: Secondary | ICD-10-CM | POA: Diagnosis not present

## 2024-05-27 DIAGNOSIS — R739 Hyperglycemia, unspecified: Secondary | ICD-10-CM

## 2024-05-27 DIAGNOSIS — M79606 Pain in leg, unspecified: Secondary | ICD-10-CM

## 2024-05-27 DIAGNOSIS — Z0000 Encounter for general adult medical examination without abnormal findings: Secondary | ICD-10-CM

## 2024-05-27 DIAGNOSIS — Z0001 Encounter for general adult medical examination with abnormal findings: Secondary | ICD-10-CM

## 2024-05-27 DIAGNOSIS — F419 Anxiety disorder, unspecified: Secondary | ICD-10-CM

## 2024-05-27 LAB — URINALYSIS, ROUTINE W REFLEX MICROSCOPIC
Bilirubin Urine: NEGATIVE
Hgb urine dipstick: NEGATIVE
Ketones, ur: NEGATIVE
Nitrite: NEGATIVE
Specific Gravity, Urine: 1.015 (ref 1.000–1.030)
Total Protein, Urine: NEGATIVE
Urine Glucose: NEGATIVE
Urobilinogen, UA: 1 (ref 0.0–1.0)
pH: 7 (ref 5.0–8.0)

## 2024-05-27 LAB — CBC WITH DIFFERENTIAL/PLATELET
Basophils Absolute: 0.1 K/uL (ref 0.0–0.1)
Basophils Relative: 0.5 % (ref 0.0–3.0)
Eosinophils Absolute: 0.2 K/uL (ref 0.0–0.7)
Eosinophils Relative: 1.6 % (ref 0.0–5.0)
HCT: 44.9 % (ref 36.0–46.0)
Hemoglobin: 15 g/dL (ref 12.0–15.0)
Lymphocytes Relative: 21.6 % (ref 12.0–46.0)
Lymphs Abs: 2.1 K/uL (ref 0.7–4.0)
MCHC: 33.4 g/dL (ref 30.0–36.0)
MCV: 90.2 fl (ref 78.0–100.0)
Monocytes Absolute: 0.6 K/uL (ref 0.1–1.0)
Monocytes Relative: 6.4 % (ref 3.0–12.0)
Neutro Abs: 6.9 K/uL (ref 1.4–7.7)
Neutrophils Relative %: 69.9 % (ref 43.0–77.0)
Platelets: 244 K/uL (ref 150.0–400.0)
RBC: 4.98 Mil/uL (ref 3.87–5.11)
RDW: 14.1 % (ref 11.5–15.5)
WBC: 9.9 K/uL (ref 4.0–10.5)

## 2024-05-27 LAB — COMPREHENSIVE METABOLIC PANEL WITH GFR
ALT: 22 U/L (ref 0–35)
AST: 19 U/L (ref 0–37)
Albumin: 4 g/dL (ref 3.5–5.2)
Alkaline Phosphatase: 89 U/L (ref 39–117)
BUN: 10 mg/dL (ref 6–23)
CO2: 30 meq/L (ref 19–32)
Calcium: 8.6 mg/dL (ref 8.4–10.5)
Chloride: 102 meq/L (ref 96–112)
Creatinine, Ser: 0.59 mg/dL (ref 0.40–1.20)
GFR: 98.08 mL/min (ref >60.00)
Glucose, Bld: 97 mg/dL (ref 70–99)
Potassium: 4.7 meq/L (ref 3.5–5.1)
Sodium: 141 meq/L (ref 135–145)
Total Bilirubin: 0.7 mg/dL (ref 0.2–1.2)
Total Protein: 6.6 g/dL (ref 6.0–8.3)

## 2024-05-27 LAB — LIPID PANEL
Cholesterol: 228 mg/dL — ABNORMAL HIGH (ref 0–200)
HDL: 66.1 mg/dL (ref >39.00)
LDL Cholesterol: 140 mg/dL — ABNORMAL HIGH (ref 0–99)
NonHDL: 161.75
Total CHOL/HDL Ratio: 3
Triglycerides: 109 mg/dL (ref 0.0–149.0)
VLDL: 21.8 mg/dL (ref 0.0–40.0)

## 2024-05-27 LAB — TSH: TSH: 5.82 u[IU]/mL — ABNORMAL HIGH (ref 0.35–5.50)

## 2024-05-27 LAB — T4, FREE: Free T4: 0.75 ng/dL (ref 0.60–1.60)

## 2024-05-27 LAB — T3, FREE: T3, Free: 3.5 pg/mL (ref 2.3–4.2)

## 2024-05-27 MED ORDER — VALACYCLOVIR HCL 500 MG PO TABS: 500.0000 mg | ORAL_TABLET | Freq: Every day | ORAL | 1 refills | Status: AC

## 2024-05-27 MED ORDER — DIAZEPAM 5 MG PO TABS: ORAL_TABLET | ORAL | 1 refills | Status: DC

## 2024-05-27 MED ORDER — HYDROCODONE-ACETAMINOPHEN 10-325 MG PO TABS: 1.0000 | ORAL_TABLET | Freq: Four times a day (QID) | ORAL | 0 refills | Status: DC | PRN

## 2024-05-27 NOTE — Assessment & Plan Note (Signed)
Check FT4, FT3 

## 2024-05-27 NOTE — Progress Notes (Signed)
 Subjective:  Patient ID: Tamara Cross, female    DOB: 12-11-1965  Age: 58 y.o. MRN: [id number]  CC: Sciatica (Patient here for sciatica pain in her right leg. She is having shooting pain that goes to buttocks as well. She having tingling, shooting pain, numbness. She has been taking her hydrocodone  that was for her left leg for the right and has run out.)   HPI Tamara Cross presents for new sciatica pain in her right leg (it was on the left before). She is having shooting pain that goes to buttocks as well. She having tingling, shooting pain, numbness.   She has been taking her hydrocodone  that was for her left leg for the right and has run out. 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     albuterol  (VENTOLIN  HFA) 108 (90 Base) MCG/ACT inhaler Inhale 1-2 puffs into the lungs every 6 (six) hours as needed for wheezing or shortness of breath. 3 each 3   budesonide -formoterol  (SYMBICORT ) 160-4.5 MCG/ACT inhaler Inhale 2 puffs into the lungs 2 (two) times daily. 3 each 3   cetirizine (ZYRTEC) 10 MG tablet Take 10 mg by mouth daily as needed for allergies.     desvenlafaxine  (PRISTIQ ) 100 MG 24 hr tablet Take 1 tablet (100 mg total) by mouth daily. 90 tablet 3   ibuprofen  (ADVIL ) 200 MG tablet Take 400 mg by mouth every 6 (six) hours as needed (Back pain).     lisdexamfetamine (VYVANSE ) 50 MG capsule Take 1 capsule (50 mg total) by mouth daily. 30 capsule 0   pantoprazole  (PROTONIX ) 40 MG tablet Take 1 tablet (40 mg total) by mouth 2 (two) times daily. 60 tablet 5   HYDROcodone -acetaminophen  (NORCO) 10-325 MG tablet Take 1 tablet by mouth 3 (three) times daily as needed for severe pain (pain score 7-10). 90 tablet 0    valACYclovir  (VALTREX ) 500 MG tablet Take 1 tablet (500 mg total) by mouth daily. Increase to bid prn with outbreak 180 tablet 0   Soft Lens Products (SENSITIVE EYES SALINE) SOLN Place 1 drop into both eyes daily. Saline eye lub (Patient not taking: Reported on 05/27/2024)     triamcinolone  cream (KENALOG ) 0.1 % Apply 1 Application topically 2 (two) times daily. (Patient not taking: Reported on 05/27/2024) 30 g 0   VYVANSE  50 MG capsule Take 1 capsule (50 mg total) by mouth every morning. (Patient not taking: Reported on 05/27/2024) 30 capsule 0   VYVANSE  50 MG capsule TAKE 1 CAPSULE (50 MG TOTAL) BY MOUTH DAILY. PLEASE FILL ON OR AFTER 04/02/24 (Patient not taking: Reported on 05/27/2024) 30 capsule 0   Facility-Administered Medications Prior to Visit  Medication Dose Route Frequency Provider Last Rate Last Admin   gadopentetate dimeglumine  (MAGNEVIST ) injection 18 mL  18 mL Intravenous Once PRN Penumalli, Vikram R, MD        ROS: Review of Systems  Constitutional:  Negative for activity change, appetite change, chills, fatigue and unexpected weight change.  HENT:  Negative for congestion, mouth sores and sinus pressure.   Eyes:  Negative for visual disturbance.  Respiratory:  Negative for cough and chest tightness.   Gastrointestinal:  Negative for abdominal  pain and nausea.  Genitourinary:  Negative for difficulty urinating, frequency and vaginal pain.  Musculoskeletal:  Positive for back pain and gait problem.  Skin:  Positive for rash. Negative for pallor.  Neurological:  Negative for dizziness, tremors, weakness, numbness and headaches.  Psychiatric/Behavioral:  Negative for confusion, sleep disturbance and suicidal ideas. The patient is nervous/anxious.     Objective:  BP 114/64 (BP Location: Left Arm, Patient Position: Sitting, Cuff Size: Normal)   Pulse 82   Temp 99.1 F (37.3 C) (Oral)   Ht 5' 4 (1.626 m)   Wt 212 lb (96.2 kg)   LMP 04/11/2012 Comment: ablation  SpO2 96%   BMI  36.39 kg/m   BP Readings from Last 3 Encounters:  05/27/24 114/64  05/17/24 (!) 140/90  04/28/24 128/78    Wt Readings from Last 3 Encounters:  05/27/24 212 lb (96.2 kg)  05/17/24 208 lb (94.3 kg)  04/28/24 214 lb (97.1 kg)    Physical Exam Constitutional:      General: She is not in acute distress.    Appearance: She is well-developed.  HENT:     Head: Normocephalic.     Right Ear: External ear normal.     Left Ear: External ear normal.     Nose: Nose normal.  Eyes:     General:        Right eye: No discharge.        Left eye: No discharge.     Conjunctiva/sclera: Conjunctivae normal.     Pupils: Pupils are equal, round, and reactive to light.  Neck:     Thyroid : No thyromegaly.     Vascular: No JVD.     Trachea: No tracheal deviation.  Cardiovascular:     Rate and Rhythm: Normal rate and regular rhythm.     Heart sounds: Normal heart sounds.  Pulmonary:     Effort: No respiratory distress.     Breath sounds: No stridor. No wheezing.  Abdominal:     General: Bowel sounds are normal. There is no distension.     Palpations: Abdomen is soft. There is no mass.     Tenderness: There is no abdominal tenderness. There is no guarding or rebound.  Musculoskeletal:        General: Tenderness present.     Cervical back: Normal range of motion and neck supple. No rigidity.     Right lower leg: No edema.     Left lower leg: No edema.  Lymphadenopathy:     Cervical: No cervical adenopathy.  Skin:    Findings: No erythema or rash.  Neurological:     Cranial Nerves: No cranial nerve deficit.     Motor: No abnormal muscle tone.     Coordination: Coordination normal.     Deep Tendon Reflexes: Reflexes normal.  Psychiatric:        Behavior: Behavior normal.        Thought Content: Thought content normal.        Judgment: Judgment normal.   LS w/pain Using a cane Str leg (+) on the R    A total time of 45 minutes was spent preparing to see the patient, reviewing  tests, x-rays, operative reports and other medical records.  Also, obtaining history and performing comprehensive physical exam.  Additionally, counseling the patient regarding the above listed issues - sciatica, anxiety attacks, ADD.   Finally, documenting clinical information in the health records, coordination of care, educating the patient. It is a complex case.  Lab Results  Component Value Date   WBC 10.1 08/07/2022   HGB 14.4 08/07/2022   HCT 42.9 08/07/2022   PLT 244.0 08/07/2022   GLUCOSE 86 08/07/2022   CHOL 224 (H) 08/07/2022   TRIG 79.0 08/07/2022   HDL 65.60 08/07/2022   LDLCALC 142 (H) 08/07/2022   ALT 19 08/07/2022   AST 17 08/07/2022   NA 140 08/07/2022   K 3.7 08/07/2022   CL 101 08/07/2022   CREATININE 0.65 08/07/2022   BUN 12 08/07/2022   CO2 32 08/07/2022   TSH 4.86 11/29/2022   HGBA1C 6.2 08/07/2022    No results found.  Assessment & Plan:   Problem List Items Addressed This Visit     Abnormal TSH   Check FT4, FT3      ADHD (attention deficit hyperactivity disorder) - Primary   On Rx -Vyvance  Potential benefits of a long term stimulant use as well as potential risks  and complications were explained to the patient and were aknowledged.      Leg pain   R sciatica - new L sciatica  Contusion: X ray - L  hip, LS spine  Hx:  Per Dr Tanda (W-F): Ms. Lafreda Casebeer is a 58 y.o. 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 Today we discussed the efficacy of previous ESI's and patient states that she did not get much relief from most recent caudal. She would like to attempt an interlaminar ESI while considering SCS. We spoke at length regarding the SCS process and she was provided with multiple information sources to continue to research the topic. She seems very interested and will likely proceed with SCS trial in near future.  1. Chronic lumbar radiculopathy (Left L5)  Case request operating room: LUMBAR SACRAL INTERLAMINAR EPIDURAL STEROID INJECTION, L5-S1  2. Failed back surgical syndrome       Low back pain   Worse Increase Norco 10/325 po to qid prn w/caution  Potential benefits of a long term opioids use as well as potential risks (i.e. addiction risk, apnea etc) and complications (i.e. Somnolence, constipation and others) were explained to the patient and were aknowledged.       Relevant Medications   HYDROcodone -acetaminophen  (NORCO) 10-325 MG tablet      Meds ordered this encounter  Medications   valACYclovir  (VALTREX ) 500 MG tablet    Sig: Take 1 tablet (500 mg total) by mouth daily. Increase to bid prn with outbreak    Dispense:  180 tablet    Refill:  1   HYDROcodone -acetaminophen  (NORCO) 10-325 MG tablet    Sig: Take 1 tablet by mouth every 6 (six) hours as needed for severe pain (pain score 7-10).    Dispense:  120 tablet    Refill:  0    Please fill on or after 05/27/24   diazepam  (VALIUM ) 5 MG tablet    Sig: Take 1-2 tablets 1 hour before dental work. Have a driver    Dispense:  6 tablet    Refill:  1      Follow-up: Return in about 2 months (around 07/27/2024) for a follow-up visit.  Marolyn Noel, MD

## 2024-05-27 NOTE — Assessment & Plan Note (Signed)
On Rx -Vyvance  Potential benefits of a long term stimulant use as well as potential risks  and complications were explained to the patient and were aknowledged.

## 2024-05-27 NOTE — Assessment & Plan Note (Signed)
 Worse Increase Norco 10/325 po to qid prn w/caution  Potential benefits of a long term opioids use as well as potential risks (i.e. addiction risk, apnea etc) and complications (i.e. Somnolence, constipation and others) were explained to the patient and were aknowledged.

## 2024-05-27 NOTE — Assessment & Plan Note (Signed)
 R sciatica - new L sciatica  Contusion: X ray - L  hip, LS spine  Hx:  Per Dr Tanda (W-F): Ms. Tamara Cross is a 58 y.o. 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 Today we discussed the efficacy of previous ESI's and patient states that she did not get much relief from most recent caudal. She would like to attempt an interlaminar ESI while considering SCS. We spoke at length regarding the SCS process and she was provided with multiple information sources to continue to research the topic. She seems very interested and will likely proceed with SCS trial in near future.  1. Chronic lumbar radiculopathy (Left L5) Case request operating room: LUMBAR SACRAL INTERLAMINAR EPIDURAL STEROID INJECTION, L5-S1  2. Failed back surgical syndrome

## 2024-05-28 LAB — PMP SCREEN PROFILE (10S), URINE
Amphetamine Scrn, Ur: POSITIVE ng/mL — AB
BARBITURATE SCREEN URINE: NEGATIVE ng/mL
BENZODIAZEPINE SCREEN, URINE: NEGATIVE ng/mL
CANNABINOIDS UR QL SCN: NEGATIVE ng/mL
Cocaine (Metab) Scrn, Ur: NEGATIVE ng/mL
Creatinine(Crt), U: 106.7 mg/dL (ref 20.0–300.0)
Methadone Screen, Urine: NEGATIVE ng/mL
OXYCODONE+OXYMORPHONE UR QL SCN: NEGATIVE ng/mL
Opiate Scrn, Ur: NEGATIVE ng/mL
Ph of Urine: 8.8 (ref 4.5–8.9)
Phencyclidine Qn, Ur: NEGATIVE ng/mL
Propoxyphene Scrn, Ur: NEGATIVE ng/mL

## 2024-05-30 ENCOUNTER — Other Ambulatory Visit: Payer: Self-pay | Admitting: Internal Medicine

## 2024-05-30 ENCOUNTER — Ambulatory Visit: Payer: Self-pay | Admitting: Internal Medicine

## 2024-05-30 MED ORDER — NITROFURANTOIN MONOHYD MACRO 100 MG PO CAPS: 100.0000 mg | ORAL_CAPSULE | Freq: Two times a day (BID) | ORAL | 0 refills | Status: AC

## 2024-06-23 ENCOUNTER — Other Ambulatory Visit: Payer: Self-pay | Admitting: Internal Medicine

## 2024-06-24 NOTE — Telephone Encounter (Signed)
 Medication: Vyvanse  & Hydrocodone  Directions: Last given:  05/04/2024 & 05/27/2024  Number refills: 0 & 0  Last o/v: 05/27/24 Follow up:  Labs: 05/27/24  Please review refill request.

## 2024-06-28 NOTE — Telephone Encounter (Unsigned)
 Copied from CRM [id number]. Topic: Clinical - Prescription Issue >> Jun 28, 2024  1:16 PM Lauren C wrote: Reason for CRM: [facility] is out of Vyvanse  50mg . Pt is requesting refill be transferred to her secondary pharmacy: [id number] - [place], [place] - [address] AT [address] [name] [address] Phone: [phone] Fax: [phone]  She will be going out of town this afternoon and is completely out of her Vyvanse  so is requesting it be transferred as soon as possible.

## 2024-06-29 ENCOUNTER — Other Ambulatory Visit: Payer: Self-pay | Admitting: Internal Medicine

## 2024-06-29 ENCOUNTER — Ambulatory Visit: Payer: Self-pay

## 2024-06-29 MED ORDER — VYVANSE 50 MG PO CAPS: 50.0000 mg | ORAL_CAPSULE | Freq: Every day | ORAL | 0 refills | Status: DC

## 2024-06-29 NOTE — Telephone Encounter (Addendum)
 I have contacted the on call provider who was able to send the refill for the patient.      Copied from CRM #[id number]. Topic: Clinical - Red Word Triage >> Jun 29, 2024  5:02 PM Jasmin G wrote: [facility] that prompted transfer to Nurse Triage: Pt requested to speak to NT regarding her lisdexamfetamine (VYVANSE ) 50 MG capsule, pt states that she was told that med would be refilled today by the end of the day because she will be going out of town soon and needs it, med hasn't been refilled, I relayed info left in recent Encounters, please refer for more info.       Reason for Disposition  [1] Prescription refill request for ESSENTIAL medicine (i.e., likelihood of harm to patient if not taken) AND [2] triager unable to refill per department policy  Answer Assessment - Initial Assessment Questions [facility] is out of Vyvanse  50mg . Pt is requesting refill be transferred to her secondary pharmacy:  [id number] - [place], [place] - [address] AT [address] [name] [address] Phone: [phone] Fax: [phone]   She will be going out of town this afternoon and is completely out of her Vyvanse  so is requesting it be transferred as soon as possible.     1. DRUG NAME: What medicine do you need to have refilled?     Vyvanse  4. PRESCRIBER: Who prescribed it? Note: The prescribing doctor or group is responsible for refill approvals..     Dr. Garald  Protocols used: Medication Refill and Renewal Call-A-AH

## 2024-07-07 ENCOUNTER — Encounter: Payer: Self-pay | Admitting: Internal Medicine

## 2024-07-07 ENCOUNTER — Ambulatory Visit: Admitting: Internal Medicine

## 2024-07-07 VITALS — BP 124/72 | HR 92 | Temp 98.1°F | Ht 64.0 in | Wt 212.2 lb

## 2024-07-07 DIAGNOSIS — Z6836 Body mass index (BMI) 36.0-36.9, adult: Secondary | ICD-10-CM

## 2024-07-07 DIAGNOSIS — F909 Attention-deficit hyperactivity disorder, unspecified type: Secondary | ICD-10-CM

## 2024-07-07 DIAGNOSIS — D485 Neoplasm of uncertain behavior of skin: Secondary | ICD-10-CM

## 2024-07-07 DIAGNOSIS — M5416 Radiculopathy, lumbar region: Secondary | ICD-10-CM | POA: Diagnosis not present

## 2024-07-07 DIAGNOSIS — M5126 Other intervertebral disc displacement, lumbar region: Secondary | ICD-10-CM | POA: Diagnosis not present

## 2024-07-07 DIAGNOSIS — G8929 Other chronic pain: Secondary | ICD-10-CM

## 2024-07-07 DIAGNOSIS — M5440 Lumbago with sciatica, unspecified side: Secondary | ICD-10-CM

## 2024-07-07 DIAGNOSIS — E669 Obesity, unspecified: Secondary | ICD-10-CM

## 2024-07-07 MED ORDER — HYDROCODONE-ACETAMINOPHEN 10-325 MG PO TABS: ORAL_TABLET | ORAL | 0 refills | Status: DC

## 2024-07-07 MED ORDER — TIRZEPATIDE-WEIGHT MANAGEMENT 2.5 MG/0.5ML ~~LOC~~ SOLN: 2.5000 mg | SUBCUTANEOUS | Status: DC

## 2024-07-07 MED ORDER — DIAZEPAM 5 MG PO TABS: ORAL_TABLET | ORAL | 1 refills | Status: AC

## 2024-07-07 MED ORDER — LISDEXAMFETAMINE DIMESYLATE 50 MG PO CAPS: 50.0000 mg | ORAL_CAPSULE | Freq: Every day | ORAL | 0 refills | Status: DC

## 2024-07-07 MED ORDER — TIRZEPATIDE-WEIGHT MANAGEMENT 2.5 MG/0.5ML ~~LOC~~ SOLN: 2.5000 mg | SUBCUTANEOUS | 5 refills | Status: DC

## 2024-07-07 NOTE — Assessment & Plan Note (Signed)
On Rx -Vyvance  Potential benefits of a long term stimulant use as well as potential risks  and complications were explained to the patient and were aknowledged.

## 2024-07-07 NOTE — Progress Notes (Signed)
 Subjective:  Patient ID: Tamara Cross, female    DOB: 1965/12/23  Age: 58 y.o. MRN: [id number]  CC: Back Pain (Herniated disc / sciatica follow up)   HPI Tamara Cross presents for LBP irrad to the R lateral lower leg and the foot. Pain is severe at 10+/10. On Norco 1.5 tab 10/325 pain is 5/10. Worse w/walking...  Outpatient Medications Prior to Visit  Medication Sig Dispense Refill   acetaminophen  (TYLENOL ) 500 MG tablet Take 1,000 mg by mouth every 6 (six) hours as needed for headache.     albuterol  (VENTOLIN  HFA) 108 (90 Base) MCG/ACT inhaler Inhale 1-2 puffs into the lungs every 6 (six) hours as needed for wheezing or shortness of breath. 3 each 3   budesonide -formoterol  (SYMBICORT ) 160-4.5 MCG/ACT inhaler Inhale 2 puffs into the lungs 2 (two) times daily. 3 each 3   cetirizine (ZYRTEC) 10 MG tablet Take 10 mg by mouth daily as needed for allergies.     desvenlafaxine  (PRISTIQ ) 100 MG 24 hr tablet Take 1 tablet (100 mg total) by mouth daily. 90 tablet 3   ibuprofen  (ADVIL ) 200 MG tablet Take 400 mg by mouth every 6 (six) hours as needed (Back pain).     nitrofurantoin , macrocrystal-monohydrate, (MACROBID ) 100 MG capsule Take 1 capsule (100 mg total) by mouth 2 (two) times daily. 14 capsule 0   pantoprazole  (PROTONIX ) 40 MG tablet Take 1 tablet (40 mg total) by mouth 2 (two) times daily. 60 tablet 5   valACYclovir  (VALTREX ) 500 MG tablet Take 1 tablet (500 mg total) by mouth daily. Increase to bid prn with outbreak 180 tablet 1   VYVANSE  50 MG capsule Take 1 capsule (50 mg total) by mouth daily. 30 capsule 0   diazepam  (VALIUM ) 5 MG tablet Take 1-2 tablets 1 hour before dental work. Have a driver 6 tablet 1   HYDROcodone -acetaminophen  (NORCO) 10-325 MG tablet Take 1 tablet by mouth every 6 (six) hours as needed for severe pain (pain score 7-10). 90 tablet 0   lisdexamfetamine (VYVANSE ) 50 MG capsule Take 1 capsule (50 mg total) by mouth daily. 30 capsule 0   Soft Lens Products  (SENSITIVE EYES SALINE) SOLN Place 1 drop into both eyes daily. Saline eye lub (Patient not taking: Reported on 07/07/2024)     triamcinolone  cream (KENALOG ) 0.1 % Apply 1 Application topically 2 (two) times daily. (Patient not taking: Reported on 07/07/2024) 30 g 0   VYVANSE  50 MG capsule Take 1 capsule (50 mg total) by mouth every morning. (Patient not taking: Reported on 07/07/2024) 30 capsule 0   Facility-Administered Medications Prior to Visit  Medication Dose Route Frequency Provider Last Rate Last Admin   gadopentetate dimeglumine  (MAGNEVIST ) injection 18 mL  18 mL Intravenous Once PRN Penumalli, Vikram R, MD        ROS: Review of Systems  Constitutional:  Positive for fatigue. Negative for activity change, appetite change, chills and unexpected weight change.  HENT:  Negative for congestion, mouth sores and sinus pressure.   Eyes:  Negative for visual disturbance.  Respiratory:  Negative for cough and chest tightness.   Gastrointestinal:  Negative for abdominal pain and nausea.  Genitourinary:  Negative for difficulty urinating, frequency and vaginal pain.  Musculoskeletal:  Positive for back pain and gait problem.  Skin:  Negative for pallor and rash.  Neurological:  Negative for dizziness, tremors, weakness, numbness and headaches.  Psychiatric/Behavioral:  Negative for confusion and sleep disturbance.     Objective:  BP 124/72  Pulse 92   Temp 98.1 F (36.7 C)   Ht 5' 4 (1.626 m)   Wt 212 lb 3.2 oz (96.3 kg)   LMP 04/11/2012 Comment: ablation  SpO2 99%   BMI 36.42 kg/m   BP Readings from Last 3 Encounters:  07/07/24 124/72  05/27/24 114/64  05/17/24 (!) 140/90    Wt Readings from Last 3 Encounters:  07/07/24 212 lb 3.2 oz (96.3 kg)  05/27/24 212 lb (96.2 kg)  05/17/24 208 lb (94.3 kg)    Physical Exam Constitutional:      General: She is not in acute distress.    Appearance: She is well-developed. She is obese.  HENT:     Head: Normocephalic.     Right  Ear: External ear normal.     Left Ear: External ear normal.     Nose: Nose normal.  Eyes:     General:        Right eye: No discharge.        Left eye: No discharge.     Conjunctiva/sclera: Conjunctivae normal.     Pupils: Pupils are equal, round, and reactive to light.  Neck:     Thyroid : No thyromegaly.     Vascular: No JVD.     Trachea: No tracheal deviation.  Cardiovascular:     Rate and Rhythm: Normal rate and regular rhythm.     Heart sounds: Normal heart sounds.  Pulmonary:     Effort: No respiratory distress.     Breath sounds: No stridor. No wheezing.  Abdominal:     General: Bowel sounds are normal. There is no distension.     Palpations: Abdomen is soft. There is no mass.     Tenderness: There is no abdominal tenderness. There is no guarding or rebound.  Musculoskeletal:        General: Tenderness present.     Cervical back: Normal range of motion and neck supple. No rigidity.  Lymphadenopathy:     Cervical: No cervical adenopathy.  Skin:    Findings: No erythema or rash.  Neurological:     Mental Status: She is oriented to person, place, and time.     Cranial Nerves: No cranial nerve deficit.     Motor: No abnormal muscle tone.     Coordination: Coordination normal.     Deep Tendon Reflexes: Reflexes normal.  Psychiatric:        Behavior: Behavior normal.        Thought Content: Thought content normal.        Judgment: Judgment normal.   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  Also, obtaining history and performing comprehensive physical exam.  Additionally, counseling the patient regarding the above listed issues.   Finally, documenting clinical information in the health records, coordination of care, educating the patient re pain meds, skin cancer. ADD.    Lab Results  Component  Value Date   WBC 9.9 05/27/2024   HGB 15.0 05/27/2024   HCT 44.9 05/27/2024   PLT 244.0 05/27/2024   GLUCOSE 97 05/27/2024   CHOL 228 (H) 05/27/2024   TRIG 109.0 05/27/2024   HDL 66.10 05/27/2024   LDLCALC 140 (H) 05/27/2024   ALT 22 05/27/2024   AST 19 05/27/2024   NA 141 05/27/2024   K 4.7 05/27/2024  CL 102 05/27/2024   CREATININE 0.59 05/27/2024   BUN 10 05/27/2024   CO2 30 05/27/2024   TSH 5.82 (H) 05/27/2024   HGBA1C 6.2 08/07/2022    No results found.  Assessment & Plan:   Problem List Items Addressed This Visit     ADHD (attention deficit hyperactivity disorder)   On Rx -Vyvance  Potential benefits of a long term stimulant use as well as potential risks  and complications were explained to the patient and were aknowledged.      Chronic lumbar radiculopathy - Primary   Worse LBP irrad to the R lateral lower leg and the foot. Pain is severe at 10+/10. On Norco 1.5 tab 10/325 pain is 5/10. Worse w/walking...      Relevant Medications   tirzepatide (ZEPBOUND) 2.5 MG/0.5ML injection vial   tirzepatide (ZEPBOUND) 2.5 MG/0.5ML injection vial   lisdexamfetamine (VYVANSE ) 50 MG capsule   diazepam  (VALIUM ) 5 MG tablet   Other Relevant Orders   Ambulatory referral to Neurosurgery   HNP (herniated nucleus pulposus), lumbar   Worse LBP irrad to the R lateral lower leg and the foot. Pain is severe at 10+/10. On Norco 1.5 tab 10/325 pain is 5/10. Worse w/walking...      Relevant Orders   Ambulatory referral to Neurosurgery   Low back pain   Worse LBP irrad to the R lateral lower leg and the foot. Pain is severe at 10+/10. On Norco 1.5 tab 10/325 pain is 5/10. Worse w/walking...  Potential benefits of a long term opioids use as well as potential risks (i.e. addiction risk, apnea etc) and complications (i.e. Somnolence, constipation and others) were explained to the patient and were aknowledged. 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 Zepbound Rx      Relevant Medications   tirzepatide (ZEPBOUND) 2.5 MG/0.5ML injection vial   tirzepatide (ZEPBOUND) 2.5 MG/0.5ML injection vial   lisdexamfetamine (VYVANSE ) 50 MG capsule      Meds ordered this encounter  Medications   tirzepatide (ZEPBOUND) 2.5 MG/0.5ML injection vial    Sig: Inject 2.5 mg into the skin once a week.    Dispense:  2 mL    Refill:  5   tirzepatide (ZEPBOUND) 2.5 MG/0.5ML injection vial    Sig: Inject 2.5 mg into the skin once a week.   HYDROcodone -acetaminophen  (NORCO) 10-325 MG tablet    Sig: Take 1 or 1.5 tab QID prn    Dispense:  150 tablet    Refill:  0    Code: M54.50   lisdexamfetamine (VYVANSE ) 50 MG capsule    Sig: Take 1 capsule (50 mg total) by mouth daily.    Dispense:  30 capsule    Refill:  0    Please fill on or after 07/07/24   diazepam  (VALIUM ) 5 MG tablet    Sig: Take 1-2 tablets 1 hour before dental work. Have a driver    Dispense:  6 tablet    Refill:  1      Follow-up: Return in about 3 months (around 10/07/2024) for a follow-up visit.  Marolyn Noel, MD

## 2024-07-07 NOTE — Assessment & Plan Note (Signed)
 Worse LBP irrad to the R lateral lower leg and the foot. Pain is severe at 10+/10. On Norco 1.5 tab 10/325 pain is 5/10. Worse w/walking.SABRASABRA

## 2024-07-07 NOTE — Assessment & Plan Note (Signed)
 BMI 36 Stop drinking Coke (6 q day)! Zepbound Rx

## 2024-07-07 NOTE — Assessment & Plan Note (Addendum)
 Worse LBP irrad to the R lateral lower leg and the foot. Pain is severe at 10+/10. On Norco 1.5 tab 10/325 pain is 5/10. Worse w/walking...  Potential benefits of a long term opioids use as well as potential risks (i.e. addiction risk, apnea etc) and complications (i.e. Somnolence, constipation and others) were explained to the patient and were aknowledged. Will ref to [facility] - Dr KATHEE Sharps

## 2024-07-07 NOTE — Assessment & Plan Note (Signed)
 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

## 2024-07-08 ENCOUNTER — Other Ambulatory Visit (HOSPITAL_COMMUNITY): Payer: Self-pay

## 2024-07-09 ENCOUNTER — Other Ambulatory Visit (HOSPITAL_COMMUNITY): Payer: Self-pay

## 2024-07-09 ENCOUNTER — Telehealth: Payer: Self-pay

## 2024-07-09 NOTE — Telephone Encounter (Signed)
 Pharmacy Patient Advocate Encounter  Received notification from [employer] that Prior Authorization for Zepbound 2.5MG /0.5ML pen-injectors  has been APPROVED from 07/09/24 to 01/07/25. Ran test claim, Copay is $24.99. This test claim was processed through [facility]- copay amounts may vary at other pharmacies due to pharmacy/plan contracts, or as the patient moves through the different stages of their insurance plan.   PA #/Case ID/Reference #: [id number]

## 2024-07-09 NOTE — Telephone Encounter (Signed)
 Pharmacy Patient Advocate Encounter   Received notification from Onbase that prior authorization for Zepbound 2.5MG /0.5ML pen-injectors is required/requested.   Insurance verification completed.   The patient is insured through [facility].   Per test claim: PA required; PA submitted to above mentioned insurance via Latent Key/confirmation #/EOC [id number] Status is pending

## 2024-07-16 MED ORDER — TIRZEPATIDE-WEIGHT MANAGEMENT 2.5 MG/0.5ML ~~LOC~~ SOLN: 2.5000 mg | SUBCUTANEOUS | 3 refills | Status: DC

## 2024-07-16 NOTE — Telephone Encounter (Signed)
 Okay.  Thanks.

## 2024-07-16 NOTE — Addendum Note (Signed)
 Addended by: Jacia Sickman V on: 07/16/2024 09:07 AM   Modules accepted: Orders

## 2024-08-13 ENCOUNTER — Other Ambulatory Visit: Payer: Self-pay | Admitting: Internal Medicine

## 2024-09-08 ENCOUNTER — Other Ambulatory Visit (HOSPITAL_COMMUNITY): Payer: Self-pay

## 2024-09-08 ENCOUNTER — Telehealth: Payer: Self-pay

## 2024-09-08 NOTE — Telephone Encounter (Addendum)
 Pharmacy Patient Advocate Encounter   Received notification from Onbase that prior authorization for ZEPBOUND  2.5MG /0.5ML is required/requested.   Insurance verification completed.   The patient is insured through [facility].   Per test claim: Insurance will only cover 2 fills of the 2.5mg  then patient must titrate up to the next dose. Ran test claim for Zepbound  5mg , received a paid claim. Please titrate up to the next dose. If patient cannot go up to the next dose, please provide clinical reasons to submit for PA.

## 2024-09-16 ENCOUNTER — Other Ambulatory Visit: Payer: Self-pay | Admitting: Internal Medicine

## 2024-10-18 ENCOUNTER — Ambulatory Visit: Admitting: Internal Medicine

## 2024-10-18 ENCOUNTER — Encounter: Payer: Self-pay | Admitting: Internal Medicine

## 2024-10-18 DIAGNOSIS — G8929 Other chronic pain: Secondary | ICD-10-CM

## 2024-10-18 DIAGNOSIS — F3341 Major depressive disorder, recurrent, in partial remission: Secondary | ICD-10-CM

## 2024-10-18 DIAGNOSIS — F909 Attention-deficit hyperactivity disorder, unspecified type: Secondary | ICD-10-CM | POA: Diagnosis not present

## 2024-10-18 DIAGNOSIS — Z6833 Body mass index (BMI) 33.0-33.9, adult: Secondary | ICD-10-CM | POA: Diagnosis not present

## 2024-10-18 DIAGNOSIS — M5440 Lumbago with sciatica, unspecified side: Secondary | ICD-10-CM

## 2024-10-18 DIAGNOSIS — E669 Obesity, unspecified: Secondary | ICD-10-CM | POA: Diagnosis not present

## 2024-10-18 MED ORDER — LISDEXAMFETAMINE DIMESYLATE 50 MG PO CAPS: 50.0000 mg | ORAL_CAPSULE | Freq: Every day | ORAL | 0 refills | Status: AC

## 2024-10-18 MED ORDER — VYVANSE 50 MG PO CAPS: 50.0000 mg | ORAL_CAPSULE | ORAL | 0 refills | Status: AC

## 2024-10-18 MED ORDER — VYVANSE 50 MG PO CAPS: 50.0000 mg | ORAL_CAPSULE | Freq: Every day | ORAL | 0 refills | Status: AC

## 2024-10-18 MED ORDER — TIRZEPATIDE-WEIGHT MANAGEMENT 5 MG/0.5ML ~~LOC~~ SOLN: 5.0000 mg | SUBCUTANEOUS | 5 refills | Status: AC

## 2024-10-18 MED ORDER — DESVENLAFAXINE SUCCINATE ER 100 MG PO TB24: 100.0000 mg | ORAL_TABLET | Freq: Every day | ORAL | 3 refills | Status: AC

## 2024-10-18 MED ORDER — HYDROCODONE-ACETAMINOPHEN 10-325 MG PO TABS: ORAL_TABLET | ORAL | 0 refills | Status: AC

## 2024-10-18 NOTE — Progress Notes (Signed)
 "  Subjective:  Patient ID: Tamara Cross, female    DOB: Dec 16, 1965  Age: 59 y.o. MRN: [id number]  CC: Medical Management of Chronic Issues (Medication follow up. Chronic lower back pain)   HPI Tamara Cross presents for LBP, ADD, obesity  Outpatient Medications Prior to Visit  Medication Sig Dispense Refill   acetaminophen  (TYLENOL ) 500 MG tablet Take 1,000 mg by mouth every 6 (six) hours as needed for headache.     albuterol  (VENTOLIN  HFA) 108 (90 Base) MCG/ACT inhaler Inhale 1-2 puffs into the lungs every 6 (six) hours as needed for wheezing or shortness of breath. 3 each 3   budesonide -formoterol  (SYMBICORT ) 160-4.5 MCG/ACT inhaler Inhale 2 puffs into the lungs 2 (two) times daily. 3 each 3   cetirizine (ZYRTEC) 10 MG tablet Take 10 mg by mouth daily as needed for allergies.     diazepam  (VALIUM ) 5 MG tablet Take 1-2 tablets 1 hour before dental work. Have a driver 6 tablet 1   ibuprofen  (ADVIL ) 200 MG tablet Take 400 mg by mouth every 6 (six) hours as needed (Back pain).     nitrofurantoin , macrocrystal-monohydrate, (MACROBID ) 100 MG capsule Take 1 capsule (100 mg total) by mouth 2 (two) times daily. 14 capsule 0   pantoprazole  (PROTONIX ) 40 MG tablet Take 1 tablet (40 mg total) by mouth 2 (two) times daily. 60 tablet 5   valACYclovir  (VALTREX ) 500 MG tablet Take 1 tablet (500 mg total) by mouth daily. Increase to bid prn with outbreak 180 tablet 1   desvenlafaxine  (PRISTIQ ) 100 MG 24 hr tablet Take 1 tablet (100 mg total) by mouth daily. 90 tablet 3   HYDROcodone -acetaminophen  (NORCO) 10-325 MG tablet TAKE 1 TO 1 AND 1/2 TABLETS BY MOUTH 4 TIMES DAILY AS NEEDED 150 tablet 0   lisdexamfetamine  (VYVANSE ) 50 MG capsule Take 1 capsule (50 mg total) by mouth daily. 30 capsule 0   tirzepatide  (ZEPBOUND ) 2.5 MG/0.5ML injection vial Inject 2.5 mg into the skin once a week. 2 mL 3   VYVANSE  50 MG capsule Take 1 capsule (50 mg total) by mouth daily. 30 capsule 0   Soft Lens Products (SENSITIVE  EYES SALINE) SOLN Place 1 drop into both eyes daily. Saline eye lub (Patient not taking: Reported on 10/18/2024)     triamcinolone  cream (KENALOG ) 0.1 % Apply 1 Application topically 2 (two) times daily. (Patient not taking: Reported on 10/18/2024) 30 g 0   VYVANSE  50 MG capsule Take 1 capsule (50 mg total) by mouth every morning. (Patient not taking: Reported on 10/18/2024) 30 capsule 0   Facility-Administered Medications Prior to Visit  Medication Dose Route Frequency Provider Last Rate Last Admin   gadopentetate dimeglumine  (MAGNEVIST ) injection 18 mL  18 mL Intravenous Once PRN Penumalli, Vikram R, MD        ROS: Review of Systems  Constitutional:  Negative for activity change, appetite change, chills, fatigue and unexpected weight change.  HENT:  Negative for congestion, mouth sores and sinus pressure.   Eyes:  Negative for visual disturbance.  Respiratory:  Negative for cough and chest tightness.   Gastrointestinal:  Negative for abdominal pain and nausea.  Genitourinary:  Negative for difficulty urinating, frequency and vaginal pain.  Musculoskeletal:  Positive for arthralgias, back pain and gait problem.  Skin:  Negative for pallor and rash.  Neurological:  Negative for dizziness, tremors, weakness, numbness and headaches.  Psychiatric/Behavioral:  Negative for confusion and sleep disturbance.     Objective:  BP 100/64  Pulse (!) 117   Ht 5' 4 (1.626 m)   Wt 195 lb 3.2 oz (88.5 kg)   LMP 04/11/2012 Comment: ablation  SpO2 96%   BMI 33.51 kg/m   BP Readings from Last 3 Encounters:  10/18/24 100/64  07/07/24 124/72  05/27/24 114/64    Wt Readings from Last 3 Encounters:  10/18/24 195 lb 3.2 oz (88.5 kg)  07/07/24 212 lb 3.2 oz (96.3 kg)  05/27/24 212 lb (96.2 kg)    Physical Exam Constitutional:      General: She is not in acute distress.    Appearance: She is well-developed. She is obese.  HENT:     Head: Normocephalic.     Right Ear: External ear normal.      Left Ear: External ear normal.     Nose: Nose normal.  Eyes:     General:        Right eye: No discharge.        Left eye: No discharge.     Conjunctiva/sclera: Conjunctivae normal.     Pupils: Pupils are equal, round, and reactive to light.  Neck:     Thyroid : No thyromegaly.     Vascular: No JVD.     Trachea: No tracheal deviation.  Cardiovascular:     Rate and Rhythm: Normal rate and regular rhythm.     Heart sounds: Normal heart sounds.  Pulmonary:     Effort: No respiratory distress.     Breath sounds: No stridor. No wheezing.  Abdominal:     General: Bowel sounds are normal. There is no distension.     Palpations: Abdomen is soft. There is no mass.     Tenderness: There is no abdominal tenderness. There is no guarding or rebound.  Musculoskeletal:        General: No tenderness.     Cervical back: Normal range of motion and neck supple. No rigidity.  Lymphadenopathy:     Cervical: No cervical adenopathy.  Skin:    Findings: No erythema or rash.  Neurological:     Cranial Nerves: No cranial nerve deficit.     Motor: No abnormal muscle tone.     Coordination: Coordination normal.     Deep Tendon Reflexes: Reflexes normal.  Psychiatric:        Behavior: Behavior normal.        Thought Content: Thought content normal.        Judgment: Judgment normal.   Lumbar spine with pain on range of motion  Lab Results  Component Value Date   WBC 9.9 05/27/2024   HGB 15.0 05/27/2024   HCT 44.9 05/27/2024   PLT 244.0 05/27/2024   GLUCOSE 97 05/27/2024   CHOL 228 (H) 05/27/2024   TRIG 109.0 05/27/2024   HDL 66.10 05/27/2024   LDLCALC 140 (H) 05/27/2024   ALT 22 05/27/2024   AST 19 05/27/2024   NA 141 05/27/2024   K 4.7 05/27/2024   CL 102 05/27/2024   CREATININE 0.59 05/27/2024   BUN 10 05/27/2024   CO2 30 05/27/2024   TSH 5.82 (H) 05/27/2024   HGBA1C 6.2 08/07/2022    No results found.  Assessment & Plan:   Problem List Items Addressed This Visit     Obesity  (BMI 30-39.9) - Primary   Discussed.  Zepbound  vials prescription was provided      Relevant Medications   tirzepatide  5 MG/0.5ML injection vial   lisdexamfetamine  (VYVANSE ) 50 MG capsule   VYVANSE  50 MG capsule  VYVANSE  50 MG capsule   Depression   On Pristiq       Relevant Medications   desvenlafaxine  (PRISTIQ ) 100 MG 24 hr tablet   Low back pain   Stable LBP irrad to the R lateral lower leg and the foot. Pain is severe at 10+/10. On Norco 1.5 tab 10/325 pain is 5/10. Worse w/walking...  Potential benefits of a long term opioids use as well as potential risks (i.e. addiction risk, apnea etc) and complications (i.e. Somnolence, constipation and others) were explained to the patient and were aknowledged. Will ref to [facility] - Dr KATHEE Sharps      Relevant Medications   HYDROcodone -acetaminophen  (NORCO) 10-325 MG tablet   ADHD (attention deficit hyperactivity disorder)   On Rx -Vyvance.  Stable  Potential benefits of a long term stimulant use as well as potential risks  and complications were explained to the patient and were aknowledged.         Meds ordered this encounter  Medications   tirzepatide  5 MG/0.5ML injection vial    Sig: Inject 5 mg into the skin once a week.    Dispense:  2 mL    Refill:  5   HYDROcodone -acetaminophen  (NORCO) 10-325 MG tablet    Sig: TAKE 1 TO 1 AND 1/2 TABLETS BY MOUTH 4 TIMES DAILY AS NEEDED    Dispense:  150 tablet    Refill:  0   lisdexamfetamine  (VYVANSE ) 50 MG capsule    Sig: Take 1 capsule (50 mg total) by mouth daily.    Dispense:  30 capsule    Refill:  0    Please fill on or after 10/18/24 Code:F90.9   VYVANSE  50 MG capsule    Sig: Take 1 capsule (50 mg total) by mouth every morning.    Dispense:  30 capsule    Refill:  0    Please fill on or after 11/18/24 Code:F90.9   VYVANSE  50 MG capsule    Sig: Take 1 capsule (50 mg total) by mouth daily.    Dispense:  30 capsule    Refill:  0    Please fill on or after 12/18/24 Code:F90.9    desvenlafaxine  (PRISTIQ ) 100 MG 24 hr tablet    Sig: Take 1 tablet (100 mg total) by mouth daily.    Dispense:  90 tablet    Refill:  3      Follow-up: Return in about 3 months (around 01/16/2025) for a follow-up visit.  Marolyn Noel, MD "

## 2024-10-18 NOTE — Patient Instructions (Signed)
 Recent large-scale observational studies provide strong evidence that the shingles vaccine is associated with a reduced risk of developing dementia. It may also help slow cognitive decline in individuals who already have dementia.  Key Findings from Recent Research Multiple natural experiment studies, which leveraged unique vaccination policies to minimize bias, have consistently found a protective link:  Reduced Risk: One large study, published in [place], analyzed health records from over 280,000 older adults in [place] and found that those who received the live-attenuated shingles vaccine (Zostavax, now discontinued in the [place] ) were approximately 20% less likely to develop dementia over a seven-year follow-up period. Slower Progression: A follow-up study published in Cell indicated that for those already living with dementia, the vaccine appeared to slow the progression of the disease and reduced dementia-related deaths by nearly half over nine years. Vascular Dementia Protection: Other research presented at [facility] 2025 indicated that the vaccine lowered the risk of vascular dementia by 50%. Newer Vaccine: A separate study published in [employer] suggested that the newer, more effective recombinant vaccine (Shingrix, currently used in the [place] ) also offers significant protection against dementia, with an association of lower risk than the older vaccine type. Stronger Effect in Women: Several studies noted that the protective effect against dementia was more pronounced in women than in men, possibly due to differences in immune responses or dementia pathogenesis.  Why Might the Vaccine Help? Researchers are still working to determine the exact mechanism, but current theories center on the idea that preventing the shingles virus (varicella-zoster) from reactivating helps protect the brain:  Reduced Inflammation: The most likely explanation is that the vaccine prevents shingles infections and subsequent  inflammation in the nervous system, which is a known risk factor for neurodegenerative diseases like dementia. Immune System Modulation: It's also possible that the vaccine boosts the immune system more broadly, helping the body combat other processes related to cognitive decline.  Important Considerations Observational Data: While the evidence is strong, these findings primarily come from observational studies, which show an association, not a definitive cause-and-effect relationship. A large-scale randomized controlled trial is the gold standard for conclusive proof, but such trials are difficult to conduct for dementia due to the time and cost involved. Public Health Recommendation: The [place]. Centers for Disease Control and Prevention (CDC) already recommends the two-dose Shingrix vaccine for all healthy adults aged 14 and older primarily to prevent shingles and its painful complications. The potential added benefit for dementia prevention provides another compelling reason to get vaccinated.   AMLACTIN lotion

## 2024-10-25 NOTE — Assessment & Plan Note (Signed)
On Pristiq 

## 2024-10-25 NOTE — Assessment & Plan Note (Signed)
 Discussed.  Zepbound  vials prescription was provided

## 2024-10-25 NOTE — Assessment & Plan Note (Signed)
 Stable LBP irrad to the R lateral lower leg and the foot. Pain is severe at 10+/10. On Norco 1.5 tab 10/325 pain is 5/10. Worse w/walking...  Potential benefits of a long term opioids use as well as potential risks (i.e. addiction risk, apnea etc) and complications (i.e. Somnolence, constipation and others) were explained to the patient and were aknowledged. Will ref to [facility] - Dr KATHEE Sharps

## 2024-10-25 NOTE — Assessment & Plan Note (Signed)
 On Rx -Vyvance.  Stable  Potential benefits of a long term stimulant use as well as potential risks  and complications were explained to the patient and were aknowledged.
